# Patient Record
Sex: Female | Born: 1980 | Race: Black or African American | Hispanic: No | Marital: Married | Smoking: Never smoker
Health system: Southern US, Community
[De-identification: ages and names within clinical notes are randomized; demographics above are authoritative.]

## PROBLEM LIST (undated history)

## (undated) DIAGNOSIS — N912 Amenorrhea, unspecified: Secondary | ICD-10-CM

## (undated) DIAGNOSIS — G43909 Migraine, unspecified, not intractable, without status migrainosus: Secondary | ICD-10-CM

## (undated) DIAGNOSIS — L0293 Carbuncle, unspecified: Secondary | ICD-10-CM

## (undated) DIAGNOSIS — E785 Hyperlipidemia, unspecified: Secondary | ICD-10-CM

## (undated) DIAGNOSIS — Z79899 Other long term (current) drug therapy: Secondary | ICD-10-CM

## (undated) DIAGNOSIS — L0292 Furuncle, unspecified: Secondary | ICD-10-CM

## (undated) DIAGNOSIS — I10 Essential (primary) hypertension: Secondary | ICD-10-CM

## (undated) DIAGNOSIS — R030 Elevated blood-pressure reading, without diagnosis of hypertension: Secondary | ICD-10-CM

## (undated) DIAGNOSIS — R51 Headache: Secondary | ICD-10-CM

## (undated) DIAGNOSIS — Z0000 Encounter for general adult medical examination without abnormal findings: Secondary | ICD-10-CM

## (undated) DIAGNOSIS — B20 Human immunodeficiency virus [HIV] disease: Secondary | ICD-10-CM

## (undated) DIAGNOSIS — A539 Syphilis, unspecified: Secondary | ICD-10-CM

## (undated) DIAGNOSIS — Z124 Encounter for screening for malignant neoplasm of cervix: Secondary | ICD-10-CM

## (undated) DIAGNOSIS — R7303 Prediabetes: Secondary | ICD-10-CM

## (undated) HISTORY — DX: Essential (primary) hypertension: I10

## (undated) HISTORY — DX: Encounter for general adult medical examination without abnormal findings: Z00.00

## (undated) HISTORY — DX: Encounter for screening for malignant neoplasm of cervix: Z12.4

## (undated) HISTORY — DX: Amenorrhea, unspecified: N91.2

## (undated) HISTORY — DX: Prediabetes: R73.03

## (undated) HISTORY — DX: Other long term (current) drug therapy: Z79.899

## (undated) HISTORY — DX: Syphilis, unspecified: A53.9

## (undated) HISTORY — DX: Migraine, unspecified, not intractable, without status migrainosus: G43.909

## (undated) HISTORY — DX: Furuncle, unspecified: L02.92

## (undated) HISTORY — DX: Furuncle, unspecified: L02.93

## (undated) HISTORY — PX: NO PAST SURGERIES: SHX2092

## (undated) HISTORY — DX: Hyperlipidemia, unspecified: E78.5

## (undated) HISTORY — DX: Headache: R51

## (undated) HISTORY — DX: Human immunodeficiency virus (HIV) disease: B20

## (undated) HISTORY — DX: Elevated blood-pressure reading, without diagnosis of hypertension: R03.0

---

## 2003-06-11 ENCOUNTER — Emergency Department (HOSPITAL_COMMUNITY): Admission: EM | Admit: 2003-06-11 | Discharge: 2003-06-12 | Payer: Self-pay | Admitting: Emergency Medicine

## 2003-06-14 ENCOUNTER — Emergency Department (HOSPITAL_COMMUNITY): Admission: AD | Admit: 2003-06-14 | Discharge: 2003-06-14 | Payer: Self-pay | Admitting: Family Medicine

## 2004-03-28 ENCOUNTER — Ambulatory Visit: Payer: Self-pay | Admitting: Family Medicine

## 2004-03-29 ENCOUNTER — Ambulatory Visit: Payer: Self-pay | Admitting: Family Medicine

## 2004-04-09 ENCOUNTER — Ambulatory Visit: Payer: Self-pay | Admitting: Internal Medicine

## 2004-04-16 ENCOUNTER — Ambulatory Visit: Payer: Self-pay | Admitting: Internal Medicine

## 2004-06-24 ENCOUNTER — Ambulatory Visit: Payer: Self-pay | Admitting: Family Medicine

## 2004-07-04 ENCOUNTER — Ambulatory Visit: Payer: Self-pay | Admitting: Family Medicine

## 2004-08-08 ENCOUNTER — Ambulatory Visit: Payer: Self-pay | Admitting: *Deleted

## 2004-10-03 ENCOUNTER — Ambulatory Visit: Payer: Self-pay | Admitting: Family Medicine

## 2004-10-28 ENCOUNTER — Ambulatory Visit: Payer: Self-pay | Admitting: Family Medicine

## 2004-11-07 ENCOUNTER — Ambulatory Visit: Payer: Self-pay | Admitting: Family Medicine

## 2005-02-11 IMAGING — CT CT ABDOMEN W/O CM
1 series · 16 of 32 positions shown, 20 images · non-contrast
Comparison: none

CLINICAL DATA: Abdominal and pelvic pain. Flank pain.  Hematuria.  Nausea and vomiting.
 CT ABDOMEN AND PELVIS WITHOUT CONTRAST
TECHNIQUE: Multidetector helical CT scanning obtained from the upper abdomen through the remainder of the abdomen and pelvis.

[Series 2: renal stone · axial · 0.78mm/px · z∈[-380,-30]mm · 16 of 77 slices shown, 20 images]
[im 5/77  soft-tissue]
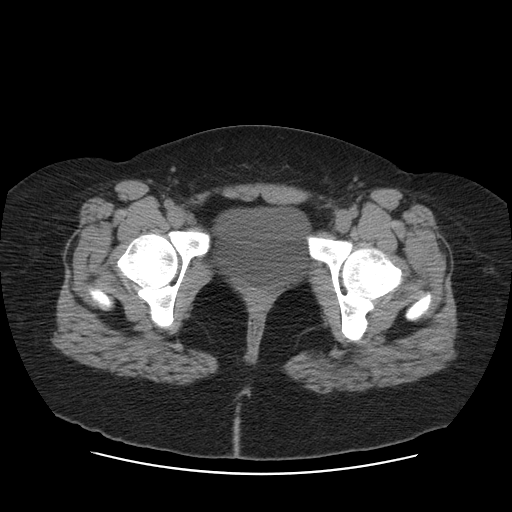
[im 5/77  bone]
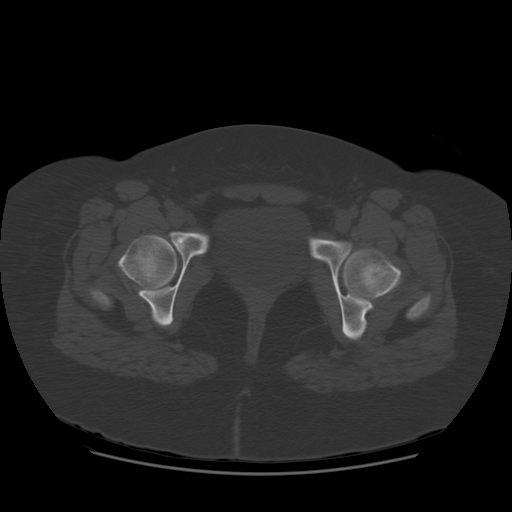
[im 10/77  soft-tissue]
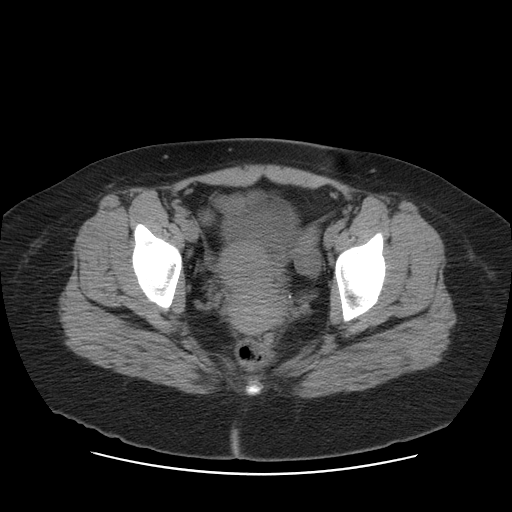
[im 15/77  soft-tissue]
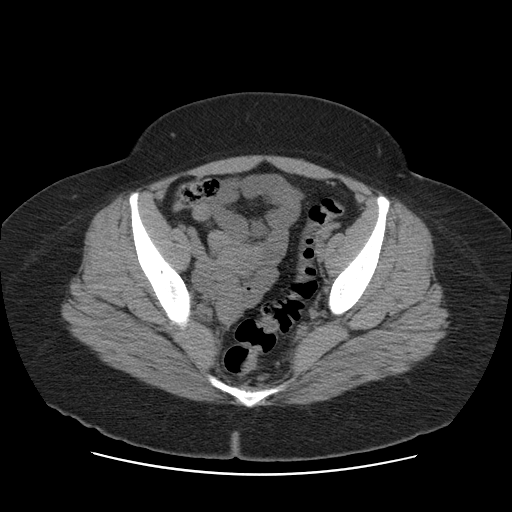
[im 20/77  soft-tissue]
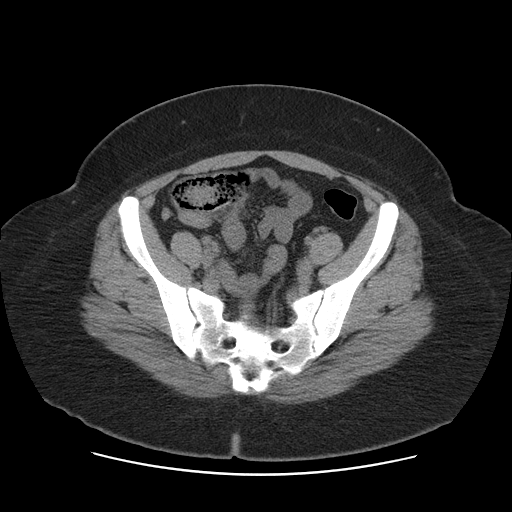
[im 25/77  soft-tissue]
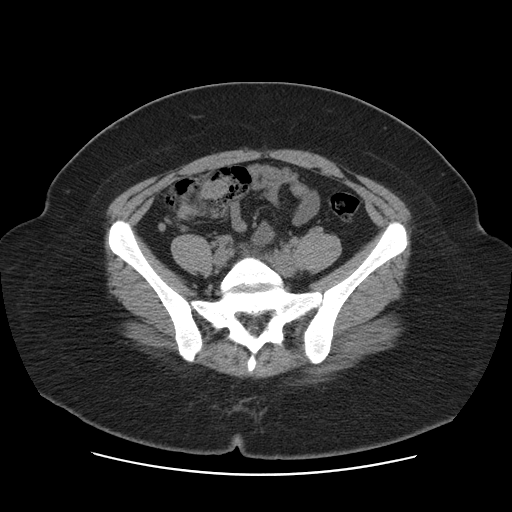
[im 30/77  soft-tissue]
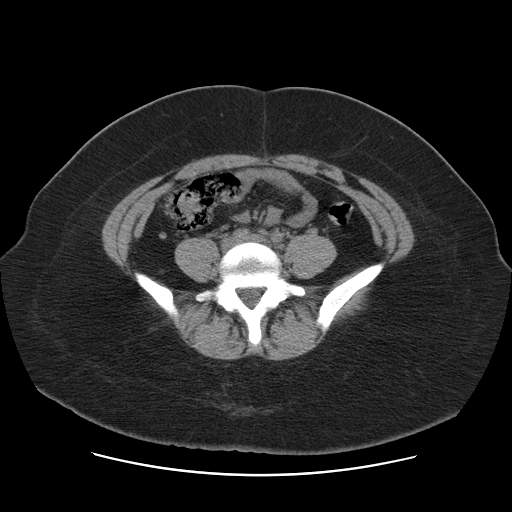
[im 35/77  soft-tissue]
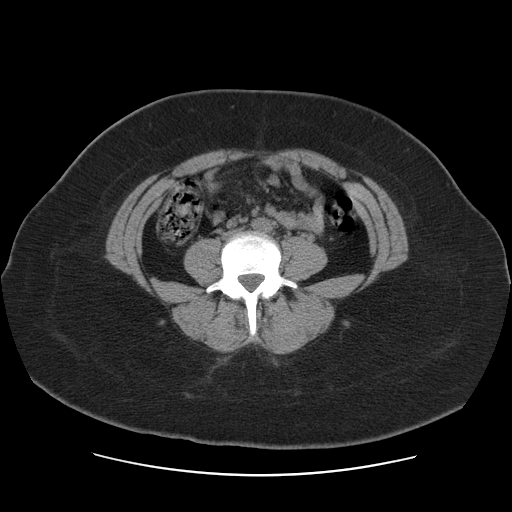
[im 42/77  soft-tissue]
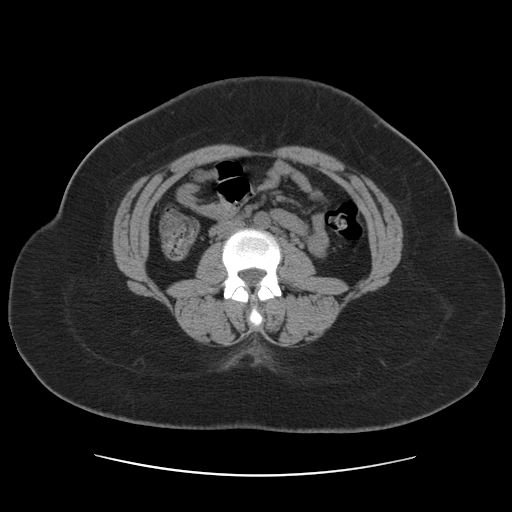
[im 47/77  soft-tissue]
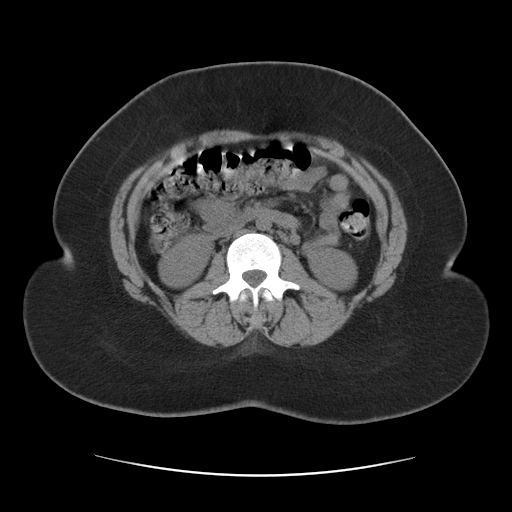
[im 47/77  bone]
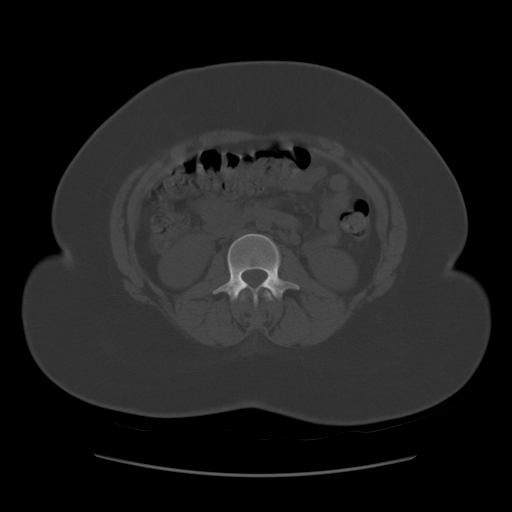
[im 52/77  soft-tissue]
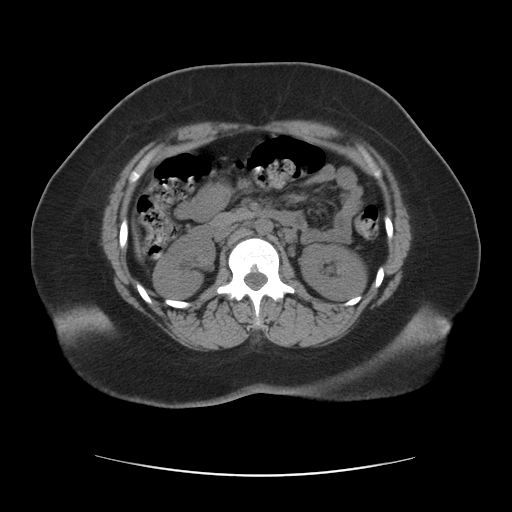
[im 57/77  soft-tissue]
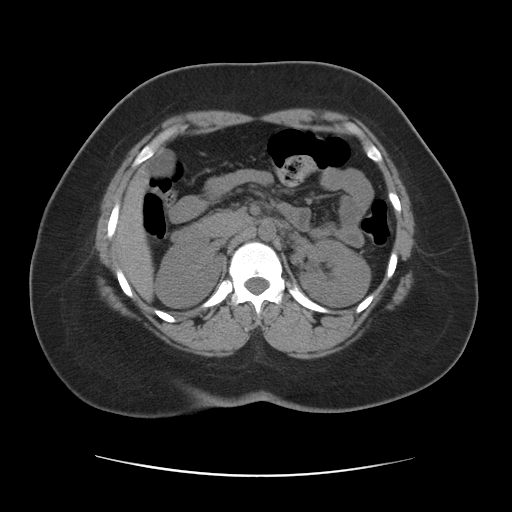
[im 62/77  soft-tissue]
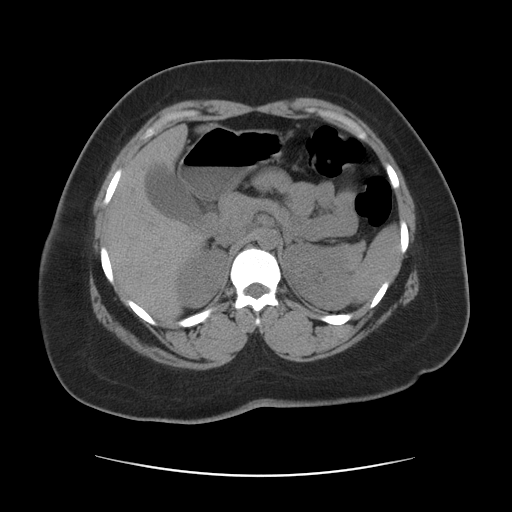
[im 67/77  soft-tissue]
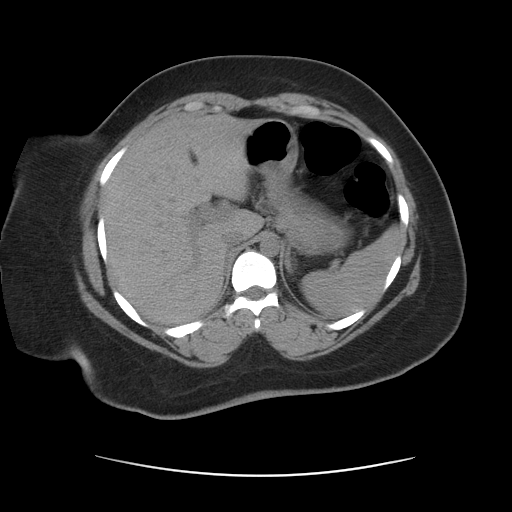
[im 67/77  lung]
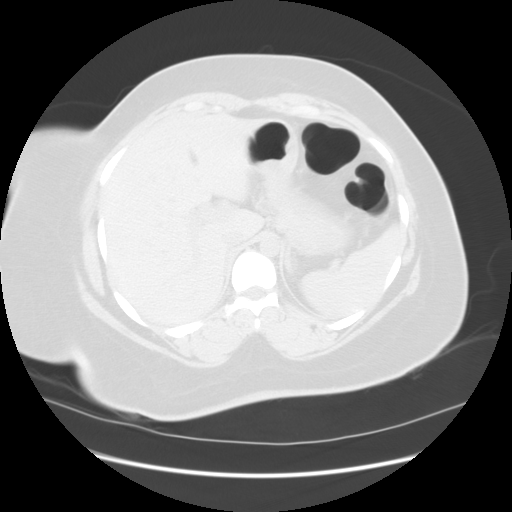
[im 69/77  lung]
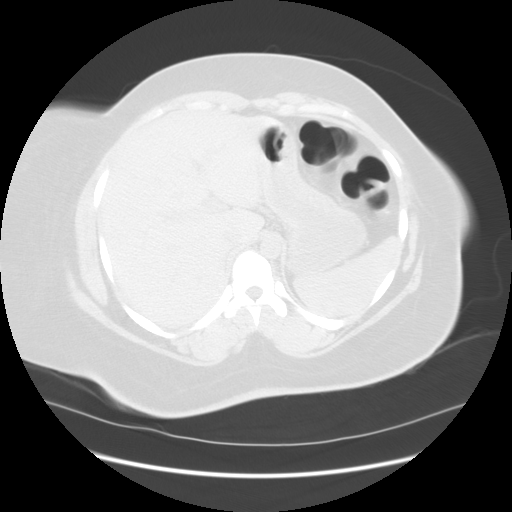
[im 72/77  soft-tissue]
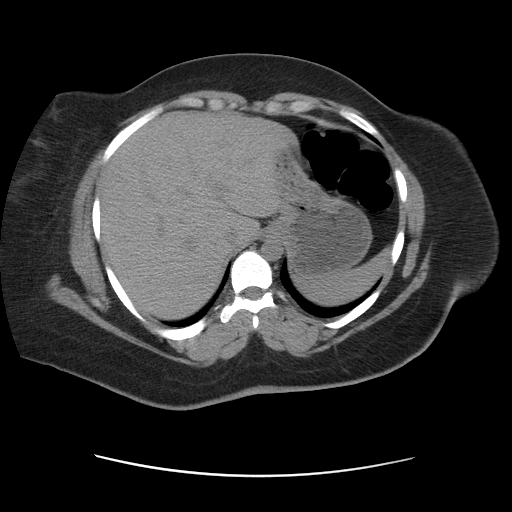
[im 72/77  lung]
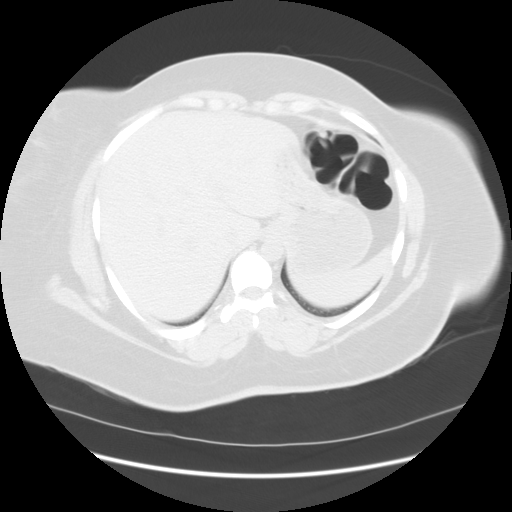
[im 74/77  lung]
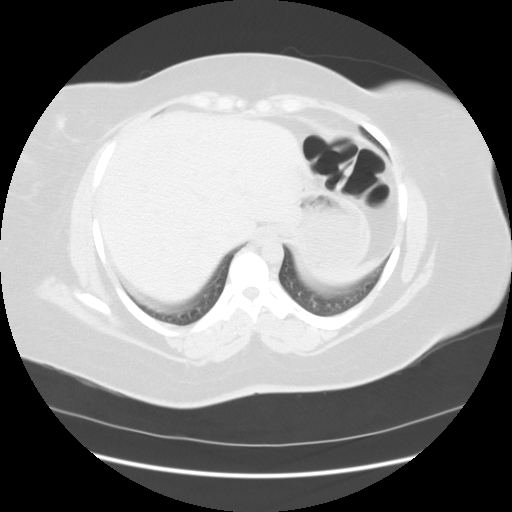

[16 of 32 positions shown; findings below may reference images not displayed]

FINDINGS: CT SCAN OF THE ABDOMEN
 The visualized liver and spleen are unremarkable.  Gallbladder, pancreas, and adrenal glands are unremarkable.  Kidneys are unremarkable.  Please note that parenchymal lesions within the solid viscera may be missed as intravenous contrast was not administered.  There is no evidence of free fluid, enlarged lymph nodes or abdominal aortic aneurysm.  Visualized bowel unremarkable.
 IMPRESSION
 No acute abnormality.
 CT SCAN OF THE PELVIS
 No free fluid, enlarged lymph nodes or abdominal aortic aneurysm.  No definite urinary calculi.
 IMPRESSION
 No acute abnormality.

## 2005-03-20 ENCOUNTER — Ambulatory Visit: Payer: Self-pay | Admitting: Family Medicine

## 2006-01-28 ENCOUNTER — Emergency Department (HOSPITAL_COMMUNITY): Admission: EM | Admit: 2006-01-28 | Discharge: 2006-01-28 | Payer: Self-pay | Admitting: Emergency Medicine

## 2006-02-07 ENCOUNTER — Emergency Department (HOSPITAL_COMMUNITY): Admission: EM | Admit: 2006-02-07 | Discharge: 2006-02-08 | Payer: Self-pay | Admitting: Emergency Medicine

## 2006-02-12 ENCOUNTER — Ambulatory Visit: Payer: Self-pay | Admitting: Family Medicine

## 2006-03-10 ENCOUNTER — Emergency Department (HOSPITAL_COMMUNITY): Admission: EM | Admit: 2006-03-10 | Discharge: 2006-03-11 | Payer: Self-pay | Admitting: Emergency Medicine

## 2006-06-15 ENCOUNTER — Emergency Department (HOSPITAL_COMMUNITY): Admission: EM | Admit: 2006-06-15 | Discharge: 2006-06-15 | Payer: Self-pay | Admitting: Family Medicine

## 2006-07-14 DIAGNOSIS — B20 Human immunodeficiency virus [HIV] disease: Secondary | ICD-10-CM

## 2006-07-14 DIAGNOSIS — Z21 Asymptomatic human immunodeficiency virus [HIV] infection status: Secondary | ICD-10-CM

## 2006-07-14 HISTORY — DX: Human immunodeficiency virus (HIV) disease: B20

## 2006-07-14 HISTORY — DX: Asymptomatic human immunodeficiency virus (hiv) infection status: Z21

## 2006-08-27 ENCOUNTER — Ambulatory Visit: Payer: Self-pay | Admitting: Internal Medicine

## 2006-08-27 ENCOUNTER — Encounter: Admission: RE | Admit: 2006-08-27 | Discharge: 2006-08-27 | Payer: Self-pay | Admitting: Internal Medicine

## 2006-08-27 LAB — CONVERTED CEMR LAB
ALT: 24 units/L (ref 0–35)
AST: 25 units/L (ref 0–37)
Albumin: 4 g/dL (ref 3.5–5.2)
Alkaline Phosphatase: 64 units/L (ref 39–117)
BUN: 12 mg/dL (ref 6–23)
Basophils Absolute: 0 10*3/uL (ref 0.0–0.1)
Basophils Relative: 1 % (ref 0–1)
Bilirubin Urine: NEGATIVE
CO2: 26 meq/L (ref 19–32)
Calcium: 9.3 mg/dL (ref 8.4–10.5)
Chlamydia, Swab/Urine, PCR: NEGATIVE
Chloride: 103 meq/L (ref 96–112)
Creatinine, Ser: 0.69 mg/dL (ref 0.40–1.20)
Eosinophils Absolute: 0 10*3/uL (ref 0.0–0.7)
Eosinophils Relative: 1 % (ref 0–5)
GC Probe Amp, Urine: NEGATIVE
Glucose, Bld: 75 mg/dL (ref 70–99)
HCT: 37.2 % (ref 36.0–46.0)
HCV Ab: NEGATIVE
HIV 1 RNA Quant: 75600 copies/mL — ABNORMAL HIGH (ref <50)
HIV-1 RNA Quant, Log: 4.88 — ABNORMAL HIGH (ref <1.70)
HIV-1 antibody: POSITIVE — AB
HIV-2 Ab: UNDETERMINED — AB
HIV: REACTIVE
Hemoglobin, Urine: NEGATIVE
Hemoglobin: 11.9 g/dL — ABNORMAL LOW (ref 12.0–15.0)
Hep B Core Total Ab: NEGATIVE
Hep B S Ab: NEGATIVE
Hepatitis B Surface Ag: NEGATIVE
Ketones, ur: NEGATIVE mg/dL
Leukocytes, UA: NEGATIVE
Lymphocytes Relative: 54 % — ABNORMAL HIGH (ref 12–46)
Lymphs Abs: 1.5 10*3/uL (ref 0.7–3.3)
MCHC: 32 g/dL (ref 30.0–36.0)
MCV: 89.9 fL (ref 78.0–100.0)
Monocytes Absolute: 0.3 10*3/uL (ref 0.2–0.7)
Monocytes Relative: 10 % (ref 3–11)
Neutro Abs: 0.9 10*3/uL — ABNORMAL LOW (ref 1.7–7.7)
Neutrophils Relative %: 35 % — ABNORMAL LOW (ref 43–77)
Nitrite: NEGATIVE
Platelets: 247 10*3/uL (ref 150–400)
Potassium: 4 meq/L (ref 3.5–5.3)
Protein, ur: 30 mg/dL — AB
RBC: 4.14 M/uL (ref 3.87–5.11)
RDW: 13.4 % (ref 11.5–14.0)
Sodium: 136 meq/L (ref 135–145)
Specific Gravity, Urine: 1.016 (ref 1.005–1.03)
Total Bilirubin: 0.3 mg/dL (ref 0.3–1.2)
Total Protein: 7.8 g/dL (ref 6.0–8.3)
Urine Glucose: NEGATIVE mg/dL
Urobilinogen, UA: 0.2 (ref 0.0–1.0)
WBC: 2.7 10*3/uL — ABNORMAL LOW (ref 4.0–10.5)
pH: 6.5 (ref 5.0–8.0)

## 2006-08-29 ENCOUNTER — Emergency Department (HOSPITAL_COMMUNITY): Admission: EM | Admit: 2006-08-29 | Discharge: 2006-08-29 | Payer: Self-pay | Admitting: Family Medicine

## 2006-09-03 DIAGNOSIS — B20 Human immunodeficiency virus [HIV] disease: Secondary | ICD-10-CM

## 2006-09-11 ENCOUNTER — Telehealth (INDEPENDENT_AMBULATORY_CARE_PROVIDER_SITE_OTHER): Payer: Self-pay | Admitting: *Deleted

## 2006-09-11 ENCOUNTER — Encounter (INDEPENDENT_AMBULATORY_CARE_PROVIDER_SITE_OTHER): Payer: Self-pay | Admitting: *Deleted

## 2006-09-11 ENCOUNTER — Ambulatory Visit: Payer: Self-pay | Admitting: Internal Medicine

## 2006-09-11 DIAGNOSIS — A539 Syphilis, unspecified: Secondary | ICD-10-CM

## 2006-09-11 LAB — CONVERTED CEMR LAB
HCV Ab: NEGATIVE
Hep B S Ab: NEGATIVE
Hepatitis B Surface Ag: NEGATIVE

## 2006-09-15 ENCOUNTER — Telehealth: Payer: Self-pay

## 2006-10-06 ENCOUNTER — Telehealth: Payer: Self-pay | Admitting: Internal Medicine

## 2006-10-12 ENCOUNTER — Encounter: Admission: RE | Admit: 2006-10-12 | Discharge: 2006-10-12 | Payer: Self-pay | Admitting: Internal Medicine

## 2006-10-12 ENCOUNTER — Ambulatory Visit: Payer: Self-pay | Admitting: Internal Medicine

## 2006-11-03 ENCOUNTER — Telehealth: Payer: Self-pay | Admitting: Internal Medicine

## 2006-11-03 ENCOUNTER — Ambulatory Visit: Payer: Self-pay | Admitting: Internal Medicine

## 2006-11-25 ENCOUNTER — Telehealth: Payer: Self-pay | Admitting: Internal Medicine

## 2006-12-23 ENCOUNTER — Ambulatory Visit: Payer: Self-pay | Admitting: Internal Medicine

## 2006-12-23 ENCOUNTER — Encounter: Admission: RE | Admit: 2006-12-23 | Discharge: 2006-12-23 | Payer: Self-pay | Admitting: Internal Medicine

## 2006-12-23 ENCOUNTER — Telehealth: Payer: Self-pay | Admitting: Internal Medicine

## 2006-12-28 ENCOUNTER — Encounter: Payer: Self-pay | Admitting: Internal Medicine

## 2007-01-04 ENCOUNTER — Encounter (INDEPENDENT_AMBULATORY_CARE_PROVIDER_SITE_OTHER): Payer: Self-pay | Admitting: *Deleted

## 2007-01-05 ENCOUNTER — Encounter (INDEPENDENT_AMBULATORY_CARE_PROVIDER_SITE_OTHER): Payer: Self-pay | Admitting: *Deleted

## 2007-01-07 ENCOUNTER — Telehealth: Payer: Self-pay | Admitting: Internal Medicine

## 2007-01-13 ENCOUNTER — Ambulatory Visit: Payer: Self-pay | Admitting: Internal Medicine

## 2007-02-02 ENCOUNTER — Telehealth: Payer: Self-pay | Admitting: Internal Medicine

## 2007-03-03 ENCOUNTER — Telehealth: Payer: Self-pay | Admitting: Internal Medicine

## 2007-04-06 ENCOUNTER — Telehealth: Payer: Self-pay | Admitting: Internal Medicine

## 2007-05-04 ENCOUNTER — Telehealth (INDEPENDENT_AMBULATORY_CARE_PROVIDER_SITE_OTHER): Payer: Self-pay | Admitting: *Deleted

## 2007-05-04 ENCOUNTER — Telehealth: Payer: Self-pay | Admitting: Internal Medicine

## 2007-06-04 ENCOUNTER — Telehealth: Payer: Self-pay | Admitting: Internal Medicine

## 2007-06-16 ENCOUNTER — Encounter: Admission: RE | Admit: 2007-06-16 | Discharge: 2007-06-16 | Payer: Self-pay | Admitting: Internal Medicine

## 2007-06-16 ENCOUNTER — Ambulatory Visit: Payer: Self-pay | Admitting: Internal Medicine

## 2007-06-16 ENCOUNTER — Encounter (INDEPENDENT_AMBULATORY_CARE_PROVIDER_SITE_OTHER): Payer: Self-pay | Admitting: *Deleted

## 2007-06-16 LAB — CONVERTED CEMR LAB
ALT: 28 units/L (ref 0–35)
AST: 25 units/L (ref 0–37)
Albumin: 4.2 g/dL (ref 3.5–5.2)
Alkaline Phosphatase: 127 units/L — ABNORMAL HIGH (ref 39–117)
BUN: 13 mg/dL (ref 6–23)
Basophils Absolute: 0 10*3/uL (ref 0.0–0.1)
Basophils Relative: 1 % (ref 0–1)
CO2: 26 meq/L (ref 19–32)
Calcium: 9.5 mg/dL (ref 8.4–10.5)
Chloride: 103 meq/L (ref 96–112)
Creatinine, Ser: 0.78 mg/dL (ref 0.40–1.20)
Eosinophils Absolute: 0.1 10*3/uL — ABNORMAL LOW (ref 0.2–0.7)
Eosinophils Relative: 3 % (ref 0–5)
Glucose, Bld: 85 mg/dL (ref 70–99)
HCT: 37.1 % (ref 36.0–46.0)
HIV 1 RNA Quant: 97 copies/mL — ABNORMAL HIGH (ref <50)
HIV-1 RNA Quant, Log: 1.99 — ABNORMAL HIGH (ref <1.70)
Hemoglobin: 12.1 g/dL (ref 12.0–15.0)
Lymphocytes Relative: 45 % (ref 12–46)
Lymphs Abs: 2 10*3/uL (ref 0.7–4.0)
MCHC: 32.6 g/dL (ref 30.0–36.0)
MCV: 90.9 fL (ref 78.0–100.0)
Monocytes Absolute: 0.3 10*3/uL (ref 0.1–1.0)
Monocytes Relative: 7 % (ref 3–12)
Neutro Abs: 2 10*3/uL (ref 1.7–7.7)
Neutrophils Relative %: 44 % (ref 43–77)
Platelets: 335 10*3/uL (ref 150–400)
Potassium: 4.1 meq/L (ref 3.5–5.3)
RBC: 4.08 M/uL (ref 3.87–5.11)
RDW: 13.1 % (ref 11.5–15.5)
Sodium: 140 meq/L (ref 135–145)
Total Bilirubin: 0.2 mg/dL — ABNORMAL LOW (ref 0.3–1.2)
Total Protein: 7.8 g/dL (ref 6.0–8.3)
WBC: 4.4 10*3/uL (ref 4.0–10.5)

## 2007-06-21 ENCOUNTER — Telehealth: Payer: Self-pay | Admitting: Internal Medicine

## 2007-07-05 ENCOUNTER — Telehealth: Payer: Self-pay | Admitting: Internal Medicine

## 2007-07-12 ENCOUNTER — Encounter: Payer: Self-pay | Admitting: Internal Medicine

## 2007-07-14 ENCOUNTER — Telehealth: Payer: Self-pay | Admitting: Internal Medicine

## 2007-07-14 ENCOUNTER — Ambulatory Visit: Payer: Self-pay | Admitting: Internal Medicine

## 2007-07-21 ENCOUNTER — Telehealth: Payer: Self-pay | Admitting: Internal Medicine

## 2007-08-12 ENCOUNTER — Telehealth: Payer: Self-pay | Admitting: Internal Medicine

## 2007-08-16 ENCOUNTER — Ambulatory Visit: Payer: Self-pay | Admitting: Internal Medicine

## 2007-08-16 ENCOUNTER — Encounter: Admission: RE | Admit: 2007-08-16 | Discharge: 2007-08-16 | Payer: Self-pay | Admitting: Internal Medicine

## 2007-08-16 LAB — CONVERTED CEMR LAB
ALT: 10 units/L (ref 0–35)
AST: 13 units/L (ref 0–37)
Albumin: 4.2 g/dL (ref 3.5–5.2)
Alkaline Phosphatase: 94 units/L (ref 39–117)
BUN: 11 mg/dL (ref 6–23)
Basophils Absolute: 0 10*3/uL (ref 0.0–0.1)
Basophils Relative: 1 % (ref 0–1)
CO2: 24 meq/L (ref 19–32)
Calcium: 9.6 mg/dL (ref 8.4–10.5)
Chloride: 104 meq/L (ref 96–112)
Creatinine, Ser: 0.82 mg/dL (ref 0.40–1.20)
Eosinophils Absolute: 0.1 10*3/uL (ref 0.0–0.7)
Eosinophils Relative: 1 % (ref 0–5)
Glucose, Bld: 91 mg/dL (ref 70–99)
HCT: 34.7 % — ABNORMAL LOW (ref 36.0–46.0)
HIV 1 RNA Quant: 75 copies/mL — ABNORMAL HIGH (ref <50)
HIV-1 RNA Quant, Log: 1.88 — ABNORMAL HIGH (ref <1.70)
Hemoglobin: 11.5 g/dL — ABNORMAL LOW (ref 12.0–15.0)
Lymphocytes Relative: 38 % (ref 12–46)
Lymphs Abs: 2.2 10*3/uL (ref 0.7–4.0)
MCHC: 33.1 g/dL (ref 30.0–36.0)
MCV: 92.5 fL (ref 78.0–100.0)
Monocytes Absolute: 0.4 10*3/uL (ref 0.1–1.0)
Monocytes Relative: 7 % (ref 3–12)
Neutro Abs: 3.1 10*3/uL (ref 1.7–7.7)
Neutrophils Relative %: 53 % (ref 43–77)
Platelets: 354 10*3/uL (ref 150–400)
Potassium: 3.9 meq/L (ref 3.5–5.3)
RBC: 3.75 M/uL — ABNORMAL LOW (ref 3.87–5.11)
RDW: 14.5 % (ref 11.5–15.5)
Sodium: 139 meq/L (ref 135–145)
Total Bilirubin: 0.5 mg/dL (ref 0.3–1.2)
Total Protein: 7.6 g/dL (ref 6.0–8.3)
WBC: 5.8 10*3/uL (ref 4.0–10.5)

## 2007-08-24 ENCOUNTER — Telehealth: Payer: Self-pay | Admitting: Internal Medicine

## 2007-08-26 ENCOUNTER — Encounter (INDEPENDENT_AMBULATORY_CARE_PROVIDER_SITE_OTHER): Payer: Self-pay | Admitting: *Deleted

## 2007-08-31 ENCOUNTER — Encounter (INDEPENDENT_AMBULATORY_CARE_PROVIDER_SITE_OTHER): Payer: Self-pay | Admitting: *Deleted

## 2007-08-31 ENCOUNTER — Ambulatory Visit: Payer: Self-pay | Admitting: Internal Medicine

## 2007-08-31 DIAGNOSIS — N912 Amenorrhea, unspecified: Secondary | ICD-10-CM

## 2007-08-31 DIAGNOSIS — R51 Headache: Secondary | ICD-10-CM

## 2007-08-31 DIAGNOSIS — R519 Headache, unspecified: Secondary | ICD-10-CM | POA: Insufficient documentation

## 2007-08-31 DIAGNOSIS — L0293 Carbuncle, unspecified: Secondary | ICD-10-CM

## 2007-08-31 DIAGNOSIS — L0292 Furuncle, unspecified: Secondary | ICD-10-CM | POA: Insufficient documentation

## 2007-08-31 DIAGNOSIS — R030 Elevated blood-pressure reading, without diagnosis of hypertension: Secondary | ICD-10-CM

## 2007-09-21 ENCOUNTER — Encounter: Payer: Self-pay | Admitting: Internal Medicine

## 2007-09-21 ENCOUNTER — Encounter (INDEPENDENT_AMBULATORY_CARE_PROVIDER_SITE_OTHER): Payer: Self-pay | Admitting: *Deleted

## 2007-09-21 ENCOUNTER — Ambulatory Visit: Payer: Self-pay | Admitting: Internal Medicine

## 2007-09-21 LAB — CONVERTED CEMR LAB: Pap Smear: NORMAL

## 2007-09-27 ENCOUNTER — Encounter: Payer: Self-pay | Admitting: Internal Medicine

## 2007-10-01 ENCOUNTER — Telehealth: Payer: Self-pay | Admitting: Internal Medicine

## 2007-10-29 ENCOUNTER — Telehealth (INDEPENDENT_AMBULATORY_CARE_PROVIDER_SITE_OTHER): Payer: Self-pay | Admitting: *Deleted

## 2007-11-18 ENCOUNTER — Emergency Department (HOSPITAL_COMMUNITY): Admission: EM | Admit: 2007-11-18 | Discharge: 2007-11-18 | Payer: Self-pay | Admitting: Emergency Medicine

## 2007-11-19 ENCOUNTER — Telehealth: Payer: Self-pay | Admitting: Internal Medicine

## 2007-12-08 ENCOUNTER — Telehealth (INDEPENDENT_AMBULATORY_CARE_PROVIDER_SITE_OTHER): Payer: Self-pay | Admitting: *Deleted

## 2008-01-11 ENCOUNTER — Telehealth (INDEPENDENT_AMBULATORY_CARE_PROVIDER_SITE_OTHER): Payer: Self-pay | Admitting: *Deleted

## 2008-01-20 ENCOUNTER — Encounter: Admission: RE | Admit: 2008-01-20 | Discharge: 2008-01-20 | Payer: Self-pay | Admitting: Internal Medicine

## 2008-01-20 ENCOUNTER — Ambulatory Visit: Payer: Self-pay | Admitting: Internal Medicine

## 2008-01-20 LAB — CONVERTED CEMR LAB
ALT: 10 units/L (ref 0–35)
AST: 13 units/L (ref 0–37)
Albumin: 4.1 g/dL (ref 3.5–5.2)
Alkaline Phosphatase: 86 units/L (ref 39–117)
BUN: 18 mg/dL (ref 6–23)
Basophils Absolute: 0 10*3/uL (ref 0.0–0.1)
Basophils Relative: 0 % (ref 0–1)
CO2: 23 meq/L (ref 19–32)
Calcium: 9.2 mg/dL (ref 8.4–10.5)
Chloride: 102 meq/L (ref 96–112)
Creatinine, Ser: 1.16 mg/dL (ref 0.40–1.20)
Eosinophils Absolute: 0 10*3/uL (ref 0.0–0.7)
Eosinophils Relative: 1 % (ref 0–5)
Glucose, Bld: 86 mg/dL (ref 70–99)
HCT: 36 % (ref 36.0–46.0)
HIV 1 RNA Quant: <50 copies/mL (ref <50)
HIV-1 RNA Quant, Log: <1.7 (ref <1.70)
Hemoglobin: 12.1 g/dL (ref 12.0–15.0)
Lymphocytes Relative: 40 % (ref 12–46)
Lymphs Abs: 2.4 10*3/uL (ref 0.7–4.0)
MCHC: 33.6 g/dL (ref 30.0–36.0)
MCV: 102.3 fL — ABNORMAL HIGH (ref 78.0–100.0)
Monocytes Absolute: 0.4 10*3/uL (ref 0.1–1.0)
Monocytes Relative: 7 % (ref 3–12)
Neutro Abs: 3.1 10*3/uL (ref 1.7–7.7)
Neutrophils Relative %: 52 % (ref 43–77)
Platelets: 318 10*3/uL (ref 150–400)
Potassium: 4.2 meq/L (ref 3.5–5.3)
RBC: 3.52 M/uL — ABNORMAL LOW (ref 3.87–5.11)
RDW: 13.5 % (ref 11.5–15.5)
Sodium: 138 meq/L (ref 135–145)
Total Bilirubin: 0.5 mg/dL (ref 0.3–1.2)
Total Protein: 7.5 g/dL (ref 6.0–8.3)
WBC: 6.1 10*3/uL (ref 4.0–10.5)

## 2008-02-01 ENCOUNTER — Ambulatory Visit: Payer: Self-pay | Admitting: Internal Medicine

## 2008-02-14 ENCOUNTER — Telehealth (INDEPENDENT_AMBULATORY_CARE_PROVIDER_SITE_OTHER): Payer: Self-pay | Admitting: *Deleted

## 2008-02-27 ENCOUNTER — Emergency Department (HOSPITAL_COMMUNITY): Admission: EM | Admit: 2008-02-27 | Discharge: 2008-02-27 | Payer: Self-pay | Admitting: Family Medicine

## 2008-03-22 ENCOUNTER — Ambulatory Visit: Payer: Self-pay | Admitting: Infectious Diseases

## 2008-03-22 ENCOUNTER — Encounter: Payer: Self-pay | Admitting: Internal Medicine

## 2008-03-22 ENCOUNTER — Telehealth: Payer: Self-pay | Admitting: Internal Medicine

## 2008-03-22 LAB — CONVERTED CEMR LAB: Beta hcg, urine, semiquantitative: NEGATIVE

## 2008-03-27 ENCOUNTER — Telehealth (INDEPENDENT_AMBULATORY_CARE_PROVIDER_SITE_OTHER): Payer: Self-pay | Admitting: *Deleted

## 2008-04-25 ENCOUNTER — Ambulatory Visit: Payer: Self-pay | Admitting: Internal Medicine

## 2008-04-25 LAB — CONVERTED CEMR LAB
ALT: 15 units/L (ref 0–35)
AST: 15 units/L (ref 0–37)
Albumin: 4 g/dL (ref 3.5–5.2)
Alkaline Phosphatase: 77 units/L (ref 39–117)
BUN: 14 mg/dL (ref 6–23)
Basophils Absolute: 0 10*3/uL (ref 0.0–0.1)
Basophils Relative: 0 % (ref 0–1)
CO2: 22 meq/L (ref 19–32)
Calcium: 9.3 mg/dL (ref 8.4–10.5)
Chloride: 103 meq/L (ref 96–112)
Creatinine, Ser: 0.81 mg/dL (ref 0.40–1.20)
Eosinophils Absolute: 0.1 10*3/uL (ref 0.0–0.7)
Eosinophils Relative: 1 % (ref 0–5)
Glucose, Bld: 79 mg/dL (ref 70–99)
HCT: 35.7 % — ABNORMAL LOW (ref 36.0–46.0)
HIV 1 RNA Quant: <50 copies/mL (ref <50)
HIV-1 RNA Quant, Log: <1.7 (ref <1.70)
Hemoglobin: 12 g/dL (ref 12.0–15.0)
Lymphocytes Relative: 46 % (ref 12–46)
Lymphs Abs: 2.7 10*3/uL (ref 0.7–4.0)
MCHC: 33.6 g/dL (ref 30.0–36.0)
MCV: 100.3 fL — ABNORMAL HIGH (ref 78.0–100.0)
Monocytes Absolute: 0.4 10*3/uL (ref 0.1–1.0)
Monocytes Relative: 7 % (ref 3–12)
Neutro Abs: 2.7 10*3/uL (ref 1.7–7.7)
Neutrophils Relative %: 46 % (ref 43–77)
Platelets: 381 10*3/uL (ref 150–400)
Potassium: 4 meq/L (ref 3.5–5.3)
RBC: 3.56 M/uL — ABNORMAL LOW (ref 3.87–5.11)
RDW: 12.7 % (ref 11.5–15.5)
Sodium: 138 meq/L (ref 135–145)
Total Bilirubin: 0.4 mg/dL (ref 0.3–1.2)
Total Protein: 7.9 g/dL (ref 6.0–8.3)
WBC: 5.8 10*3/uL (ref 4.0–10.5)

## 2008-05-09 ENCOUNTER — Telehealth: Payer: Self-pay | Admitting: Internal Medicine

## 2008-05-16 ENCOUNTER — Ambulatory Visit: Payer: Self-pay | Admitting: Internal Medicine

## 2008-05-30 ENCOUNTER — Encounter: Payer: Self-pay | Admitting: Internal Medicine

## 2008-06-12 ENCOUNTER — Telehealth: Payer: Self-pay | Admitting: Internal Medicine

## 2008-07-25 ENCOUNTER — Telehealth: Payer: Self-pay | Admitting: Internal Medicine

## 2008-08-24 ENCOUNTER — Ambulatory Visit: Payer: Self-pay | Admitting: Internal Medicine

## 2008-08-24 LAB — CONVERTED CEMR LAB
ALT: 11 units/L (ref 0–35)
AST: 11 units/L (ref 0–37)
Albumin: 4.2 g/dL (ref 3.5–5.2)
Alkaline Phosphatase: 76 units/L (ref 39–117)
BUN: 14 mg/dL (ref 6–23)
Basophils Absolute: 0 10*3/uL (ref 0.0–0.1)
Basophils Relative: 1 % (ref 0–1)
CO2: 23 meq/L (ref 19–32)
Calcium: 9.5 mg/dL (ref 8.4–10.5)
Chloride: 103 meq/L (ref 96–112)
Creatinine, Ser: 0.82 mg/dL (ref 0.40–1.20)
Eosinophils Absolute: 0.1 10*3/uL (ref 0.0–0.7)
Eosinophils Relative: 1 % (ref 0–5)
Glucose, Bld: 78 mg/dL (ref 70–99)
HCT: 36.5 % (ref 36.0–46.0)
HIV 1 RNA Quant: <48 copies/mL (ref <48)
HIV-1 RNA Quant, Log: <1.68 (ref <1.68)
Hemoglobin: 11.7 g/dL — ABNORMAL LOW (ref 12.0–15.0)
Lymphocytes Relative: 52 % — ABNORMAL HIGH (ref 12–46)
Lymphs Abs: 2.7 10*3/uL (ref 0.7–4.0)
MCHC: 32.1 g/dL (ref 30.0–36.0)
MCV: 102.8 fL — ABNORMAL HIGH (ref 78.0–100.0)
Monocytes Absolute: 0.3 10*3/uL (ref 0.1–1.0)
Monocytes Relative: 6 % (ref 3–12)
Neutro Abs: 2.1 10*3/uL (ref 1.7–7.7)
Neutrophils Relative %: 41 % — ABNORMAL LOW (ref 43–77)
Platelets: 343 10*3/uL (ref 150–400)
Potassium: 4 meq/L (ref 3.5–5.3)
RBC: 3.55 M/uL — ABNORMAL LOW (ref 3.87–5.11)
RDW: 13.6 % (ref 11.5–15.5)
Sodium: 138 meq/L (ref 135–145)
Total Bilirubin: 0.5 mg/dL (ref 0.3–1.2)
Total Protein: 7.6 g/dL (ref 6.0–8.3)
WBC: 5.3 10*3/uL (ref 4.0–10.5)

## 2008-09-01 ENCOUNTER — Telehealth (INDEPENDENT_AMBULATORY_CARE_PROVIDER_SITE_OTHER): Payer: Self-pay | Admitting: *Deleted

## 2008-09-08 ENCOUNTER — Ambulatory Visit: Payer: Self-pay | Admitting: Internal Medicine

## 2008-09-08 ENCOUNTER — Telehealth (INDEPENDENT_AMBULATORY_CARE_PROVIDER_SITE_OTHER): Payer: Self-pay | Admitting: *Deleted

## 2008-10-20 ENCOUNTER — Telehealth (INDEPENDENT_AMBULATORY_CARE_PROVIDER_SITE_OTHER): Payer: Self-pay | Admitting: *Deleted

## 2008-11-14 ENCOUNTER — Encounter (INDEPENDENT_AMBULATORY_CARE_PROVIDER_SITE_OTHER): Payer: Self-pay | Admitting: *Deleted

## 2008-11-15 ENCOUNTER — Ambulatory Visit: Payer: Self-pay | Admitting: Internal Medicine

## 2008-12-01 ENCOUNTER — Ambulatory Visit: Payer: Self-pay | Admitting: Internal Medicine

## 2008-12-01 ENCOUNTER — Encounter: Payer: Self-pay | Admitting: Internal Medicine

## 2008-12-04 ENCOUNTER — Telehealth (INDEPENDENT_AMBULATORY_CARE_PROVIDER_SITE_OTHER): Payer: Self-pay | Admitting: *Deleted

## 2008-12-22 ENCOUNTER — Encounter: Payer: Self-pay | Admitting: Internal Medicine

## 2009-03-27 ENCOUNTER — Telehealth (INDEPENDENT_AMBULATORY_CARE_PROVIDER_SITE_OTHER): Payer: Self-pay | Admitting: *Deleted

## 2009-05-09 ENCOUNTER — Telehealth (INDEPENDENT_AMBULATORY_CARE_PROVIDER_SITE_OTHER): Payer: Self-pay | Admitting: *Deleted

## 2009-05-09 ENCOUNTER — Ambulatory Visit: Payer: Self-pay | Admitting: Internal Medicine

## 2009-05-09 LAB — CONVERTED CEMR LAB
ALT: 23 units/L (ref 0–35)
AST: 17 units/L (ref 0–37)
Albumin: 4.1 g/dL (ref 3.5–5.2)
Alkaline Phosphatase: 67 units/L (ref 39–117)
BUN: 12 mg/dL (ref 6–23)
Basophils Absolute: 0 10*3/uL (ref 0.0–0.1)
Basophils Relative: 0 % (ref 0–1)
CO2: 25 meq/L (ref 19–32)
Calcium: 9.1 mg/dL (ref 8.4–10.5)
Chloride: 104 meq/L (ref 96–112)
Creatinine, Ser: 0.81 mg/dL (ref 0.40–1.20)
Eosinophils Absolute: 0 10*3/uL (ref 0.0–0.7)
Eosinophils Relative: 1 % (ref 0–5)
Glucose, Bld: 85 mg/dL (ref 70–99)
HCT: 38.4 % (ref 36.0–46.0)
HIV 1 RNA Quant: <48 copies/mL (ref <48)
HIV-1 RNA Quant, Log: <1.68 (ref <1.68)
Hemoglobin: 12.4 g/dL (ref 12.0–15.0)
Lymphocytes Relative: 24 % (ref 12–46)
Lymphs Abs: 1.2 10*3/uL (ref 0.7–4.0)
MCHC: 32.3 g/dL (ref 30.0–36.0)
MCV: 98.2 fL (ref >=78.0)
Monocytes Absolute: 0.2 10*3/uL (ref 0.1–1.0)
Monocytes Relative: 4 % (ref 3–12)
Neutro Abs: 3.7 10*3/uL (ref 1.7–7.7)
Neutrophils Relative %: 72 % (ref 43–77)
Platelets: 342 10*3/uL (ref 150–400)
Potassium: 3.8 meq/L (ref 3.5–5.3)
RBC: 3.91 M/uL (ref 3.87–5.11)
RDW: 14.9 % (ref 11.5–15.5)
Sodium: 139 meq/L (ref 135–145)
Total Bilirubin: 0.5 mg/dL (ref 0.3–1.2)
Total Protein: 7.5 g/dL (ref 6.0–8.3)
WBC: 5.1 10*3/uL (ref 4.0–10.5)

## 2009-05-23 ENCOUNTER — Ambulatory Visit: Payer: Self-pay | Admitting: Internal Medicine

## 2009-07-05 ENCOUNTER — Telehealth (INDEPENDENT_AMBULATORY_CARE_PROVIDER_SITE_OTHER): Payer: Self-pay | Admitting: *Deleted

## 2009-08-15 ENCOUNTER — Encounter (INDEPENDENT_AMBULATORY_CARE_PROVIDER_SITE_OTHER): Payer: Self-pay | Admitting: *Deleted

## 2009-09-13 ENCOUNTER — Ambulatory Visit: Payer: Self-pay | Admitting: Internal Medicine

## 2009-09-13 ENCOUNTER — Telehealth (INDEPENDENT_AMBULATORY_CARE_PROVIDER_SITE_OTHER): Payer: Self-pay | Admitting: *Deleted

## 2009-09-13 LAB — CONVERTED CEMR LAB
ALT: 20 units/L (ref 0–35)
AST: 21 units/L (ref 0–37)
Albumin: 3.9 g/dL (ref 3.5–5.2)
Alkaline Phosphatase: 74 units/L (ref 39–117)
BUN: 14 mg/dL (ref 6–23)
Basophils Absolute: 0 10*3/uL (ref 0.0–0.1)
Basophils Relative: 1 % (ref 0–1)
CO2: 27 meq/L (ref 19–32)
Calcium: 9.1 mg/dL (ref 8.4–10.5)
Chloride: 101 meq/L (ref 96–112)
Cholesterol: 164 mg/dL (ref 0–200)
Creatinine, Ser: 0.88 mg/dL (ref 0.40–1.20)
Eosinophils Absolute: 0.1 10*3/uL (ref 0.0–0.7)
Eosinophils Relative: 2 % (ref 0–5)
Glucose, Bld: 91 mg/dL (ref 70–99)
HCT: 38.7 % (ref 36.0–46.0)
HDL: 32 mg/dL — ABNORMAL LOW (ref >39)
HIV 1 RNA Quant: 24200 copies/mL — ABNORMAL HIGH (ref <48)
HIV-1 RNA Quant, Log: 4.38 — ABNORMAL HIGH (ref <1.68)
Hemoglobin: 12.7 g/dL (ref 12.0–15.0)
LDL Cholesterol: 100 mg/dL — ABNORMAL HIGH (ref 0–99)
Lymphocytes Relative: 65 % — ABNORMAL HIGH (ref 12–46)
Lymphs Abs: 3 10*3/uL (ref 0.7–4.0)
MCHC: 32.8 g/dL (ref 30.0–36.0)
MCV: 95.1 fL (ref >=78.0)
Monocytes Absolute: 0.1 10*3/uL (ref 0.1–1.0)
Monocytes Relative: 3 % (ref 3–12)
Neutro Abs: 1.3 10*3/uL — ABNORMAL LOW (ref 1.7–7.7)
Neutrophils Relative %: 29 % — ABNORMAL LOW (ref 43–77)
Platelets: 263 10*3/uL (ref 150–400)
Potassium: 4 meq/L (ref 3.5–5.3)
RBC: 4.07 M/uL (ref 3.87–5.11)
RDW: 13.4 % (ref 11.5–15.5)
Sodium: 137 meq/L (ref 135–145)
Total Bilirubin: 0.3 mg/dL (ref 0.3–1.2)
Total CHOL/HDL Ratio: 5.1
Total Protein: 7.3 g/dL (ref 6.0–8.3)
Triglycerides: 160 mg/dL — ABNORMAL HIGH (ref <150)
VLDL: 32 mg/dL (ref 0–40)
WBC: 4.6 10*3/uL (ref 4.0–10.5)

## 2009-09-24 ENCOUNTER — Ambulatory Visit: Payer: Self-pay | Admitting: Internal Medicine

## 2009-10-20 ENCOUNTER — Emergency Department (HOSPITAL_COMMUNITY): Admission: EM | Admit: 2009-10-20 | Discharge: 2009-10-20 | Payer: Self-pay | Admitting: Family Medicine

## 2009-10-24 ENCOUNTER — Telehealth (INDEPENDENT_AMBULATORY_CARE_PROVIDER_SITE_OTHER): Payer: Self-pay | Admitting: *Deleted

## 2009-11-30 ENCOUNTER — Telehealth: Payer: Self-pay | Admitting: Internal Medicine

## 2010-01-15 ENCOUNTER — Ambulatory Visit: Payer: Self-pay | Admitting: Internal Medicine

## 2010-01-15 LAB — CONVERTED CEMR LAB
ALT: 12 units/L (ref 0–35)
AST: 15 units/L (ref 0–37)
Albumin: 4 g/dL (ref 3.5–5.2)
Alkaline Phosphatase: 64 units/L (ref 39–117)
BUN: 11 mg/dL (ref 6–23)
Basophils Absolute: 0 10*3/uL (ref 0.0–0.1)
Basophils Relative: 0 % (ref 0–1)
CO2: 23 meq/L (ref 19–32)
Calcium: 9.8 mg/dL (ref 8.4–10.5)
Chloride: 104 meq/L (ref 96–112)
Creatinine, Ser: 0.78 mg/dL (ref 0.40–1.20)
Eosinophils Absolute: 0.1 10*3/uL (ref 0.0–0.7)
Eosinophils Relative: 1 % (ref 0–5)
Glucose, Bld: 104 mg/dL — ABNORMAL HIGH (ref 70–99)
HCT: 35.5 % — ABNORMAL LOW (ref 36.0–46.0)
HIV 1 RNA Quant: <48 copies/mL (ref <48)
HIV-1 RNA Quant, Log: <1.68 (ref <1.68)
Hemoglobin: 12.1 g/dL (ref 12.0–15.0)
Lymphocytes Relative: 48 % — ABNORMAL HIGH (ref 12–46)
Lymphs Abs: 2.3 10*3/uL (ref 0.7–4.0)
MCHC: 34.1 g/dL (ref 30.0–36.0)
MCV: 101.7 fL — ABNORMAL HIGH (ref 78.0–100.0)
Monocytes Absolute: 0.3 10*3/uL (ref 0.1–1.0)
Monocytes Relative: 6 % (ref 3–12)
Neutro Abs: 2.2 10*3/uL (ref 1.7–7.7)
Neutrophils Relative %: 45 % (ref 43–77)
Platelets: 356 10*3/uL (ref 150–400)
Potassium: 4 meq/L (ref 3.5–5.3)
RBC: 3.49 M/uL — ABNORMAL LOW (ref 3.87–5.11)
RDW: 15.5 % (ref 11.5–15.5)
Sodium: 138 meq/L (ref 135–145)
Total Bilirubin: 0.5 mg/dL (ref 0.3–1.2)
Total Protein: 7.4 g/dL (ref 6.0–8.3)
WBC: 4.8 10*3/uL (ref 4.0–10.5)

## 2010-02-01 ENCOUNTER — Ambulatory Visit: Payer: Self-pay | Admitting: Internal Medicine

## 2010-02-01 LAB — CONVERTED CEMR LAB: Pap Smear: NEGATIVE

## 2010-02-05 ENCOUNTER — Encounter: Payer: Self-pay | Admitting: Internal Medicine

## 2010-08-11 LAB — CONVERTED CEMR LAB
ALT: 22 units/L (ref 0–35)
ALT: 32 units/L (ref 0–35)
AST: 18 units/L (ref 0–37)
AST: 23 units/L (ref 0–37)
Albumin: 3.8 g/dL (ref 3.5–5.2)
Albumin: 4 g/dL (ref 3.5–5.2)
Alkaline Phosphatase: 109 units/L (ref 39–117)
Alkaline Phosphatase: 75 units/L (ref 39–117)
BUN: 14 mg/dL (ref 6–23)
BUN: 9 mg/dL (ref 6–23)
Basophils Absolute: 0 10*3/uL (ref 0.0–0.1)
Basophils Absolute: 0 10*3/uL (ref 0.0–0.1)
Basophils Relative: 1 % (ref 0–1)
Basophils Relative: 1 % (ref 0–1)
CD4 Count: 330 microliters
CD4 Count: 340 microliters
CO2: 26 meq/L (ref 19–32)
CO2: 27 meq/L (ref 19–32)
Calcium: 9 mg/dL (ref 8.4–10.5)
Calcium: 9.2 mg/dL (ref 8.4–10.5)
Chloride: 104 meq/L (ref 96–112)
Chloride: 106 meq/L (ref 96–112)
Creatinine, Ser: 0.7 mg/dL (ref 0.40–1.20)
Creatinine, Ser: 0.79 mg/dL (ref 0.40–1.20)
Eosinophils Absolute: 0.1 10*3/uL (ref 0.0–0.7)
Eosinophils Absolute: 0.1 10*3/uL (ref 0.0–0.7)
Eosinophils Relative: 2 % (ref 0–5)
Eosinophils Relative: 3 % (ref 0–5)
Glucose, Bld: 103 mg/dL — ABNORMAL HIGH (ref 70–99)
Glucose, Bld: 86 mg/dL (ref 70–99)
HCT: 35.4 % — ABNORMAL LOW (ref 36.0–46.0)
HCT: 37 % (ref 36.0–46.0)
HIV 1 RNA Quant: 219 copies/mL — ABNORMAL HIGH (ref <50)
HIV 1 RNA Quant: <50 copies/mL (ref <50)
HIV-1 RNA Quant, Log: 2.34 — ABNORMAL HIGH (ref <1.70)
HIV-1 RNA Quant, Log: <1.7 (ref <1.70)
Hemoglobin: 11.8 g/dL — ABNORMAL LOW (ref 12.0–15.0)
Hemoglobin: 12.2 g/dL (ref 12.0–15.0)
Lymphocytes Relative: 54 % — ABNORMAL HIGH (ref 12–46)
Lymphocytes Relative: 61 % — ABNORMAL HIGH (ref 12–46)
Lymphs Abs: 1.8 10*3/uL (ref 0.7–3.3)
Lymphs Abs: 1.9 10*3/uL (ref 0.7–3.3)
MCHC: 33 g/dL (ref 30.0–36.0)
MCHC: 33.3 g/dL (ref 30.0–36.0)
MCV: 87.8 fL (ref 78.0–100.0)
MCV: 88.9 fL (ref 78.0–100.0)
Monocytes Absolute: 0.2 10*3/uL (ref 0.2–0.7)
Monocytes Absolute: 0.3 10*3/uL (ref 0.2–0.7)
Monocytes Relative: 8 % (ref 3–11)
Monocytes Relative: 8 % (ref 3–11)
Neutro Abs: 0.8 10*3/uL — ABNORMAL LOW (ref 1.7–7.7)
Neutro Abs: 1.2 10*3/uL — ABNORMAL LOW (ref 1.7–7.7)
Neutrophils Relative %: 27 % — ABNORMAL LOW (ref 43–77)
Neutrophils Relative %: 35 % — ABNORMAL LOW (ref 43–77)
Pap Smear: NORMAL
Platelets: 277 10*3/uL (ref 150–400)
Platelets: 329 10*3/uL (ref 150–400)
Potassium: 3.9 meq/L (ref 3.5–5.3)
Potassium: 4.3 meq/L (ref 3.5–5.3)
RBC: 4.03 M/uL (ref 3.87–5.11)
RBC: 4.16 M/uL (ref 3.87–5.11)
RDW: 13.4 % (ref 11.5–14.0)
RDW: 13.6 % (ref 11.5–14.0)
Sodium: 137 meq/L (ref 135–145)
Sodium: 139 meq/L (ref 135–145)
Total Bilirubin: 0.2 mg/dL — ABNORMAL LOW (ref 0.3–1.2)
Total Bilirubin: 0.2 mg/dL — ABNORMAL LOW (ref 0.3–1.2)
Total Protein: 7.4 g/dL (ref 6.0–8.3)
Total Protein: 7.6 g/dL (ref 6.0–8.3)
WBC: 2.9 10*3/uL — ABNORMAL LOW (ref 4.0–10.5)
WBC: 3.5 10*3/uL — ABNORMAL LOW (ref 4.0–10.5)

## 2010-08-13 ENCOUNTER — Ambulatory Visit
Admission: RE | Admit: 2010-08-13 | Discharge: 2010-08-13 | Payer: Self-pay | Source: Home / Self Care | Attending: Internal Medicine | Admitting: Internal Medicine

## 2010-08-13 ENCOUNTER — Encounter: Payer: Self-pay | Admitting: Internal Medicine

## 2010-08-13 LAB — CONVERTED CEMR LAB
ALT: 13 units/L (ref 0–35)
AST: 12 units/L (ref 0–37)
Albumin: 4 g/dL (ref 3.5–5.2)
Alkaline Phosphatase: 68 units/L (ref 39–117)
BUN: 15 mg/dL (ref 6–23)
Basophils Absolute: 0 10*3/uL (ref 0.0–0.1)
Basophils Relative: 0 % (ref 0–1)
CO2: 29 meq/L (ref 19–32)
Calcium: 9.5 mg/dL (ref 8.4–10.5)
Chloride: 103 meq/L (ref 96–112)
Creatinine, Ser: 0.87 mg/dL (ref 0.40–1.20)
Eosinophils Absolute: 0.1 10*3/uL (ref 0.0–0.7)
Eosinophils Relative: 2 % (ref 0–5)
Glucose, Bld: 78 mg/dL (ref 70–99)
HCT: 34 % — ABNORMAL LOW (ref 36.0–46.0)
HIV 1 RNA Quant: <20 copies/mL (ref <20)
HIV-1 RNA Quant, Log: <1.3 (ref <1.30)
Hemoglobin: 11.5 g/dL — ABNORMAL LOW (ref 12.0–15.0)
Lymphocytes Relative: 51 % — ABNORMAL HIGH (ref 12–46)
Lymphs Abs: 2.6 10*3/uL (ref 0.7–4.0)
MCHC: 33.8 g/dL (ref 30.0–36.0)
MCV: 98.3 fL (ref 78.0–100.0)
Monocytes Absolute: 0.4 10*3/uL (ref 0.1–1.0)
Monocytes Relative: 8 % (ref 3–12)
Neutro Abs: 2 10*3/uL (ref 1.7–7.7)
Neutrophils Relative %: 39 % — ABNORMAL LOW (ref 43–77)
Platelets: 347 10*3/uL (ref 150–400)
Potassium: 4.1 meq/L (ref 3.5–5.3)
RBC: 3.46 M/uL — ABNORMAL LOW (ref 3.87–5.11)
RDW: 15.9 % — ABNORMAL HIGH (ref 11.5–15.5)
Sodium: 137 meq/L (ref 135–145)
Total Bilirubin: 0.5 mg/dL (ref 0.3–1.2)
Total Protein: 7.4 g/dL (ref 6.0–8.3)
WBC: 5.2 10*3/uL (ref 4.0–10.5)

## 2010-08-13 NOTE — Progress Notes (Signed)
Summary: Zovirax,Kaletra, & Combivir arrived via Mail order RX 08-24-07/md  Phone Note Refill Request        Prescriptions: COMBIVIR 150-300 MG  TABS (LAMIVUDINE-ZIDOVUDINE) Take 1 tablet by mouth two times a day  #60 x 0   Entered by:   Paulo Fruit   Authorized by:   Yisroel Ramming MD   Signed by:   Paulo Fruit on 08/24/2007   Method used:   Samples Given   RxID:   [id number] KALETRA 200-50 MG  TABS (LOPINAVIR-RITONAVIR) Take 2 tablets by mouth two times a day  #120 x 0   Entered by:   Paulo Fruit   Authorized by:   Yisroel Ramming MD   Signed by:   Paulo Fruit on 08/24/2007   Method used:   Samples Given   RxID:   [id number] ACYCLOVIR 400 MG  TABS (ACYCLOVIR) Take 1 tablet by mouth two times a day  #60 x 0   Entered by:   Paulo Fruit   Authorized by:   Yisroel Ramming MD   Signed by:   Paulo Fruit on 08/24/2007   Method used:   Samples Given   RxID:   [id number]  Patient Assist Medication Verification: Medication name: Acyclovir 400mg  RX # [id number] Tech approval:MLD   Patient Assist Medication Verification: Medication: Combivir Lot# [id number] Exp Date:Sep 2012 Tech approval:MLD                Patient Assist Medication Verification: Medication: Kaletra 200/50mg  [id number] Exp Date:21 Dec 2009 Tech approval:MLD Call placed to patient with message that assistance medications are ready for pick-up. ...................................................................Paulo Fruit  August 24, 2007 7:30 PM

## 2010-08-13 NOTE — Miscellaneous (Signed)
Summary: Needs PAP scheduled @ next visit  Clinical Lists Changes  Observations: Added new observation of HEPBVAX#3: 07/14/2007 (08/31/2007 14:09) Added new observation of FLU VACCINE: 07/14/2007 (08/31/2007 14:09)

## 2010-08-13 NOTE — Letter (Signed)
Summary: Results Follow-up Letter  [facility]  [address]   [place], [place] [address]   Phone: [phone]  Fax: [phone]          February 05, 2010  [address] [place], [place]  [address]  Dear Ms. Lana,   The following are the results of your recent test(s):  Test     Result     Pap Smear    Normal_XXX__  Not Normal_____       Comments:  Everything was normal.  I will see you in one year for your next PAP smear.  Thank you for coming to the Center for your care.  Sincerely,    Jennet Maduro [facility]

## 2010-08-13 NOTE — Miscellaneous (Signed)
Summary: CD4  Clinical Lists Changes  Observations: Added new observation of CD4 COUNT: 330 microliters (10/12/2006 9:22)

## 2010-08-13 NOTE — Miscellaneous (Signed)
Summary: Rw Update  Clinical Lists Changes  Observations: Added new observation of HIV STATUS: CDC-defined AIDS (08/15/2009 16:44)

## 2010-08-13 NOTE — Progress Notes (Signed)
Summary: 1rx of Acyclovir called in 07-14-07/mld  Phone Note Refill Request        Prescriptions: ACYCLOVIR 400 MG  TABS (ACYCLOVIR) Take 1 tablet by mouth two times a day  #60 x 0   Entered by:   Paulo Fruit   Authorized by:   Yisroel Ramming MD   Signed by:   Paulo Fruit on 07/14/2007   Method used:   Electronically sent to ...       [address]       [address]       [place], [place]  [address]       Ph: [phone]       Fax: [phone]   RxID:   [id number]  Patient receives medication via mail order however having an outbreak.  called 1rx of Acyclovir to local pharmacy until mail order arrives. ...................................................................Paulo Fruit  July 14, 2007 9:51 AM

## 2010-08-13 NOTE — Assessment & Plan Note (Signed)
Summary: CHECKUP/SB.   Primary Provider:  Yisroel Ramming MD  CC:  check up.  History of Present Illness: Pt here for f/u. She missed about a week of her meds prior to labs.  She is back on her meds at this time. No new problems.  Preventive Screening-Counseling & Management  Alcohol-Tobacco     Alcohol drinks/day: 0     Smoking Status: never     Passive Smoke Exposure: no  Caffeine-Diet-Exercise     Caffeine use/day: tea     Does Patient Exercise: yes     Type of exercise: walking     Exercise (avg: min/session): >60     Times/week: 5  Safety-Violence-Falls     Seat Belt Use: yes   Updated Prior Medication List: KALETRA 200-50 MG  TABS (LOPINAVIR-RITONAVIR) Take 2 tablets by mouth two times a day COMBIVIR 150-300 MG  TABS (LAMIVUDINE-ZIDOVUDINE) Take 1 tablet by mouth two times a day IMITREX 50 MG TABS (SUMATRIPTAN SUCCINATE) one by mouth at first sign of migraine.  May repeat at 2 hours if headache persists ATENOLOL 25 MG TABS (ATENOLOL) one by mouth once daily for blood pressure  Current Allergies (reviewed today): No known allergies  Past History:  Past Medical History: Last updated: 09/03/2006 HIV disease  Review of Systems  The patient denies anorexia, fever, and weight loss.    Vital Signs:  Patient profile:   30 year old female Menstrual status:  regular Height:      63 inches (160.02 cm) Weight:      226.9 pounds (103.14 kg) BMI:     40.34 Temp:     98.2 degrees F (36.78 degrees C) oral Pulse rate:   76 / minute BP sitting:   117 / 75  (right arm)  Vitals Entered By: Baxter Hire) (September 24, 2009 3:26 PM) CC: check up Pain Assessment Patient in pain? no      Nutritional Status BMI of > 30 = obese Nutritional Status Detail appetite is pretty good per patient  Have you ever been in a relationship where you felt threatened, hurt or afraid?No   Does patient need assistance? Functional Status Self care Ambulation Normal   Physical  Exam  General:  alert, well-developed, well-nourished, and well-hydrated.   Head:  normocephalic and atraumatic.   Lungs:  normal breath sounds.      Impression & Recommendations:  Problem # 1:  HIV DISEASE (ICD-042) VL elevated to 24,200. May be due to the fact that she missed about a week of her meds. I will have her return in 6 weeks for repeat labs. Pt to take her meds every day. Diagnostics Reviewed:  HIV: CDC-defined AIDS (08/15/2009)   HIV-Western blot: POSITIVE (08/27/2006)   CD4: 550 (09/14/2009)   WBC: 4.6 (09/13/2009)   Hgb: 12.7 (09/13/2009)   HCT: 38.7 (09/13/2009)   Platelets: 263 (09/13/2009) HIV-1 RNA: 24200 (09/13/2009)   HBSAg: Negative (09/11/2006)  Problem # 2:  PREVENTIVE HEALTH CARE (ICD-V70.0) Influenza vaccine given.  Other Orders: Est. Patient Level III ([address]) Influenza Vaccine NON MCR ([address]) Future Orders: T-CD4SP ([facility]) (CD4SP) ... 11/05/2009 T-HIV Viral Load [id number]) ... 11/05/2009 T-Comprehensive Metabolic Panel [phone]) ... 11/05/2009 T-CBC w/Diff ([id number]) ... 11/05/2009  Patient Instructions: 1)  Please schedule a follow-up appointment in 8 weeks.  Process Orders Check Orders Results:     [facility]: ABN not required for this insurance Tests Sent for requisitioning (September 24, 2009 4:02 PM):     11/05/2009: [facility] --  T-HIV Viral Load [phone] (signed)     11/05/2009: [facility] -- T-Comprehensive Metabolic Panel [[id number]] (signed)     11/05/2009: [facility] -- T-CBC w/Diff [[id number]] (signed)    Influenza Vaccine    Vaccine Type: Fluvax Non-MCR    Site: right deltoid    Mfr: Novartis    Dose: 0.5 ml    Route: IM    Given by: Kathi Simpers CMA(AAMA)    Exp. Date: 10/12/2009    Lot #: [id number]    VIS given: 02/04/07 version given September 24, 2009.  Flu Vaccine Consent Questions    Do you have a history of severe allergic reactions to this  vaccine? no    Any prior history of allergic reactions to egg and/or gelatin? no    Do you have a sensitivity to the preservative Thimersol? no    Do you have a past history of Guillan-Barre Syndrome? no    Do you currently have an acute febrile illness? no    Have you ever had a severe reaction to latex? no    Vaccine information given and explained to patient? yes    Are you currently pregnant? no

## 2010-08-13 NOTE — Progress Notes (Signed)
Summary: Pt. assist meds arrived via [facility] for Mar  Phone Note Refill Request      Prescriptions: COMBIVIR 150-300 MG  TABS (LAMIVUDINE-ZIDOVUDINE) Take 1 tablet by mouth two times a day  #60 x 0   Entered by:   Paulo Fruit  BS,CPht II,MPH   Authorized by:   Yisroel Ramming MD   Signed by:   Paulo Fruit  BS,CPht II,MPH on 09/13/2009   Method used:   Samples Given   RxID:   [id number] KALETRA 200-50 MG  TABS (LOPINAVIR-RITONAVIR) Take 2 tablets by mouth two times a day  #120 x 0   Entered by:   Paulo Fruit  BS,CPht II,MPH   Authorized by:   Yisroel Ramming MD   Signed by:   Paulo Fruit  BS,CPht II,MPH on 09/13/2009   Method used:   Samples Given   RxID:   [id number]   Patient Assist Medication Verification: Medication:Combivir 150/300mg  QIO#[id number] Exp Date:Sep 2014 Tech approval:MLD                Patient Assist Medication Verification: Medication:Kaletra 200/50mg  UXL#[id number] Exp Date:10 May 2012 Tech approval:MLD Call placed to patient with message that assistance medications are ready for pick-up. Will pick up during lab appt. Paulo Fruit  BS,CPht II,MPH  September 13, 2009 2:51 PM

## 2010-08-13 NOTE — Progress Notes (Signed)
Summary: [facility] visit 11/18/07 for pharyngitis, amoxicillin x 10 days  Phone Note Call from Patient Call back at [facility] Phone [phone]   Caller: Patient Reason for Call: Acute Illness Action Taken: Phone Call Completed Summary of Call: Mesage left.  Pt. seen @ [facility] 11/18/07 for pharyngitis.  Given rx for 10 days of amoxicillin.  Pt. just wanted Dr. Philipp Deputy to know.  No call back nesessary. Initial call taken by: Jennet Maduro RN,  Nov 19, 2007 9:54 AM

## 2010-08-13 NOTE — Progress Notes (Signed)
Summary: Dec. meds arrived via [facility] mail order Rx  Phone Note Refill Request      Prescriptions: COMBIVIR 150-300 MG  TABS (LAMIVUDINE-ZIDOVUDINE) Take 1 tablet by mouth two times a day  #60 x 0   Entered by:   Paulo Fruit  BS,CPht II,MPH   Authorized by:   Yisroel Ramming MD   Signed by:   Paulo Fruit  BS,CPht II,MPH on 07/05/2009   Method used:   Samples Given   RxID:   [id number] KALETRA 200-50 MG  TABS (LOPINAVIR-RITONAVIR) Take 2 tablets by mouth two times a day  #120 x 0   Entered by:   Paulo Fruit  BS,CPht II,MPH   Authorized by:   Yisroel Ramming MD   Signed by:   Paulo Fruit  BS,CPht II,MPH on 07/05/2009   Method used:   Samples Given   RxID:   [id number]   Patient Assist Medication Verification: Medication: Kaletra 200/50mg  QIO#[id number] Exp Date:04 Mar 2012 Tech approval:MLD                Patient Assist Medication Verification: Medication: Combivir 150/300mg  UXL#[id number] Exp Date:Jul 2014 Tech approval:MLD Call placed to patient with message that assistance medications are ready for pick-up. Paulo Fruit  BS,CPht II,MPH  July 05, 2009 3:46 PM

## 2010-08-13 NOTE — Assessment & Plan Note (Signed)
Summary: est-ck/fu/meds/cfb   Primary Provider:  Yisroel Ramming MD  CC:  F/U LABS and ANKLES SWELLING.  History of Present Illness: Pt doing well.  She missed several doses of her meds a couple of months ago but has been taking them regularly lately.   Preventive Screening-Counseling & Management  Alcohol-Tobacco     Alcohol drinks/day: 0     Smoking Status: never     Passive Smoke Exposure: no  Caffeine-Diet-Exercise     Caffeine use/day: occassionally     Does Patient Exercise: yes     Type of exercise: walking     Exercise (avg: min/session): >60     Times/week: 5  Hep-HIV-STD-Contraception     HIV Risk: no  Safety-Violence-Falls     Seat Belt Use: yes      Drug Use:  no.     Updated Prior Medication List: KALETRA 200-50 MG  TABS (LOPINAVIR-RITONAVIR) Take 2 tablets by mouth two times a day COMBIVIR 150-300 MG  TABS (LAMIVUDINE-ZIDOVUDINE) Take 1 tablet by mouth two times a day IMITREX 50 MG TABS (SUMATRIPTAN SUCCINATE) one by mouth at first sign of migraine.  May repeat at 2 hours if headache persists ATENOLOL 25 MG TABS (ATENOLOL) one by mouth once daily for blood pressure  Current Allergies (reviewed today): No known allergies  Past History:  Past Medical History: Last updated: 09/03/2006 HIV disease  Additional History Menstrual Status:  regular  Review of Systems  The patient denies anorexia, fever, and weight loss.    Vital Signs:  Patient profile:   30 year old female Menstrual status:  regular LMP:     05/01/2009 Height:      63 inches (160.02 cm) Weight:      224 pounds (101.82 kg) BMI:     39.82 Temp:     98.1 degrees F (36.72 degrees C) oral Pulse rate:   67 / minute BP sitting:   123 / 80  (left arm)  Vitals Entered By: Starleen Arms CMA (May 23, 2009 3:20 PM) CC: F/U LABS, ANKLES SWELLING Is Patient Diabetic? No Pain Assessment Patient in pain? no      Nutritional Status BMI of > 30 = obese Nutritional Status Detail  NL  Does patient need assistance? Functional Status Self care Ambulation Normal Comments NO MISSED DOSES LMP (date): 05/01/2009 LMP - Character: normal     Menstrual Status regular Enter LMP: 05/01/2009 Last PAP Result NEGATIVE FOR INTRAEPITHELIAL LESIONS OR MALIGNANCY.   Physical Exam  General:  alert, well-developed, well-nourished, and well-hydrated.   Head:  normocephalic and atraumatic.   Mouth:  pharynx pink and moist.   Lungs:  normal breath sounds.      Impression & Recommendations:  Problem # 1:  HIV DISEASE (ICD-042) Pt.s most recent CD4ct was 380 and VL <48 .  Pt instructed to continue the current antiretroviral regimen.  Pt encouraged to take medication regularly and not miss doses.  Pt will f/u in 3 months for repeat blood work and will see me 2 weeks later.  Influenza vaccine given today.  Diagnostics Reviewed:  HIV: HIV positive - not AIDS (09/08/2008)   HIV-Western blot: POSITIVE (08/27/2006)   CD4: 380 (05/10/2009)   WBC: 5.1 (05/09/2009)   Hgb: 12.4 (05/09/2009)   HCT: 38.4 (05/09/2009)   Platelets: 342 (05/09/2009) HIV-1 RNA: <48 copies/mL (05/09/2009)   HBSAg: Negative (09/11/2006)  Problem # 2:  ELEVATED BLOOD PRESSURE (ICD-796.2) controlled Her updated medication list for this problem includes:    Atenolol 25  Mg Tabs (Atenolol) ..... One by mouth once daily for blood pressure  Other Orders: Est. Patient Level III ([address]) Future Orders: T-CD4SP ([facility]) (CD4SP) ... 08/21/2009 T-HIV Viral Load [phone]) ... 08/21/2009 T-Comprehensive Metabolic Panel [phone]) ... 08/21/2009 T-CBC w/Diff ([id number]) ... 08/21/2009 T-Lipid Profile [phone]) ... 08/21/2009  Patient Instructions: 1)  Please schedule a follow-up appointment in 3 months, 2 weeks after labs.  Prescriptions: ATENOLOL 25 MG TABS (ATENOLOL) one by mouth once daily for blood pressure  #30 x 5   Entered and Authorized by:   Yisroel Ramming MD   Signed by:   Yisroel Ramming MD on  05/23/2009   Method used:   Print then Give to Patient   RxID:   [id number]  Process Orders Check Orders Results:     [facility]: ABN not required for this insurance Tests Sent for requisitioning (May 24, 2009 9:46 AM):     08/21/2009: [facility] -- T-HIV Viral Load [phone] (signed)     08/21/2009: [facility] -- T-Comprehensive Metabolic Panel [[id number]] (signed)     08/21/2009: [facility] -- T-CBC w/Diff [[id number]] (signed)     08/21/2009: [facility] -- T-Lipid Profile [phone] (signed)
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
## 2010-08-13 NOTE — Progress Notes (Signed)
Summary: Pt. assist meds arrived via [facility] for [facility]  Phone Note Refill Request      Prescriptions: COMBIVIR 150-300 MG  TABS (LAMIVUDINE-ZIDOVUDINE) Take 1 tablet by mouth two times a day  #60 x 0   Entered by:   Paulo Fruit  BS,CPht II,MPH   Authorized by:   Yisroel Ramming MD   Signed by:   Paulo Fruit  BS,CPht II,MPH on 10/24/2009   Method used:   Samples Given   RxID:   [id number] KALETRA 200-50 MG  TABS (LOPINAVIR-RITONAVIR) Take 2 tablets by mouth two times a day  #120 x 0   Entered by:   Paulo Fruit  BS,CPht II,MPH   Authorized by:   Yisroel Ramming MD   Signed by:   Paulo Fruit  BS,CPht II,MPH on 10/24/2009   Method used:   Samples Given   RxID:   [id number]   Patient Assist Medication Verification: Medication: Kaletra 200/50mg  Lot# [id number] Exp Date:25 Ayg 2013 Tech approval:MLD                Patient Assist Medication Verification: Medication: combivir 150mg /300mg  [id number] Exp Date:Oct 2014 Tech approval:MLD Call placed to patient with message that assistance medications are ready for pick-up. Left message on VM for patient. Paulo Fruit  BS,CPht II,MPH  October 24, 2009 12:41 PM                   Appended Document: Pt. assist meds arrived via [facility] for [facility] Prescription/Samples picked up by: patient Husband picked up

## 2010-08-13 NOTE — Progress Notes (Signed)
Summary: Atripla & Bactrim arrived via [facility] 07-02-07/mld  Phone Note Refill Request        Prescriptions: BACTRIM DS 800-160 MG TABS (SULFAMETHOXAZOLE-TRIMETHOPRIM) Take 1 tablet by mouth once a day  #30 x 0   Entered by:   Paulo Fruit   Authorized by:   Yisroel Ramming MD   Signed by:   Paulo Fruit on 07/05/2007   Method used:   Samples Given   RxID:   [id number] ATRIPLA 600-200-300 MG TABS (EFAVIRENZ-EMTRICITAB-TENOFOVIR) Take 1 tablet by mouth at bedtime  #30 x 0   Entered by:   Paulo Fruit   Authorized by:   Yisroel Ramming MD   Signed by:   Paulo Fruit on 07/05/2007   Method used:   Samples Given   RxID:   [id number]  Patient Assist Medication Verification: Medication name: SMZ-TMP DS 800/160mg  RX # [id number] Tech approval:MLD   Patient Assist Medication Verification: Medication: Atripla Lot# [id number] Exp Date:05 2010 Tech approval:MLD Call placed to patient with message that assistance medications are ready for pick-up. ...................................................................Paulo Fruit  July 05, 2007 8:52 AM

## 2010-08-13 NOTE — Assessment & Plan Note (Signed)
Summary: 2WK FU/VS   Chief Complaint:  ofc visit.  History of Present Illness: Pt here to get lab results.  No new complaints.  She is still having intermittant swelling in her feet.  Current Allergies: No known allergies     Risk Factors: Tobacco use:  never Drug use:  no HIV high-risk behavior:  no Caffeine use:  1 drinks per day Alcohol use:  no Exercise:  yes    Times per week:  3    Type:  fitness club Seatbelt use:  100 %  PAP Smear History:    Date of Last PAP Smear:  09/11/2006   Review of Systems  The patient denies anorexia, fever, and weight loss.     Vital Signs:  Patient Profile:   30 Years Old Female Height:     62 inches (157.48 cm) Weight:      208.7 pounds (94.86 kg) BMI:     38.31 Temp:     98.0 degrees F (36.67 degrees C) oral Pulse rate:   83 / minute BP sitting:   121 / 80  (right arm)  Pt. in pain?   no  Vitals Entered By: Clarise Cruz Duncan Dull) (January 13, 2007 2:22 PM)              Is Patient Diabetic? No Nutritional Status BMI of 19 -24 = normal  Have you ever been in a relationship where you felt threatened, hurt or afraid?No   Does patient need assistance? Functional Status Self care Ambulation Normal Last PAP Date 07/30/2006   Physical Exam  General:     alert, well-developed, well-nourished, and well-hydrated.   Mouth:     pharynx pink and moist.  no thrush  Lungs:     normal breath sounds.      Impression & Recommendations:  Problem # 1:  HIV DISEASE (ICD-042) Last CD4ct was 340 and VL was <50.  Pt will continue current regimen and f/u in 3 months.  She will be due for her third Hep B vaccine next visit. Her updated medication list for this problem includes:    Atripla 600-200-300 Mg Tabs (Efavirenz-emtricitab-tenofovir) .[name]... Take 1 tablet by mouth at bedtime    Bactrim Ds 800-160 Mg Tabs (Sulfamethoxazole-trimethoprim) .[name]... Take 1 tablet by mouth once a day  Orders: Est. Patient Level III  ([address])  Future Orders: T-Comprehensive Metabolic Panel ([id number]) ... 04/14/2007 T-CBC w/Diff ([id number]) ... 04/14/2007 T-CD4 ([id number]) ... 04/14/2007 T-HIV Viral Load [phone]) ... 04/14/2007    Patient Instructions: 1)  Please schedule a follow-up appointment in 3 months, 2 weeks after labs.   ]

## 2010-08-13 NOTE — Progress Notes (Signed)
Summary: Atripla & Bactrim refill 06-21-07/mld  Phone Note Refill Request Message from:  Fax from Pharmacy  Refills Requested: Medication #1:  ATRIPLA 600-200-300 MG TABS Take 1 tablet by mouth at bedtime   Dosage confirmed as above?Dosage Confirmed   Last Refilled: 06/01/2007  Medication #2:  BACTRIM DS 800-160 MG TABS Take 1 tablet by mouth once a day.   Dosage confirmed as above?Dosage Confirmed   Last Refilled: 06/01/2007 Received fax refill request from [facility] mail order pharmacy [facility]) [phone]  Initial call taken by: Paulo Fruit,  June 21, 2007 7:34 PM      Prescriptions: BACTRIM DS 800-160 MG TABS (SULFAMETHOXAZOLE-TRIMETHOPRIM) Take 1 tablet by mouth once a day  #30 x 6   Entered by:   Paulo Fruit   Authorized by:   Yisroel Ramming MD   Signed by:   Paulo Fruit on 06/21/2007   Method used:   Telephoned to ...         RxID:   [id number] ATRIPLA 600-200-300 MG TABS (EFAVIRENZ-EMTRICITAB-TENOFOVIR) Take 1 tablet by mouth at bedtime  #30 x 6   Entered by:   Paulo Fruit   Authorized by:   Yisroel Ramming MD   Signed by:   Paulo Fruit on 06/21/2007   Method used:   Telephoned to ...         RxID:   [id number]  ...................................................................Paulo Fruit  June 21, 2007 7:35 PM

## 2010-08-13 NOTE — Assessment & Plan Note (Signed)
Summary: 0900 PAP appt. prior to Dr. Philipp Deputy /dde   Vital Signs:  Patient profile:   30 year old female Menstrual status:  regular LMP:     01/10/2010  Vitals Entered By: Jennet Maduro RN (February 01, 2010 9:21 AM) CC: PAP smear appt.  Pt. given condoms.  Pt. given educational materials re:  HIV and women, diet, exercise, BSE, self-esteem, Prevention for Positives and herpes. LMP (date): 01/10/2010 LMP - Character: normal     Menstrual Status regular Enter LMP: 01/10/2010 Last PAP Result NEGATIVE FOR INTRAEPITHELIAL LESIONS OR MALIGNANCY.    CC:  PAP smear appt.  Pt. given condoms.  Pt. given educational materials re:  HIV and women, diet, exercise, BSE, self-esteem, and Prevention for Positives and herpes..   Patient Instructions: 1)  Your results will be ready in about a week.  I will send you a letter in the mail after they are reviewed. 2)  Thank you for coming to the Center today. 3)  Please schedule a follow-up appointment in 1 year.   Evaluation and Follow-Up  Prevention For Positives: 02/01/2010   Safe sex practices discussed with patient. Condoms offered. Prior Medications: KALETRA 200-50 MG  TABS (LOPINAVIR-RITONAVIR) Take 2 tablets by mouth two times a day COMBIVIR 150-300 MG  TABS (LAMIVUDINE-ZIDOVUDINE) Take 1 tablet by mouth two times a day IMITREX 50 MG TABS (SUMATRIPTAN SUCCINATE) one by mouth at first sign of migraine.  May repeat at 2 hours if headache persists ATENOLOL 25 MG TABS (ATENOLOL) one by mouth once daily for blood pressure Current Allergies: No known allergies  Orders Added: 1)  Est. Patient Level I [99211] 2)  T-PAP [facility]) [81191]          Medication Adherence: 02/01/2010   Adherence to medications reviewed with patient. Counseling to provide adequate adherence provided    Prevention For Positives: 02/01/2010   Safe sex practices discussed with patient. Condoms offered.

## 2010-08-13 NOTE — Assessment & Plan Note (Signed)
Summary: f/u + PAP smear /dde   Primary Provider:  Yisroel Ramming MD  CC:  follow-up visit and lab results.  History of Present Illness: Pt doing well.  No missed doses of her meds.  Preventive Screening-Counseling & Management  Alcohol-Tobacco     Alcohol drinks/day: 0     Smoking Status: never     Passive Smoke Exposure: no  Caffeine-Diet-Exercise     Caffeine use/day: tea     Does Patient Exercise: yes     Type of exercise: walking     Exercise (avg: min/session): >60     Times/week: 5  Hep-HIV-STD-Contraception     HIV Risk: no  Safety-Violence-Falls     Seat Belt Use: yes      Drug Use:  no.    Comments: pt. given condoms   Updated Prior Medication List: KALETRA 200-50 MG  TABS (LOPINAVIR-RITONAVIR) Take 2 tablets by mouth two times a day COMBIVIR 150-300 MG  TABS (LAMIVUDINE-ZIDOVUDINE) Take 1 tablet by mouth two times a day ATENOLOL 25 MG TABS (ATENOLOL) one by mouth once daily for blood pressure  Current Allergies (reviewed today): No known allergies  Past History:  Past Medical History: Last updated: 09/03/2006 HIV disease  Review of Systems  The patient denies anorexia, fever, and weight loss.    Vital Signs:  Patient profile:   30 year old female Menstrual status:  regular Height:      63 inches (160.02 cm) Weight:      222.8 pounds (101.27 kg) BMI:     39.61 Temp:     98.6 degrees F (37.00 degrees C) oral Pulse rate:   90 / minute BP sitting:   127 / 89  (right arm)  Vitals Entered By: Wendall Mola CMA Duncan Dull) (February 01, 2010 8:59 AM) CC: follow-up visit, lab results Is Patient Diabetic? No Pain Assessment Patient in pain? no      Nutritional Status BMI of > 30 = obese Nutritional Status Detail appetite "up and down"  Have you ever been in a relationship where you felt threatened, hurt or afraid?No   Does patient need assistance? Functional Status Self care Ambulation Normal Comments no missed doses of meds per  patient   Physical Exam  General:  alert, well-developed, well-nourished, and well-hydrated.   Head:  normocephalic and atraumatic.   Lungs:  normal breath sounds.      Impression & Recommendations:  Problem # 1:  HIV DISEASE (ICD-042) Pt.s most recent CD4ct was 490 and VL <48 .  Pt instructed to continue the current antiretroviral regimen.  Pt encouraged to take medication regularly and not miss doses.  Pt will f/u in 3 months for repeat blood work and will see me 2 weeks later.  PAP today.  Diagnostics Reviewed:  HIV: CDC-defined AIDS (08/15/2009)   HIV-Western blot: POSITIVE (08/27/2006)   CD4: 490 (01/16/2010)   WBC: 4.8 (01/15/2010)   Hgb: 12.1 (01/15/2010)   HCT: 35.5 (01/15/2010)   Platelets: 356 (01/15/2010) HIV-1 RNA: <48 copies/mL (01/15/2010)   HBSAg: Negative (09/11/2006)  Other Orders: Est. Patient Level III (16109) Future Orders: T-CD4SP ([facility]) (CD4SP) ... 05/02/2010 T-HIV Viral Load [id number]) ... 05/02/2010 T-Comprehensive Metabolic Panel [phone]) ... 05/02/2010 T-CBC w/Diff ([id number]) ... 05/02/2010  Patient Instructions: 1)  Please schedule a follow-up appointment in 3 months, 2 weeks after labs.

## 2010-08-13 NOTE — Progress Notes (Signed)
Summary: Pt. insurance plan changed mail order pharmacies.  Phone Note Call from Patient   Caller: Patient Details for Reason: medication delivery Summary of Call: Patient called to see if her medication has been delivered.  I returned patient's phone call to let her know that I have not seen anything come in her name as of yet.  I will call to see what is going on and recall the patient. Initial call taken by: Paulo Fruit  BS,CPht II,MPH,  Nov 30, 2009 2:33 PM  Follow-up for Phone Call        contacted [facility] and they have tried to contact patient on May 16 to let her know that her insurance is no longer contracted with their pharmacy.  She should now use [facility] mail order to obtain her medciations. They gave me the number to call: [phone].  Called the patient back to let her know what pharmacy said.  She said she just got her phone turned back on and it had multiple messages that she didn't listen to all of them.  She was not able to write the number down at the time I called her back. She is going to call me back to get the number.  I told her I will call in her prescriptions and she can call to set up with [facility] to have them shipped. Follow-up by: Paulo Fruit  BS,CPht II,MPH,  Nov 30, 2009 2:35 PM    Prescriptions: COMBIVIR 150-300 MG  TABS (LAMIVUDINE-ZIDOVUDINE) Take 1 tablet by mouth two times a day  #60 x 5   Entered by:   Paulo Fruit  BS,CPht II,MPH   Authorized by:   Yisroel Ramming MD   Signed by:   Paulo Fruit  BS,CPht II,MPH on 11/30/2009   Method used:   Electronically to        [facility]* (mail-order)             ,          Ph: [phone]       Fax: [phone]   RxID:   [id number] KALETRA 200-50 MG  TABS (LOPINAVIR-RITONAVIR) Take 2 tablets by mouth two times a day  #120 x 5   Entered by:   Paulo Fruit  BS,CPht II,MPH   Authorized by:   Yisroel Ramming MD   Signed by:   Paulo Fruit  BS,CPht II,MPH on 11/30/2009   Method used:    Electronically to        [facility]* (mail-order)             ,          Ph: [phone]       Fax: [phone]   RxID:   [id number]  Paulo Fruit  BS,CPht II,MPH  Nov 30, 2009 2:49 PM

## 2010-08-13 NOTE — Assessment & Plan Note (Signed)
Summary: ACUTE-POSSIBLE ABCESS/CFB   Primary Provider:  Yisroel Ramming MD  CC:  acute-possible abcess.  History of Present Illness: Pt had recurrent perineal abcesses in the past and 3 days ago felt a bump in her vaginal area and then felt some stinging when she wnt to the bathroom.  She would like to get checked.  No discharge.  Preventive Screening-Counseling & Management     Alcohol drinks/day: 0     Smoking Status: never     Caffeine use/day: occassionally     Does Patient Exercise: yes     Type of exercise: walking     Exercise (avg: min/session): >60     Times/week: 5     Seat Belt Use: yes   Updated Prior Medication List: KALETRA 200-50 MG  TABS (LOPINAVIR-RITONAVIR) Take 2 tablets by mouth two times a day COMBIVIR 150-300 MG  TABS (LAMIVUDINE-ZIDOVUDINE) Take 1 tablet by mouth two times a day IMITREX 50 MG TABS (SUMATRIPTAN SUCCINATE) one by mouth at first sign of migraine.  May repeat at 2 hours if headache persists ATENOLOL 25 MG TABS (ATENOLOL) one by mouth once daily for blood pressure  Current Allergies (reviewed today): No known allergies  Review of Systems  The patient denies anorexia, fever, weight loss, and abdominal pain.    Vital Signs:  Patient profile:   30 year old female Height:      63 inches (160.02 cm) Weight:      226.0 pounds (102.73 kg) BMI:     40.18 Temp:     97.9 degrees F (36.61 degrees C) oral Pulse rate:   77 / minute BP sitting:   129 / 90  (left arm)  Vitals Entered By: Baxter Hire) (Nov 15, 2008 3:31 PM) CC: acute-possible abcess Is Patient Diabetic? No Pain Assessment Patient in pain? no      Nutritional Status BMI of > 30 = obese Nutritional Status Detail slowing down  Have you ever been in a relationship where you felt threatened, hurt or afraid?No   Does patient need assistance? Functional Status Self care Ambulation Normal   Physical Exam  General:  alert, well-developed, well-nourished, and well-hydrated.    Head:  normocephalic and atraumatic.   Mouth:  pharynx pink and moist.   Genitalia:  normal introitus, no external lesions, and no vaginal discharge.     Impression & Recommendations:  Problem # 1:  BOILS, RECURRENT (ICD-680.9) I did not see anything externally - will refer for a PAP and pelvic exam Orders: Est. Patient Level III (45409)  Other Orders: T-Urine Pregnancy (in -house) ([id number])  Patient Instructions: 1)  schedule in PAP clinic  Appended Document: Results of Urine Pregnancy    Lab Visit   Laboratory Results   Urine Tests  Date/Time Received: Nov 15, 2008 4:16 PM   Date/Time Reported: Oren Beckmann  Nov 15, 2008 4:16 PM     Urine HCG: negative Comments: Urine Specific Gravity 1.026 Oren Beckmann  Nov 15, 2008 4:17 PM     Orders Today:

## 2010-08-14 LAB — T-HELPER CELL (CD4) - (RCID CLINIC ONLY)
CD4 % Helper T Cell: 24 % — ABNORMAL LOW (ref 33–55)
CD4 T Cell Abs: 610 uL (ref 400–2700)

## 2010-08-28 ENCOUNTER — Ambulatory Visit: Payer: BC Managed Care – PPO | Admitting: Internal Medicine

## 2010-09-24 ENCOUNTER — Encounter: Payer: Self-pay | Admitting: Infectious Disease

## 2010-09-26 ENCOUNTER — Encounter: Payer: Self-pay | Admitting: *Deleted

## 2010-09-29 LAB — T-HELPER CELL (CD4) - (RCID CLINIC ONLY)
CD4 % Helper T Cell: 23 % — ABNORMAL LOW (ref 33–55)
CD4 T Cell Abs: 490 uL (ref 400–2700)

## 2010-10-01 ENCOUNTER — Ambulatory Visit (INDEPENDENT_AMBULATORY_CARE_PROVIDER_SITE_OTHER): Payer: BC Managed Care – PPO | Admitting: Internal Medicine

## 2010-10-01 ENCOUNTER — Encounter: Payer: Self-pay | Admitting: Internal Medicine

## 2010-10-01 ENCOUNTER — Other Ambulatory Visit: Payer: Self-pay | Admitting: *Deleted

## 2010-10-01 VITALS — BP 117/84 | HR 86 | Temp 98.4°F | Ht 62.0 in | Wt 223.0 lb

## 2010-10-01 DIAGNOSIS — R030 Elevated blood-pressure reading, without diagnosis of hypertension: Secondary | ICD-10-CM

## 2010-10-01 DIAGNOSIS — R51 Headache: Secondary | ICD-10-CM

## 2010-10-01 DIAGNOSIS — B20 Human immunodeficiency virus [HIV] disease: Secondary | ICD-10-CM

## 2010-10-01 DIAGNOSIS — Z23 Encounter for immunization: Secondary | ICD-10-CM

## 2010-10-01 DIAGNOSIS — I10 Essential (primary) hypertension: Secondary | ICD-10-CM

## 2010-10-01 MED ORDER — ATENOLOL 25 MG PO TABS: 25.0000 mg | ORAL_TABLET | Freq: Every day | ORAL | Status: DC

## 2010-10-01 MED ORDER — PNEUMOCOCCAL VAC POLYVALENT 25 MCG/0.5ML IJ INJ
0.5000 mL | INJECTION | Freq: Once | INTRAMUSCULAR | Status: AC
  Administered 2010-10-01: 0.5 mL via INTRAMUSCULAR

## 2010-10-01 MED ORDER — INFLUENZA VAC TYP A&B SURF ANT IM INJ
0.5000 mL | INJECTION | Freq: Once | INTRAMUSCULAR | Status: AC
  Administered 2010-10-01: 0.5 mL via INTRAMUSCULAR

## 2010-10-01 MED ORDER — LAMIVUDINE-ZIDOVUDINE 150-300 MG PO TABS: 1.0000 | ORAL_TABLET | Freq: Two times a day (BID) | ORAL | Status: DC

## 2010-10-01 MED ORDER — SUMATRIPTAN SUCCINATE 50 MG PO TABS: 50.0000 mg | ORAL_TABLET | ORAL | Status: DC | PRN

## 2010-10-01 MED ORDER — LOPINAVIR-RITONAVIR 200-50 MG PO TABS: 2.0000 | ORAL_TABLET | Freq: Two times a day (BID) | ORAL | Status: DC

## 2010-10-01 NOTE — Assessment & Plan Note (Signed)
Her infection is under excellent control. I encouraged her to anticipate changes in her insurance and not miss doses.She should keep visits at no more than 6 month intervals and Ivin Booty should be tested every 6-12 months.

## 2010-10-01 NOTE — Assessment & Plan Note (Signed)
Her BP is well controlled 

## 2010-10-01 NOTE — Patient Instructions (Signed)
Try not to miss a single dose of your medications.

## 2010-10-01 NOTE — Miscellaneous (Signed)
Summary: problem list update  Clinical Lists Changes  Problems: Added new problem of LONG-TERM (CURRENT) USE OF OTHER MEDICATIONS (ICD-V58.69) 

## 2010-10-01 NOTE — Progress Notes (Signed)
  Subjective:    Patient ID: MEDEA DEINES, female    DOB: June 06, 1981, 30 y.o.   MRN: [id number]  HPI Yulia is in with her husband Ivin Booty for her routine visit.She was out of her HIV meds for 2-3 weeks in early January when she changed insurance but has not missed other doses. This is her first visit in one year. She has been stressed with worh as a Designer, jewellery. Her migraines have been more frequent. She has been out of Imitrex. They are sexually active but Ivin Booty always uses condoms. He dose not know his status.   Review of Systems  Neurological: Positive for headaches.       Objective:   Physical Exam  Constitutional: She appears well-developed.  Cardiovascular: Normal rate, regular rhythm and normal heart sounds.   No murmur heard. Pulmonary/Chest: Effort normal and breath sounds normal.  Psychiatric: She has a normal mood and affect.          Assessment & Plan:

## 2010-10-01 NOTE — Assessment & Plan Note (Signed)
I will refill imitrex.

## 2010-10-17 LAB — T-HELPER CELL (CD4) - (RCID CLINIC ONLY)
CD4 % Helper T Cell: 31 % — ABNORMAL LOW (ref 33–55)
CD4 T Cell Abs: 380 uL — ABNORMAL LOW (ref 400–2700)

## 2010-10-29 LAB — T-HELPER CELL (CD4) - (RCID CLINIC ONLY)
CD4 % Helper T Cell: 28 % — ABNORMAL LOW (ref 33–55)
CD4 T Cell Abs: 740 uL (ref 400–2700)

## 2010-11-19 ENCOUNTER — Other Ambulatory Visit: Payer: Self-pay | Admitting: Licensed Clinical Social Worker

## 2010-11-22 ENCOUNTER — Other Ambulatory Visit: Payer: Self-pay | Admitting: *Deleted

## 2010-11-25 ENCOUNTER — Other Ambulatory Visit: Payer: Self-pay | Admitting: *Deleted

## 2010-11-26 ENCOUNTER — Other Ambulatory Visit: Payer: Self-pay | Admitting: *Deleted

## 2011-04-04 LAB — T-HELPER CELL (CD4) - (RCID CLINIC ONLY)
CD4 % Helper T Cell: 25 — ABNORMAL LOW
CD4 T Cell Abs: 490

## 2011-04-21 LAB — T-HELPER CELL (CD4) - (RCID CLINIC ONLY): CD4 % Helper T Cell: 24 — ABNORMAL LOW

## 2011-05-01 LAB — T-HELPER CELL (CD4) - (RCID CLINIC ONLY): CD4 T Cell Abs: 340 — ABNORMAL LOW

## 2011-05-12 ENCOUNTER — Other Ambulatory Visit (INDEPENDENT_AMBULATORY_CARE_PROVIDER_SITE_OTHER): Payer: BC Managed Care – PPO

## 2011-05-12 DIAGNOSIS — Z79899 Other long term (current) drug therapy: Secondary | ICD-10-CM

## 2011-05-12 DIAGNOSIS — B20 Human immunodeficiency virus [HIV] disease: Secondary | ICD-10-CM

## 2011-05-12 DIAGNOSIS — Z113 Encounter for screening for infections with a predominantly sexual mode of transmission: Secondary | ICD-10-CM

## 2011-05-13 LAB — LIPID PANEL
Cholesterol: 213 mg/dL — ABNORMAL HIGH (ref 0–200)
HDL: 38 mg/dL — ABNORMAL LOW (ref >39)
Triglycerides: 245 mg/dL — ABNORMAL HIGH (ref <150)

## 2011-05-13 LAB — RPR

## 2011-05-14 LAB — HIV-1 RNA QUANT-NO REFLEX-BLD

## 2011-05-26 ENCOUNTER — Encounter: Payer: Self-pay | Admitting: Internal Medicine

## 2011-05-26 ENCOUNTER — Ambulatory Visit (INDEPENDENT_AMBULATORY_CARE_PROVIDER_SITE_OTHER): Payer: BC Managed Care – PPO | Admitting: Internal Medicine

## 2011-05-26 VITALS — BP 121/80 | HR 72 | Temp 98.4°F | Ht 62.0 in | Wt 229.5 lb

## 2011-05-26 DIAGNOSIS — B20 Human immunodeficiency virus [HIV] disease: Secondary | ICD-10-CM

## 2011-05-26 DIAGNOSIS — Z23 Encounter for immunization: Secondary | ICD-10-CM

## 2011-05-26 DIAGNOSIS — I10 Essential (primary) hypertension: Secondary | ICD-10-CM

## 2011-05-26 MED ORDER — ATENOLOL 25 MG PO TABS: 25.0000 mg | ORAL_TABLET | Freq: Every day | ORAL | Status: DC

## 2011-05-26 NOTE — Progress Notes (Signed)
  Subjective:    Patient ID: Autumn Curry, female    DOB: 21-Dec-1980, 30 y.o.   MRN: [id number]  HPI Kevonna is in for her routine visit. She's not had any significant problems or concerns since her last visit. Her headaches seem to be under better control. She missed 3 days of her Truvada and Kaletra in the late summer when she forgot to call the pharmacy and time and they were a little late delivering her medications. She does not believe that Ivin Booty has been tested for HIV since her last visit. They do use condoms consistently.    Review of Systems     Objective:   Physical Exam  Constitutional: No distress.  HENT:  Mouth/Throat: Oropharynx is clear and moist. No oropharyngeal exudate.  Cardiovascular: Normal rate, regular rhythm and normal heart sounds.   No murmur heard. Pulmonary/Chest: Effort normal and breath sounds normal. She has no rales.  Psychiatric: She has a normal mood and affect.   HIV 1 RNA Quant (copies/mL)  Date Value  05/12/2011 Comment: HIV 1 RNA not detected.   08/13/2010 <20 copies/mL   01/15/2010 <48 copies/mL      CD4 T Cell Abs (cmm)  Date Value  05/12/2011 680   08/13/2010 610   01/15/2010 490              Assessment & Plan:

## 2011-05-26 NOTE — Assessment & Plan Note (Signed)
Her infection remains under excellent control with a CD4 count of 680 and an undetectable viral load less than 20. I will continue her current regimen. She received her influenza vaccine today and will be scheduled for her annual Pap smear.

## 2011-08-21 ENCOUNTER — Encounter: Payer: Self-pay | Admitting: *Deleted

## 2011-08-21 ENCOUNTER — Other Ambulatory Visit: Payer: Self-pay | Admitting: *Deleted

## 2011-08-21 DIAGNOSIS — B20 Human immunodeficiency virus [HIV] disease: Secondary | ICD-10-CM

## 2011-08-21 MED ORDER — LOPINAVIR-RITONAVIR 200-50 MG PO TABS: 2.0000 | ORAL_TABLET | Freq: Two times a day (BID) | ORAL | Status: DC

## 2011-08-21 MED ORDER — LAMIVUDINE-ZIDOVUDINE 150-300 MG PO TABS: 1.0000 | ORAL_TABLET | Freq: Two times a day (BID) | ORAL | Status: DC

## 2011-09-30 ENCOUNTER — Encounter: Payer: Self-pay | Admitting: Internal Medicine

## 2011-09-30 ENCOUNTER — Ambulatory Visit (INDEPENDENT_AMBULATORY_CARE_PROVIDER_SITE_OTHER): Payer: BC Managed Care – PPO | Admitting: Internal Medicine

## 2011-09-30 ENCOUNTER — Telehealth: Payer: Self-pay | Admitting: Licensed Clinical Social Worker

## 2011-09-30 ENCOUNTER — Other Ambulatory Visit: Payer: Self-pay | Admitting: Licensed Clinical Social Worker

## 2011-09-30 VITALS — BP 140/92 | HR 67 | Temp 98.5°F | Ht 62.0 in | Wt 232.0 lb

## 2011-09-30 DIAGNOSIS — E669 Obesity, unspecified: Secondary | ICD-10-CM

## 2011-09-30 DIAGNOSIS — B20 Human immunodeficiency virus [HIV] disease: Secondary | ICD-10-CM

## 2011-09-30 DIAGNOSIS — I10 Essential (primary) hypertension: Secondary | ICD-10-CM

## 2011-09-30 DIAGNOSIS — R51 Headache: Secondary | ICD-10-CM

## 2011-09-30 LAB — COMPREHENSIVE METABOLIC PANEL
ALT: 17 U/L (ref 0–35)
AST: 17 U/L (ref 0–37)
Chloride: 104 mEq/L (ref 96–112)
Creat: 0.9 mg/dL (ref 0.50–1.10)
Sodium: 139 mEq/L (ref 135–145)
Total Bilirubin: 0.4 mg/dL (ref 0.3–1.2)
Total Protein: 7.2 g/dL (ref 6.0–8.3)

## 2011-09-30 LAB — RPR

## 2011-09-30 LAB — CBC
MCV: 101.4 fL — ABNORMAL HIGH (ref 78.0–100.0)
Platelets: 356 10*3/uL (ref 150–400)
RDW: 14 % (ref 11.5–15.5)
WBC: 5 10*3/uL (ref 4.0–10.5)

## 2011-09-30 LAB — LIPID PANEL
Total CHOL/HDL Ratio: 6 Ratio
VLDL: 56 mg/dL — ABNORMAL HIGH (ref 0–40)

## 2011-09-30 MED ORDER — SUMATRIPTAN SUCCINATE 50 MG PO TABS: 50.0000 mg | ORAL_TABLET | ORAL | Status: DC | PRN

## 2011-09-30 NOTE — Progress Notes (Signed)
Addended by: Jennet Maduro D on: 09/30/2011 12:35 PM   Modules accepted: Orders

## 2011-09-30 NOTE — Telephone Encounter (Signed)
Patient called today requesting an appointment because she has been experiencing swelling knees and and ankles for 4 days. Headaches and dizziness x 2 days. She stated she really felt bad today and wanted to be seen.

## 2011-09-30 NOTE — Progress Notes (Signed)
Patient ID: Autumn Curry, female   DOB: [birth year], 31 y.o.   MRN: [id number]  INFECTIOUS DISEASE PROGRESS NOTE    Subjective: Autumn Curry is seen a work in basis. She has had a little bit of right frontal headache for the past 24 hours and did not go to work yesterday. She also had some swelling of her right foot that was a little more than the normal edema she has at the end of each day. The swelling resolved spontaneously overnight. She has not had any fever, chills, or sweats. She has not believe she's missed any of her HIV medications. She states that she does not follow any particular diet and eats the food that she enjoys most. She does not get any regular exercise.  Objective: Temp: 98.5 F (36.9 C) (03/19 1015) Temp src: Oral (03/19 1015) BP: 140/92 mmHg (03/19 1015) Pulse Rate: 67  (03/19 1015)  General: Her BMI remains over 40 Skin: No rash Lungs: Clear Cor: Regular S1 and S2 no murmurs Abdomen: Soft and nontender, obese She has 1+ pitting edema of both ankles  Lab Results HIV 1 RNA Quant (copies/mL)  Date Value  05/12/2011 Comment: HIV 1 RNA not detected.   08/13/2010 <20 copies/mL   01/15/2010 <48 copies/mL      CD4 T Cell Abs (cmm)  Date Value  05/12/2011 680   08/13/2010 610   01/15/2010 490      Assessment: It appears that her adherence to her antiretroviral regimen remains good and her infection probably is under good control but I will repeat her lab work today.  I talked to her about lifestyle modification to try to help her lose weight. She states that she would prefer to weigh about 165 pounds. I will make a referral for her nutritional counseling.  Plan: 1. Continue current medications 2. Lab work today 3. Lifestyle modification   Cliffton Asters, MD [facility] for Infectious Diseases [facility] [phone] pager   [phone] cell 09/30/2011, 11:40 AM

## 2011-10-01 LAB — T-HELPER CELL (CD4) - (RCID CLINIC ONLY)
CD4 % Helper T Cell: 27 % — ABNORMAL LOW (ref 33–55)
CD4 T Cell Abs: 620 uL (ref 400–2700)

## 2011-10-02 LAB — HIV-1 RNA QUANT-NO REFLEX-BLD: HIV 1 RNA Quant: <20 copies/mL (ref <20)

## 2011-10-15 ENCOUNTER — Telehealth: Payer: Self-pay | Admitting: *Deleted

## 2011-10-15 NOTE — Telephone Encounter (Signed)
RN called pt to remind her of appt for Nutrition/Life Style Counseling @ [facility].  Pt stated that she was glad that RN had called because her work was scheduled for a "field trip" that day and she would need to reschedule the appt.  RN gave pt the phone # to call to reschedule the appt.

## 2011-10-23 ENCOUNTER — Ambulatory Visit: Payer: BC Managed Care – PPO | Admitting: Dietician

## 2011-10-28 ENCOUNTER — Telehealth: Payer: Self-pay | Admitting: *Deleted

## 2011-10-28 NOTE — Telephone Encounter (Signed)
Rescheduled appt to 11/21/11.

## 2011-11-04 ENCOUNTER — Ambulatory Visit: Payer: BC Managed Care – PPO | Admitting: Internal Medicine

## 2011-11-04 ENCOUNTER — Ambulatory Visit (INDEPENDENT_AMBULATORY_CARE_PROVIDER_SITE_OTHER): Payer: BC Managed Care – PPO | Admitting: Internal Medicine

## 2011-11-04 ENCOUNTER — Encounter: Payer: Self-pay | Admitting: Internal Medicine

## 2011-11-04 VITALS — BP 125/86 | HR 88 | Temp 98.5°F | Wt 232.8 lb

## 2011-11-04 DIAGNOSIS — B20 Human immunodeficiency virus [HIV] disease: Secondary | ICD-10-CM

## 2011-11-04 NOTE — Progress Notes (Signed)
Patient ID: Autumn Curry, female   DOB: [birth year], 31 y.o.   MRN: [id number]  INFECTIOUS DISEASE PROGRESS NOTE    Subjective: Autumn Curry is in for her routine visit. She has not missed any doses of her Combivir and Kaletra. He is also taking her atenolol. The swelling in her feet has improved but she still has a little bit of soreness on the top of her right foot. She has been doing 30 minutes of vigorous exercise for the past 2 weeks. She states she is trying to eat healthier meals. She was unable to make her appointment with the nutritionist but hopes to reschedule soon.  Objective: Temp: 98.5 F (36.9 C) (04/23 1422) Temp src: Oral (04/23 1422) BP: 125/86 mmHg (04/23 1422) Pulse Rate: 88  (04/23 1422)  General: Her body mass index remains over 40. She is in good spirits. Skin: No rash Lungs: Clear Cor: reg S1 and S2 without murmurs Abdomen: obese, soft and non-tender Right foot: As a little bit of point tenderness over the right fifth metatarsal base but has no other signs of inflammation.  Lab Results HIV 1 RNA Quant (copies/mL)  Date Value  09/30/2011 <20   05/12/2011 Comment: HIV 1 RNA not detected.   08/13/2010 <20 copies/mL      CD4 T Cell Abs (cmm)  Date Value  09/30/2011 620   05/12/2011 680   08/13/2010 610      Assessment: Her HIV remains under excellent control. I will continue her current regimen of Combivir and Kaletra. I encouraged her to continue working on lifestyle modification to help with her obesity, hypertension and dyslipidemia. I may need to consider altering her Kaletra based regimen if her lipids remain elevated.  Plan: 1. Continue current regimen 2. Lifestyle modification 3. Return to clinic after lab work in 6 months   Cliffton Asters, MD [facility] for Infectious Diseases [facility] [phone] pager   [phone] cell 11/04/2011, 2:48 PM

## 2011-11-07 ENCOUNTER — Ambulatory Visit: Payer: BC Managed Care – PPO

## 2011-11-10 ENCOUNTER — Ambulatory Visit: Payer: BC Managed Care – PPO | Admitting: Dietician

## 2011-11-19 ENCOUNTER — Ambulatory Visit (INDEPENDENT_AMBULATORY_CARE_PROVIDER_SITE_OTHER): Payer: BC Managed Care – PPO | Admitting: Dietician

## 2011-11-19 VITALS — Ht 62.0 in | Wt 232.3 lb

## 2011-11-19 DIAGNOSIS — E669 Obesity, unspecified: Secondary | ICD-10-CM

## 2011-11-19 NOTE — Progress Notes (Signed)
Medical Nutrition Therapy:  Appt start time: 1420 end time:  1512.  Assessment:  Primary concerns today: Weight management.  Usual eating pattern includes 3 meals and 2 snacks per day. Deficit of vegetable intake, Uses meal replacement once daily, eats excessive calories coming form both added sugars and fat. Eats fired foods often and eats out mostly on weekend at fast food. Everyday foods include juice, milk.   Usual physical activity includes takes stairs at work, exercises occassionally at work and used to walk 1 milk a day. 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 She is responsbile for the cooking and shopping in her household and reports that finances impact her food decision. .  Progress Towards Goal(s):  In progress.   Nutritional Diagnosis:   NB-1.1 Food and nutrition-related knowledge deficit As related to lack of previous exposure to nutrition information  As evidenced by her report and questions.    Intervention:   1- Nutrition Education about weight loss. 2- Goal setting- assisted patient in setting appropriate goals. 3- Motivational interviewing used to assist patient with identifying behavior changes.  Monitoring/Evaluation:  Dietary intake, exercise, knowledge retention, and body weight in 3 week(s).

## 2011-11-19 NOTE — Patient Instructions (Signed)
Please keep 3 day food journal of 2 weekday and and weekend day.  Your Goal for the next 3 weeks is try getting unsweet tea when you eat out to try to decrease your calories.   Suggest reading first 5 pages in Small steps big rewards.  Make a follow up in 3 weeks.

## 2011-11-21 ENCOUNTER — Telehealth: Payer: Self-pay | Admitting: *Deleted

## 2011-11-21 ENCOUNTER — Ambulatory Visit: Payer: BC Managed Care – PPO

## 2011-11-21 NOTE — Telephone Encounter (Signed)
Message left requesting pt call [facility] for a new PAP smear appt.

## 2011-12-04 ENCOUNTER — Encounter: Payer: Self-pay | Admitting: *Deleted

## 2011-12-17 ENCOUNTER — Encounter: Payer: BC Managed Care – PPO | Admitting: Dietician

## 2012-02-13 ENCOUNTER — Ambulatory Visit (INDEPENDENT_AMBULATORY_CARE_PROVIDER_SITE_OTHER): Payer: BC Managed Care – PPO | Admitting: *Deleted

## 2012-02-13 ENCOUNTER — Encounter: Payer: Self-pay | Admitting: *Deleted

## 2012-02-13 ENCOUNTER — Other Ambulatory Visit (HOSPITAL_COMMUNITY)
Admission: RE | Admit: 2012-02-13 | Discharge: 2012-02-13 | Disposition: A | Discharge status: Home / Self Care | Payer: BC Managed Care – PPO | Source: Ambulatory Visit | Attending: Infectious Disease | Admitting: Infectious Disease

## 2012-02-13 DIAGNOSIS — Z01419 Encounter for gynecological examination (general) (routine) without abnormal findings: Secondary | ICD-10-CM | POA: Insufficient documentation

## 2012-02-13 DIAGNOSIS — Z124 Encounter for screening for malignant neoplasm of cervix: Secondary | ICD-10-CM

## 2012-02-13 NOTE — Progress Notes (Signed)
Patient ID: Autumn Curry, female   DOB: Oct 18, 1980, 31 y.o.   MRN: [id number] Pt requesting prescription for acyclovir for genital herpes outbreak prior to menses.  Previously treated 2008-2009.  Started having outbreaks prior to menses several months ago.  MD please advise.

## 2012-02-13 NOTE — Patient Instructions (Signed)
Will call patient after response from MD about prescription.  Pt uses [facility] on [address] for local medications.

## 2012-02-13 NOTE — Progress Notes (Signed)
  Subjective:     Autumn Curry is a 31 y.o. woman who comes in today for a  pap smear only. Contraception: condoms.  Pt stated that she is starting to have herpes outbreaks prior to menses.  Has history of gential herpes, 2008-2009.  Requesting refill of medication.  Pt interested in having another child.  RN advised to discuss this with Dr. Orvan Falconer at her next office visit.    Objective:   LMP 02/06/12. Pelvic Exam: Pap smear obtained.   Assessment:    Screening pap smear.  No herpes lesions observed.  Plan:    Follow up in one year, or as indicated by Pap results.  Pt was given educational materials re:  BSE, heart disease and HIV, PAP smears, HIV care, ACA, Protecting your partner and self-esteem.  Pt given condoms.

## 2012-02-13 NOTE — Patient Instructions (Signed)
Your results will be ready in about a week.  I will mail them to you.  Thank you for coming to the Center for your care.  Faryal Marxen,  RN 

## 2012-02-16 ENCOUNTER — Encounter: Payer: Self-pay | Admitting: Internal Medicine

## 2012-02-16 DIAGNOSIS — A6000 Herpesviral infection of urogenital system, unspecified: Secondary | ICD-10-CM | POA: Insufficient documentation

## 2012-02-16 NOTE — Progress Notes (Signed)
Patient ID: Autumn Curry, female   DOB: Mar 29, 1981, 31 y.o.   MRN: [id number]  I will prescribe valacyclovir for as needed use.

## 2012-02-18 ENCOUNTER — Encounter: Payer: Self-pay | Admitting: *Deleted

## 2012-03-08 ENCOUNTER — Other Ambulatory Visit: Payer: Self-pay | Admitting: *Deleted

## 2012-03-08 DIAGNOSIS — B009 Herpesviral infection, unspecified: Secondary | ICD-10-CM

## 2012-03-08 MED ORDER — VALACYCLOVIR HCL 500 MG PO TABS: 500.0000 mg | ORAL_TABLET | Freq: Two times a day (BID) | ORAL | Status: DC

## 2012-04-21 ENCOUNTER — Other Ambulatory Visit: Payer: BC Managed Care – PPO

## 2012-04-21 DIAGNOSIS — B20 Human immunodeficiency virus [HIV] disease: Secondary | ICD-10-CM

## 2012-04-22 LAB — LIPID PANEL
Cholesterol: 217 mg/dL — ABNORMAL HIGH (ref 0–200)
HDL: 39 mg/dL — ABNORMAL LOW
LDL Cholesterol: 133 mg/dL — ABNORMAL HIGH (ref 0–99)
Total CHOL/HDL Ratio: 5.6 ratio
Triglycerides: 225 mg/dL — ABNORMAL HIGH
VLDL: 45 mg/dL — ABNORMAL HIGH (ref 0–40)

## 2012-04-22 LAB — T-HELPER CELL (CD4) - (RCID CLINIC ONLY)
CD4 % Helper T Cell: 28 % — ABNORMAL LOW (ref 33–55)
CD4 T Cell Abs: 700 uL (ref 400–2700)

## 2012-04-22 LAB — HIV-1 RNA QUANT-NO REFLEX-BLD
HIV 1 RNA Quant: <20 {copies}/mL
HIV-1 RNA Quant, Log: <1.3 {Log}

## 2012-05-05 ENCOUNTER — Ambulatory Visit (INDEPENDENT_AMBULATORY_CARE_PROVIDER_SITE_OTHER): Payer: BC Managed Care – PPO | Admitting: Internal Medicine

## 2012-05-05 VITALS — BP 134/83 | HR 74 | Temp 98.4°F | Wt 228.0 lb

## 2012-05-05 DIAGNOSIS — E785 Hyperlipidemia, unspecified: Secondary | ICD-10-CM

## 2012-05-05 DIAGNOSIS — B20 Human immunodeficiency virus [HIV] disease: Secondary | ICD-10-CM

## 2012-05-05 DIAGNOSIS — Z23 Encounter for immunization: Secondary | ICD-10-CM

## 2012-05-05 NOTE — Progress Notes (Signed)
Patient ID: Autumn Curry, female   DOB: Jun 02, 1981, 31 y.o.   MRN: [id number]     [facility] for Infectious Disease  Patient Active Problem List  Diagnosis  . HIV DISEASE  . SYPHILIS NOS  . AMENORRHEA  . BOILS, RECURRENT  . HEADACHE  . Hypertension  . Obesity  . Genital herpes  . Dyslipidemia    Patient's Medications  New Prescriptions   No medications on file  Previous Medications   ATENOLOL (TENORMIN) 25 MG TABLET    Take 1 tablet (25 mg total) by mouth daily. Take 25 mg by mouth daily.   LAMIVUDINE-ZIDOVUDINE (COMBIVIR) 150-300 MG PER TABLET    Take 1 tablet by mouth 2 (two) times daily.   LOPINAVIR-RITONAVIR (KALETRA) 200-50 MG PER TABLET    Take 2 tablets by mouth 2 (two) times daily.   MULTIPLE VITAMINS-MINERALS (ADEKS) CHEWABLE TABLET    Chew 1 tablet by mouth daily.     SUMATRIPTAN (IMITREX) 50 MG TABLET    Take 1 tablet (50 mg total) by mouth every 2 (two) hours as needed for migraine.   VALACYCLOVIR (VALTREX) 500 MG TABLET    Take 1 tablet (500 mg total) by mouth 2 (two) times daily. 1 tablet by mouth twice daily for 5 days as needed  Modified Medications   No medications on file  Discontinued Medications   No medications on file    Subjective: Autumn Curry is in for her routine visit. She denies missing any doses of her medications since her last visit. She is trying to get some regular exercise in hopes to lose weight but has not made any changes to her diet. She does not have a primary care physician.  Objective: Temp: 98.4 F (36.9 C) (10/23 1506) Temp src: Oral (10/23 1506) BP: 134/83 mmHg (10/23 1506) Pulse Rate: 74  (10/23 1506)  General: She is in good spirits. Her weight is down slightly better her body mass index remains over 41 Skin: No rash Lungs: Clear Cor:  Regular S1-S2 no murmurs Abdomen: Obese, soft and nontender   Lab Results HIV 1 RNA Quant (copies/mL)  Date Value  04/21/2012 <20   09/30/2011 <20   05/12/2011 Comment: HIV 1 RNA not  detected.      CD4 T Cell Abs (cmm)  Date Value  04/21/2012 700   09/30/2011 620   05/12/2011 680    Lab Results  Component Value Date   CHOL 217* 04/21/2012   HDL 39* 04/21/2012   LDLCALC 133* 04/21/2012   TRIG 225* 04/21/2012   CHOLHDL 5.6 04/21/2012      Assessment: Her HIV infection remains under excellent control. I will continue her current antiretroviral regimen.  She remains morbidly obese with a blood pressure and dyslipidemia. I talked to her again about lifestyle modification.  Plan: 1. Continue current medications 2. Lifestyle modification 3. Influenza vaccination today 4. Followup after lab work in 6 months   Cliffton Asters, MD [facility] for Infectious Disease [facility] [phone] pager   [phone] cell 05/05/2012, 3:15 PM

## 2012-08-18 ENCOUNTER — Other Ambulatory Visit: Payer: Self-pay | Admitting: *Deleted

## 2012-08-18 DIAGNOSIS — I10 Essential (primary) hypertension: Secondary | ICD-10-CM

## 2012-08-18 DIAGNOSIS — B20 Human immunodeficiency virus [HIV] disease: Secondary | ICD-10-CM

## 2012-08-18 MED ORDER — ATENOLOL 25 MG PO TABS: 25.0000 mg | ORAL_TABLET | Freq: Every day | ORAL | Status: DC

## 2012-08-18 MED ORDER — LOPINAVIR-RITONAVIR 200-50 MG PO TABS: 2.0000 | ORAL_TABLET | Freq: Two times a day (BID) | ORAL | Status: DC

## 2012-08-18 MED ORDER — LAMIVUDINE-ZIDOVUDINE 150-300 MG PO TABS: 1.0000 | ORAL_TABLET | Freq: Two times a day (BID) | ORAL | Status: DC

## 2012-08-30 ENCOUNTER — Telehealth: Payer: Self-pay | Admitting: *Deleted

## 2012-08-30 DIAGNOSIS — B20 Human immunodeficiency virus [HIV] disease: Secondary | ICD-10-CM

## 2012-08-30 MED ORDER — LOPINAVIR-RITONAVIR 200-50 MG PO TABS: 2.0000 | ORAL_TABLET | Freq: Two times a day (BID) | ORAL | Status: DC

## 2012-08-30 MED ORDER — LAMIVUDINE-ZIDOVUDINE 150-300 MG PO TABS: 1.0000 | ORAL_TABLET | Freq: Two times a day (BID) | ORAL | Status: DC

## 2012-08-30 NOTE — Telephone Encounter (Signed)
Needs medications called in to mail order pharmacy - [facility] notified her they were mistakenly sent to them.  Rx's reordered. Andree Coss, RN

## 2012-11-02 ENCOUNTER — Other Ambulatory Visit: Payer: Self-pay | Admitting: Infectious Diseases

## 2012-11-02 ENCOUNTER — Other Ambulatory Visit: Payer: BC Managed Care – PPO

## 2012-11-02 DIAGNOSIS — B20 Human immunodeficiency virus [HIV] disease: Secondary | ICD-10-CM

## 2012-11-02 LAB — CBC
Platelets: 322 10*3/uL (ref 150–400)
RBC: 3.36 MIL/uL — ABNORMAL LOW (ref 3.87–5.11)
RDW: 15.3 % (ref 11.5–15.5)
WBC: 5.7 10*3/uL (ref 4.0–10.5)

## 2012-11-03 LAB — COMPREHENSIVE METABOLIC PANEL
ALT: 18 U/L (ref 0–35)
BUN: 13 mg/dL (ref 6–23)
CO2: 26 mEq/L (ref 19–32)
Calcium: 9.4 mg/dL (ref 8.4–10.5)
Chloride: 104 mEq/L (ref 96–112)
Creat: 0.84 mg/dL (ref 0.50–1.10)

## 2012-11-03 LAB — T-HELPER CELL (CD4) - (RCID CLINIC ONLY): CD4 T Cell Abs: 910 uL (ref 400–2700)

## 2012-11-03 LAB — LIPID PANEL
Cholesterol: 190 mg/dL (ref 0–200)
Total CHOL/HDL Ratio: 4.5 Ratio

## 2012-11-04 LAB — HIV-1 RNA QUANT-NO REFLEX-BLD
HIV 1 RNA Quant: <20 copies/mL (ref <20)
HIV-1 RNA Quant, Log: <1.3 {Log} (ref <1.30)

## 2012-11-16 ENCOUNTER — Ambulatory Visit (INDEPENDENT_AMBULATORY_CARE_PROVIDER_SITE_OTHER): Payer: BC Managed Care – PPO | Admitting: Internal Medicine

## 2012-11-16 VITALS — BP 140/89 | HR 72 | Temp 97.4°F | Ht 62.0 in | Wt 228.2 lb

## 2012-11-16 DIAGNOSIS — B009 Herpesviral infection, unspecified: Secondary | ICD-10-CM

## 2012-11-16 DIAGNOSIS — B20 Human immunodeficiency virus [HIV] disease: Secondary | ICD-10-CM

## 2012-11-16 MED ORDER — EMTRICITABINE-TENOFOVIR DF 200-300 MG PO TABS: 1.0000 | ORAL_TABLET | Freq: Every day | ORAL | Status: DC

## 2012-11-16 MED ORDER — DARUNAVIR ETHANOLATE 800 MG PO TABS: 800.0000 mg | ORAL_TABLET | Freq: Every day | ORAL | Status: DC

## 2012-11-16 MED ORDER — VALACYCLOVIR HCL 500 MG PO TABS: 500.0000 mg | ORAL_TABLET | Freq: Two times a day (BID) | ORAL | Status: DC

## 2012-11-16 MED ORDER — RITONAVIR 100 MG PO TABS: 100.0000 mg | ORAL_TABLET | Freq: Every day | ORAL | Status: DC

## 2012-11-16 NOTE — Progress Notes (Signed)
Patient ID: Autumn Curry, female   DOB: Mar 08, 1981, 32 y.o.   MRN: [id number]          [facility]  Patient Active Problem List   Diagnosis Date Noted  . Dyslipidemia 05/05/2012    Priority: High  . Hypertension 09/30/2011    Priority: High  . HIV DISEASE 09/03/2006    Priority: High  . Obesity 09/30/2011    Priority: Medium  . Genital herpes 02/16/2012  . 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 BOILS, RECURRENT 08/31/2007  . HEADACHE 08/31/2007  . SYPHILIS NOS 09/11/2006    Patient's Medications  New Prescriptions   DARUNAVIR ETHANOLATE (PREZISTA) 800 MG TABLET    Take 1 tablet (800 mg total) by mouth daily with breakfast.   EMTRICITABINE-TENOFOVIR (TRUVADA) 200-300 MG PER TABLET    Take 1 tablet by mouth daily.   RITONAVIR (NORVIR) 100 MG TABS    Take 1 tablet (100 mg total) by mouth daily.  Previous Medications   ATENOLOL (TENORMIN) 25 MG TABLET    Take 1 tablet (25 mg total) by mouth daily. Take 25 mg by mouth daily.   MULTIPLE VITAMINS-MINERALS (ADEKS) CHEWABLE TABLET    Chew 1 tablet by mouth daily.     SUMATRIPTAN (IMITREX) 50 MG TABLET    Take 1 tablet (50 mg total) by mouth every 2 (two) hours as needed for migraine.  Modified Medications   Modified Medication Previous Medication   VALACYCLOVIR (VALTREX) 500 MG TABLET valACYclovir (VALTREX) 500 MG tablet      Take 1 tablet (500 mg total) by mouth 2 (two) times daily. 1 tablet by mouth twice daily for 5 days as needed    Take 1 tablet (500 mg total) by mouth 2 (two) times daily. 1 tablet by mouth twice daily for 5 days as needed  Discontinued Medications   LAMIVUDINE-ZIDOVUDINE (COMBIVIR) 150-300 MG PER TABLET    Take 1 tablet by mouth 2 (two) times daily.   LOPINAVIR-RITONAVIR (KALETRA) 200-50 MG PER TABLET    Take 2 tablets by mouth 2 (two) times daily.    Subjective:  Autumn Curry is in for her routine visit. She denies missing any doses of her medications. She lost 14 pounds earlier this year but  regained most of it. She is not getting any regular exercise. She states that her husband, Autumn Curry, still wants to have children and they're still trying their own home version of in vitro fertilization. He does use condoms whenever they are sexually active. She has been bothered by some swelling around ankles after she has been standing for several hours.  Objective: Temp: 97.4 F (36.3 C) (05/06 0846) Temp src: Oral (05/06 0846) BP: 140/89 mmHg (05/06 0846) Pulse Rate: 72 (05/06 0846)  General: Her body mass index is still over 40 Skin: No rash Oral: Thrush or other lesions Lungs: Clear Cor: Regular S1-S2 no murmurs Abdomen: Obese, soft and nontender Joints and extremities: No edema or other acute abnormalities  Lab Results HIV 1 RNA Quant (copies/mL)  Date Value  11/02/2012 <20   04/21/2012 <20   09/30/2011 <20      CD4 T Cell Abs (cmm)  Date Value  11/02/2012 910   04/21/2012 700   09/30/2011 620    BMET    Component Value Date/Time   NA 137 11/02/2012 1504   K 4.1 11/02/2012 1504   CL 104 11/02/2012 1504   CO2 26 11/02/2012 1504   GLUCOSE 79 11/02/2012 1504   BUN 13 11/02/2012 1504  CREATININE 0.84 11/02/2012 1504   CREATININE 0.87 08/13/2010 2133   CALCIUM 9.4 11/02/2012 1504   Lab Results  Component Value Date   ALT 18 11/02/2012   AST 16 11/02/2012   ALKPHOS 70 11/02/2012   BILITOT 0.5 11/02/2012   Lab Results  Component Value Date   WBC 5.7 11/02/2012   HGB 11.6* 11/02/2012   HCT 33.0* 11/02/2012   MCV 98.2 11/02/2012   PLT 322 11/02/2012   Lab Results  Component Value Date   CHOL 190 11/02/2012   HDL 42 11/02/2012   LDLCALC 104* 11/02/2012   TRIG 221* 11/02/2012   CHOLHDL 4.5 11/02/2012     Assessment: Her HIV infection remains under excellent control. She used to take Atripla but was switched to Combivir and Kaletra in 2008 when she was attempting to get pregnant. I will change her to Truvada, Prezista and Norvir to simplify her regimen and hopefully improve her  lipid profile.  She has borderline hypertension, morbid obesity and dyslipidemia. Talk to her at length about lifestyle modification. She is accepting of a nutrition referral.  Plan: 1. Change antiretroviral regimen to Truvada, Prezista and Norvir 2. Nutrition referral 3. Counseling for lifestyle modification 4. Followup here after blood work in 6 months   Cliffton Asters, MD [facility] for Infectious Disease [facility] [phone] pager   [phone] cell 11/16/2012, 9:13 AM

## 2012-11-16 NOTE — Progress Notes (Signed)
HPI: Autumn Curry is a 32 y.o. female with HIV who is here for her follow up visit.  Allergies: No Known Allergies  Vitals: Temp: 97.4 F (36.3 C) (05/06 0846) Temp src: Oral (05/06 0846) BP: 140/89 mmHg (05/06 0846) Pulse Rate: 72 (05/06 0846)  Past Medical History: Past Medical History  Diagnosis Date  . Screening for malignant neoplasm of the cervix   . Routine general medical examination at a health care facility   . Absence of menstruation   . Carbuncle and furuncle of unspecified site   . Headache   . Blood pressure elevated without history of HTN   . Syphilis, unspecified   . 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 Encounter for long-term (current) use of other medications     Social History: History   Social History  . Marital Status: Married    Spouse Name: N/A    Number of Children: N/A  . Years of Education: N/A   Social History Main Topics  . Smoking status: Never Smoker   . Smokeless tobacco: Never Used  . Alcohol Use: No  . Drug Use: No  . Sexually Active: Yes -- Female partner(s)    Birth Control/ Protection: Condom     Comment: given condoms   Other Topics Concern  . Not on file   Social History Narrative  . No narrative on file    Previous Regimen: Atripla  Current Regimen: Kaletra + Combivir  Labs: HIV 1 RNA Quant (copies/mL)  Date Value  11/02/2012 <20   04/21/2012 <20   09/30/2011 <20      CD4 T Cell Abs (cmm)  Date Value  11/02/2012 910   04/21/2012 700   09/30/2011 620      Hep B S Ab (no units)  Date Value  09/11/2006 Negative      Hepatitis B Surface Ag (no units)  Date Value  09/11/2006 Negative      HCV Ab (no units)  Date Value  09/11/2006 Negative     CrCl: Estimated Creatinine Clearance: 108.5 ml/min (by C-G formula based on Cr of 0.84).  Lipids:    Component Value Date/Time   CHOL 190 11/02/2012 1504   TRIG 221* 11/02/2012 1504   HDL 42 11/02/2012 1504   CHOLHDL 4.5 11/02/2012 1504   VLDL 44* 11/02/2012 1504    LDLCALC 104* 11/02/2012 1504    Assessment: 32 yo with well controlled HIV on her current regimen. He adherence has been good. She was switched to this current regimen a while back when she was trying to have a baby. She also has issues with dyslipidemia which is like caused by [place]. I discussed the options with her about switching her current regimen to boosted darunavir + truvada to make this a once a day regimen. She is not sure about having kids but this regimen would be safe if she does decide to have kids. This would also help with regulating lipids.  Recommendations: Dc kaletra/combivir Start DRV/r + Truvada  Clide Cliff, PharmD Clinical Infectious Disease Pharmacist [facility] for Infectious Disease 11/16/2012, 9:45 AM

## 2012-11-22 ENCOUNTER — Ambulatory Visit: Payer: BC Managed Care – PPO | Admitting: *Deleted

## 2012-11-22 NOTE — Progress Notes (Signed)
HPI: Autumn Curry is a 32 y.o. female with HIV who is here for her follow up visit.   Allergies: No Known Allergies  Vitals:    Past Medical History: Past Medical History  Diagnosis Date  . Screening for malignant neoplasm of the cervix   . Routine general medical examination at a health care facility   . Absence of menstruation   . Carbuncle and furuncle of unspecified site   . Headache   . Elevated blood pressure reading without diagnosis of hypertension   . Syphilis, unspecified   . 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 Encounter for long-term (current) use of other medications     Social History: History   Social History  . Marital Status: Married    Spouse Name: N/A    Number of Children: N/A  . Years of Education: N/A   Social History Main Topics  . Smoking status: Never Smoker   . Smokeless tobacco: Never Used  . Alcohol Use: No  . Drug Use: No  . Sexually Active: Yes -- Female partner(s)    Birth Control/ Protection: Condom     Comment: given condoms   Other Topics Concern  . Not on file   Social History Narrative  . No narrative on file    Current Regimen: Kaletra + combivir  Labs: HIV 1 RNA Quant (copies/mL)  Date Value  11/02/2012 <20   04/21/2012 <20   09/30/2011 <20      CD4 T Cell Abs (cmm)  Date Value  11/02/2012 910   04/21/2012 700   09/30/2011 620      Hep B S Ab (no units)  Date Value  09/11/2006 Negative      Hepatitis B Surface Ag (no units)  Date Value  09/11/2006 Negative      HCV Ab (no units)  Date Value  09/11/2006 Negative     CrCl: Estimated Creatinine Clearance: 108.5 ml/min (by C-G formula based on Cr of 0.84).  Lipids:    Component Value Date/Time   CHOL 190 11/02/2012 1504   TRIG 221* 11/02/2012 1504   HDL 42 11/02/2012 1504   CHOLHDL 4.5 11/02/2012 1504   VLDL 44* 11/02/2012 1504   LDLCALC 104* 11/02/2012 1504    Assessment: 31 with well controlled HIV. Her VL is undetectable and her CD4 is >900. She was  switched over this regimen a while ago with the anticipation that she could get pregnant. She never did have a baby. Previously she was on Atripla and was doing well on that too. Her lipids is not well controlled likely due to [place]. I offered her a simplification regimen and she agreed to change her therapy. Since she may still have a baby, we'll avoid using atripla. Will keep her on a boosted PI but change to a once daily regimen.   Recommendations: Dc kaletra/combivir Start Prezista/r + Truvada  Clide Cliff, PharmD Clinical Infectious Disease Pharmacist [facility] 11/22/2012, 11:48 PM

## 2012-11-22 NOTE — Patient Instructions (Addendum)
Goals:  Limit carbohydrate 2-3 servings (30-45g)/meal and 1 serving (15g)/snack   Choose more whole grains, lean protein, low-fat dairy, and fruits/non-starchy vegetables.   Try Dannon Light & Fit [place] or Yoplait Greek yogurts  Aim for >30 min of physical activity daily as able  Limit sugar-sweetened beverages and concentrated sweets

## 2012-11-22 NOTE — Progress Notes (Signed)
  Medical Nutrition Therapy:  Appt start time: 1500 end time:  1600.  Assessment:  Weight Management.  Autumn Curry comes today for help with wt management.  Food recall reveals highly excessive CHO intake, though good food choices overall. States she has been working on this. 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  Reports she has a net wt loss of 7 lbs since Jan 2014 on her own (1/14: 236 lbs; 2/14: 224 lbs).   MEDICATIONS:   Pt states she will start new Rx (Prezista, Truvada, Norvir) once finished with old medications.    DIETARY INTAKE:  Usual eating pattern includes 3 meals and 2 snacks per day.  24-hr recall:  B ( AM): Atkins shake OR Special K cereal with 5 oz milk and 4 oz juice or fruit   Snk ( AM): SF Jello OR 2-3 oz walnut/raisin mix  L ( PM): 1 cup "pwerhouse chili" (at school) - dark red kidney bean, ground beef, tomatoes w/ 1/2 cup rice and 5 oz pineapple Snk ( PM): 1 pkg "Nut-rition" nuts (Plantars) D ( PM): [place] burger w/ whole wheat bun, 1/2 cup baked beans, 1/2 cup Amish macaroni salad Snk ( PM): NONE Beverages: Atkins shakes, 100% Juicy Juice, sweet tea/soda (when out to eat 1 time/week), water  Usual physical activity:  Tae bo or Rowe Robert exercise tape 3 days/week @ 30-45 min   Estimated energy needs: 1500 calories (for wt loss) 190 g carbohydrates  Progress Towards Goal(s):  In progress.   Nutritional Diagnosis:  [place]-3.3 Overweight/obesity related to excessive CHO intake as evidenced by patient reported 24 hr dietary recall and a BMI of 42.0 kg/m^2.    Intervention:  Nutrition education including carb metabolism and carb counting.   Handouts given during visit include:  Cholesterol & Triglycerides handout  Meal Planning Card  Monitoring/Evaluation:  Dietary intake, exercise, and body weight prn.

## 2013-02-09 ENCOUNTER — Encounter (HOSPITAL_COMMUNITY): Payer: Self-pay | Admitting: *Deleted

## 2013-02-09 DIAGNOSIS — Z21 Asymptomatic human immunodeficiency virus [HIV] infection status: Secondary | ICD-10-CM | POA: Insufficient documentation

## 2013-02-09 DIAGNOSIS — Z872 Personal history of diseases of the skin and subcutaneous tissue: Secondary | ICD-10-CM | POA: Insufficient documentation

## 2013-02-09 DIAGNOSIS — R220 Localized swelling, mass and lump, head: Secondary | ICD-10-CM | POA: Insufficient documentation

## 2013-02-09 DIAGNOSIS — Z79899 Other long term (current) drug therapy: Secondary | ICD-10-CM | POA: Insufficient documentation

## 2013-02-09 DIAGNOSIS — L299 Pruritus, unspecified: Secondary | ICD-10-CM | POA: Insufficient documentation

## 2013-02-09 DIAGNOSIS — Z8679 Personal history of other diseases of the circulatory system: Secondary | ICD-10-CM | POA: Insufficient documentation

## 2013-02-09 DIAGNOSIS — Z8742 Personal history of other diseases of the female genital tract: Secondary | ICD-10-CM | POA: Insufficient documentation

## 2013-02-09 NOTE — ED Notes (Signed)
Since yesterday she has had itching on her head and ears and some sl lip swelling today .  She has taken benadryl. No breathing difficulty.  She thinks it may have been a product used on her hair and she washed that out today

## 2013-02-10 ENCOUNTER — Emergency Department (HOSPITAL_COMMUNITY)
Admission: EM | Admit: 2013-02-10 | Discharge: 2013-02-10 | Disposition: A | Discharge status: Home / Self Care | Payer: BC Managed Care – PPO | Attending: Emergency Medicine | Admitting: Emergency Medicine

## 2013-02-10 DIAGNOSIS — T7840XA Allergy, unspecified, initial encounter: Secondary | ICD-10-CM

## 2013-02-10 MED ORDER — FAMOTIDINE IN NACL 20-0.9 MG/50ML-% IV SOLN
20.0000 mg | Freq: Once | INTRAVENOUS | Status: AC
  Administered 2013-02-10: 20 mg via INTRAVENOUS
  Filled 2013-02-10: qty 50

## 2013-02-10 MED ORDER — HYDROXYZINE HCL 25 MG PO TABS: 25.0000 mg | ORAL_TABLET | Freq: Four times a day (QID) | ORAL | Status: DC | PRN

## 2013-02-10 MED ORDER — DIPHENHYDRAMINE HCL 25 MG PO TABS: 50.0000 mg | ORAL_TABLET | ORAL | Status: DC | PRN

## 2013-02-10 MED ORDER — SODIUM CHLORIDE 0.9 % IV SOLN
Freq: Once | INTRAVENOUS | Status: AC
  Administered 2013-02-10: 01:00:00 via INTRAVENOUS

## 2013-02-10 MED ORDER — DEXAMETHASONE SODIUM PHOSPHATE 10 MG/ML IJ SOLN
10.0000 mg | Freq: Once | INTRAMUSCULAR | Status: AC
  Administered 2013-02-10: 10 mg via INTRAVENOUS
  Filled 2013-02-10: qty 1

## 2013-02-10 MED ORDER — FAMOTIDINE 20 MG PO TABS: 40.0000 mg | ORAL_TABLET | Freq: Every evening | ORAL | Status: DC | PRN

## 2013-02-10 MED ORDER — DIPHENHYDRAMINE HCL 50 MG/ML IJ SOLN
25.0000 mg | Freq: Once | INTRAMUSCULAR | Status: AC
  Administered 2013-02-10: 25 mg via INTRAVENOUS
  Filled 2013-02-10: qty 1

## 2013-02-10 NOTE — ED Provider Notes (Signed)
Medical screening examination/treatment/procedure(s) were performed by non-physician practitioner and as supervising physician I was immediately available for consultation/collaboration.  Lyanne Co, MD 02/10/13 [phone]

## 2013-02-10 NOTE — ED Provider Notes (Signed)
CSN: [id number]     Arrival date & time 02/09/13  2026 History     First MD Initiated Contact with Patient 02/10/13 0012     Chief Complaint  Patient presents with  . Allergic Reaction   (Consider location/radiation/quality/duration/timing/severity/associated sxs/prior Treatment) HPI  Autumn Curry is a 32 y.o. female past medical history significant for HIV (last CD4 count was 700 April of 2014) complaining of pruritic sensation behind his left ear and in her head starting yesterday. Patient also reports upper lip swelling starting today along with hives that are asymptomatic, no pruritus. Patient denies wheezing, tongue swelling, shortness of breath, fever. Patient has history of similar prior episodes, does not know what she is allergic to. She has not started any new medications, shampoos, lotions etc. Patient states that she did have her hair done about a week ago and thinks it might be related. She took Benadryl prior to arrival with relief.   Past Medical History  Diagnosis Date  . Screening for malignant neoplasm of the cervix   . Routine general medical examination at a health care facility   . Absence of menstruation   . Carbuncle and furuncle of unspecified site   . Headache(784.0)   . Elevated blood pressure reading without diagnosis of hypertension   . Syphilis, unspecified   . 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 Encounter for long-term (current) use of other medications    History reviewed. No pertinent past surgical history. Family History  Problem Relation Age of Onset  . Diabetes Mother   . Coronary artery disease Father    History  Substance Use Topics  . Smoking status: Never Smoker   . Smokeless tobacco: Never Used  . Alcohol Use: No   OB History   Grav Para Term Preterm Abortions TAB SAB Ect Mult Living                 Review of Systems 10 systems reviewed and found to be negative, except as noted in the HPI   Allergies  Review of patient's  allergies indicates no known allergies.  Home Medications   Current Outpatient Rx  Name  Route  Sig  Dispense  Refill  . atenolol (TENORMIN) 25 MG tablet   Oral   Take 1 tablet (25 mg total) by mouth daily. Take 25 mg by mouth daily.   90 tablet   4     [facility] on [address], [place], [place]   . Darunavir Ethanolate (PREZISTA) 800 MG tablet   Oral   Take 1 tablet (800 mg total) by mouth daily with breakfast.   30 tablet   11   . diphenhydrAMINE (BENADRYL) 25 MG tablet   Oral   Take 25 mg by mouth once. For lip swelling         . emtricitabine-tenofovir (TRUVADA) 200-300 MG per tablet   Oral   Take 1 tablet by mouth daily.   30 tablet   11   . Multiple Vitamins-Minerals (ALIVE WOMENS ENERGY) TABS   Oral   Take 1 tablet by mouth daily.         . naproxen sodium (ANAPROX) 220 MG tablet   Oral   Take 440 mg by mouth 2 (two) times daily with a meal.         . ritonavir (NORVIR) 100 MG TABS   Oral   Take 1 tablet (100 mg total) by mouth daily.   30 tablet   11   .  EXPIRED: SUMAtriptan (IMITREX) 50 MG tablet   Oral   Take 1 tablet (50 mg total) by mouth every 2 (two) hours as needed for migraine.   10 tablet   2   . valACYclovir (VALTREX) 500 MG tablet   Oral   Take 1 tablet (500 mg total) by mouth 2 (two) times daily. 1 tablet by mouth twice daily for 5 days as needed   10 tablet   6    BP 138/82  Pulse 83  Temp(Src) 98.3 F (36.8 C)  Resp 18  SpO2 98% Physical Exam  Nursing note and vitals reviewed. Constitutional: She is oriented to person, place, and time. She appears well-developed and well-nourished. No distress.  HENT:  Head: Normocephalic.  Mouth/Throat: Oropharynx is clear and moist.  Mild swelling to bilateral lower lip, no tongue swelling, posterior pharynx is non-edematous. Patient is reclining comfortably, speaking in complete sentences.  Eyes: Conjunctivae and EOM are normal. Pupils are equal, round, and reactive to light.  Neck: Normal  range of motion.  Cardiovascular: Normal rate, regular rhythm and intact distal pulses.   Pulmonary/Chest: Effort normal and breath sounds normal. No stridor. No respiratory distress. She has no wheezes. She has no rales. She exhibits no tenderness.  No stridor, no wheezing  Abdominal: Soft. Bowel sounds are normal.  Musculoskeletal: Normal range of motion.  Neurological: She is alert and oriented to person, place, and time.  Skin:  Small, 1-2 cm erythematous patches to bilateral upper extremities  Psychiatric: She has a normal mood and affect.    ED Course   Procedures (including critical care time)  Labs Reviewed - No data to display No results found. 1. Allergic reaction, initial encounter     MDM   Filed Vitals:   02/09/13 2038  BP: 138/82  Pulse: 83  Temp: 98.3 F (36.8 C)  Resp: 18  SpO2: 98%     Autumn Curry is a 32 y.o. female with mild allergic reaction to unknown allergen. Patient does have a trace amount of lip swelling however there is no airway compromise. Patient will be given Decadron, Pepcid and Benadryl via IV.  Patient states improvement after IV medications. Patient states she has EpiPen at home. I'll refer her to an allergist for further testing  Medications  dexamethasone (DECADRON) injection 10 mg (not administered)  famotidine (PEPCID) IVPB 20 mg (not administered)  diphenhydrAMINE (BENADRYL) injection 25 mg (not administered)    Pt is hemodynamically stable, appropriate for, and amenable to discharge at this time. Pt verbalized understanding and agrees with care plan. All questions answered. Outpatient follow-up and specific return precautions discussed.    New Prescriptions   DIPHENHYDRAMINE (BENADRYL) 25 MG TABLET    Take 2 tablets (50 mg total) by mouth every 4 (four) hours as needed for itching.   FAMOTIDINE (PEPCID) 20 MG TABLET    Take 2 tablets (40 mg total) by mouth at bedtime as needed for heartburn.   HYDROXYZINE (ATARAX/VISTARIL)  25 MG TABLET    Take 1 tablet (25 mg total) by mouth every 6 (six) hours as needed for itching.    Note: Portions of this report may have been transcribed using voice recognition software. Every effort was made to ensure accuracy; however, inadvertent computerized transcription errors may be present    Wynetta Emery, PA-C 02/10/13 0421

## 2013-03-10 ENCOUNTER — Telehealth: Payer: Self-pay | Admitting: Licensed Clinical Social Worker

## 2013-03-10 NOTE — Telephone Encounter (Signed)
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 She will need a note from her provider to not ride on the bus. Please advise.

## 2013-03-15 ENCOUNTER — Encounter: Payer: Self-pay | Admitting: Licensed Clinical Social Worker

## 2013-03-15 NOTE — Telephone Encounter (Signed)
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

## 2013-03-15 NOTE — Telephone Encounter (Signed)
Completed and patient is aware she can come to pick it up.

## 2013-05-05 ENCOUNTER — Other Ambulatory Visit: Payer: BC Managed Care – PPO

## 2013-05-05 DIAGNOSIS — B20 Human immunodeficiency virus [HIV] disease: Secondary | ICD-10-CM

## 2013-05-05 LAB — CBC
HCT: 35.4 % — ABNORMAL LOW (ref 36.0–46.0)
MCV: 87.6 fL (ref 78.0–100.0)
RBC: 4.04 MIL/uL (ref 3.87–5.11)
WBC: 5 10*3/uL (ref 4.0–10.5)

## 2013-05-06 LAB — LIPID PANEL
HDL: 37 mg/dL — ABNORMAL LOW (ref >39)
LDL Cholesterol: 115 mg/dL — ABNORMAL HIGH (ref 0–99)
Total CHOL/HDL Ratio: 4.8 Ratio

## 2013-05-06 LAB — T-HELPER CELL (CD4) - (RCID CLINIC ONLY): CD4 T Cell Abs: 680 /uL (ref 400–2700)

## 2013-05-19 ENCOUNTER — Ambulatory Visit (INDEPENDENT_AMBULATORY_CARE_PROVIDER_SITE_OTHER): Payer: BC Managed Care – PPO | Admitting: Internal Medicine

## 2013-05-19 ENCOUNTER — Encounter: Payer: Self-pay | Admitting: Internal Medicine

## 2013-05-19 VITALS — BP 127/85 | HR 80 | Temp 98.3°F | Ht 62.0 in | Wt 232.5 lb

## 2013-05-19 DIAGNOSIS — B20 Human immunodeficiency virus [HIV] disease: Secondary | ICD-10-CM

## 2013-05-19 DIAGNOSIS — Z23 Encounter for immunization: Secondary | ICD-10-CM

## 2013-05-19 NOTE — Progress Notes (Signed)
Patient ID: Autumn Curry, female   DOB: Jul 24, 1980, 32 y.o.   MRN: [id number]          [facility]  Patient Active Problem List   Diagnosis Date Noted  . Dyslipidemia 05/05/2012    Priority: High  . Hypertension 09/30/2011    Priority: High  . HIV DISEASE 09/03/2006    Priority: High  . Obesity 09/30/2011    Priority: Medium  . Genital herpes 02/16/2012  . 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 BOILS, RECURRENT 08/31/2007  . HEADACHE 08/31/2007  . SYPHILIS NOS 09/11/2006    Patient's Medications  New Prescriptions   No medications on file  Previous Medications   ATENOLOL (TENORMIN) 25 MG TABLET    Take 1 tablet (25 mg total) by mouth daily. Take 25 mg by mouth daily.   DARUNAVIR ETHANOLATE (PREZISTA) 800 MG TABLET    Take 1 tablet (800 mg total) by mouth daily with breakfast.   EMTRICITABINE-TENOFOVIR (TRUVADA) 200-300 MG PER TABLET    Take 1 tablet by mouth daily.   MULTIPLE VITAMINS-MINERALS (ALIVE WOMENS ENERGY) TABS    Take 1 tablet by mouth daily.   NAPROXEN SODIUM (ANAPROX) 220 MG TABLET    Take 440 mg by mouth 2 (two) times daily with a meal.   RITONAVIR (NORVIR) 100 MG TABS    Take 1 tablet (100 mg total) by mouth daily.   SUMATRIPTAN (IMITREX) 50 MG TABLET    Take 1 tablet (50 mg total) by mouth every 2 (two) hours as needed for migraine.   VALACYCLOVIR (VALTREX) 500 MG TABLET    Take 1 tablet (500 mg total) by mouth 2 (two) times daily. 1 tablet by mouth twice daily for 5 days as needed  Modified Medications   No medications on file  Discontinued Medications   DIPHENHYDRAMINE (BENADRYL) 25 MG TABLET    Take 25 mg by mouth once. For lip swelling   DIPHENHYDRAMINE (BENADRYL) 25 MG TABLET    Take 2 tablets (50 mg total) by mouth every 4 (four) hours as needed for itching.   FAMOTIDINE (PEPCID) 20 MG TABLET    Take 2 tablets (40 mg total) by mouth at bedtime as needed for heartburn.   HYDROXYZINE (ATARAX/VISTARIL) 25 MG TABLET    Take 1 tablet (25 mg  total) by mouth every 6 (six) hours as needed for itching.    Subjective: Autumn Curry is in for her routine visit. At the time of her last visit I switched her antiretroviral regimen to Truvada, Prezista and Norvir. She states that she and her husband are no longer trying to have another trial with but she prefers her new regimen. It seems to cause less GI side effects. She does not think she is missed any doses. She does not get any regular exercise. She would like to lose weight.  Review of Systems: Constitutional: negative Eyes: negative Ears, nose, mouth, throat, and face: negative Respiratory: negative Cardiovascular: negative Gastrointestinal: negative Genitourinary:negative  Past Medical History  Diagnosis Date  . Screening for malignant neoplasm of the cervix   . Routine general medical examination at a health care facility   . Absence of menstruation   . Carbuncle and furuncle of unspecified site   . Headache(784.0)   . Elevated blood pressure reading without diagnosis of hypertension   . Syphilis, unspecified   . 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 Encounter for long-term (current) use of other medications     History  Substance  Use Topics  . Smoking status: Never Smoker   . Smokeless tobacco: Never Used  . Alcohol Use: No    Family History  Problem Relation Age of Onset  . Hyperlipidemia Mother   . Coronary artery disease Father   . Diabetes Father   . Heart disease Father   . Hyperlipidemia Father     No Known Allergies  Objective: Temp: 98.3 F (36.8 C) (11/06 1442) Temp src: Oral (11/06 1442) BP: 127/85 mmHg (11/06 1442) Pulse Rate: 80 (11/06 1442)  General: She is in good spirits. Her weight is 232.5 pounds her body mass index is 42.5 Oral: No oropharyngeal lesions Skin: No rash Lungs: Clear Cor: Regular S1 and S2 no murmurs Abdomen: Soft nontender Joints and extremities: Normal with no acute abnormalities Neuro: Alert and oriented with normal  speech and conversation Mood and affect: Bright  Lab Results HIV 1 RNA Quant (copies/mL)  Date Value  05/05/2013 <20   11/02/2012 <20   04/21/2012 <20      CD4 T Cell Abs (/uL)  Date Value  05/05/2013 680   11/02/2012 910   04/21/2012 700      Lab Results  Component Value Date   WBC 5.0 05/05/2013   HGB 11.9* 05/05/2013   HCT 35.4* 05/05/2013   MCV 87.6 05/05/2013   PLT 340 05/05/2013   BMET    Component Value Date/Time   NA 137 11/02/2012 1504   K 4.1 11/02/2012 1504   CL 104 11/02/2012 1504   CO2 26 11/02/2012 1504   GLUCOSE 79 11/02/2012 1504   BUN 13 11/02/2012 1504   CREATININE 0.84 11/02/2012 1504   CREATININE 0.87 08/13/2010 2133   CALCIUM 9.4 11/02/2012 1504   Lab Results  Component Value Date   ALT 18 11/02/2012   AST 16 11/02/2012   ALKPHOS 70 11/02/2012   BILITOT 0.5 11/02/2012   Lab Results  Component Value Date   CHOL 177 05/05/2013   HDL 37* 05/05/2013   LDLCALC 115* 05/05/2013   TRIG 123 05/05/2013   CHOLHDL 4.8 05/05/2013   Assessment: Her HIV infection is under very good control and she is tolerating her new regimen well.  Her blood pressure is at goal.  She is still struggling with morbid obesity and dyslipidemia. She does seem motivated to make some lifestyle changes and promises me that she will try to get to more regular exercise going forward.  Plan: 1. Continue current antiretroviral regimen 2. Continue Tenormin for blood pressure and headache control 3. Influenza vaccination today 4. Lifestyle modification pamphlet provided 5. Followup lab work in 6 months   Cliffton Asters, MD [facility] [phone] pager   [phone] cell 05/19/2013, 3:03 PM

## 2013-10-05 ENCOUNTER — Telehealth: Payer: Self-pay | Admitting: *Deleted

## 2013-10-05 NOTE — Telephone Encounter (Signed)
Received phone call from [facility] at [employer] requesting recent CD4 and Viral Load lab results for patient.  RN requested signed ROI.  Received signed ROI, faxed requested labs to [phone].  Dentist is requesting complete medical record.  Will give this request to medical records. Landis Gandy, RN

## 2013-10-12 ENCOUNTER — Encounter: Payer: Self-pay | Admitting: *Deleted

## 2013-11-15 ENCOUNTER — Other Ambulatory Visit: Payer: Self-pay | Admitting: Licensed Clinical Social Worker

## 2013-11-15 DIAGNOSIS — I10 Essential (primary) hypertension: Secondary | ICD-10-CM

## 2013-11-15 MED ORDER — ATENOLOL 25 MG PO TABS: 25.0000 mg | ORAL_TABLET | Freq: Every day | ORAL | Status: DC

## 2013-11-16 ENCOUNTER — Other Ambulatory Visit: Payer: BC Managed Care – PPO

## 2013-11-16 DIAGNOSIS — Z79899 Other long term (current) drug therapy: Secondary | ICD-10-CM

## 2013-11-16 DIAGNOSIS — Z113 Encounter for screening for infections with a predominantly sexual mode of transmission: Secondary | ICD-10-CM

## 2013-11-16 DIAGNOSIS — B20 Human immunodeficiency virus [HIV] disease: Secondary | ICD-10-CM

## 2013-11-16 LAB — CBC
HCT: 33.3 % — ABNORMAL LOW (ref 36.0–46.0)
HEMOGLOBIN: 11.5 g/dL — AB (ref 12.0–15.0)
MCH: 29.4 pg (ref 26.0–34.0)
MCHC: 34.5 g/dL (ref 30.0–36.0)
MCV: 85.2 fL (ref 78.0–100.0)
PLATELETS: 310 10*3/uL (ref 150–400)
RBC: 3.91 MIL/uL (ref 3.87–5.11)
RDW: 13.7 % (ref 11.5–15.5)
WBC: 5.8 10*3/uL (ref 4.0–10.5)

## 2013-11-17 LAB — T-HELPER CELL (CD4) - (RCID CLINIC ONLY)
CD4 T CELL ABS: 870 /uL (ref 400–2700)
CD4 T CELL HELPER: 31 % — AB (ref 33–55)

## 2013-11-17 LAB — LIPID PANEL
CHOL/HDL RATIO: 4.7 ratio
CHOLESTEROL: 180 mg/dL (ref 0–200)
HDL: 38 mg/dL — ABNORMAL LOW (ref >39)
LDL Cholesterol: 111 mg/dL — ABNORMAL HIGH (ref 0–99)
TRIGLYCERIDES: 153 mg/dL — AB (ref <150)
VLDL: 31 mg/dL (ref 0–40)

## 2013-11-17 LAB — COMPREHENSIVE METABOLIC PANEL
ALBUMIN: 3.8 g/dL (ref 3.5–5.2)
ALT: 23 U/L (ref 0–35)
AST: 18 U/L (ref 0–37)
Alkaline Phosphatase: 80 U/L (ref 39–117)
BILIRUBIN TOTAL: 0.3 mg/dL (ref 0.2–1.2)
BUN: 11 mg/dL (ref 6–23)
CO2: 27 meq/L (ref 19–32)
Calcium: 9.1 mg/dL (ref 8.4–10.5)
Chloride: 102 mEq/L (ref 96–112)
Creat: 0.74 mg/dL (ref 0.50–1.10)
GLUCOSE: 90 mg/dL (ref 70–99)
POTASSIUM: 4 meq/L (ref 3.5–5.3)
SODIUM: 136 meq/L (ref 135–145)
TOTAL PROTEIN: 7 g/dL (ref 6.0–8.3)

## 2013-11-17 LAB — RPR

## 2013-11-18 ENCOUNTER — Telehealth: Payer: Self-pay | Admitting: *Deleted

## 2013-11-18 LAB — HIV-1 RNA QUANT-NO REFLEX-BLD

## 2013-11-18 NOTE — Telephone Encounter (Signed)
[facility] called and advised that there was a flood in the lab and it may have affected the patient sample results. The patient Creatine was 1.51 and has now been adjusted to 0.74. They have faxed new results and I will inform Dr Megan Salon as well.

## 2013-11-23 ENCOUNTER — Other Ambulatory Visit: Payer: Self-pay | Admitting: Internal Medicine

## 2013-11-28 ENCOUNTER — Other Ambulatory Visit: Payer: Self-pay | Admitting: Internal Medicine

## 2013-12-01 ENCOUNTER — Ambulatory Visit: Payer: BC Managed Care – PPO | Admitting: Internal Medicine

## 2013-12-08 ENCOUNTER — Encounter: Payer: Self-pay | Admitting: Internal Medicine

## 2013-12-08 ENCOUNTER — Ambulatory Visit (INDEPENDENT_AMBULATORY_CARE_PROVIDER_SITE_OTHER): Payer: BC Managed Care – PPO | Admitting: Internal Medicine

## 2013-12-08 VITALS — BP 142/102 | HR 84 | Temp 98.1°F | Wt 238.8 lb

## 2013-12-08 DIAGNOSIS — B20 Human immunodeficiency virus [HIV] disease: Secondary | ICD-10-CM

## 2013-12-08 MED ORDER — DARUNAVIR-COBICISTAT 800-150 MG PO TABS: 1.0000 | ORAL_TABLET | Freq: Every day | ORAL | Status: DC

## 2013-12-08 NOTE — Progress Notes (Signed)
Patient ID: Autumn Curry, female   DOB: [birth year], 33 y.o.   MRN: [id number]          Patient Active Problem List   Diagnosis Date Noted  . Dyslipidemia 05/05/2012    Priority: High  . Hypertension 09/30/2011    Priority: High  . HIV DISEASE 09/03/2006    Priority: High  . Obesity 09/30/2011    Priority: Medium  . Genital herpes 02/16/2012  . 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 BOILS, RECURRENT 08/31/2007  . HEADACHE 08/31/2007  . SYPHILIS NOS 09/11/2006    Patient's Medications  New Prescriptions   DARUNAVIR-COBICISTAT (PREZCOBIX) 800-150 MG PER TABLET    Take 1 tablet by mouth daily. Swallow whole. Do NOT crush, break or chew tablets. Take with food.  Previous Medications   ATENOLOL (TENORMIN) 25 MG TABLET    Take 1 tablet (25 mg total) by mouth daily. Take 25 mg by mouth daily.   MULTIPLE VITAMINS-MINERALS (ALIVE WOMENS ENERGY) TABS    Take 1 tablet by mouth daily.   NAPROXEN SODIUM (ANAPROX) 220 MG TABLET    Take 440 mg by mouth 2 (two) times daily with a meal.   SUMATRIPTAN (IMITREX) 50 MG TABLET    Take 1 tablet (50 mg total) by mouth every 2 (two) hours as needed for migraine.   TRUVADA 200-300 MG PER TABLET    TAKE 1 TABLET BY MOUTH    DAILY   VALACYCLOVIR (VALTREX) 500 MG TABLET    TAKE ONE TABLET BY MOUTH TWICE DAILY AS NEEDED FOR 5 DAYS  Modified Medications   No medications on file  Discontinued Medications   NORVIR 100 MG TABS TABLET    TAKE 1 TABLET BY MOUTH    DAILY   PREZISTA 800 MG TABLET    TAKE 1 TABLET BY MOUTH    EVERY MORNING TAKE WITH   BREAKFAST    Subjective: Autumn Curry is in for her routine visit. She's been very busy with work and family activities. She is getting regular exercise about 5 days each week. She has had no  Problems obtaining or taking her medications and does not believe she is missed any doses.  Review of Systems: Constitutional: negative Eyes: negative Ears, nose, mouth, throat, and face: negative Respiratory: negative Cardiovascular:  negative Gastrointestinal: negative Genitourinary:negative  Past Medical History  Diagnosis Date  . Screening for malignant neoplasm of the cervix   . Routine general medical examination at a health care facility   . Absence of menstruation   . Carbuncle and furuncle of unspecified site   . Headache(784.0)   . Elevated blood pressure reading without diagnosis of hypertension   . Syphilis, unspecified   . 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 Encounter for long-term (current) use of other medications   . Hyperlipidemia   . Hypertension     History  Substance Use Topics  . Smoking status: Never Smoker   . Smokeless tobacco: Never Used  . Alcohol Use: No    Family History  Problem Relation Age of Onset  . Hyperlipidemia Mother   . Coronary artery disease Father   . Diabetes Father   . Heart disease Father   . Hyperlipidemia Father     No Known Allergies  Objective: Temp: 98.1 F (36.7 C) (05/28 0843) Temp src: Oral (05/28 0843) BP: 142/102 mmHg (05/28 0843) Pulse Rate: 84 (05/28 0843) Body mass index is 43.66 kg/(m^2).  General: Her weight is up 6 pounds to 238 Oral: No  oropharyngeal lesions Skin: No rash Lungs: Clear Cor: Regular S1 and S2 with no murmurs Abdomen: obese, soft and non tender Joints and extremities: normal Neuro: alert with normal speech and conversation Mood and affect: normal  Lab Results Lab Results  Component Value Date   WBC 5.8 11/16/2013   HGB 11.5* 11/16/2013   HCT 33.3* 11/16/2013   MCV 85.2 11/16/2013   PLT 310 11/16/2013    Lab Results  Component Value Date   CREATININE 0.74 11/16/2013   BUN 11 11/16/2013   NA 136 11/16/2013   K 4.0 11/16/2013   CL 102 11/16/2013   CO2 27 11/16/2013    Lab Results  Component Value Date   ALT 23 11/16/2013   AST 18 11/16/2013   ALKPHOS 80 11/16/2013   BILITOT 0.3 11/16/2013    Lab Results  Component Value Date   CHOL 180 11/16/2013   HDL 38* 11/16/2013   LDLCALC 111* 11/16/2013   TRIG 153* 11/16/2013   CHOLHDL 4.7  11/16/2013    Lab Results HIV 1 RNA Quant (copies/mL)  Date Value  11/16/2013 <20   05/05/2013 <20   11/02/2012 <20      CD4 T Cell Abs (/uL)  Date Value  11/16/2013 870   05/05/2013 680   11/02/2012 910      Assessment: For HIV infection remains under excellent control. I will simplify her regimen to Truvada plus Prezcobix.  She continues to struggle with morbid obesity, hypertension and dyslipidemia. I talked her about the importance of long-term, sustainable lifestyle modification. She is also interested in establishing primary care to help her with these issues.  Plan: 1. Continue Truvada with Prezcobix 2. Help her establish primary care 3. Followup here after lab work in [place] months   Michel Bickers, MD [facility] [place] [phone] pager   [phone] cell 12/08/2013, 8:59 AM

## 2013-12-09 ENCOUNTER — Ambulatory Visit (INDEPENDENT_AMBULATORY_CARE_PROVIDER_SITE_OTHER): Payer: BC Managed Care – PPO | Admitting: *Deleted

## 2013-12-09 DIAGNOSIS — Z124 Encounter for screening for malignant neoplasm of cervix: Secondary | ICD-10-CM

## 2013-12-09 DIAGNOSIS — Z113 Encounter for screening for infections with a predominantly sexual mode of transmission: Secondary | ICD-10-CM

## 2013-12-09 NOTE — Progress Notes (Signed)
  Subjective:     Autumn Curry is a 33 y.o. woman who comes in today for a  pap smear only.  Previous abnormal Pap smears: no. Contraception: condoms  Objective:    LMP 11/28/2013 Pelvic Exam:  Pap smear obtained.   Assessment:    Screening pap smear.   Plan:    Follow up in one year due to HIV status, or as indicated by Pap results.  Pt given educational materials re: HIV and women, self-esteem, BSE, nutrition and diet management, PAP smears and partner safety. Pt given condoms.

## 2013-12-09 NOTE — Patient Instructions (Signed)
Your results will be ready in about a week.  I will mail them to you.  Thank you for coming to the Center for your care.  Valeria Boza,  RN 

## 2013-12-13 ENCOUNTER — Encounter: Payer: Self-pay | Admitting: *Deleted

## 2014-01-11 ENCOUNTER — Other Ambulatory Visit: Payer: Self-pay | Admitting: Licensed Clinical Social Worker

## 2014-01-11 DIAGNOSIS — B20 Human immunodeficiency virus [HIV] disease: Secondary | ICD-10-CM

## 2014-01-11 MED ORDER — EMTRICITABINE-TENOFOVIR DF 200-300 MG PO TABS: ORAL_TABLET | ORAL | Status: DC

## 2014-02-15 ENCOUNTER — Ambulatory Visit: Payer: BC Managed Care – PPO | Admitting: Family Medicine

## 2014-02-21 ENCOUNTER — Telehealth: Payer: Self-pay | Admitting: *Deleted

## 2014-02-21 NOTE — Telephone Encounter (Signed)
[name]  Autumn Curry has been approved for $4000.00 grant.  Effective until 02-21-15.

## 2014-04-21 ENCOUNTER — Ambulatory Visit: Payer: BC Managed Care – PPO | Admitting: Family Medicine

## 2014-05-15 ENCOUNTER — Other Ambulatory Visit: Payer: Self-pay | Admitting: Internal Medicine

## 2014-05-15 DIAGNOSIS — B20 Human immunodeficiency virus [HIV] disease: Secondary | ICD-10-CM

## 2014-06-01 ENCOUNTER — Encounter: Payer: Self-pay | Admitting: Family Medicine

## 2014-06-01 ENCOUNTER — Ambulatory Visit (INDEPENDENT_AMBULATORY_CARE_PROVIDER_SITE_OTHER): Payer: BC Managed Care – PPO | Admitting: Family Medicine

## 2014-06-01 VITALS — BP 120/82 | HR 81 | Temp 98.0°F | Ht 62.5 in | Wt 233.4 lb

## 2014-06-01 DIAGNOSIS — R609 Edema, unspecified: Secondary | ICD-10-CM

## 2014-06-01 DIAGNOSIS — Z3009 Encounter for other general counseling and advice on contraception: Secondary | ICD-10-CM

## 2014-06-01 DIAGNOSIS — G43009 Migraine without aura, not intractable, without status migrainosus: Secondary | ICD-10-CM

## 2014-06-01 DIAGNOSIS — I10 Essential (primary) hypertension: Secondary | ICD-10-CM

## 2014-06-01 MED ORDER — HYDROCHLOROTHIAZIDE 25 MG PO TABS: 25.0000 mg | ORAL_TABLET | Freq: Every day | ORAL | Status: DC

## 2014-06-01 MED ORDER — ELETRIPTAN HYDROBROMIDE 20 MG PO TABS: 20.0000 mg | ORAL_TABLET | ORAL | Status: DC | PRN

## 2014-06-01 NOTE — Patient Instructions (Signed)

## 2014-06-01 NOTE — Progress Notes (Signed)
Pre visit review using our clinic review tool, if applicable. No additional management support is needed unless otherwise documented below in the visit note. 

## 2014-06-01 NOTE — Progress Notes (Signed)
Subjective:    Patient ID: Autumn Curry, female    DOB: 04-14-1981, 33 y.o.   MRN: [id number]  HPI Pt here to establish and c/o migraines that are worsening.  She   Past Medical History  Diagnosis Date  . Screening for malignant neoplasm of the cervix   . Routine general medical examination at a health care facility   . Absence of menstruation   . Carbuncle and furuncle of unspecified site   . Headache(784.0)   . Elevated blood pressure reading without diagnosis of hypertension   . Syphilis, unspecified   . 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 Encounter for long-term (current) use of other medications   . Hyperlipidemia   . Hypertension    History   Social History  . Marital Status: Married    Spouse Name: N/A    Number of Children: N/A  . Years of Education: N/A   Occupational History  . Not on file.   Social History Main Topics  . Smoking status: Never Smoker   . Smokeless tobacco: Never Used  . Alcohol Use: No  . Drug Use: No  . Sexual Activity:    Partners: Male    Birth Control/ Protection: Condom     Comment: given condoms   Other Topics Concern  . Not on file   Social History Narrative   Family History  Problem Relation Age of Onset  . Hyperlipidemia Mother   . Coronary artery disease Father   . Diabetes Father   . Heart disease Father   . Hyperlipidemia Father    Current Outpatient Prescriptions  Medication Sig Dispense Refill  . atenolol (TENORMIN) 25 MG tablet Take 1 tablet (25 mg total) by mouth daily. Take 25 mg by mouth daily. 90 tablet 4  . darunavir-cobicistat (PREZCOBIX) 800-150 MG per tablet Take 1 tablet by mouth daily. Swallow whole. Do NOT crush, break or chew tablets. Take with food. 30 tablet 11  . Multiple Vitamins-Minerals (ALIVE WOMENS ENERGY) TABS Take 1 tablet by mouth daily.    . naproxen sodium (ANAPROX) 220 MG tablet Take 440 mg by mouth 2 (two) times daily with a meal.    . SUMAtriptan (IMITREX) 50 MG tablet Take 1 tablet  (50 mg total) by mouth every 2 (two) hours as needed for migraine. 10 tablet 2  . TRUVADA 200-300 MG per tablet TAKE 1 TABLET BY MOUTH    DAILY 30 tablet 5  . valACYclovir (VALTREX) 500 MG tablet TAKE ONE TABLET BY MOUTH TWICE DAILY AS NEEDED FOR 5 DAYS 10 tablet 3  . eletriptan (RELPAX) 20 MG tablet Take 1 tablet (20 mg total) by mouth as needed for migraine or headache. One tablet by mouth at onset of headache. May repeat in 2 hours if headache persists or recurs. 10 tablet 0  . hydrochlorothiazide (HYDRODIURIL) 25 MG tablet Take 1 tablet (25 mg total) by mouth daily. 30 tablet 11   No current facility-administered medications for this visit.   No Known Allergies   Review of Systems As above    Objective:   Physical Exam BP 120/82 mmHg  Pulse 81  Temp(Src) 98 F (36.7 C) (Oral)  Ht 5' 2.5" (1.588 m)  Wt 233 lb 6.4 oz (105.87 kg)  BMI 41.98 kg/m2  SpO2 98%  LMP 05/23/2014 General appearance: alert, cooperative, appears stated age and no distress Ears: normal TM's and external ear canals both ears Nose: Nares normal. Septum midline. Mucosa normal. No drainage  or sinus tenderness. Throat: lips, mucosa, and tongue normal; teeth and gums normal Neck: no adenopathy, no carotid bruit, no JVD, supple, symmetrical, trachea midline and thyroid not enlarged, symmetric, no tenderness/mass/nodules Lungs: clear to auscultation bilaterally Heart: S1, S2 normal Extremities: extremities normal, atraumatic, no cyanosis or edema        Assessment & Plan:  1. Migraine without aura and without status migrainosus, not intractable   - eletriptan (RELPAX) 20 MG tablet; Take 1 tablet (20 mg total) by mouth as needed for migraine or headache. One tablet by mouth at onset of headache. May repeat in 2 hours if headache persists or recurs.  Dispense: 10 tablet; Refill: 0  2. Edema   - hydrochlorothiazide (HYDRODIURIL) 25 MG tablet; Take 1 tablet (25 mg total) by mouth daily.  Dispense: 30 tablet;  Refill: 11  3. Essential hypertension con't meds stable  4. Family planning Refer to gyn - Ambulatory referral to Obstetrics / Gynecology

## 2014-07-28 ENCOUNTER — Encounter (HOSPITAL_COMMUNITY): Payer: Self-pay

## 2014-08-01 ENCOUNTER — Ambulatory Visit (HOSPITAL_COMMUNITY)
Admission: RE | Admit: 2014-08-01 | Discharge: 2014-08-01 | Disposition: A | Discharge status: Home / Self Care | Payer: BC Managed Care – PPO | Source: Ambulatory Visit | Attending: Obstetrics and Gynecology | Admitting: Obstetrics and Gynecology

## 2014-08-01 DIAGNOSIS — Z79899 Other long term (current) drug therapy: Secondary | ICD-10-CM | POA: Insufficient documentation

## 2014-08-01 DIAGNOSIS — I10 Essential (primary) hypertension: Secondary | ICD-10-CM | POA: Insufficient documentation

## 2014-08-01 DIAGNOSIS — Z3169 Encounter for other general counseling and advice on procreation: Secondary | ICD-10-CM | POA: Diagnosis not present

## 2014-08-01 DIAGNOSIS — Z21 Asymptomatic human immunodeficiency virus [HIV] infection status: Secondary | ICD-10-CM | POA: Diagnosis not present

## 2014-08-01 NOTE — ED Notes (Signed)
Preconception consult with Dr. Lisbeth Renshaw.  Anovulation with +HIV, the use of clomid with HIV meds BP 110/77 Pulse 71 235lbs

## 2014-08-01 NOTE — Consult Note (Signed)
Maternal Fetal Medicine Consultation  Requesting Provider(s): Autumn Posey, MD  Reason for consultation: HIV positive, preconception counseling  HPI: Autumn Curry is a 34 yo G1P0101 seen for preconception counseling due to HIV positive status.  She is currently followed by Dr. Megan Curry (Infectious Disease) and her HIV has been well-controlled for a number of years.  Her viral loads have been undetectable for several years now.  Autumn Curry in interested in having another child but has been anovulatory, and her primary OB/GYN plans to begin Clomid.    Her past OB history is also remarkable for Chronic Hypertension and HSV.  She is otherwise in good health and without complaints today.  OB History: G1- 36 week SVD - induced due to suspected IUGR- 4#11oz infant   PMH:  Past Medical History  Diagnosis Date  . Screening for malignant neoplasm of the cervix   . Routine general medical examination at a health care facility   . Absence of menstruation   . Carbuncle and furuncle of unspecified site   . Headache(784.0)   . Elevated blood pressure reading without diagnosis of hypertension   . Syphilis, unspecified   . 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 Encounter for long-term (current) use of other medications   . Hyperlipidemia   . Hypertension     PSH: No past surgical history on file.   Meds:  Current Outpatient Prescriptions on File Prior to Encounter  Medication Sig Dispense Refill  . atenolol (TENORMIN) 25 MG tablet Take 1 tablet (25 mg total) by mouth daily. Take 25 mg by mouth daily. 90 tablet 4  . darunavir-cobicistat (PREZCOBIX) 800-150 MG per tablet Take 1 tablet by mouth daily. Swallow whole. Do NOT crush, break or chew tablets. Take with food. 30 tablet 11  . eletriptan (RELPAX) 20 MG tablet Take 1 tablet (20 mg total) by mouth as needed for migraine or headache. One tablet by mouth at onset of headache. May repeat in 2 hours if headache persists or recurs. 10 tablet 0  .  hydrochlorothiazide (HYDRODIURIL) 25 MG tablet Take 1 tablet (25 mg total) by mouth daily. 30 tablet 11  . Multiple Vitamins-Minerals (ALIVE WOMENS ENERGY) TABS Take 1 tablet by mouth daily.    . naproxen sodium (ANAPROX) 220 MG tablet Take 440 mg by mouth 2 (two) times daily with a meal.    . SUMAtriptan (IMITREX) 50 MG tablet Take 1 tablet (50 mg total) by mouth every 2 (two) hours as needed for migraine. 10 tablet 2  . TRUVADA 200-300 MG per tablet TAKE 1 TABLET BY MOUTH    DAILY 30 tablet 5  . valACYclovir (VALTREX) 500 MG tablet TAKE ONE TABLET BY MOUTH TWICE DAILY AS NEEDED FOR 5 DAYS 10 tablet 3   No current facility-administered medications on file prior to encounter.   Allergies: No Known Allergies   FH:  Family History  Problem Relation Age of Onset  . Hyperlipidemia Mother   . Coronary artery disease Father   . Diabetes Father   . Heart disease Father   . Hyperlipidemia Father    Soc:  History   Social History  . Marital Status: Married    Spouse Name: N/A    Number of Children: N/A  . Years of Education: N/A   Occupational History  . Not on file.   Social History Main Topics  . Smoking status: Never Smoker   . Smokeless tobacco: Never Used  . Alcohol Use: No  .  Drug Use: No  . Sexual Activity:    Partners: Male    Birth Control/ Protection: Condom     Comment: given condoms   Other Topics Concern  . Not on file   Social History Narrative    Labs:   11/16/2013 - HIV1 RNA <20 11/16/2013 - CD4 T-cell 870  A/P: 1) HIV positive status - the patient's HIV status is currently well controlled on ART with an undetectable viral load.  With ART and an undetectable viral load, the risk of HIV transmission to the newborn is < 1%.  The patient is aware of this risk - and feel that it would be reasonable to begin Clomid therapy.  She will need close follow up with Infectious Disease during her pregnancy.  If not already taking prenatal vitamins, would recommend this for  folic acid supplementation and prevention of ONTD.        2) Chronic Hypertension - would recommend stopping HCTZ prior to conception.  Atenolol has been associated with fetal growth issues.  Labetalol would be a more reasonable choice - would recommend stopping Atenolol and switching to Labetalol 100 mg BID- would titrate upwards as appropriate.  Would also recommend a baseline 24-hr urine protein and preeclampsia labs either now or at the time of the patient's first OB visit.         3) Hx of fetal growth restriction with previous pregnancy - would recommend serial growth scans in the 3rd trimester given this history.  She will also need antenatal testing beginning at 32 weeks due to chronic hypertension.   Thank you for the opportunity to be a part of the care of Autumn Curry. Please contact our office if we can be of further assistance.   I spent approximately 30 minutes with this patient with over 50% of time spent in face-to-face counseling.  Autumn Lobe, MD Maternal Fetal Medicine

## 2014-08-02 ENCOUNTER — Other Ambulatory Visit (HOSPITAL_COMMUNITY): Payer: Self-pay

## 2014-11-09 ENCOUNTER — Other Ambulatory Visit: Payer: Self-pay | Admitting: Internal Medicine

## 2014-11-29 ENCOUNTER — Telehealth: Payer: Self-pay | Admitting: *Deleted

## 2014-11-29 DIAGNOSIS — B20 Human immunodeficiency virus [HIV] disease: Secondary | ICD-10-CM

## 2014-11-29 MED ORDER — EMTRICITABINE-TENOFOVIR DF 200-300 MG PO TABS: ORAL_TABLET | ORAL | Status: DC

## 2014-11-29 NOTE — Telephone Encounter (Signed)
Needing appts and Truvada refill.  Made lab and MD appts.

## 2014-11-30 ENCOUNTER — Other Ambulatory Visit: Payer: BLUE CROSS/BLUE SHIELD

## 2014-11-30 DIAGNOSIS — Z79899 Other long term (current) drug therapy: Secondary | ICD-10-CM

## 2014-11-30 DIAGNOSIS — B20 Human immunodeficiency virus [HIV] disease: Secondary | ICD-10-CM

## 2014-11-30 DIAGNOSIS — Z113 Encounter for screening for infections with a predominantly sexual mode of transmission: Secondary | ICD-10-CM

## 2014-12-01 LAB — RPR

## 2014-12-01 LAB — LIPID PANEL
CHOL/HDL RATIO: 4.4 ratio
Cholesterol: 171 mg/dL (ref 0–200)
HDL: 39 mg/dL — AB (ref >=46)
LDL CALC: 103 mg/dL — AB (ref 0–99)
TRIGLYCERIDES: 143 mg/dL (ref <150)
VLDL: 29 mg/dL (ref 0–40)

## 2014-12-01 LAB — COMPREHENSIVE METABOLIC PANEL
ALT: 17 U/L (ref 0–35)
AST: 17 U/L (ref 0–37)
Albumin: 3.6 g/dL (ref 3.5–5.2)
Alkaline Phosphatase: 55 U/L (ref 39–117)
BILIRUBIN TOTAL: 0.2 mg/dL (ref 0.2–1.2)
BUN: 18 mg/dL (ref 6–23)
CALCIUM: 9.3 mg/dL (ref 8.4–10.5)
CHLORIDE: 101 meq/L (ref 96–112)
CO2: 30 meq/L (ref 19–32)
CREATININE: 0.95 mg/dL (ref 0.50–1.10)
GLUCOSE: 91 mg/dL (ref 70–99)
Potassium: 3.7 mEq/L (ref 3.5–5.3)
Sodium: 138 mEq/L (ref 135–145)
Total Protein: 7 g/dL (ref 6.0–8.3)

## 2014-12-01 LAB — CBC WITH DIFFERENTIAL/PLATELET
BASOS PCT: 0 % (ref 0–1)
Basophils Absolute: 0 10*3/uL (ref 0.0–0.1)
EOS ABS: 0.1 10*3/uL (ref 0.0–0.7)
EOS PCT: 1 % (ref 0–5)
HEMATOCRIT: 35.2 % — AB (ref 36.0–46.0)
HEMOGLOBIN: 12.4 g/dL (ref 12.0–15.0)
LYMPHS ABS: 3.3 10*3/uL (ref 0.7–4.0)
Lymphocytes Relative: 56 % — ABNORMAL HIGH (ref 12–46)
MCH: 31.2 pg (ref 26.0–34.0)
MCHC: 35.2 g/dL (ref 30.0–36.0)
MCV: 88.7 fL (ref 78.0–100.0)
MPV: 9.5 fL (ref 8.6–12.4)
Monocytes Absolute: 0.3 10*3/uL (ref 0.1–1.0)
Monocytes Relative: 5 % (ref 3–12)
Neutro Abs: 2.2 10*3/uL (ref 1.7–7.7)
Neutrophils Relative %: 38 % — ABNORMAL LOW (ref 43–77)
PLATELETS: 303 10*3/uL (ref 150–400)
RBC: 3.97 MIL/uL (ref 3.87–5.11)
RDW: 14 % (ref 11.5–15.5)
WBC: 5.9 10*3/uL (ref 4.0–10.5)

## 2014-12-01 LAB — T-HELPER CELL (CD4) - (RCID CLINIC ONLY)
CD4 T CELL HELPER: 31 % — AB (ref 33–55)
CD4 T Cell Abs: 1030 /uL (ref 400–2700)

## 2014-12-01 NOTE — Addendum Note (Signed)
Addended by: Dolan Amen D on: 12/01/2014 11:23 AM   Modules accepted: Orders

## 2014-12-03 LAB — HIV-1 RNA QUANT-NO REFLEX-BLD: HIV-1 RNA Quant, Log: <1.3 {Log} (ref <1.30)

## 2014-12-19 ENCOUNTER — Ambulatory Visit (INDEPENDENT_AMBULATORY_CARE_PROVIDER_SITE_OTHER): Payer: BLUE CROSS/BLUE SHIELD | Admitting: Internal Medicine

## 2014-12-19 ENCOUNTER — Encounter: Payer: Self-pay | Admitting: Internal Medicine

## 2014-12-19 VITALS — BP 130/85 | HR 84 | Temp 98.1°F | Wt 235.2 lb

## 2014-12-19 DIAGNOSIS — B20 Human immunodeficiency virus [HIV] disease: Secondary | ICD-10-CM

## 2014-12-19 MED ORDER — CLOMIPHENE CITRATE 50 MG PO TABS: 50.0000 mg | ORAL_TABLET | Freq: Every day | ORAL | Status: DC

## 2014-12-19 NOTE — Progress Notes (Signed)
Subjective:    Patient ID: Autumn Curry, female    DOB: Dec 14, 1980, 34 y.o.   MRN: [id number]  HPI  Mrs. Cousar is a 34 y/o female who presents to clinic for routine HIV follow up. Her most recent CD4 count was 1030 and undetectable VL. She continues to take her medications every day without side effects. She states that approximately five days ago she developed a productive cough with green sputum and wheezing. She believes she had a fever on Saturday. However, her cough has gradually improved and her sputum yesterday was clear. She also states that she and her HIV negative partner are trying to conceive. She saw OB four months ago and was put on Clomid to promote ovulation. She and her partner are having unprotected sex for reproduction. Her LMP was 11/23/14.    Outpatient Encounter Prescriptions as of 12/19/2014  Medication Sig  . atenolol (TENORMIN) 25 MG tablet Take 1 tablet (25 mg total) by mouth daily. Take 25 mg by mouth daily.  . clomiPHENE (CLOMID) 50 MG tablet Take 1 tablet (50 mg total) by mouth daily.  . darunavir-cobicistat (PREZCOBIX) 800-150 MG per tablet Take 1 tablet by mouth daily. Swallow whole. Do NOT crush, break or chew tablets. Take with food.  . eletriptan (RELPAX) 20 MG tablet Take 1 tablet (20 mg total) by mouth as needed for migraine or headache. One tablet by mouth at onset of headache. May repeat in 2 hours if headache persists or recurs.  [name] emtricitabine-tenofovir (TRUVADA) 200-300 MG per tablet TAKE 1 TABLET BY MOUTH    DAILY  . hydrochlorothiazide (HYDRODIURIL) 25 MG tablet Take 1 tablet (25 mg total) by mouth daily.  . Multiple Vitamins-Minerals (ALIVE WOMENS ENERGY) TABS Take 1 tablet by mouth daily.  . naproxen sodium (ANAPROX) 220 MG tablet Take 440 mg by mouth 2 (two) times daily with a meal.  . SUMAtriptan (IMITREX) 50 MG tablet Take 1 tablet (50 mg total) by mouth every 2 (two) hours as needed for migraine.  . valACYclovir (VALTREX) 500 MG tablet TAKE ONE  TABLET BY MOUTH TWICE DAILY AS NEEDED FOR 5 DAYS   No facility-administered encounter medications on file as of 12/19/2014.     Review of Systems  Constitutional: Negative for fever, chills, diaphoresis, fatigue and unexpected weight change.  HENT: Negative for congestion, dental problem, ear pain, mouth sores, rhinorrhea, sinus pressure, sore throat and trouble swallowing.   Eyes: Negative for visual disturbance.  Respiratory: Positive for cough. Negative for shortness of breath and wheezing.        Productive cough with clear sputum  Cardiovascular: Negative for chest pain.  Gastrointestinal: Negative for nausea, vomiting, abdominal pain, diarrhea, constipation and abdominal distention.  Genitourinary: Negative for difficulty urinating.       LMP 11/23/14. PAP scheduled by Langley Gauss.  Musculoskeletal: Negative for myalgias and arthralgias.  Skin: Negative for rash.  Neurological: Negative for dizziness, weakness, light-headedness and headaches.  Hematological: Negative for adenopathy.  Psychiatric/Behavioral: Negative for behavioral problems.       Objective:   Physical Exam  Constitutional: She is oriented to person, place, and time. She appears well-developed and well-nourished.  HENT:  Head: Normocephalic and atraumatic.  Mouth/Throat: Oropharynx is clear and moist.  Eyes: Conjunctivae are normal. Pupils are equal, round, and reactive to light.  Neck: Normal range of motion. Neck supple.  Cardiovascular: Normal rate and regular rhythm.   Pulmonary/Chest: Effort normal and breath sounds normal. She has no wheezes.  Abdominal: Soft. Bowel  sounds are normal.  Musculoskeletal: Normal range of motion.  Lymphadenopathy:    She has no cervical adenopathy.  Neurological: She is alert and oriented to person, place, and time.  Skin: Skin is warm and dry.  Psychiatric: She has a normal mood and affect. Her behavior is normal. Judgment and thought content normal.     Lab Results    Component Value Date   HIV1RNAQUANT <20 11/30/2014    Lab Results  Component Value Date   CD4TABS 1030 12/01/2014   CD4TABS 870 11/16/2013   CD4TABS 680 05/05/2013      Assessment & Plan:  Assessment:  Mrs. Dockter's HIV is well controlled on her current regimen. She and her husband are trying to conceive and are being followed by OB. She is taking Clomid to promote ovulation, per pharmacy there is no concern for drug-drug interactions. Discussed small risk of HIV transmission to HIV negative husband and the option of PrEP therapy. Her husband does not have insurance so we will look into getting him assistance if he chooses PrEP.  Her symptoms regarding her URI have been resolving and she states she feels almost back to normal.  Plan: - Continue current regimen as this is generally safe in the perinatal period  - Checked with pharmacist Mihn and Clomid is safe to take with Prezcobix - Call our office if her husband is interested in PrEP, will need to see Maudie Mercury for patient assistance - Follow up in 3 months and call if she becomes pregnant prior to her next appointment - Scheduled for PAP with Langley Gauss

## 2014-12-21 ENCOUNTER — Other Ambulatory Visit: Payer: Self-pay | Admitting: *Deleted

## 2014-12-21 DIAGNOSIS — B20 Human immunodeficiency virus [HIV] disease: Secondary | ICD-10-CM

## 2014-12-21 MED ORDER — DARUNAVIR-COBICISTAT 800-150 MG PO TABS: 1.0000 | ORAL_TABLET | Freq: Every day | ORAL | Status: DC

## 2015-01-05 ENCOUNTER — Ambulatory Visit (INDEPENDENT_AMBULATORY_CARE_PROVIDER_SITE_OTHER): Payer: BLUE CROSS/BLUE SHIELD | Admitting: *Deleted

## 2015-01-05 DIAGNOSIS — Z124 Encounter for screening for malignant neoplasm of cervix: Secondary | ICD-10-CM

## 2015-01-05 DIAGNOSIS — Z113 Encounter for screening for infections with a predominantly sexual mode of transmission: Secondary | ICD-10-CM

## 2015-01-05 NOTE — Patient Instructions (Signed)
Your results will be ready in about a week.  I will mail them to you or send them to you via MyChart.  Thank you for coming to the Center for your care.  Langley Gauss, RN

## 2015-01-05 NOTE — Progress Notes (Addendum)
  Subjective:     Autumn Curry is a 34 y.o. woman who comes in today for a  pap smear only.  Previous abnormal Pap smears: no. Contraception: Plannning on becoming pregnant, husband to start Truvada.  Husband accompanied wife to visit.   Objective:    LMP December 25, 2014 Pelvic Exam: Pap smear obtained.   Assessment:    Screening pap smear.   Plan:    Follow up in one year, or as indicated by Pap results.  Plannning on becoming pregnant, husband to start Truvada.  Husband accompanied pt to the visit.  Pt given educational materials re: HIV and women, self-esteem, BSE, nutrition and diet management, PAP smears and partner safety. Pt given condoms.

## 2015-01-08 LAB — CYTOLOGY - PAP

## 2015-01-12 ENCOUNTER — Encounter: Payer: Self-pay | Admitting: *Deleted

## 2015-01-12 NOTE — Telephone Encounter (Signed)
ERROR

## 2015-02-10 ENCOUNTER — Other Ambulatory Visit: Payer: Self-pay | Admitting: Internal Medicine

## 2015-03-13 ENCOUNTER — Other Ambulatory Visit: Payer: BLUE CROSS/BLUE SHIELD

## 2015-03-13 DIAGNOSIS — B20 Human immunodeficiency virus [HIV] disease: Secondary | ICD-10-CM

## 2015-03-15 LAB — T-HELPER CELL (CD4) - (RCID CLINIC ONLY)
CD4 % Helper T Cell: 34 % (ref 33–55)
CD4 T Cell Abs: 940 /uL (ref 400–2700)

## 2015-03-15 LAB — HIV-1 RNA QUANT-NO REFLEX-BLD
HIV 1 RNA Quant: <20 copies/mL (ref <20)
HIV-1 RNA Quant, Log: <1.3 {Log} (ref <1.30)

## 2015-03-29 ENCOUNTER — Ambulatory Visit (INDEPENDENT_AMBULATORY_CARE_PROVIDER_SITE_OTHER): Payer: BLUE CROSS/BLUE SHIELD | Admitting: Internal Medicine

## 2015-03-29 ENCOUNTER — Encounter: Payer: Self-pay | Admitting: Internal Medicine

## 2015-03-29 DIAGNOSIS — B20 Human immunodeficiency virus [HIV] disease: Secondary | ICD-10-CM

## 2015-03-29 NOTE — Progress Notes (Signed)
Patient ID: NGAN QUALLS, female   DOB: May 03, 1981, 34 y.o.   MRN: [id number]          Patient Active Problem List   Diagnosis Date Noted  . Dyslipidemia 05/05/2012    Priority: High  . Hypertension 09/30/2011    Priority: High  . Human immunodeficiency virus (HIV) disease 09/03/2006    Priority: High  . Obesity 09/30/2011    Priority: Medium  . Genital herpes 02/16/2012  . 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 BOILS, RECURRENT 08/31/2007  . HEADACHE 08/31/2007  . SYPHILIS NOS 09/11/2006    Patient's Medications  New Prescriptions   No medications on file  Previous Medications   ATENOLOL (TENORMIN) 25 MG TABLET    TAKE ONE TABLET BY MOUTH ONCE DAILY   CLOMIPHENE (CLOMID) 50 MG TABLET    Take 1 tablet (50 mg total) by mouth daily.   DARUNAVIR-COBICISTAT (PREZCOBIX) 800-150 MG PER TABLET    Take 1 tablet by mouth daily. Swallow whole. Do NOT crush, break or chew tablets. Take with food.   ELETRIPTAN (RELPAX) 20 MG TABLET    Take 1 tablet (20 mg total) by mouth as needed for migraine or headache. One tablet by mouth at onset of headache. May repeat in 2 hours if headache persists or recurs.   EMTRICITABINE-TENOFOVIR (TRUVADA) 200-300 MG PER TABLET    TAKE 1 TABLET BY MOUTH    DAILY   HYDROCHLOROTHIAZIDE (HYDRODIURIL) 25 MG TABLET    Take 1 tablet (25 mg total) by mouth daily.   MULTIPLE VITAMINS-MINERALS (ALIVE WOMENS ENERGY) TABS    Take 1 tablet by mouth daily.   NAPROXEN SODIUM (ANAPROX) 220 MG TABLET    Take 440 mg by mouth 2 (two) times daily with a meal.   SUMATRIPTAN (IMITREX) 50 MG TABLET    Take 1 tablet (50 mg total) by mouth every 2 (two) hours as needed for migraine.   VALACYCLOVIR (VALTREX) 500 MG TABLET    TAKE ONE TABLET BY MOUTH TWICE DAILY AS NEEDED FOR 5 DAYS  Modified Medications   No medications on file  Discontinued Medications   No medications on file    Subjective: Autumn Curry is in for her routine HIV follow-up visit. She is still trying to become pregnant but so  far has been unsuccessful. She continues to take her Clomid. She has not missed any doses of her Truvada or Prezcobix. She has no significant concerns about her health other than wanting to lose weight. She has trouble being consistent with her exercise.   Review of Systems: Pertinent items are noted in HPI.  Past Medical History  Diagnosis Date  . Screening for malignant neoplasm of the cervix   . Routine general medical examination at a health care facility   . Absence of menstruation   . Carbuncle and furuncle of unspecified site   . Headache(784.0)   . Elevated blood pressure reading without diagnosis of hypertension   . Syphilis, unspecified   . 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 Encounter for long-term (current) use of other medications   . Hyperlipidemia   . Hypertension     Social History  Substance Use Topics  . Smoking status: Never Smoker   . Smokeless tobacco: Never Used  . Alcohol Use: No    Family History  Problem Relation Age of Onset  . Hyperlipidemia Mother   . Coronary artery disease Father   . Diabetes Father   . Heart disease Father   . Hyperlipidemia  Father     No Known Allergies  Objective:  Filed Vitals:   03/29/15 0845  BP: 125/87  Pulse: 85  Temp: 98.8 F (37.1 C)  TempSrc: Oral  Resp: 14  Height: 5\' 2"  (1.575 m)  Weight: 240 lb (108.863 kg)   Body mass index is 43.89 kg/(m^2).  General: She is in good spirits Oral: No oropharyngeal lesions Skin: No rash Lungs: Clear Cor: Regular S1 and S2 with no murmurs  Lab Results Lab Results  Component Value Date   WBC 5.9 11/30/2014   HGB 12.4 11/30/2014   HCT 35.2* 11/30/2014   MCV 88.7 11/30/2014   PLT 303 11/30/2014    Lab Results  Component Value Date   CREATININE 0.95 11/30/2014   BUN 18 11/30/2014   NA 138 11/30/2014   K 3.7 11/30/2014   CL 101 11/30/2014   CO2 30 11/30/2014    Lab Results  Component Value Date   ALT 17 11/30/2014   AST 17 11/30/2014   ALKPHOS 55  11/30/2014   BILITOT 0.2 11/30/2014    Lab Results  Component Value Date   CHOL 171 11/30/2014   HDL 39* 11/30/2014   LDLCALC 103* 11/30/2014   TRIG 143 11/30/2014   CHOLHDL 4.4 11/30/2014    Lab Results HIV 1 RNA QUANT (copies/mL)  Date Value  03/13/2015 <20  11/30/2014 <20  11/16/2013 <20   CD4 T CELL ABS (/uL)  Date Value  03/13/2015 940  12/01/2014 1030  11/16/2013 870     Problem List Items Addressed This Visit      High   Human immunodeficiency virus (HIV) disease    Her HIV infection remains under excellent control. I will continue her current anti-retroviral regimen. We are still trying to see if her husband is eligible for Truvada preexposure prophylaxis. She will return after lab work in 3 months.      Relevant Orders   T-helper cell (CD4)- ([facility] clinic only)   HIV 1 RNA quant-no reflex-bld        Autumn Bickers, MD [facility] for [place] [phone] pager   [phone] cell 03/29/2015, 9:00 AM

## 2015-03-29 NOTE — Assessment & Plan Note (Signed)
Her HIV infection remains under excellent control. I will continue her current anti-retroviral regimen. We are still trying to see if her husband is eligible for Truvada preexposure prophylaxis. She will return after lab work in 3 months.

## 2015-04-11 ENCOUNTER — Other Ambulatory Visit: Payer: Self-pay | Admitting: Internal Medicine

## 2015-06-14 ENCOUNTER — Other Ambulatory Visit: Payer: Self-pay | Admitting: Family Medicine

## 2015-06-14 ENCOUNTER — Other Ambulatory Visit: Payer: BLUE CROSS/BLUE SHIELD

## 2015-06-14 DIAGNOSIS — B20 Human immunodeficiency virus [HIV] disease: Secondary | ICD-10-CM

## 2015-06-15 LAB — T-HELPER CELL (CD4) - (RCID CLINIC ONLY)
CD4 T CELL HELPER: 33 % (ref 33–55)
CD4 T Cell Abs: 1110 /uL (ref 400–2700)

## 2015-06-18 LAB — HIV-1 RNA QUANT-NO REFLEX-BLD

## 2015-06-28 ENCOUNTER — Ambulatory Visit: Payer: BLUE CROSS/BLUE SHIELD | Admitting: Internal Medicine

## 2015-08-07 ENCOUNTER — Telehealth: Payer: Self-pay | Admitting: Family Medicine

## 2015-08-15 NOTE — Telephone Encounter (Signed)
.  mychart

## 2015-08-21 ENCOUNTER — Ambulatory Visit: Payer: BLUE CROSS/BLUE SHIELD | Admitting: Internal Medicine

## 2015-08-28 ENCOUNTER — Encounter: Payer: Self-pay | Admitting: Internal Medicine

## 2015-08-28 ENCOUNTER — Ambulatory Visit (INDEPENDENT_AMBULATORY_CARE_PROVIDER_SITE_OTHER): Payer: BLUE CROSS/BLUE SHIELD | Admitting: Internal Medicine

## 2015-08-28 DIAGNOSIS — B20 Human immunodeficiency virus [HIV] disease: Secondary | ICD-10-CM

## 2015-08-28 DIAGNOSIS — Z23 Encounter for immunization: Secondary | ICD-10-CM

## 2015-08-28 MED ORDER — EMTRICITABINE-TENOFOVIR AF 200-25 MG PO TABS: 1.0000 | ORAL_TABLET | Freq: Every day | ORAL | Status: DC

## 2015-08-28 NOTE — Assessment & Plan Note (Signed)
Her HIV infection remains under excellent control. I will change Truvada to the newer safer version called Descovy and continue it along with Prezcobix. She will follow-up after lab work in 6 months. She will be given co-pay cards for her medications.

## 2015-08-28 NOTE — Progress Notes (Signed)
Patient Active Problem List   Diagnosis Date Noted  . Dyslipidemia 05/05/2012    Priority: High  . Hypertension 09/30/2011    Priority: High  . Human immunodeficiency virus (HIV) disease ([place]) 09/03/2006    Priority: High  . Obesity 09/30/2011    Priority: Medium  . Genital herpes 02/16/2012  . 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 BOILS, RECURRENT 08/31/2007  . HEADACHE 08/31/2007  . SYPHILIS NOS 09/11/2006    Patient's Medications  New Prescriptions   EMTRICITABINE-TENOFOVIR AF (DESCOVY) 200-25 MG TABLET    Take 1 tablet by mouth daily.  Previous Medications   ATENOLOL (TENORMIN) 25 MG TABLET    TAKE ONE TABLET BY MOUTH ONCE DAILY   DARUNAVIR-COBICISTAT (PREZCOBIX) 800-150 MG PER TABLET    Take 1 tablet by mouth daily. Swallow whole. Do NOT crush, break or chew tablets. Take with food.   ELETRIPTAN (RELPAX) 20 MG TABLET    Take 1 tablet (20 mg total) by mouth as needed for migraine or headache. One tablet by mouth at onset of headache. May repeat in 2 hours if headache persists or recurs.   HYDROCHLOROTHIAZIDE (HYDRODIURIL) 25 MG TABLET    TAKE ONE TABLET BY MOUTH ONCE DAILY   MULTIPLE VITAMINS-MINERALS (ALIVE WOMENS ENERGY) TABS    Take 1 tablet by mouth daily.   NAPROXEN SODIUM (ANAPROX) 220 MG TABLET    Take 440 mg by mouth 2 (two) times daily with a meal.   SUMATRIPTAN (IMITREX) 50 MG TABLET    Take 1 tablet (50 mg total) by mouth every 2 (two) hours as needed for migraine.   VALACYCLOVIR (VALTREX) 500 MG TABLET    TAKE ONE TABLET BY MOUTH TWICE DAILY AS NEEDED FOR 5 DAYS  Modified Medications   No medications on file  Discontinued Medications   CLOMIPHENE (CLOMID) 50 MG TABLET    Take 1 tablet (50 mg total) by mouth daily.   OVIDREL 250 MCG/0.5ML INJECTION    Reported on 08/28/2015   TRUVADA 200-300 MG TABLET    TAKE 1 TABLET BY MOUTH ONCE DAILY    Subjective: Autumn Curry is in for her routine HIV follow-up visit. She had some difficulty getting her HIV medications last  fall and was out of them for one week. 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 She is now back at work and has [employer]. Her co-pay is about $130 each month but she can't afford that. She has stopped taking her fertility drugs after not being able to get pregnant last year. She is still trying to exercise more consistently.   Review of Systems: Review of Systems  Constitutional: Negative for fever, chills, weight loss, malaise/fatigue and diaphoresis.  HENT: Negative for sore throat.   Respiratory: Negative for cough, sputum production and shortness of breath.   Cardiovascular: Negative for chest pain.  Gastrointestinal: Negative for nausea, vomiting and diarrhea.  Musculoskeletal: Negative for joint pain.  Skin: Negative for rash.  Psychiatric/Behavioral: Negative for depression.    Past Medical History  Diagnosis Date  . Screening for malignant neoplasm of the cervix   . Routine general medical examination at a health care facility   . Absence of menstruation   . Carbuncle and furuncle of unspecified site   . Headache(784.0)   . Elevated blood pressure reading without diagnosis of hypertension   . Syphilis, unspecified   . HIV infection ([place]) 2008    dx AIDS  .  Encounter for long-term (current) use of other medications   . Hyperlipidemia   . Hypertension     Social History  Substance Use Topics  . Smoking status: Never Smoker   . Smokeless tobacco: Never Used  . Alcohol Use: No    Family History  Problem Relation Age of Onset  . Hyperlipidemia Mother   . Coronary artery disease Father   . Diabetes Father   . Heart disease Father   . Hyperlipidemia Father     No Known Allergies  Objective:  Filed Vitals:   08/28/15 0853  BP: 132/87  Pulse: 82  Temp: 98.4 F (36.9 C)  TempSrc: Oral  Weight: 243 lb (110.224 kg)   Body mass index is 44.43 kg/(m^2).  Physical Exam  Constitutional: She is oriented to person,  place, and time. No distress.  HENT:  Mouth/Throat: No oropharyngeal exudate.  Eyes: Conjunctivae are normal.  Cardiovascular: Normal rate and regular rhythm.   No murmur heard. Pulmonary/Chest: Breath sounds normal.  Abdominal: Soft. There is no tenderness.  Musculoskeletal: Normal range of motion.  Neurological: She is alert and oriented to person, place, and time.  Skin: No rash noted.  Psychiatric: Mood and affect normal.    Lab Results Lab Results  Component Value Date   WBC 5.9 11/30/2014   HGB 12.4 11/30/2014   HCT 35.2* 11/30/2014   MCV 88.7 11/30/2014   PLT 303 11/30/2014    Lab Results  Component Value Date   CREATININE 0.95 11/30/2014   BUN 18 11/30/2014   NA 138 11/30/2014   K 3.7 11/30/2014   CL 101 11/30/2014   CO2 30 11/30/2014    Lab Results  Component Value Date   ALT 17 11/30/2014   AST 17 11/30/2014   ALKPHOS 55 11/30/2014   BILITOT 0.2 11/30/2014    Lab Results  Component Value Date   CHOL 171 11/30/2014   HDL 39* 11/30/2014   LDLCALC 103* 11/30/2014   TRIG 143 11/30/2014   CHOLHDL 4.4 11/30/2014    Lab Results HIV 1 RNA QUANT (copies/mL)  Date Value  06/14/2015 <20  03/13/2015 <20  11/30/2014 <20   CD4 T CELL ABS (/uL)  Date Value  06/14/2015 1110  03/13/2015 940  12/01/2014 1030      Problem List Items Addressed This Visit      High   Human immunodeficiency virus (HIV) disease ([place])    Her HIV infection remains under excellent control. I will change Truvada to the newer safer version called Descovy and continue it along with Prezcobix. She will follow-up after lab work in 6 months. She will be given co-pay cards for her medications.      Relevant Medications   emtricitabine-tenofovir AF (DESCOVY) 200-25 MG tablet   Other Relevant Orders   T-helper cell (CD4)- ([facility] clinic only)   HIV 1 RNA quant-no reflex-bld        Autumn Bickers, MD [facility] for [facility] [phone] pager    [phone] cell 08/28/2015, 9:19 AM

## 2015-09-21 ENCOUNTER — Other Ambulatory Visit: Payer: Self-pay | Admitting: Internal Medicine

## 2015-10-10 ENCOUNTER — Other Ambulatory Visit: Payer: Self-pay | Admitting: *Deleted

## 2015-10-10 DIAGNOSIS — B20 Human immunodeficiency virus [HIV] disease: Secondary | ICD-10-CM

## 2015-10-10 MED ORDER — DARUNAVIR-COBICISTAT 800-150 MG PO TABS: 1.0000 | ORAL_TABLET | Freq: Every day | ORAL | Status: DC

## 2015-10-10 MED ORDER — EMTRICITABINE-TENOFOVIR AF 200-25 MG PO TABS: 1.0000 | ORAL_TABLET | Freq: Every day | ORAL | Status: DC

## 2015-10-10 NOTE — Telephone Encounter (Signed)
Pt no longer on Truvada, now on Descovy, refilled.  Sent to correct mail order pharmacy.

## 2015-11-10 ENCOUNTER — Other Ambulatory Visit: Payer: Self-pay | Admitting: Internal Medicine

## 2016-06-04 ENCOUNTER — Other Ambulatory Visit: Payer: Self-pay | Admitting: Internal Medicine

## 2016-06-04 ENCOUNTER — Telehealth: Payer: Self-pay | Admitting: *Deleted

## 2016-06-04 MED ORDER — VALACYCLOVIR HCL 500 MG PO TABS: 500.0000 mg | ORAL_TABLET | Freq: Two times a day (BID) | ORAL | 5 refills | Status: DC

## 2016-06-04 NOTE — Telephone Encounter (Signed)
Patient called stating she is currently having a vaginal outbreak of herpes and needs an Rx for Valtrex. She has not had an outbreak since 2015. I noticed she did not have a f/u appt and she made one for labs and MD. She also needed a pap appt and I scheduled that. Please advise on Rx for Valtrex. She uses [facility] on [address]. Autumn Curry

## 2016-06-04 NOTE — Telephone Encounter (Signed)
Patient notified and appointments scheduled. Autumn Curry

## 2016-06-04 NOTE — Telephone Encounter (Signed)
I sent a refill request to her pharmacy. Please remind her to schedule a follow-up visit.

## 2016-06-12 ENCOUNTER — Other Ambulatory Visit: Payer: BLUE CROSS/BLUE SHIELD

## 2016-06-12 ENCOUNTER — Other Ambulatory Visit: Payer: Self-pay | Admitting: *Deleted

## 2016-06-12 DIAGNOSIS — B20 Human immunodeficiency virus [HIV] disease: Secondary | ICD-10-CM

## 2016-06-12 LAB — CBC WITH DIFFERENTIAL/PLATELET
Basophils Absolute: 58 cells/uL (ref 0–200)
Basophils Relative: 1 %
EOS PCT: 2 %
Eosinophils Absolute: 116 cells/uL (ref 15–500)
HCT: 35.8 % (ref 35.0–45.0)
Hemoglobin: 11.7 g/dL (ref 11.7–15.5)
LYMPHS ABS: 2784 {cells}/uL (ref 850–3900)
LYMPHS PCT: 48 %
MCH: 29.3 pg (ref 27.0–33.0)
MCHC: 32.7 g/dL (ref 32.0–36.0)
MCV: 89.5 fL (ref 80.0–100.0)
MPV: 9.5 fL (ref 7.5–12.5)
Monocytes Absolute: 348 cells/uL (ref 200–950)
Monocytes Relative: 6 %
NEUTROS PCT: 43 %
Neutro Abs: 2494 cells/uL (ref 1500–7800)
Platelets: 351 10*3/uL (ref 140–400)
RBC: 4 MIL/uL (ref 3.80–5.10)
RDW: 14.1 % (ref 11.0–15.0)
WBC: 5.8 10*3/uL (ref 3.8–10.8)

## 2016-06-12 LAB — COMPLETE METABOLIC PANEL WITH GFR
ALT: 17 U/L (ref 6–29)
AST: 17 U/L (ref 10–30)
Albumin: 3.7 g/dL (ref 3.6–5.1)
Alkaline Phosphatase: 66 U/L (ref 33–115)
BUN: 12 mg/dL (ref 7–25)
CHLORIDE: 102 mmol/L (ref 98–110)
CO2: 28 mmol/L (ref 20–31)
Calcium: 9.3 mg/dL (ref 8.6–10.2)
Creat: 1 mg/dL (ref 0.50–1.10)
GFR, Est African American: 84 mL/min (ref >=60)
GFR, Est Non African American: 73 mL/min (ref >=60)
GLUCOSE: 95 mg/dL (ref 65–99)
POTASSIUM: 4.1 mmol/L (ref 3.5–5.3)
SODIUM: 137 mmol/L (ref 135–146)
Total Bilirubin: 0.3 mg/dL (ref 0.2–1.2)
Total Protein: 7 g/dL (ref 6.1–8.1)

## 2016-06-13 LAB — T-HELPER CELL (CD4) - (RCID CLINIC ONLY)
CD4 % Helper T Cell: 34 % (ref 33–55)
CD4 T Cell Abs: 960 /uL (ref 400–2700)

## 2016-06-13 LAB — URINE CYTOLOGY ANCILLARY ONLY
Chlamydia: NEGATIVE
NEISSERIA GONORRHEA: NEGATIVE

## 2016-06-13 LAB — RPR

## 2016-06-16 LAB — HIV-1 RNA QUANT-NO REFLEX-BLD: HIV 1 RNA Quant: <20 copies/mL (ref <20)

## 2016-06-25 ENCOUNTER — Encounter: Payer: Self-pay | Admitting: Internal Medicine

## 2016-06-25 ENCOUNTER — Ambulatory Visit (INDEPENDENT_AMBULATORY_CARE_PROVIDER_SITE_OTHER): Payer: BLUE CROSS/BLUE SHIELD | Admitting: Internal Medicine

## 2016-06-25 DIAGNOSIS — Z23 Encounter for immunization: Secondary | ICD-10-CM

## 2016-06-25 DIAGNOSIS — IMO0001 Reserved for inherently not codable concepts without codable children: Secondary | ICD-10-CM

## 2016-06-25 DIAGNOSIS — E6609 Other obesity due to excess calories: Secondary | ICD-10-CM

## 2016-06-25 DIAGNOSIS — I10 Essential (primary) hypertension: Secondary | ICD-10-CM | POA: Diagnosis not present

## 2016-06-25 DIAGNOSIS — Z6841 Body Mass Index (BMI) 40.0 and over, adult: Secondary | ICD-10-CM

## 2016-06-25 DIAGNOSIS — B20 Human immunodeficiency virus [HIV] disease: Secondary | ICD-10-CM

## 2016-06-25 NOTE — Assessment & Plan Note (Signed)
Her blood pressure is above goal. She has not seen her primary care physician in over a year. I encouraged her to set a goal of getting her blood pressure under control here just like she has done with her HIV viral load.

## 2016-06-25 NOTE — Assessment & Plan Note (Signed)
She has been gaining about 10 pounds year for the past several years. She admits that that is not a good trajectory to be on. I encouraged her to continue to pursue lifestyle modifications that will help her maintain her current weight then slowly began to lose.

## 2016-06-25 NOTE — Assessment & Plan Note (Signed)
Her infection is under excellent, long-term control. She will continue Descovy and Prezcobix in follow-up in one year.

## 2016-06-25 NOTE — Progress Notes (Signed)
Patient Active Problem List   Diagnosis Date Noted  . Dyslipidemia 05/05/2012    Priority: High  . Hypertension 09/30/2011    Priority: High  . Human immunodeficiency virus (HIV) disease ([place]) 09/03/2006    Priority: High  . Obesity 09/30/2011    Priority: Medium  . Genital herpes 02/16/2012  . 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 BOILS, RECURRENT 08/31/2007  . HEADACHE 08/31/2007  . SYPHILIS NOS 09/11/2006    Patient's Medications  New Prescriptions   No medications on file  Previous Medications   ATENOLOL (TENORMIN) 25 MG TABLET    TAKE ONE TABLET BY MOUTH ONCE DAILY   DARUNAVIR-COBICISTAT (PREZCOBIX) 800-150 MG TABLET    Take 1 tablet by mouth daily. Swallow whole. Do NOT crush, break or chew tablets. Take with food.   ELETRIPTAN (RELPAX) 20 MG TABLET    Take 1 tablet (20 mg total) by mouth as needed for migraine or headache. One tablet by mouth at onset of headache. May repeat in 2 hours if headache persists or recurs.   EMTRICITABINE-TENOFOVIR AF (DESCOVY) 200-25 MG TABLET    Take 1 tablet by mouth daily.   HYDROCHLOROTHIAZIDE (HYDRODIURIL) 25 MG TABLET    TAKE ONE TABLET BY MOUTH ONCE DAILY   MULTIPLE VITAMINS-MINERALS (ALIVE WOMENS ENERGY) TABS    Take 1 tablet by mouth daily.   NAPROXEN SODIUM (ANAPROX) 220 MG TABLET    Take 440 mg by mouth 2 (two) times daily with a meal.   SUMATRIPTAN (IMITREX) 50 MG TABLET    Take 1 tablet (50 mg total) by mouth every 2 (two) hours as needed for migraine.   VALACYCLOVIR (VALTREX) 500 MG TABLET    Take 1 tablet (500 mg total) by mouth 2 (two) times daily.  Modified Medications   No medications on file  Discontinued Medications   No medications on file    Subjective: Autumn Curry is in for her routine HIV follow-up visit. She's had no problems obtaining, taking or tolerating her Descovy and Prezcobix. His usual she never misses doses. She is feeling well and has no current concerns. She states that she did lose a little bit of weight over  the summer but then stopped exercising and eating well and has been gaining weight since that time.   Review of Systems: Review of Systems  Constitutional: Negative for chills, diaphoresis, fever, malaise/fatigue and weight loss.  HENT: Negative for sore throat.   Respiratory: Negative for cough, sputum production and shortness of breath.   Cardiovascular: Negative for chest pain.  Gastrointestinal: Negative for abdominal pain, diarrhea, heartburn, nausea and vomiting.  Genitourinary: Negative for dysuria and frequency.  Musculoskeletal: Negative for joint pain and myalgias.  Skin: Negative for rash.  Neurological: Negative for dizziness and headaches.  Psychiatric/Behavioral: Negative for depression and substance abuse. The patient is not nervous/anxious.     Past Medical History:  Diagnosis Date  . Absence of menstruation   . Carbuncle and furuncle of unspecified site   . Elevated blood pressure reading without diagnosis of hypertension   . Encounter for long-term (current) use of other medications   . Headache(784.0)   . HIV infection ([place]) 2008   dx AIDS  . Hyperlipidemia   . Hypertension   . Routine general medical examination at a health care facility   . Screening for malignant neoplasm of the cervix   . Syphilis, unspecified     Social History  Substance Use Topics  . Smoking status: Never  Smoker  . Smokeless tobacco: Never Used  . Alcohol use No    Family History  Problem Relation Age of Onset  . Hyperlipidemia Mother   . Coronary artery disease Father   . Diabetes Father   . Heart disease Father   . Hyperlipidemia Father     No Known Allergies  Objective:  Vitals:   06/25/16 1544 06/25/16 1554  BP: (!) 155/100 (!) 155/99  Pulse: 84 80  Temp: 98.5 F (36.9 C)   TempSrc: Oral   Weight: 253 lb (114.8 kg)   Height: 5\' 2"  (1.575 m)    Body mass index is 46.27 kg/m.  Physical Exam  Constitutional: She is oriented to person, place, and time.  She  is in good spirits. Her weight is up another 10 pounds since her last visit.  HENT:  Mouth/Throat: No oropharyngeal exudate.  Eyes: Conjunctivae are normal.  Cardiovascular: Normal rate and regular rhythm.   No murmur heard. Pulmonary/Chest: Breath sounds normal.  Abdominal: Soft. She exhibits no mass. There is no tenderness.  Musculoskeletal: Normal range of motion.  Neurological: She is alert and oriented to person, place, and time.  Skin: No rash noted.  Psychiatric: Mood and affect normal.    Lab Results Lab Results  Component Value Date   WBC 5.8 06/12/2016   HGB 11.7 06/12/2016   HCT 35.8 06/12/2016   MCV 89.5 06/12/2016   PLT 351 06/12/2016    Lab Results  Component Value Date   CREATININE 1.00 06/12/2016   BUN 12 06/12/2016   NA 137 06/12/2016   K 4.1 06/12/2016   CL 102 06/12/2016   CO2 28 06/12/2016    Lab Results  Component Value Date   ALT 17 06/12/2016   AST 17 06/12/2016   ALKPHOS 66 06/12/2016   BILITOT 0.3 06/12/2016    Lab Results  Component Value Date   CHOL 171 11/30/2014   HDL 39 (L) 11/30/2014   LDLCALC 103 (H) 11/30/2014   TRIG 143 11/30/2014   CHOLHDL 4.4 11/30/2014   HIV 1 RNA Quant (copies/mL)  Date Value  06/12/2016 <20  06/14/2015 <20  03/13/2015 <20   CD4 T Cell Abs (/uL)  Date Value  06/12/2016 960  06/14/2015 1,110  03/13/2015 940     Problem List Items Addressed This Visit      High   Human immunodeficiency virus (HIV) disease ([place])    Her infection is under excellent, long-term control. She will continue Descovy and Prezcobix in follow-up in one year.      Relevant Orders   CBC   T-helper cell (CD4)- ([facility] clinic only)   Comprehensive metabolic panel   Lipid panel   RPR   HIV 1 RNA quant-no reflex-bld   Hypertension    Her blood pressure is above goal. She has not seen her primary care physician in over a year. I encouraged her to set a goal of getting her blood pressure under control here just like she has  done with her HIV viral load.        Medium   Obesity    She has been gaining about 10 pounds year for the past several years. She admits that that is not a good trajectory to be on. I encouraged her to continue to pursue lifestyle modifications that will help her maintain her current weight then slowly began to lose.       Other Visit Diagnoses    Encounter for immunization  Relevant Orders   Flu Vaccine QUAD 36+ mos IM (Completed)        Michel Bickers, MD [facility] [place] [facility] [phone] pager   [phone] cell 06/25/2016, 4:20 PM

## 2016-07-11 ENCOUNTER — Ambulatory Visit (INDEPENDENT_AMBULATORY_CARE_PROVIDER_SITE_OTHER): Payer: BLUE CROSS/BLUE SHIELD | Admitting: *Deleted

## 2016-07-11 ENCOUNTER — Other Ambulatory Visit (HOSPITAL_COMMUNITY)
Admission: RE | Admit: 2016-07-11 | Discharge: 2016-07-11 | Disposition: A | Discharge status: Home / Self Care | Payer: BLUE CROSS/BLUE SHIELD | Source: Ambulatory Visit | Attending: Internal Medicine | Admitting: Internal Medicine

## 2016-07-11 DIAGNOSIS — Z124 Encounter for screening for malignant neoplasm of cervix: Secondary | ICD-10-CM | POA: Diagnosis not present

## 2016-07-11 DIAGNOSIS — Z01419 Encounter for gynecological examination (general) (routine) without abnormal findings: Secondary | ICD-10-CM | POA: Insufficient documentation

## 2016-07-11 DIAGNOSIS — Z23 Encounter for immunization: Secondary | ICD-10-CM

## 2016-07-11 NOTE — Progress Notes (Signed)
Subjective:     Autumn Curry is a 35 y.o. woman who comes in today for a  pap smear only. Previous abnormal Pap smears: no. Contraception: condoms.   Objective:    There were no vitals taken for this visit. Pelvic Exam: . Pap smear obtained.   Assessment:    Screening pap smear.   Plan:    Follow up in one year, or as indicated by Pap results.  Pt given educational materials re: HIV and women, self-esteem, BSE, nutrition and diet management, PAP smears and partner safety. Patient given condoms.

## 2016-07-11 NOTE — Patient Instructions (Signed)
  Your results will be ready in about a week.  You will be able to look them up on MyChart.  Thank you for being my patient.  Langley Gauss,  RN

## 2016-07-15 LAB — CYTOLOGY - PAP: DIAGNOSIS: NEGATIVE

## 2016-08-12 ENCOUNTER — Other Ambulatory Visit: Payer: Self-pay | Admitting: Internal Medicine

## 2016-08-18 ENCOUNTER — Ambulatory Visit (INDEPENDENT_AMBULATORY_CARE_PROVIDER_SITE_OTHER): Payer: BLUE CROSS/BLUE SHIELD | Admitting: Family Medicine

## 2016-08-18 ENCOUNTER — Encounter: Payer: Self-pay | Admitting: Family Medicine

## 2016-08-18 VITALS — BP 133/87 | HR 74 | Temp 97.7°F | Ht 62.0 in | Wt 249.4 lb

## 2016-08-18 DIAGNOSIS — G44009 Cluster headache syndrome, unspecified, not intractable: Secondary | ICD-10-CM | POA: Diagnosis not present

## 2016-08-18 DIAGNOSIS — I10 Essential (primary) hypertension: Secondary | ICD-10-CM

## 2016-08-18 DIAGNOSIS — Z0000 Encounter for general adult medical examination without abnormal findings: Secondary | ICD-10-CM

## 2016-08-18 MED ORDER — HYDROCHLOROTHIAZIDE 25 MG PO TABS: 25.0000 mg | ORAL_TABLET | Freq: Every day | ORAL | 5 refills | Status: DC

## 2016-08-18 MED ORDER — SUMATRIPTAN SUCCINATE 50 MG PO TABS: 50.0000 mg | ORAL_TABLET | ORAL | 2 refills | Status: DC | PRN

## 2016-08-18 NOTE — Progress Notes (Signed)
Pre visit review using our clinic review tool, if applicable. No additional management support is needed unless otherwise documented below in the visit note. 

## 2016-08-18 NOTE — Progress Notes (Signed)
Patient ID: Autumn Curry, female   DOB: 06-Jun-1981, 36 y.o.   MRN: [id number]   Subjective:    Patient ID: Autumn Curry, female    DOB: 10/18/1980, 36 y.o.   MRN: [id number]  Chief Complaint  Patient presents with  . Annual Exam  . Hypertension    Hypertension  This is a chronic problem. The current episode started more than 1 month ago. The problem has been waxing and waning since onset. The problem is controlled. Pertinent negatives include no chest pain, headaches, malaise/fatigue, palpitations or shortness of breath. Risk factors for coronary artery disease include obesity and dyslipidemia. The current treatment provides mild improvement.    Patient is in today for annual examination. Patient is also following up on hypertension. No additional acute concerns noted.  Past Medical History:  Diagnosis Date  . Absence of menstruation   . Carbuncle and furuncle of unspecified site   . Elevated blood pressure reading without diagnosis of hypertension   . Encounter for long-term (current) use of other medications   . Headache(784.0)   . 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 Hyperlipidemia   . Hypertension   . Routine general medical examination at a health care facility   . Screening for malignant neoplasm of the cervix   . Syphilis, unspecified     No past surgical history on file.  Family History  Problem Relation Age of Onset  . Hyperlipidemia Mother   . Coronary artery disease Father   . Diabetes Father   . Heart disease Father   . Hyperlipidemia Father     Social History   Social History  . Marital status: Married    Spouse name: N/A  . Number of children: 1  . Years of education: N/A   Occupational History  .      head start    Social History Main Topics  . Smoking status: Never Smoker  . Smokeless tobacco: Never Used  . Alcohol use No  . Drug use: No  . Sexual activity: Yes    Partners: Male    Birth control/ protection: Condom     Comment:  given condoms   Other Topics Concern  . Not on file   Social History Narrative  . No narrative on file    Outpatient Medications Prior to Visit  Medication Sig Dispense Refill  . atenolol (TENORMIN) 25 MG tablet TAKE ONE TABLET BY MOUTH ONCE DAILY 90 tablet 2  . darunavir-cobicistat (PREZCOBIX) 800-150 MG tablet Take 1 tablet by mouth daily. Swallow whole. Do NOT crush, break or chew tablets. Take with food. 30 tablet 11  . eletriptan (RELPAX) 20 MG tablet Take 1 tablet (20 mg total) by mouth as needed for migraine or headache. One tablet by mouth at onset of headache. May repeat in 2 hours if headache persists or recurs. 10 tablet 0  . emtricitabine-tenofovir AF (DESCOVY) 200-25 MG tablet Take 1 tablet by mouth daily. 30 tablet 11  . Multiple Vitamins-Minerals (ALIVE WOMENS ENERGY) TABS Take 1 tablet by mouth daily.    . valACYclovir (VALTREX) 500 MG tablet Take 1 tablet (500 mg total) by mouth 2 (two) times daily. 10 tablet 5  . hydrochlorothiazide (HYDRODIURIL) 25 MG tablet TAKE ONE TABLET BY MOUTH ONCE DAILY 30 tablet 0  . naproxen sodium (ANAPROX) 220 MG tablet Take 440 mg by mouth 2 (two) times daily with a meal.    . SUMAtriptan (IMITREX) 50 MG tablet Take 1 tablet (50  mg total) by mouth every 2 (two) hours as needed for migraine. 10 tablet 2   No facility-administered medications prior to visit.     No Known Allergies  Review of Systems  Constitutional: Negative for chills, fever and malaise/fatigue.  HENT: Negative for congestion and hearing loss.   Eyes: Negative for discharge.  Respiratory: Negative for cough, sputum production and shortness of breath.   Cardiovascular: Negative for chest pain, palpitations and leg swelling.  Gastrointestinal: Negative for abdominal pain, blood in stool, constipation, diarrhea, heartburn, nausea and vomiting.  Genitourinary: Negative for dysuria, frequency, hematuria and urgency.  Musculoskeletal: Negative for back pain, falls and  myalgias.  Skin: Negative for rash.  Neurological: Negative for dizziness, sensory change, loss of consciousness, weakness and headaches.  Endo/Heme/Allergies: Negative for environmental allergies. Does not bruise/bleed easily.  Psychiatric/Behavioral: Negative for depression and suicidal ideas. The patient is not nervous/anxious and does not have insomnia.        Objective:    Physical Exam  Constitutional: She is oriented to person, place, and time. She appears well-developed and well-nourished. No distress.  HENT:  Head: Normocephalic and atraumatic.  Right Ear: External ear normal.  Left Ear: External ear normal.  Nose: Nose normal.  Mouth/Throat: Oropharynx is clear and moist.  Eyes: Conjunctivae and EOM are normal. Pupils are equal, round, and reactive to light. Right eye exhibits no discharge. Left eye exhibits no discharge.  Neck: Normal range of motion. Neck supple. No JVD present. No thyromegaly present.  Cardiovascular: Normal rate, regular rhythm, normal heart sounds and intact distal pulses.   No murmur heard. Pulmonary/Chest: Effort normal and breath sounds normal. No respiratory distress. She has no wheezes. She has no rales. She exhibits no tenderness.  Abdominal: Soft. Bowel sounds are normal. She exhibits no distension and no mass. There is no tenderness. There is no rebound and no guarding.  Genitourinary: Vagina normal and uterus normal.  Musculoskeletal: Normal range of motion. She exhibits no edema or tenderness.  Lymphadenopathy:    She has no cervical adenopathy.  Neurological: She is alert and oriented to person, place, and time. She has normal reflexes. No cranial nerve deficit.  Skin: Skin is warm and dry. No rash noted. She is not diaphoretic. No erythema.  Psychiatric: She has a normal mood and affect. Her behavior is normal. Judgment and thought content normal.  Nursing note and vitals reviewed.   BP 133/87 (BP Location: Left Arm, Patient Position:  Sitting, Cuff Size: Large)   Pulse 74   Temp 97.7 F (36.5 C) (Oral)   Ht 5\' 2"  (1.575 m)   Wt 249 lb 6.4 oz (113.1 kg)   LMP 08/07/2016 (Approximate)   SpO2 99% Comment: RA  BMI 45.62 kg/m  Wt Readings from Last 3 Encounters:  08/18/16 249 lb 6.4 oz (113.1 kg)  06/25/16 253 lb (114.8 kg)  08/28/15 243 lb (110.2 kg)     Lab Results  Component Value Date   WBC 5.8 06/12/2016   HGB 11.7 06/12/2016   HCT 35.8 06/12/2016   PLT 351 06/12/2016   GLUCOSE 95 06/12/2016   CHOL 171 11/30/2014   TRIG 143 11/30/2014   HDL 39 (L) 11/30/2014   LDLCALC 103 (H) 11/30/2014   ALT 17 06/12/2016   AST 17 06/12/2016   NA 137 06/12/2016   K 4.1 06/12/2016   CL 102 06/12/2016   CREATININE 1.00 06/12/2016   BUN 12 06/12/2016   CO2 28 06/12/2016    No results found for:  TSH Lab Results  Component Value Date   WBC 5.8 06/12/2016   HGB 11.7 06/12/2016   HCT 35.8 06/12/2016   MCV 89.5 06/12/2016   PLT 351 06/12/2016   Lab Results  Component Value Date   NA 137 06/12/2016   K 4.1 06/12/2016   CO2 28 06/12/2016   GLUCOSE 95 06/12/2016   BUN 12 06/12/2016   CREATININE 1.00 06/12/2016   BILITOT 0.3 06/12/2016   ALKPHOS 66 06/12/2016   AST 17 06/12/2016   ALT 17 06/12/2016   PROT 7.0 06/12/2016   ALBUMIN 3.7 06/12/2016   CALCIUM 9.3 06/12/2016   Lab Results  Component Value Date   CHOL 171 11/30/2014   Lab Results  Component Value Date   HDL 39 (L) 11/30/2014   Lab Results  Component Value Date   LDLCALC 103 (H) 11/30/2014   Lab Results  Component Value Date   TRIG 143 11/30/2014   Lab Results  Component Value Date   CHOLHDL 4.4 11/30/2014   No results found for: HGBA1C     Assessment & Plan:   Problem List Items Addressed This Visit      Unprioritized   Hypertension    Pt has been off the hctz for a while-- it ran out con't atenolol Re check 2-3 weeks       Relevant Medications   hydrochlorothiazide (HYDRODIURIL) 25 MG tablet   Other Relevant Orders     Lipid panel   Comprehensive metabolic panel   CBC   TSH   POCT Urinalysis Dipstick (Automated)   Preventative health care - Primary    ghm utd Check labs See AVS      Relevant Orders   Lipid panel   Comprehensive metabolic panel   CBC   TSH   POCT Urinalysis Dipstick (Automated)    Other Visit Diagnoses    Cluster headache, not intractable, unspecified chronicity pattern       Relevant Medications   SUMAtriptan (IMITREX) 50 MG tablet      I have discontinued Ms. Lesnick's naproxen sodium. I am also having her maintain her ALIVE WOMENS ENERGY, eletriptan, emtricitabine-tenofovir AF, darunavir-cobicistat, valACYclovir, atenolol, hydrochlorothiazide, and SUMAtriptan.  Meds ordered this encounter  Medications  . DISCONTD: hydrochlorothiazide (HYDRODIURIL) 25 MG tablet    Sig: Take 1 tablet (25 mg total) by mouth daily.    Dispense:  30 tablet    Refill:  5    Appointment needed for additional refills @ DR.Lowne  . DISCONTD: SUMAtriptan (IMITREX) 50 MG tablet    Sig: Take 1 tablet (50 mg total) by mouth every 2 (two) hours as needed for migraine.    Dispense:  10 tablet    Refill:  2  . hydrochlorothiazide (HYDRODIURIL) 25 MG tablet    Sig: Take 1 tablet (25 mg total) by mouth daily.    Dispense:  30 tablet    Refill:  5    Appointment needed for additional refills @ DR.Lowne  . SUMAtriptan (IMITREX) 50 MG tablet    Sig: Take 1 tablet (50 mg total) by mouth every 2 (two) hours as needed for migraine.    Dispense:  10 tablet    Refill:  2    CMA served as scribe during this visit. History, Physical and Plan performed by medical provider. Documentation and orders reviewed and attested to.  Ann Held, DO

## 2016-08-18 NOTE — Patient Instructions (Signed)
Preventive Care 18-39 Years, Female Preventive care refers to lifestyle choices and visits with your health care provider that can promote health and wellness. What does preventive care include?  A yearly physical exam. This is also called an annual well check.  Dental exams once or twice a year.  Routine eye exams. Ask your health care provider how often you should have your eyes checked.  Personal lifestyle choices, including:  Daily care of your teeth and gums.  Regular physical activity.  Eating a healthy diet.  Avoiding tobacco and drug use.  Limiting alcohol use.  Practicing safe sex.  Taking vitamin and mineral supplements as recommended by your health care provider. What happens during an annual well check? The services and screenings done by your health care provider during your annual well check will depend on your age, overall health, lifestyle risk factors, and family history of disease. Counseling  Your health care provider may ask you questions about your:  Alcohol use.  Tobacco use.  Drug use.  Emotional well-being.  Home and relationship well-being.  Sexual activity.  Eating habits.  Work and work environment.  Method of birth control.  Menstrual cycle.  Pregnancy history. Screening  You may have the following tests or measurements:  Height, weight, and BMI.  Diabetes screening. This is done by checking your blood sugar (glucose) after you have not eaten for a while (fasting).  Blood pressure.  Lipid and cholesterol levels. These may be checked every 5 years starting at age 20.  Skin check.  Hepatitis C blood test.  Hepatitis B blood test.  Sexually transmitted disease (STD) testing.  BRCA-related cancer screening. This may be done if you have a family history of breast, ovarian, tubal, or peritoneal cancers.  Pelvic exam and Pap test. This may be done every 3 years starting at age 21. Starting at age 30, this may be done every 5  years if you have a Pap test in combination with an HPV test. Discuss your test results, treatment options, and if necessary, the need for more tests with your health care provider. Vaccines  Your health care provider may recommend certain vaccines, such as:  Influenza vaccine. This is recommended every year.  Tetanus, diphtheria, and acellular pertussis (Tdap, Td) vaccine. You may need a Td booster every 10 years.  Varicella vaccine. You may need this if you have not been vaccinated.  HPV vaccine. If you are 26 or younger, you may need three doses over 6 months.  Measles, mumps, and rubella (MMR) vaccine. You may need at least one dose of MMR. You may also need a second dose.  Pneumococcal 13-valent conjugate (PCV13) vaccine. You may need this if you have certain conditions and were not previously vaccinated.  Pneumococcal polysaccharide (PPSV23) vaccine. You may need one or two doses if you smoke cigarettes or if you have certain conditions.  Meningococcal vaccine. One dose is recommended if you are age 19-21 years and a first-year college student living in a residence hall, or if you have one of several medical conditions. You may also need additional booster doses.  Hepatitis A vaccine. You may need this if you have certain conditions or if you travel or work in places where you may be exposed to hepatitis A.  Hepatitis B vaccine. You may need this if you have certain conditions or if you travel or work in places where you may be exposed to hepatitis B.  Haemophilus influenzae type b (Hib) vaccine. You may need this   if you have certain risk factors. Talk to your health care provider about which screenings and vaccines you need and how often you need them. This information is not intended to replace advice given to you by your health care provider. Make sure you discuss any questions you have with your health care provider. Document Released: 08/26/2001 Document Revised: 03/19/2016  Document Reviewed: 05/01/2015 Elsevier Interactive Patient Education  2017 [employer].

## 2016-08-18 NOTE — Assessment & Plan Note (Signed)
Pt has been off the hctz for a while-- it ran out con't atenolol Re check 2-3 weeks

## 2016-08-18 NOTE — Assessment & Plan Note (Signed)
ghm utd Check labs See AVS 

## 2016-08-19 LAB — CBC
HCT: 37 % (ref 36.0–46.0)
Hemoglobin: 12.2 g/dL (ref 12.0–15.0)
MCHC: 32.9 g/dL (ref 30.0–36.0)
MCV: 91 fl (ref 78.0–100.0)
PLATELETS: 323 10*3/uL (ref 150.0–400.0)
RBC: 4.07 Mil/uL (ref 3.87–5.11)
RDW: 13.5 % (ref 11.5–15.5)
WBC: 6.6 10*3/uL (ref 4.0–10.5)

## 2016-08-19 LAB — COMPREHENSIVE METABOLIC PANEL
ALBUMIN: 3.9 g/dL (ref 3.5–5.2)
ALK PHOS: 72 U/L (ref 39–117)
ALT: 18 U/L (ref 0–35)
AST: 19 U/L (ref 0–37)
BILIRUBIN TOTAL: 0.3 mg/dL (ref 0.2–1.2)
BUN: 11 mg/dL (ref 6–23)
CO2: 30 mEq/L (ref 19–32)
Calcium: 9.3 mg/dL (ref 8.4–10.5)
Chloride: 102 mEq/L (ref 96–112)
Creatinine, Ser: 0.77 mg/dL (ref 0.40–1.20)
GFR: 108.84 mL/min (ref >60.00)
GLUCOSE: 82 mg/dL (ref 70–99)
Potassium: 3.9 mEq/L (ref 3.5–5.1)
Sodium: 136 mEq/L (ref 135–145)
TOTAL PROTEIN: 7.7 g/dL (ref 6.0–8.3)

## 2016-08-19 LAB — LIPID PANEL
CHOLESTEROL: 196 mg/dL (ref 0–200)
HDL: 40.9 mg/dL (ref >39.00)
LDL Cholesterol: 134 mg/dL — ABNORMAL HIGH (ref 0–99)
NONHDL: 155.1
TRIGLYCERIDES: 106 mg/dL (ref 0.0–149.0)
Total CHOL/HDL Ratio: 5
VLDL: 21.2 mg/dL (ref 0.0–40.0)

## 2016-08-20 LAB — TSH: TSH: 1.96 u[IU]/mL (ref 0.35–4.50)

## 2016-09-02 ENCOUNTER — Ambulatory Visit (INDEPENDENT_AMBULATORY_CARE_PROVIDER_SITE_OTHER): Payer: BLUE CROSS/BLUE SHIELD | Admitting: Family Medicine

## 2016-09-02 ENCOUNTER — Encounter: Payer: Self-pay | Admitting: Family Medicine

## 2016-09-02 VITALS — BP 126/88 | HR 75 | Temp 98.3°F | Resp 16 | Ht 62.0 in | Wt 252.4 lb

## 2016-09-02 DIAGNOSIS — I10 Essential (primary) hypertension: Secondary | ICD-10-CM

## 2016-09-02 NOTE — Patient Instructions (Signed)
Hypertension Hypertension, commonly called high blood pressure, is when the force of blood pumping through your arteries is too strong. Your arteries are the blood vessels that carry blood from your heart throughout your body. A blood pressure reading consists of a higher number over a lower number, such as 110/72. The higher number (systolic) is the pressure inside your arteries when your heart pumps. The lower number (diastolic) is the pressure inside your arteries when your heart relaxes. Ideally you want your blood pressure below 120/80. Hypertension forces your heart to work harder to pump blood. Your arteries may become narrow or stiff. Having untreated or uncontrolled hypertension can cause heart attack, stroke, kidney disease, and other problems. What increases the risk? Some risk factors for high blood pressure are controllable. Others are not. Risk factors you cannot control include:  Race. You may be at higher risk if you are African American.  Age. Risk increases with age.  Gender. Men are at higher risk than women before age 45 years. After age 65, women are at higher risk than men. Risk factors you can control include:  Not getting enough exercise or physical activity.  Being overweight.  Getting too much fat, sugar, calories, or salt in your diet.  Drinking too much alcohol. What are the signs or symptoms? Hypertension does not usually cause signs or symptoms. Extremely high blood pressure (hypertensive crisis) may cause headache, anxiety, shortness of breath, and nosebleed. How is this diagnosed? To check if you have hypertension, your health care provider will measure your blood pressure while you are seated, with your arm held at the level of your heart. It should be measured at least twice using the same arm. Certain conditions can cause a difference in blood pressure between your right and left arms. A blood pressure reading that is higher than normal on one occasion does  not mean that you need treatment. If it is not clear whether you have high blood pressure, you may be asked to return on a different day to have your blood pressure checked again. Or, you may be asked to monitor your blood pressure at home for 1 or more weeks. How is this treated? Treating high blood pressure includes making lifestyle changes and possibly taking medicine. Living a healthy lifestyle can help lower high blood pressure. You may need to change some of your habits. Lifestyle changes may include:  Following the DASH diet. This diet is high in fruits, vegetables, and whole grains. It is low in salt, red meat, and added sugars.  Keep your sodium intake below 2,300 mg per day.  Getting at least 30-45 minutes of aerobic exercise at least 4 times per week.  Losing weight if necessary.  Not smoking.  Limiting alcoholic beverages.  Learning ways to reduce stress. Your health care provider may prescribe medicine if lifestyle changes are not enough to get your blood pressure under control, and if one of the following is true:  You are 18-59 years of age and your systolic blood pressure is above 140.  You are 60 years of age or older, and your systolic blood pressure is above 150.  Your diastolic blood pressure is above 90.  You have diabetes, and your systolic blood pressure is over 140 or your diastolic blood pressure is over 90.  You have kidney disease and your blood pressure is above 140/90.  You have heart disease and your blood pressure is above 140/90. Your personal target blood pressure may vary depending on your medical   conditions, your age, and other factors. Follow these instructions at home:  Have your blood pressure rechecked as directed by your health care provider.  Take medicines only as directed by your health care provider. Follow the directions carefully. Blood pressure medicines must be taken as prescribed. The medicine does not work as well when you skip  doses. Skipping doses also puts you at risk for problems.  Do not smoke.  Monitor your blood pressure at home as directed by your health care provider. Contact a health care provider if:  You think you are having a reaction to medicines taken.  You have recurrent headaches or feel dizzy.  You have swelling in your ankles.  You have trouble with your vision. Get help right away if:  You develop a severe headache or confusion.  You have unusual weakness, numbness, or feel faint.  You have severe chest or abdominal pain.  You vomit repeatedly.  You have trouble breathing. This information is not intended to replace advice given to you by your health care provider. Make sure you discuss any questions you have with your health care provider. Document Released: 06/30/2005 Document Revised: 12/06/2015 Document Reviewed: 04/22/2013 Elsevier Interactive Patient Education  2017 Elsevier Inc.  

## 2016-09-02 NOTE — Progress Notes (Signed)
Pre visit review using our clinic review tool, if applicable. No additional management support is needed unless otherwise documented below in the visit note. 

## 2016-09-02 NOTE — Progress Notes (Signed)
Patient ID: Autumn Curry, female   DOB: [birth year], 36 y.o.   MRN: [id number]  I acted as a Education administrator for Dr. Carollee Herter.  Guerry Bruin, CMA     Subjective:    Patient ID: Autumn Curry, female    DOB: 03-23-1981, 36 y.o.   MRN: [id number]  Chief Complaint  Patient presents with  . Hypertension    Hypertension  This is a chronic problem. The current episode started more than 1 year ago. Pertinent negatives include no chest pain, headaches, malaise/fatigue, palpitations or shortness of breath. Past treatments include beta blockers and diuretics. There are no compliance problems.     Patient is in today for follow up blood pressure  Past Medical History:  Diagnosis Date  . Absence of menstruation   . Carbuncle and furuncle of unspecified site   . Elevated blood pressure reading without diagnosis of hypertension   . Encounter for long-term (current) use of other medications   . Headache(784.0)   . HIV infection ([place]) 2008   dx AIDS  . Hyperlipidemia   . Hypertension   . Routine general medical examination at a health care facility   . Screening for malignant neoplasm of the cervix   . Syphilis, unspecified     No past surgical history on file.  Family History  Problem Relation Age of Onset  . Hyperlipidemia Mother   . Coronary artery disease Father   . Diabetes Father   . Heart disease Father   . Hyperlipidemia Father     Social History   Social History  . Marital status: Married    Spouse name: N/A  . Number of children: 1  . Years of education: N/A   Occupational History  .      head start    Social History Main Topics  . Smoking status: Never Smoker  . Smokeless tobacco: Never Used  . Alcohol use No  . Drug use: No  . Sexual activity: Yes    Partners: Male    Birth control/ protection: Condom     Comment: given condoms   Other Topics Concern  . Not on file   Social History Narrative  . No narrative on file    Outpatient Medications Prior to  Visit  Medication Sig Dispense Refill  . darunavir-cobicistat (PREZCOBIX) 800-150 MG tablet Take 1 tablet by mouth daily. Swallow whole. Do NOT crush, break or chew tablets. Take with food. 30 tablet 11  . eletriptan (RELPAX) 20 MG tablet Take 1 tablet (20 mg total) by mouth as needed for migraine or headache. One tablet by mouth at onset of headache. May repeat in 2 hours if headache persists or recurs. 10 tablet 0  . emtricitabine-tenofovir AF (DESCOVY) 200-25 MG tablet Take 1 tablet by mouth daily. 30 tablet 11  . Multiple Vitamins-Minerals (ALIVE WOMENS ENERGY) TABS Take 1 tablet by mouth daily.    . SUMAtriptan (IMITREX) 50 MG tablet Take 1 tablet (50 mg total) by mouth every 2 (two) hours as needed for migraine. 10 tablet 2  . valACYclovir (VALTREX) 500 MG tablet Take 1 tablet (500 mg total) by mouth 2 (two) times daily. 10 tablet 5  . atenolol (TENORMIN) 25 MG tablet TAKE ONE TABLET BY MOUTH ONCE DAILY 90 tablet 2  . hydrochlorothiazide (HYDRODIURIL) 25 MG tablet Take 1 tablet (25 mg total) by mouth daily. 30 tablet 5   No facility-administered medications prior to visit.     No Known Allergies  Review of Systems  Constitutional:  Negative for chills, fever and malaise/fatigue.  HENT: Negative for congestion and hearing loss.   Eyes: Negative for discharge.  Respiratory: Negative for cough, sputum production and shortness of breath.   Cardiovascular: Negative for chest pain, palpitations and leg swelling.  Gastrointestinal: Negative for abdominal pain, blood in stool, constipation, diarrhea, heartburn, nausea and vomiting.  Genitourinary: Negative for dysuria, frequency, hematuria and urgency.  Musculoskeletal: Negative for back pain, falls and myalgias.  Skin: Negative for rash.  Neurological: Negative for dizziness, sensory change, loss of consciousness, weakness and headaches.  Endo/Heme/Allergies: Negative for environmental allergies. Does not bruise/bleed easily.    Psychiatric/Behavioral: Negative for depression and suicidal ideas. The patient is not nervous/anxious and does not have insomnia.        Objective:    Physical Exam  Constitutional: She is oriented to person, place, and time. She appears well-developed and well-nourished.  HENT:  Head: Normocephalic and atraumatic.  Eyes: Conjunctivae and EOM are normal.  Neck: Normal range of motion. Neck supple. No JVD present. Carotid bruit is not present. No thyromegaly present.  Cardiovascular: Normal rate, regular rhythm and normal heart sounds.   No murmur heard. Pulmonary/Chest: Effort normal and breath sounds normal. No respiratory distress. She has no wheezes. She has no rales. She exhibits no tenderness.  Musculoskeletal: She exhibits no edema.  Neurological: She is alert and oriented to person, place, and time.  Psychiatric: She has a normal mood and affect.    BP 126/88 (BP Location: Left Arm, Cuff Size: Large)   Pulse 75   Temp 98.3 F (36.8 C) (Oral)   Resp 16   Ht 5\' 2"  (1.575 m)   Wt 252 lb 6.4 oz (114.5 kg)   LMP 08/01/2016   SpO2 98%   BMI 46.16 kg/m  Wt Readings from Last 3 Encounters:  09/02/16 252 lb 6.4 oz (114.5 kg)  08/18/16 249 lb 6.4 oz (113.1 kg)  06/25/16 253 lb (114.8 kg)     Lab Results  Component Value Date   WBC 6.6 08/18/2016   HGB 12.2 08/18/2016   HCT 37.0 08/18/2016   PLT 323.0 08/18/2016   GLUCOSE 79 09/02/2016   CHOL 196 08/18/2016   TRIG 106.0 08/18/2016   HDL 40.90 08/18/2016   LDLCALC 134 (H) 08/18/2016   ALT 18 08/18/2016   AST 19 08/18/2016   NA 135 09/02/2016   K 3.5 09/02/2016   CL 100 09/02/2016   CREATININE 0.92 09/02/2016   BUN 16 09/02/2016   CO2 28 09/02/2016   TSH 1.96 08/18/2016    Lab Results  Component Value Date   TSH 1.96 08/18/2016   Lab Results  Component Value Date   WBC 6.6 08/18/2016   HGB 12.2 08/18/2016   HCT 37.0 08/18/2016   MCV 91.0 08/18/2016   PLT 323.0 08/18/2016   Lab Results  Component  Value Date   NA 135 09/02/2016   K 3.5 09/02/2016   CO2 28 09/02/2016   GLUCOSE 79 09/02/2016   BUN 16 09/02/2016   CREATININE 0.92 09/02/2016   BILITOT 0.3 08/18/2016   ALKPHOS 72 08/18/2016   AST 19 08/18/2016   ALT 18 08/18/2016   PROT 7.7 08/18/2016   ALBUMIN 3.9 08/18/2016   CALCIUM 9.5 09/02/2016   GFR 88.61 09/02/2016   Lab Results  Component Value Date   CHOL 196 08/18/2016   Lab Results  Component Value Date   HDL 40.90 08/18/2016   Lab Results  Component Value Date   LDLCALC 134 (H)  08/18/2016   Lab Results  Component Value Date   TRIG 106.0 08/18/2016   Lab Results  Component Value Date   CHOLHDL 5 08/18/2016   No results found for: HGBA1C     Assessment & Plan:   Problem List Items Addressed This Visit      Unprioritized   Hypertension - Primary   Relevant Medications   atenolol (TENORMIN) 25 MG tablet   hydrochlorothiazide (HYDRODIURIL) 25 MG tablet   Other Relevant Orders   Basic Metabolic Panel (BMET) (Completed)      I have changed Ms. Manger's atenolol. I am also having her maintain her ALIVE WOMENS ENERGY, eletriptan, emtricitabine-tenofovir AF, darunavir-cobicistat, valACYclovir, SUMAtriptan, and hydrochlorothiazide.  Meds ordered this encounter  Medications  . atenolol (TENORMIN) 25 MG tablet    Sig: Take 1 tablet (25 mg total) by mouth daily.    Dispense:  90 tablet    Refill:  2  . hydrochlorothiazide (HYDRODIURIL) 25 MG tablet    Sig: Take 1 tablet (25 mg total) by mouth daily.    Dispense:  30 tablet    Refill:  5    Appointment needed for additional refills @ DR.Lowne    CMA served as Education administrator during this visit. History, Physical and Plan performed by medical provider. Documentation and orders reviewed and attested to.  Ann Held, DO

## 2016-09-03 LAB — BASIC METABOLIC PANEL
BUN: 16 mg/dL (ref 6–23)
CHLORIDE: 100 meq/L (ref 96–112)
CO2: 28 meq/L (ref 19–32)
Calcium: 9.5 mg/dL (ref 8.4–10.5)
Creatinine, Ser: 0.92 mg/dL (ref 0.40–1.20)
GFR: 88.61 mL/min (ref >60.00)
Glucose, Bld: 79 mg/dL (ref 70–99)
Potassium: 3.5 mEq/L (ref 3.5–5.1)
SODIUM: 135 meq/L (ref 135–145)

## 2016-09-03 MED ORDER — ATENOLOL 25 MG PO TABS: 25.0000 mg | ORAL_TABLET | Freq: Every day | ORAL | 2 refills | Status: DC

## 2016-09-03 MED ORDER — HYDROCHLOROTHIAZIDE 25 MG PO TABS: 25.0000 mg | ORAL_TABLET | Freq: Every day | ORAL | 5 refills | Status: DC

## 2016-09-08 ENCOUNTER — Other Ambulatory Visit: Payer: Self-pay | Admitting: Internal Medicine

## 2016-09-08 DIAGNOSIS — B20 Human immunodeficiency virus [HIV] disease: Secondary | ICD-10-CM

## 2016-09-29 ENCOUNTER — Other Ambulatory Visit: Payer: Self-pay | Admitting: Internal Medicine

## 2016-09-29 DIAGNOSIS — B20 Human immunodeficiency virus [HIV] disease: Secondary | ICD-10-CM

## 2016-09-29 MED ORDER — DARUNAVIR-COBICISTAT 800-150 MG PO TABS: 1.0000 | ORAL_TABLET | Freq: Every day | ORAL | 5 refills | Status: DC

## 2016-10-21 ENCOUNTER — Other Ambulatory Visit: Payer: Self-pay | Admitting: Internal Medicine

## 2016-10-21 DIAGNOSIS — B20 Human immunodeficiency virus [HIV] disease: Secondary | ICD-10-CM

## 2016-12-29 ENCOUNTER — Ambulatory Visit (INDEPENDENT_AMBULATORY_CARE_PROVIDER_SITE_OTHER): Payer: BLUE CROSS/BLUE SHIELD | Admitting: Family Medicine

## 2016-12-29 ENCOUNTER — Encounter: Payer: Self-pay | Admitting: Family Medicine

## 2016-12-29 VITALS — BP 116/80 | HR 85 | Temp 98.1°F | Resp 16 | Ht 62.0 in | Wt 249.6 lb

## 2016-12-29 DIAGNOSIS — R05 Cough: Secondary | ICD-10-CM

## 2016-12-29 DIAGNOSIS — N926 Irregular menstruation, unspecified: Secondary | ICD-10-CM

## 2016-12-29 DIAGNOSIS — J324 Chronic pansinusitis: Secondary | ICD-10-CM

## 2016-12-29 DIAGNOSIS — J029 Acute pharyngitis, unspecified: Secondary | ICD-10-CM | POA: Diagnosis not present

## 2016-12-29 DIAGNOSIS — R059 Cough, unspecified: Secondary | ICD-10-CM

## 2016-12-29 LAB — POCT RAPID STREP A (OFFICE): Rapid Strep A Screen: NEGATIVE

## 2016-12-29 MED ORDER — AMOXICILLIN-POT CLAVULANATE 875-125 MG PO TABS: 1.0000 | ORAL_TABLET | Freq: Two times a day (BID) | ORAL | 0 refills | Status: DC

## 2016-12-29 MED ORDER — LEVOCETIRIZINE DIHYDROCHLORIDE 5 MG PO TABS: 5.0000 mg | ORAL_TABLET | Freq: Every evening | ORAL | 5 refills | Status: DC

## 2016-12-29 MED ORDER — PROMETHAZINE-DM 6.25-15 MG/5ML PO SYRP: 5.0000 mL | ORAL_SOLUTION | Freq: Four times a day (QID) | ORAL | 0 refills | Status: DC | PRN

## 2016-12-29 NOTE — Progress Notes (Signed)
Patient ID: Autumn Curry, female   DOB: 03-01-1981, 36 y.o.   MRN: [id number]     Subjective:  I acted as a Education administrator for Dr. Carollee Herter.  Autumn Curry, [place]   Patient ID: Autumn Curry, female    DOB: [birth year], 36 y.o.   MRN: [id number]  Chief Complaint  Patient presents with  . Otalgia  . chest congestion    Otalgia   There is pain in the right ear. This is a new problem. Episode onset: friday. Associated symptoms include coughing and a sore throat. Pertinent negatives include no ear discharge, headaches, hearing loss, rash, rhinorrhea or vomiting. Associated symptoms comments: Chest congestion, dry cough, green sputum . Treatments tried: throat spray, dayquil. The treatment provided mild relief.    Patient is in today for ear pain, chest congestion, and green mucus.   Patient Care Team: Carollee Herter, Alferd Apa, DO as PCP - General (Family Medicine) Michel Bickers, MD as PCP - Infectious Diseases (Infectious Diseases) Molli Posey, MD as Consulting Physician (Obstetrics and Gynecology)   Past Medical History:  Diagnosis Date  . Absence of menstruation   . Carbuncle and furuncle of unspecified site   . Elevated blood pressure reading without diagnosis of hypertension   . Encounter for long-term (current) use of other medications   . Headache(784.0)   . HIV infection ([place]) 2008   dx AIDS  . Hyperlipidemia   . Hypertension   . Routine general medical examination at a health care facility   . Screening for malignant neoplasm of the cervix   . Syphilis, unspecified     No past surgical history on file.  Family History  Problem Relation Age of Onset  . Hyperlipidemia Mother   . Coronary artery disease Father   . Diabetes Father   . Heart disease Father   . Hyperlipidemia Father     Social History   Social History  . Marital status: Married    Spouse name: N/A  . Number of children: 1  . Years of education: N/A   Occupational History  .      head start     Social History Main Topics  . Smoking status: Never Smoker  . Smokeless tobacco: Never Used  . Alcohol use No  . Drug use: No  . Sexual activity: Yes    Partners: Male    Birth control/ protection: Condom     Comment: given condoms   Other Topics Concern  . Not on file   Social History Narrative  . No narrative on file    Outpatient Medications Prior to Visit  Medication Sig Dispense Refill  . atenolol (TENORMIN) 25 MG tablet Take 1 tablet (25 mg total) by mouth daily. 90 tablet 2  . darunavir-cobicistat (PREZCOBIX) 800-150 MG tablet Take 1 tablet by mouth daily. Swallow whole. Do NOT crush, break or chew tablets. Take with food. 30 tablet 5  . DESCOVY 200-25 MG tablet TAKE 1 TABLET BY MOUTH DAILY 30 tablet 6  . eletriptan (RELPAX) 20 MG tablet Take 1 tablet (20 mg total) by mouth as needed for migraine or headache. One tablet by mouth at onset of headache. May repeat in 2 hours if headache persists or recurs. 10 tablet 0  . hydrochlorothiazide (HYDRODIURIL) 25 MG tablet Take 1 tablet (25 mg total) by mouth daily. 30 tablet 5  . Multiple Vitamins-Minerals (ALIVE WOMENS ENERGY) TABS Take 1 tablet by mouth daily.    [name] PREZCOBIX 800-150 MG tablet TAKE 1 TABLET BY  MOUTH DAILY WITH FOOD 30 tablet 5  . SUMAtriptan (IMITREX) 50 MG tablet Take 1 tablet (50 mg total) by mouth every 2 (two) hours as needed for migraine. 10 tablet 2  . valACYclovir (VALTREX) 500 MG tablet Take 1 tablet (500 mg total) by mouth 2 (two) times daily. 10 tablet 5  . DESCOVY 200-25 MG tablet TAKE 1 TABLET BY MOUTH DAILY 30 tablet 5   No facility-administered medications prior to visit.     No Known Allergies  Review of Systems  Constitutional: Negative for fever and malaise/fatigue.  HENT: Positive for ear pain and sore throat. Negative for congestion, ear discharge, hearing loss and rhinorrhea.   Eyes: Negative for blurred vision.  Respiratory: Positive for cough. Negative for shortness of breath.    Cardiovascular: Negative for chest pain, palpitations and leg swelling.  Gastrointestinal: Negative for vomiting.  Musculoskeletal: Negative for back pain.  Skin: Negative for rash.  Neurological: Negative for loss of consciousness and headaches.       Objective:    Physical Exam  Constitutional: She is oriented to person, place, and time. She appears well-developed and well-nourished.  HENT:  Right Ear: External ear normal.  Left Ear: External ear normal.  + PND + errythema  Eyes: Conjunctivae are normal. Right eye exhibits no discharge. Left eye exhibits no discharge.  Cardiovascular: Normal rate, regular rhythm and normal heart sounds.   No murmur heard. Pulmonary/Chest: Effort normal. No respiratory distress. She has decreased breath sounds in the left lower field. She has no wheezes. She has rhonchi. She has rales. She exhibits no tenderness.  Musculoskeletal: She exhibits no edema.  Lymphadenopathy:    She has cervical adenopathy.  Neurological: She is alert and oriented to person, place, and time.  Nursing note and vitals reviewed.   BP 116/80 (BP Location: Left Arm, Cuff Size: Large)   Pulse 85   Temp 98.1 F (36.7 C) (Oral)   Resp 16   Ht 5\' 2"  (1.575 m)   Wt 249 lb 9.6 oz (113.2 kg)   LMP 11/23/2016   SpO2 98%   BMI 45.65 kg/m  Wt Readings from Last 3 Encounters:  12/29/16 249 lb 9.6 oz (113.2 kg)  09/02/16 252 lb 6.4 oz (114.5 kg)  08/18/16 249 lb 6.4 oz (113.1 kg)   BP Readings from Last 3 Encounters:  12/29/16 116/80  09/02/16 126/88  08/18/16 133/87     Immunization History  Administered Date(s) Administered  . Hepatitis B 09/11/2006, 12/23/2006, 07/14/2007  . Influenza Split 10/01/2010, 05/26/2011, 05/05/2012  . Influenza Whole 07/14/2007, 04/25/2008, 09/24/2009  . Influenza,inj,Quad PF,36+ Mos 05/19/2013, 08/28/2015, 06/25/2016  . Pneumococcal Polysaccharide-23 07/04/2004, 10/01/2010  . Tdap 07/11/2016    Health Maintenance  Topic Date  Due  . INFLUENZA VACCINE  02/11/2017  . PAP SMEAR  07/11/2017  . TETANUS/TDAP  07/11/2026  . HIV Screening  Completed    Lab Results  Component Value Date   WBC 6.6 08/18/2016   HGB 12.2 08/18/2016   HCT 37.0 08/18/2016   PLT 323.0 08/18/2016   GLUCOSE 79 09/02/2016   CHOL 196 08/18/2016   TRIG 106.0 08/18/2016   HDL 40.90 08/18/2016   LDLCALC 134 (H) 08/18/2016   ALT 18 08/18/2016   AST 19 08/18/2016   NA 135 09/02/2016   K 3.5 09/02/2016   CL 100 09/02/2016   CREATININE 0.92 09/02/2016   BUN 16 09/02/2016   CO2 28 09/02/2016   TSH 1.96 08/18/2016    Lab Results  Component  Value Date   TSH 1.96 08/18/2016   Lab Results  Component Value Date   WBC 6.6 08/18/2016   HGB 12.2 08/18/2016   HCT 37.0 08/18/2016   MCV 91.0 08/18/2016   PLT 323.0 08/18/2016   Lab Results  Component Value Date   NA 135 09/02/2016   K 3.5 09/02/2016   CO2 28 09/02/2016   GLUCOSE 79 09/02/2016   BUN 16 09/02/2016   CREATININE 0.92 09/02/2016   BILITOT 0.3 08/18/2016   ALKPHOS 72 08/18/2016   AST 19 08/18/2016   ALT 18 08/18/2016   PROT 7.7 08/18/2016   ALBUMIN 3.9 08/18/2016   CALCIUM 9.5 09/02/2016   GFR 88.61 09/02/2016   Lab Results  Component Value Date   CHOL 196 08/18/2016   Lab Results  Component Value Date   HDL 40.90 08/18/2016   Lab Results  Component Value Date   LDLCALC 134 (H) 08/18/2016   Lab Results  Component Value Date   TRIG 106.0 08/18/2016   Lab Results  Component Value Date   CHOLHDL 5 08/18/2016   No results found for: HGBA1C       Assessment & Plan:   Problem List Items Addressed This Visit    None    Visit Diagnoses    Sore throat    -  Primary   Relevant Medications   amoxicillin-clavulanate (AUGMENTIN) 875-125 MG tablet   Other Relevant Orders   POCT rapid strep A (Completed)   Culture, Group A Strep   Cough       Relevant Medications   promethazine-dextromethorphan (PROMETHAZINE-DM) 6.25-15 MG/5ML syrup    amoxicillin-clavulanate (AUGMENTIN) 875-125 MG tablet   Chronic pansinusitis       Relevant Medications   promethazine-dextromethorphan (PROMETHAZINE-DM) 6.25-15 MG/5ML syrup   amoxicillin-clavulanate (AUGMENTIN) 875-125 MG tablet   levocetirizine (XYZAL) 5 MG tablet   Irregular periods       Relevant Orders   Ambulatory referral to Obstetrics / Gynecology   hCG, serum, qualitative      I am having Ms. Connors start on promethazine-dextromethorphan, amoxicillin-clavulanate, and levocetirizine. I am also having her maintain her ALIVE WOMENS ENERGY, eletriptan, valACYclovir, SUMAtriptan, atenolol, hydrochlorothiazide, DESCOVY, darunavir-cobicistat, and PREZCOBIX.  Meds ordered this encounter  Medications  . promethazine-dextromethorphan (PROMETHAZINE-DM) 6.25-15 MG/5ML syrup    Sig: Take 5 mLs by mouth 4 (four) times daily as needed.    Dispense:  118 mL    Refill:  0  . amoxicillin-clavulanate (AUGMENTIN) 875-125 MG tablet    Sig: Take 1 tablet by mouth 2 (two) times daily.    Dispense:  20 tablet    Refill:  0  . levocetirizine (XYZAL) 5 MG tablet    Sig: Take 1 tablet (5 mg total) by mouth every evening.    Dispense:  30 tablet    Refill:  5    CMA served as scribe during this visit. History, Physical and Plan performed by medical provider. Documentation and orders reviewed and attested to.  Ann Held, DO

## 2016-12-29 NOTE — Patient Instructions (Signed)

## 2016-12-30 LAB — HCG, SERUM, QUALITATIVE: PREG SERUM: NEGATIVE

## 2016-12-31 LAB — CULTURE, GROUP A STREP

## 2017-02-18 ENCOUNTER — Encounter: Payer: BLUE CROSS/BLUE SHIELD | Admitting: Obstetrics & Gynecology

## 2017-04-10 ENCOUNTER — Other Ambulatory Visit: Payer: Self-pay | Admitting: Infectious Diseases

## 2017-04-10 DIAGNOSIS — B20 Human immunodeficiency virus [HIV] disease: Secondary | ICD-10-CM

## 2017-04-20 ENCOUNTER — Telehealth: Payer: Self-pay | Admitting: *Deleted

## 2017-04-20 DIAGNOSIS — B20 Human immunodeficiency virus [HIV] disease: Secondary | ICD-10-CM

## 2017-04-20 MED ORDER — EMTRICITABINE-TENOFOVIR AF 200-25 MG PO TABS: ORAL_TABLET | ORAL | 5 refills | Status: DC

## 2017-04-20 MED ORDER — DARUNAVIR-COBICISTAT 800-150 MG PO TABS: 1.0000 | ORAL_TABLET | Freq: Every day | ORAL | 5 refills | Status: DC

## 2017-04-20 NOTE — Telephone Encounter (Signed)
Requesting refill.  Patient needs lab and MD appointments.

## 2017-05-25 ENCOUNTER — Ambulatory Visit (INDEPENDENT_AMBULATORY_CARE_PROVIDER_SITE_OTHER): Payer: BLUE CROSS/BLUE SHIELD | Admitting: Obstetrics & Gynecology

## 2017-05-25 ENCOUNTER — Encounter: Payer: Self-pay | Admitting: Obstetrics & Gynecology

## 2017-05-25 ENCOUNTER — Ambulatory Visit (HOSPITAL_BASED_OUTPATIENT_CLINIC_OR_DEPARTMENT_OTHER)
Admission: RE | Admit: 2017-05-25 | Discharge: 2017-05-25 | Disposition: A | Discharge status: Home / Self Care | Payer: BLUE CROSS/BLUE SHIELD | Source: Ambulatory Visit | Attending: Obstetrics & Gynecology | Admitting: Obstetrics & Gynecology

## 2017-05-25 ENCOUNTER — Ambulatory Visit (HOSPITAL_BASED_OUTPATIENT_CLINIC_OR_DEPARTMENT_OTHER): Admission: RE | Admit: 2017-05-25 | Payer: BLUE CROSS/BLUE SHIELD | Source: Ambulatory Visit

## 2017-05-25 VITALS — BP 124/80 | HR 71 | Ht 66.0 in | Wt 256.0 lb

## 2017-05-25 DIAGNOSIS — N898 Other specified noninflammatory disorders of vagina: Secondary | ICD-10-CM

## 2017-05-25 DIAGNOSIS — D252 Subserosal leiomyoma of uterus: Secondary | ICD-10-CM | POA: Diagnosis not present

## 2017-05-25 DIAGNOSIS — N939 Abnormal uterine and vaginal bleeding, unspecified: Secondary | ICD-10-CM | POA: Insufficient documentation

## 2017-05-25 DIAGNOSIS — N970 Female infertility associated with anovulation: Secondary | ICD-10-CM

## 2017-05-25 NOTE — Patient Instructions (Signed)
Polycystic Ovarian Syndrome °Polycystic ovarian syndrome (PCOS) is a common hormonal disorder among women of reproductive age. In most women with PCOS, many small fluid-filled sacs (cysts) grow on the ovaries, and the cysts are not part of a normal menstrual cycle. PCOS can cause problems with your menstrual periods and make it difficult to get pregnant. It can also cause an increased risk of miscarriage with pregnancy. If it is not treated, PCOS can lead to serious health problems, such as diabetes and heart disease. °What are the causes? °The cause of PCOS is not known, but it may be the result of a combination of certain factors, such as: °· Irregular menstrual cycle. °· High levels of certain hormones (androgens). °· Problems with the hormone that helps to control blood sugar (insulin resistance). °· Certain genes. ° °What increases the risk? °This condition is more likely to develop in women who have a family history of PCOS. °What are the signs or symptoms? °Symptoms of PCOS may include: °· Multiple ovarian cysts. °· Infrequent periods or no periods. °· Periods that are too frequent or too heavy. °· Unpredictable periods. °· Inability to get pregnant (infertility) because of not ovulating. °· Increased growth of hair on the face, chest, stomach, back, thumbs, thighs, or toes. °· Acne or oily skin. Acne may develop during adulthood, and it may not respond to treatment. °· Pelvic pain. °· Weight gain or obesity. °· Patches of thickened and dark brown or black skin on the neck, arms, breasts, or thighs (acanthosis nigricans). °· Excess hair growth on the face, chest, abdomen, or upper thighs (hirsutism). ° °How is this diagnosed? °This condition is diagnosed based on: °· Your medical history. °· A physical exam, including a pelvic exam. Your health care provider may look for areas of increased hair growth on your skin. °· Tests, such as: °? Ultrasound. This may be used to examine the ovaries and the lining of the  uterus (endometrium) for cysts. °? Blood tests. These may be used to check levels of sugar (glucose), female hormone (testosterone), and female hormones (estrogen and progesterone) in your blood. ° °How is this treated? °There is no cure for PCOS, but treatment can help to manage symptoms and prevent more health problems from developing. Treatment varies depending on: °· Your symptoms. °· Whether you want to have a baby or whether you need birth control (contraception). ° °Treatment may include nutrition and lifestyle changes along with: °· Progesterone hormone to start a menstrual period. °· Birth control pills to help you have regular menstrual periods. °· Medicines to make you ovulate, if you want to get pregnant. °· Medicine to reduce excessive hair growth. °· Surgery, in severe cases. This may involve making small holes in one or both of your ovaries. This decreases the amount of testosterone that your body produces. ° °Follow these instructions at home: °· Take over-the-counter and prescription medicines only as told by your health care provider. °· Follow a healthy meal plan. This can help you reduce the effects of PCOS. °? Eat a healthy diet that includes lean proteins, complex carbohydrates, fresh fruits and vegetables, low-fat dairy products, and healthy fats. Make sure to eat enough fiber. °· If you are overweight, lose weight as told by your health care provider. °? Losing 10% of your body weight may improve symptoms. °? Your health care provider can determine how much weight loss is best for you and can help you lose weight safely. °· Keep all follow-up visits as told by   your health care provider. This is important. °Contact a health care provider if: °· Your symptoms do not get better with medicine. °· You develop new symptoms. °This information is not intended to replace advice given to you by your health care provider. Make sure you discuss any questions you have with your health care  provider. °Document Released: 10/24/2004 Document Revised: 02/26/2016 Document Reviewed: 12/16/2015 °Elsevier Interactive Patient Education © 2018 Elsevier Inc. ° °

## 2017-05-25 NOTE — Progress Notes (Signed)
Subjective:     Autumn Curry is a 36 y.o. female here for a routine exam.  Current complaints: Pt c/o irreg cycles. Cycles are 30-45 days. They have always been 21 days.  The cycles changed in Mar 2018.  Pt is HIV + x 8 years.  Pt is sexually active. Married. Husband HIV neg.  Pt uses condoms for contraception. Pt has been trying to conceive was on Clomid in 2016. She was told that she was not ovulating. Pt has one child with a different partner. He has no children.     Cycles variable 2-3 day usually heavy. Otherwise light. Cycle length 7 days. Sometimes clotting. Pt reports pain with cycles mainly on the right side but, this has occurred since menarche at age 68 years.   Pt reports that in the years she joined a gym and lost 10-15 pounds. She gained 20+ pounds.    Gynecologic History Patient's last menstrual period was 05/17/2017. Contraception: none Last Pap: 06/2016. Results were: normal Last mammogram: n/a   Obstetric History OB History  Gravida Para Term Preterm AB Living  1 1       1   SAB TAB Ectopic Multiple Live Births               # Outcome Date GA Lbr Len/2nd Weight Sex Delivery Anes PTL Lv  1 Para              The following portions of the patient's history were reviewed and updated as appropriate: allergies, current medications, past family history, past medical history, past social history, past surgical history and problem list.  Review of Systems Pertinent items are noted in HPI.    Objective:  BP 124/80   Pulse 71   Ht 5\' 6"  (1.676 m)   Wt 256 lb (116.1 kg)   LMP 05/17/2017   BMI 41.32 kg/m    CONSTITUTIONAL: Well-developed, well-nourished female in no acute distress.  HENT:  Normocephalic, atraumatic EYES: Conjunctivae and EOM are normal. No scleral icterus.  NECK: Normal range of motion; black risge on skin of neck consistent with acanthosis nigricans SKIN: Skin is warm and dry. No rash noted. Not diaphoretic.No pallor. [place]: Alert and oriented  to person, place, and time. Normal coordination.  GU: EGBUS: no lesions Vagina: no blood in vault Cervix: no lesion; no mucopurulent d/c Uterus: small, mobile Adnexa: no masses; non tender    Assessment:   AUB- suspect due to anovulatory cycles.  I suspect this is due to PCOS. I reviewed PCOS and the comorbidities assoc with it with the pt.    Pt desires conception- spouse has not done a semen analysis     Plan:  Need records from prior GYN ofc Labs: TSH, HgbA1c, Testosterone, FSH, LH, DHEAS Pelvic US F/u in 3-4 weeks Semen analysis of spouse.    Total face-to-face time with patient was 30 min.  Greater than 50% was spent in counseling and coordination of care with the patient.    Brande Uncapher L. Harraway-Smith, M.D., [place] June

## 2017-05-25 NOTE — Progress Notes (Signed)
Pt states that her periods are 45 days apart and this has been going on for a few months. When she does have her period it lasts for 7 days and isn't real heavy.

## 2017-05-26 ENCOUNTER — Encounter: Payer: Self-pay | Admitting: Obstetrics & Gynecology

## 2017-05-26 LAB — CERVICOVAGINAL ANCILLARY ONLY
Bacterial vaginitis: NEGATIVE
Candida vaginitis: POSITIVE — AB

## 2017-05-29 LAB — DHEA-SULFATE: DHEA-SO4: 85.4 ug/dL (ref 57.3–279.2)

## 2017-05-29 LAB — LUTEINIZING HORMONE: LH: 1.5 m[IU]/mL

## 2017-05-29 LAB — FOLLICLE STIMULATING HORMONE: FSH: 4.1 m[IU]/mL

## 2017-05-29 LAB — TESTT+TESTF+SHBG
SEX HORMONE BINDING: 35 nmol/L (ref 24.6–122.0)
Testosterone, Free: 1.2 pg/mL (ref 0.0–4.2)
Testosterone, total: 10 ng/dL (ref 10.0–55.0)

## 2017-05-29 LAB — HEMOGLOBIN A1C
ESTIMATED AVERAGE GLUCOSE: 114 mg/dL
Hgb A1c MFr Bld: 5.6 % (ref 4.8–5.6)

## 2017-05-29 LAB — ESTRADIOL: ESTRADIOL: 7.6 pg/mL

## 2017-05-29 LAB — TSH: TSH: 1.42 u[IU]/mL (ref 0.450–4.500)

## 2017-06-03 ENCOUNTER — Telehealth: Payer: Self-pay

## 2017-06-03 NOTE — Telephone Encounter (Signed)
-----   Message from Lavonia Drafts, MD sent at 05/26/2017  4:06 PM EST ----- Would you please call this pt and give her info on getting a semen analysis at Dr. Waldon Reining ofc.  Thx, clh-S

## 2017-06-03 NOTE — Telephone Encounter (Signed)
Faxed referral order to [facility] And called patient and made her aware to come pick up instructions and supplies needed in our office. They will call her with appointment date Kathrene Alu RNBSN

## 2017-06-17 ENCOUNTER — Ambulatory Visit: Payer: BLUE CROSS/BLUE SHIELD | Admitting: Obstetrics & Gynecology

## 2017-06-24 ENCOUNTER — Ambulatory Visit (INDEPENDENT_AMBULATORY_CARE_PROVIDER_SITE_OTHER): Payer: BLUE CROSS/BLUE SHIELD | Admitting: Obstetrics & Gynecology

## 2017-06-24 ENCOUNTER — Encounter: Payer: Self-pay | Admitting: Obstetrics & Gynecology

## 2017-06-24 VITALS — BP 128/86 | HR 90 | Wt 256.0 lb

## 2017-06-24 DIAGNOSIS — N939 Abnormal uterine and vaginal bleeding, unspecified: Secondary | ICD-10-CM

## 2017-06-24 DIAGNOSIS — D219 Benign neoplasm of connective and other soft tissue, unspecified: Secondary | ICD-10-CM

## 2017-06-24 NOTE — Progress Notes (Signed)
History:  36 y.o. G1P1 here today for f/u of labs and [place] report. Pt with desired fertility. Her spouse has not completed the semen analysis to date. She is still interested in conceiving. She denies abd pain.  Pt reports that she drinks lots of sweet iced tea. Mainly on the weekends.  She exercises irreg. Sometimes 2-3 x/weeks but, has not exercised in the past few weeks.   The following portions of the patient's history were reviewed and updated as appropriate: allergies, current medications, past family history, past medical history, past social history, past surgical history and problem list.  Review of Systems:  Pertinent items are noted in HPI.   Objective:  Physical Exam Blood pressure 128/86, pulse 90, weight 256 lb (116.1 kg), last menstrual period 05/18/2017. CONSTITUTIONAL: Well-developed, well-nourished female in no acute distress.  HENT:  Normocephalic, atraumatic EYES: Conjunctivae and EOM are normal. No scleral icterus.  NECK: Normal range of motion SKIN: Skin is warm and dry. No rash noted. Not diaphoretic.No pallor. [place]: Alert and oriented to person, place, and time. Normal coordination.   Labs and Imaging US Transvaginal Non-ob  Result Date: 05/26/2017 CLINICAL DATA:  Irregular cycles EXAM: TRANSABDOMINAL AND TRANSVAGINAL ULTRASOUND OF PELVIS TECHNIQUE: Both transabdominal and transvaginal ultrasound examinations of the pelvis were performed. Transabdominal technique was performed for global imaging of the pelvis including uterus, ovaries, adnexal regions, and pelvic cul-de-sac. It was necessary to proceed with endovaginal exam following the transabdominal exam to visualize the uterus, endometrium, ovaries and adnexa . COMPARISON:  CT 04/03/2003 FINDINGS: Uterus Measurements: 6.1 x 2.4 x 4.4 cm. Large posterior subserosal/pedunculated fundal fibroid measures 9.5 x 8.3 x 8.0 cm Endometrium Thickness: 3 mm in thickness.  No focal abnormality. Right ovary Measurements: Not  well visualized due to the large right pelvic fibroid. Left ovary Measurements: 2.1 x 1.2 x 1.5 cm. Normal appearance/no adnexal mass. Other findings No abnormal free fluid. IMPRESSION: Large posterior fundal subserosal/ pedunculated fibroid measuring up to 9.5 cm. Electronically Signed   By: Rolm Baptise M.D.   On: 05/26/2017 08:05   US Pelvis Complete  Result Date: 05/26/2017 CLINICAL DATA:  Irregular cycles EXAM: TRANSABDOMINAL AND TRANSVAGINAL ULTRASOUND OF PELVIS TECHNIQUE: Both transabdominal and transvaginal ultrasound examinations of the pelvis were performed. Transabdominal technique was performed for global imaging of the pelvis including uterus, ovaries, adnexal regions, and pelvic cul-de-sac. It was necessary to proceed with endovaginal exam following the transabdominal exam to visualize the uterus, endometrium, ovaries and adnexa . COMPARISON:  CT 04/03/2003 FINDINGS: Uterus Measurements: 6.1 x 2.4 x 4.4 cm. Large posterior subserosal/pedunculated fundal fibroid measures 9.5 x 8.3 x 8.0 cm Endometrium Thickness: 3 mm in thickness.  No focal abnormality. Right ovary Measurements: Not well visualized due to the large right pelvic fibroid. Left ovary Measurements: 2.1 x 1.2 x 1.5 cm. Normal appearance/no adnexal mass. Other findings No abnormal free fluid. IMPRESSION: Large posterior fundal subserosal/ pedunculated fibroid measuring up to 9.5 cm. Electronically Signed   By: Rolm Baptise M.D.   On: 05/26/2017 08:05    Assessment & Plan:  Uterine fibroid-  The large fibroids appears to be pedunculated and is not symptomatic at present. I have explained to her that eventually it will need to be removed surgically but, it is unlikely to be interfering with her pregnancy. Rec no action at present.   Irreg menses and infertility.  Rec referral to Dr. Kerin Perna.    REviewed that prt is very close to becoming pre- diabetic.   Reviewed diaet and  exercise.   Recommended making a few changes.     Exercise 6/week   Do not drink calories   Limit ice cream (one scoop per night as she likes to eat it daily- change to slow   [place]) F/u in 3 months or sooner prn  Total face-to-face time with patient was 25 min.  Greater than 50% was spent in counseling and coordination of care with the patient.  Tobias Alexander Harraway-Smith, M.D., [place]. Ihor Dow, M.D., [place] .

## 2017-09-11 ENCOUNTER — Other Ambulatory Visit: Payer: Self-pay | Admitting: Internal Medicine

## 2017-09-11 DIAGNOSIS — B20 Human immunodeficiency virus [HIV] disease: Secondary | ICD-10-CM

## 2017-10-13 ENCOUNTER — Other Ambulatory Visit: Payer: Self-pay | Admitting: Family Medicine

## 2017-10-13 DIAGNOSIS — I10 Essential (primary) hypertension: Secondary | ICD-10-CM

## 2017-10-14 ENCOUNTER — Ambulatory Visit: Payer: BLUE CROSS/BLUE SHIELD | Admitting: Obstetrics & Gynecology

## 2017-10-21 ENCOUNTER — Encounter: Payer: Self-pay | Admitting: Obstetrics & Gynecology

## 2017-10-21 ENCOUNTER — Ambulatory Visit (INDEPENDENT_AMBULATORY_CARE_PROVIDER_SITE_OTHER): Payer: Self-pay | Admitting: Obstetrics & Gynecology

## 2017-10-21 VITALS — BP 137/83 | HR 72 | Ht 62.0 in | Wt 257.0 lb

## 2017-10-21 DIAGNOSIS — N939 Abnormal uterine and vaginal bleeding, unspecified: Secondary | ICD-10-CM

## 2017-10-21 NOTE — Progress Notes (Signed)
History:  37 y.o. G1P1 here today for follow up. Pts partner has an appt for semen analysis. Pts cycle length was 50 days.   Pt has not being exercising. She has not yet made an appt withi Dr. Feliberto Harts. She reports that she felt eager to exercise and eati right after her visit but, fell off after a few weeks.  She feel like she needs support with weight management. She watches 'My 600 pound life' regularly and feel like she reeh llel     The following portions of the patient's history were reviewed and updated as appropriate: allergies, current medications, past family history, past medical history, past social history, past surgical history and problem list.  Review of Systems:  Pt and family just reutrned from funeral of husbands grandfather.     Objective:  Physical Exam Blood pressure 137/83, pulse 72, height 5\' 2"  (1.575 m), weight 257 lb (116.6 kg), last menstrual period 10/13/2017. Gen: NAD Abd: Soft, nontender and nondistended Pelvic: Normal appearing external genitalia; normal appearing vaginal mucosa and cervix.  Normal discharge.  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 Feliberto Harts due to her age >35 and the time factor  Note sent to Dr. Etter Sjogren to see if pt is a candidate for the Weight management program at [facility]  Pt will get son to walk with her 4-5x/ week  F/u in 3 months    Total face-to-face time with patient was 15 min.  Greater than 50% was spent in counseling and coordination of care with the patient.   Saida Lonon L. Harraway-Smith, M.D., [place]

## 2017-10-21 NOTE — Progress Notes (Signed)
PT states periods are not heavy but they are irregular

## 2017-10-23 ENCOUNTER — Telehealth: Payer: Self-pay | Admitting: *Deleted

## 2017-10-23 NOTE — Telephone Encounter (Signed)
Info given for healthy weight and wellness

## 2017-10-23 NOTE — Telephone Encounter (Signed)
-----   Message from Ann Held, DO sent at 10/22/2017 11:31 AM EDT -----  Call pt and let her know Dr Maylene Roes Tamala Julian would like her to see healthy weight and wellness ---- please give her the info ----- Message ----- From: Lavonia Drafts, MD Sent: 10/21/2017   3:24 PM To: Ann Held, DO  Would you consider referring this pt to the weight management specialist. She has PCOS and while we're working on infertility it would help if she had help with weight management.   clh-S

## 2017-11-05 ENCOUNTER — Telehealth: Payer: Self-pay | Admitting: Obstetrics & Gynecology

## 2017-11-05 NOTE — Telephone Encounter (Signed)
TC to pt to give the results of the semen analysis. She was driving so I will send the vitamins recommended via MyChart.   All questions answered  Quamaine Webb L. Ihor Dow, M.D., [place] .

## 2017-11-20 ENCOUNTER — Encounter: Payer: Self-pay | Admitting: Family Medicine

## 2017-11-20 ENCOUNTER — Ambulatory Visit: Payer: Managed Care, Other (non HMO) | Admitting: Family Medicine

## 2017-11-20 VITALS — BP 130/86 | HR 72 | Temp 98.4°F | Resp 16 | Ht 62.0 in | Wt 260.2 lb

## 2017-11-20 DIAGNOSIS — I10 Essential (primary) hypertension: Secondary | ICD-10-CM | POA: Diagnosis not present

## 2017-11-20 LAB — COMPREHENSIVE METABOLIC PANEL WITH GFR
AG Ratio: 1.1 (calc) (ref 1.0–2.5)
ALT: 14 U/L (ref 6–29)
AST: 15 U/L (ref 10–30)
Albumin: 3.7 g/dL (ref 3.6–5.1)
Alkaline phosphatase (APISO): 74 U/L (ref 33–115)
BUN: 9 mg/dL (ref 7–25)
CO2: 31 mmol/L (ref 20–32)
Calcium: 9.3 mg/dL (ref 8.6–10.2)
Chloride: 97 mmol/L — ABNORMAL LOW (ref 98–110)
Creat: 0.78 mg/dL (ref 0.50–1.10)
Globulin: 3.3 g/dL (ref 1.9–3.7)
Glucose, Bld: 81 mg/dL (ref 65–99)
Potassium: 3.8 mmol/L (ref 3.5–5.3)
Sodium: 135 mmol/L (ref 135–146)
Total Bilirubin: 0.3 mg/dL (ref 0.2–1.2)
Total Protein: 7 g/dL (ref 6.1–8.1)

## 2017-11-20 LAB — LIPID PANEL
Cholesterol: 214 mg/dL — ABNORMAL HIGH
HDL: 34 mg/dL — ABNORMAL LOW
LDL Cholesterol (Calc): 152 mg/dL — ABNORMAL HIGH
Non-HDL Cholesterol (Calc): 180 mg/dL — ABNORMAL HIGH
Total CHOL/HDL Ratio: 6.3 (calc) — ABNORMAL HIGH
Triglycerides: 150 mg/dL — ABNORMAL HIGH

## 2017-11-20 MED ORDER — ATENOLOL 25 MG PO TABS: 25.0000 mg | ORAL_TABLET | Freq: Every day | ORAL | 1 refills | Status: DC

## 2017-11-20 MED ORDER — HYDROCHLOROTHIAZIDE 25 MG PO TABS: ORAL_TABLET | ORAL | 1 refills | Status: DC

## 2017-11-20 NOTE — Patient Instructions (Signed)
DASH Eating Plan DASH stands for "Dietary Approaches to Stop Hypertension." The DASH eating plan is a healthy eating plan that has been shown to reduce high blood pressure (hypertension). It may also reduce your risk for type 2 diabetes, heart disease, and stroke. The DASH eating plan may also help with weight loss. What are tips for following this plan? General guidelines  Avoid eating more than 2,300 mg (milligrams) of salt (sodium) a day. If you have hypertension, you may need to reduce your sodium intake to 1,500 mg a day.  Limit alcohol intake to no more than 1 drink a day for nonpregnant women and 2 drinks a day for men. One drink equals 12 oz of beer, 5 oz of wine, or 1 oz of hard liquor.  Work with your health care provider to maintain a healthy body weight or to lose weight. Ask what an ideal weight is for you.  Get at least 30 minutes of exercise that causes your heart to beat faster (aerobic exercise) most days of the week. Activities may include walking, swimming, or biking.  Work with your health care provider or diet and nutrition specialist (dietitian) to adjust your eating plan to your individual calorie needs. Reading food labels  Check food labels for the amount of sodium per serving. Choose foods with less than 5 percent of the Daily Value of sodium. Generally, foods with less than 300 mg of sodium per serving fit into this eating plan.  To find whole grains, look for the word "whole" as the first word in the ingredient list. Shopping  Buy products labeled as "low-sodium" or "no salt added."  Buy fresh foods. Avoid canned foods and premade or frozen meals. Cooking  Avoid adding salt when cooking. Use salt-free seasonings or herbs instead of table salt or sea salt. Check with your health care provider or pharmacist before using salt substitutes.  Do not fry foods. Cook foods using healthy methods such as baking, boiling, grilling, and broiling instead.  Cook with  heart-healthy oils, such as olive, canola, soybean, or sunflower oil. Meal planning   Eat a balanced diet that includes: ? 5 or more servings of fruits and vegetables each day. At each meal, try to fill half of your plate with fruits and vegetables. ? Up to 6-8 servings of whole grains each day. ? Less than 6 oz of lean meat, poultry, or fish each day. A 3-oz serving of meat is about the same size as a deck of cards. One egg equals 1 oz. ? 2 servings of low-fat dairy each day. ? A serving of nuts, seeds, or beans 5 times each week. ? Heart-healthy fats. Healthy fats called Omega-3 fatty acids are found in foods such as flaxseeds and coldwater fish, like sardines, salmon, and mackerel.  Limit how much you eat of the following: ? Canned or prepackaged foods. ? Food that is high in trans fat, such as fried foods. ? Food that is high in saturated fat, such as fatty meat. ? Sweets, desserts, sugary drinks, and other foods with added sugar. ? Full-fat dairy products.  Do not salt foods before eating.  Try to eat at least 2 vegetarian meals each week.  Eat more home-cooked food and less restaurant, buffet, and fast food.  When eating at a restaurant, ask that your food be prepared with less salt or no salt, if possible. What foods are recommended? The items listed may not be a complete list. Talk with your dietitian about what   dietary choices are best for you. Grains Whole-grain or whole-wheat bread. Whole-grain or whole-wheat pasta. Brown rice. Oatmeal. Quinoa. Bulgur. Whole-grain and low-sodium cereals. Pita bread. Low-fat, low-sodium crackers. Whole-wheat flour tortillas. Vegetables Fresh or frozen vegetables (raw, steamed, roasted, or grilled). Low-sodium or reduced-sodium tomato and vegetable juice. Low-sodium or reduced-sodium tomato sauce and tomato paste. Low-sodium or reduced-sodium canned vegetables. Fruits All fresh, dried, or frozen fruit. Canned fruit in natural juice (without  added sugar). Meat and other protein foods Skinless chicken or turkey. Ground chicken or turkey. Pork with fat trimmed off. Fish and seafood. Egg whites. Dried beans, peas, or lentils. Unsalted nuts, nut butters, and seeds. Unsalted canned beans. Lean cuts of beef with fat trimmed off. Low-sodium, lean deli meat. Dairy Low-fat (1%) or fat-free (skim) milk. Fat-free, low-fat, or reduced-fat cheeses. Nonfat, low-sodium ricotta or cottage cheese. Low-fat or nonfat yogurt. Low-fat, low-sodium cheese. Fats and oils Soft margarine without trans fats. Vegetable oil. Low-fat, reduced-fat, or light mayonnaise and salad dressings (reduced-sodium). Canola, safflower, olive, soybean, and sunflower oils. Avocado. Seasoning and other foods Herbs. Spices. Seasoning mixes without salt. Unsalted popcorn and pretzels. Fat-free sweets. What foods are not recommended? The items listed may not be a complete list. Talk with your dietitian about what dietary choices are best for you. Grains Baked goods made with fat, such as croissants, muffins, or some breads. Dry pasta or rice meal packs. Vegetables Creamed or fried vegetables. Vegetables in a cheese sauce. Regular canned vegetables (not low-sodium or reduced-sodium). Regular canned tomato sauce and paste (not low-sodium or reduced-sodium). Regular tomato and vegetable juice (not low-sodium or reduced-sodium). Pickles. Olives. Fruits Canned fruit in a light or heavy syrup. Fried fruit. Fruit in cream or butter sauce. Meat and other protein foods Fatty cuts of meat. Ribs. Fried meat. Bacon. Sausage. Bologna and other processed lunch meats. Salami. Fatback. Hotdogs. Bratwurst. Salted nuts and seeds. Canned beans with added salt. Canned or smoked fish. Whole eggs or egg yolks. Chicken or turkey with skin. Dairy Whole or 2% milk, cream, and half-and-half. Whole or full-fat cream cheese. Whole-fat or sweetened yogurt. Full-fat cheese. Nondairy creamers. Whipped toppings.  Processed cheese and cheese spreads. Fats and oils Butter. Stick margarine. Lard. Shortening. Ghee. Bacon fat. Tropical oils, such as coconut, palm kernel, or palm oil. Seasoning and other foods Salted popcorn and pretzels. Onion salt, garlic salt, seasoned salt, table salt, and sea salt. Worcestershire sauce. Tartar sauce. Barbecue sauce. Teriyaki sauce. Soy sauce, including reduced-sodium. Steak sauce. Canned and packaged gravies. Fish sauce. Oyster sauce. Cocktail sauce. Horseradish that you find on the shelf. Ketchup. Mustard. Meat flavorings and tenderizers. Bouillon cubes. Hot sauce and Tabasco sauce. Premade or packaged marinades. Premade or packaged taco seasonings. Relishes. Regular salad dressings. Where to find more information:  National Heart, Lung, and Blood Institute: www.nhlbi.nih.gov  American Heart Association: www.heart.org Summary  The DASH eating plan is a healthy eating plan that has been shown to reduce high blood pressure (hypertension). It may also reduce your risk for type 2 diabetes, heart disease, and stroke.  With the DASH eating plan, you should limit salt (sodium) intake to 2,300 mg a day. If you have hypertension, you may need to reduce your sodium intake to 1,500 mg a day.  When on the DASH eating plan, aim to eat more fresh fruits and vegetables, whole grains, lean proteins, low-fat dairy, and heart-healthy fats.  Work with your health care provider or diet and nutrition specialist (dietitian) to adjust your eating plan to your individual   calorie needs. This information is not intended to replace advice given to you by your health care provider. Make sure you discuss any questions you have with your health care provider. Document Released: 06/19/2011 Document Revised: 06/23/2016 Document Reviewed: 06/23/2016 Elsevier Interactive Patient Education  2018 Elsevier Inc.  

## 2017-11-20 NOTE — Assessment & Plan Note (Signed)
-   Refer to healthy weight and wellness 

## 2017-11-20 NOTE — Progress Notes (Signed)
Patient ID: Autumn Curry, female   DOB: 1981/07/05, 37 y.o.   MRN: [id number]    Subjective:  I acted as a Education administrator for Dr. Carollee Herter.  Guerry Bruin, [place]   Patient ID: Autumn Curry, female    DOB: [birth year], 37 y.o.   MRN: [id number]  Chief Complaint  Patient presents with  . Hypertension    HPI  Patient is in today for follow up blood pressure. Pt has not been here since last summer and she ran out of her bp meds a few days ago.  No complaints except she is struggling to lose weight.    Patient Care Team: Carollee Herter, Alferd Apa, DO as PCP - General (Family Medicine) Michel Bickers, MD as PCP - Infectious Diseases (Infectious Diseases) Molli Posey, MD as Consulting Physician (Obstetrics and Gynecology)   Past Medical History:  Diagnosis Date  . Absence of menstruation   . Carbuncle and furuncle of unspecified site   . Elevated blood pressure reading without diagnosis of hypertension   . Encounter for long-term (current) use of other medications   . Headache(784.0)   . HIV infection ([place]) 2008   dx AIDS  . Hyperlipidemia   . Hypertension   . Routine general medical examination at a health care facility   . Screening for malignant neoplasm of the cervix   . Syphilis, unspecified     No past surgical history on file.  Family History  Problem Relation Age of Onset  . Hyperlipidemia Mother   . Coronary artery disease Father   . Diabetes Father   . Heart disease Father   . Hyperlipidemia Father     Social History   Socioeconomic History  . Marital status: Married    Spouse name: Not on file  . Number of children: 1  . Years of education: Not on file  . Highest education level: Not on file  Occupational History    Comment: head start   Social Needs  . Financial resource strain: Not on file  . Food insecurity:    Worry: Not on file    Inability: Not on file  . Transportation needs:    Medical: Not on file    Non-medical: Not on file  Tobacco Use  .  Smoking status: Never Smoker  . Smokeless tobacco: Never Used  Substance and Sexual Activity  . Alcohol use: No    Alcohol/week: 0.0 oz  . Drug use: No  . Sexual activity: Yes    Partners: Male    Birth control/protection: Condom    Comment: given condoms  Lifestyle  . Physical activity:    Days per week: Not on file    Minutes per session: Not on file  . Stress: Not on file  Relationships  . Social connections:    Talks on phone: Not on file    Gets together: Not on file    Attends religious service: Not on file    Active member of club or organization: Not on file    Attends meetings of clubs or organizations: Not on file    Relationship status: Not on file  . Intimate partner violence:    Fear of current or ex partner: Not on file    Emotionally abused: Not on file    Physically abused: Not on file    Forced sexual activity: Not on file  Other Topics Concern  . Not on file  Social History Narrative  . Not on file    Outpatient Medications  Prior to Visit  Medication Sig Dispense Refill  . DESCOVY 200-25 MG tablet TAKE ONE TABLET BY MOUTH ONCE DAILY WITH OR WITHOUT FOOD. 30 tablet 4  . eletriptan (RELPAX) 20 MG tablet Take 1 tablet (20 mg total) by mouth as needed for migraine or headache. One tablet by mouth at onset of headache. May repeat in 2 hours if headache persists or recurs. 10 tablet 0  . Multiple Vitamins-Minerals (ALIVE WOMENS ENERGY) TABS Take 1 tablet by mouth daily.    [name] PREZCOBIX 800-150 MG tablet TAKE ONE TABLET BY MOUTH ONCE DAILY WITH FOOD. STORE AT ROOM TEMPERATURE. 30 tablet 4  . valACYclovir (VALTREX) 500 MG tablet Take 1 tablet (500 mg total) by mouth 2 (two) times daily. 10 tablet 5  . atenolol (TENORMIN) 25 MG tablet Take 1 tablet (25 mg total) by mouth daily. 90 tablet 2  . hydrochlorothiazide (HYDRODIURIL) 25 MG tablet TAKE 1 TABLET BY MOUTH ONCE DAILY. MUST HAVE OFFICE VISIT BEFORE FURTHER REFILLS. 30 tablet 5  . amoxicillin-clavulanate  (AUGMENTIN) 875-125 MG tablet Take 1 tablet by mouth 2 (two) times daily. (Patient not taking: Reported on 05/25/2017) 20 tablet 0  . levocetirizine (XYZAL) 5 MG tablet Take 1 tablet (5 mg total) by mouth every evening. (Patient not taking: Reported on 10/21/2017) 30 tablet 5  . promethazine-dextromethorphan (PROMETHAZINE-DM) 6.25-15 MG/5ML syrup Take 5 mLs by mouth 4 (four) times daily as needed. (Patient not taking: Reported on 05/25/2017) 118 mL 0  . SUMAtriptan (IMITREX) 50 MG tablet Take 1 tablet (50 mg total) by mouth every 2 (two) hours as needed for migraine. (Patient not taking: Reported on 05/25/2017) 10 tablet 2   No facility-administered medications prior to visit.     No Known Allergies  Review of Systems  Constitutional: Negative for fever and malaise/fatigue.  HENT: Negative for congestion.   Eyes: Negative for blurred vision.  Respiratory: Negative for cough and shortness of breath.   Cardiovascular: Positive for leg swelling. Negative for chest pain and palpitations.  Gastrointestinal: Negative for vomiting.  Musculoskeletal: Negative for back pain.  Skin: Negative for rash.  Neurological: Negative for loss of consciousness and headaches.       Objective:    Physical Exam  Constitutional: She is oriented to person, place, and time. She appears well-developed and well-nourished.  HENT:  Head: Normocephalic and atraumatic.  Eyes: Conjunctivae and EOM are normal.  Neck: Normal range of motion. Neck supple. No JVD present. Carotid bruit is not present. No thyromegaly present.  Cardiovascular: Normal rate, regular rhythm and normal heart sounds.  No murmur heard. Pulmonary/Chest: Effort normal and breath sounds normal. No respiratory distress. She has no wheezes. She has no rales. She exhibits no tenderness.  Musculoskeletal: She exhibits edema.  Neurological: She is alert and oriented to person, place, and time.  Psychiatric: She has a normal mood and affect.  Nursing  note and vitals reviewed.   BP 130/86 (BP Location: Right Arm, Cuff Size: Large)   Pulse 72   Temp 98.4 F (36.9 C) (Oral)   Resp 16   Ht 5\' 2"  (1.575 m)   Wt 260 lb 3.2 oz (118 kg)   SpO2 98%   BMI 47.59 kg/m  Wt Readings from Last 3 Encounters:  11/20/17 260 lb 3.2 oz (118 kg)  10/21/17 257 lb (116.6 kg)  06/24/17 256 lb (116.1 kg)   BP Readings from Last 3 Encounters:  11/20/17 130/86  10/21/17 137/83  06/24/17 128/86     Immunization History  Administered Date(s) Administered  . Hepatitis B 09/11/2006, 12/23/2006, 07/14/2007  . Influenza Split 10/01/2010, 05/26/2011, 05/05/2012  . Influenza Whole 07/14/2007, 04/25/2008, 09/24/2009  . Influenza,inj,Quad PF,6+ Mos 05/19/2013, 08/28/2015, 06/25/2016  . Pneumococcal Polysaccharide-23 07/04/2004, 10/01/2010  . Tdap 07/11/2016    Health Maintenance  Topic Date Due  . PAP SMEAR  07/11/2017  . INFLUENZA VACCINE  02/11/2018  . TETANUS/TDAP  07/11/2026  . HIV Screening  Completed    Lab Results  Component Value Date   WBC 6.6 08/18/2016   HGB 12.2 08/18/2016   HCT 37.0 08/18/2016   PLT 323.0 08/18/2016   GLUCOSE 79 09/02/2016   CHOL 196 08/18/2016   TRIG 106.0 08/18/2016   HDL 40.90 08/18/2016   LDLCALC 134 (H) 08/18/2016   ALT 18 08/18/2016   AST 19 08/18/2016   NA 135 09/02/2016   K 3.5 09/02/2016   CL 100 09/02/2016   CREATININE 0.92 09/02/2016   BUN 16 09/02/2016   CO2 28 09/02/2016   TSH 1.420 05/25/2017   HGBA1C 5.6 05/25/2017    Lab Results  Component Value Date   TSH 1.420 05/25/2017   Lab Results  Component Value Date   WBC 6.6 08/18/2016   HGB 12.2 08/18/2016   HCT 37.0 08/18/2016   MCV 91.0 08/18/2016   PLT 323.0 08/18/2016   Lab Results  Component Value Date   NA 135 09/02/2016   K 3.5 09/02/2016   CO2 28 09/02/2016   GLUCOSE 79 09/02/2016   BUN 16 09/02/2016   CREATININE 0.92 09/02/2016   BILITOT 0.3 08/18/2016   ALKPHOS 72 08/18/2016   AST 19 08/18/2016   ALT 18 08/18/2016    PROT 7.7 08/18/2016   ALBUMIN 3.9 08/18/2016   CALCIUM 9.5 09/02/2016   GFR 88.61 09/02/2016   Lab Results  Component Value Date   CHOL 196 08/18/2016   Lab Results  Component Value Date   HDL 40.90 08/18/2016   Lab Results  Component Value Date   LDLCALC 134 (H) 08/18/2016   Lab Results  Component Value Date   TRIG 106.0 08/18/2016   Lab Results  Component Value Date   CHOLHDL 5 08/18/2016   Lab Results  Component Value Date   HGBA1C 5.6 05/25/2017         Assessment & Plan:   Problem List Items Addressed This Visit      Unprioritized   Hypertension - Primary    Poorly controlled will alter medications, encouraged DASH diet, minimize caffeine and obtain adequate sleep. Report concerning symptoms and follow up as directed and as needed----- restart bp meds      Relevant Medications   atenolol (TENORMIN) 25 MG tablet   hydrochlorothiazide (HYDRODIURIL) 25 MG tablet   Other Relevant Orders   Amb Ref to Medical Weight Management   Lipid panel   Comprehensive metabolic panel   Morbid obesity ([place])    Refer to healthy weight and wellness      Relevant Orders   Amb Ref to Medical Weight Management      I have discontinued Ryver Zadrozny. Harriss's SUMAtriptan, promethazine-dextromethorphan, amoxicillin-clavulanate, and levocetirizine. I am also having her maintain her ALIVE WOMENS ENERGY, eletriptan, valACYclovir, PREZCOBIX, DESCOVY, atenolol, and hydrochlorothiazide.  Meds ordered this encounter  Medications  . atenolol (TENORMIN) 25 MG tablet    Sig: Take 1 tablet (25 mg total) by mouth daily.    Dispense:  90 tablet    Refill:  1  . hydrochlorothiazide (HYDRODIURIL) 25 MG tablet  Sig: TAKE 1 TABLET BY MOUTH ONCE DAILY. MUST HAVE OFFICE VISIT BEFORE FURTHER REFILLS.    Dispense:  90 tablet    Refill:  1    Please consider 90 day supplies to promote better adherence    CMA served as scribe during this visit. History, Physical and Plan performed by  medical provider. Documentation and orders reviewed and attested to.  Ann Held, DO

## 2017-11-20 NOTE — Assessment & Plan Note (Signed)
Poorly controlled will alter medications, encouraged DASH diet, minimize caffeine and obtain adequate sleep. Report concerning symptoms and follow up as directed and as needed----- restart bp meds

## 2017-12-10 ENCOUNTER — Encounter (INDEPENDENT_AMBULATORY_CARE_PROVIDER_SITE_OTHER): Payer: Self-pay

## 2017-12-14 ENCOUNTER — Encounter (INDEPENDENT_AMBULATORY_CARE_PROVIDER_SITE_OTHER): Payer: Self-pay | Admitting: Family Medicine

## 2017-12-14 ENCOUNTER — Encounter (INDEPENDENT_AMBULATORY_CARE_PROVIDER_SITE_OTHER): Payer: Self-pay

## 2017-12-14 ENCOUNTER — Ambulatory Visit (INDEPENDENT_AMBULATORY_CARE_PROVIDER_SITE_OTHER): Payer: Managed Care, Other (non HMO) | Admitting: Family Medicine

## 2017-12-14 VITALS — BP 122/87 | HR 70 | Temp 97.9°F | Ht 62.0 in | Wt 253.0 lb

## 2017-12-14 DIAGNOSIS — R0602 Shortness of breath: Secondary | ICD-10-CM | POA: Diagnosis not present

## 2017-12-14 DIAGNOSIS — I10 Essential (primary) hypertension: Secondary | ICD-10-CM

## 2017-12-14 DIAGNOSIS — Z1331 Encounter for screening for depression: Secondary | ICD-10-CM | POA: Diagnosis not present

## 2017-12-14 DIAGNOSIS — Z6841 Body Mass Index (BMI) 40.0 and over, adult: Secondary | ICD-10-CM | POA: Diagnosis not present

## 2017-12-14 DIAGNOSIS — Z0289 Encounter for other administrative examinations: Secondary | ICD-10-CM

## 2017-12-14 DIAGNOSIS — R5383 Other fatigue: Secondary | ICD-10-CM | POA: Diagnosis not present

## 2017-12-14 DIAGNOSIS — Z9189 Other specified personal risk factors, not elsewhere classified: Secondary | ICD-10-CM

## 2017-12-14 NOTE — Progress Notes (Signed)
Office: [phone]  /  Fax: [phone]   Dear Dr. Carollee Herter,   Thank you for referring Autumn Curry to our clinic. The following note includes my evaluation and treatment recommendations.  HPI:   Chief Complaint: OBESITY    Autumn Curry has been referred by Ann Held, DO for consultation regarding her obesity and obesity related comorbidities.    Autumn Curry (MR# [id number]) is a 37 y.o. female who presents on 12/14/2017 for obesity evaluation and treatment. Current BMI is Body mass index is 46.27 kg/m.Autumn Curry [place] has been struggling with her weight for many years and has been unsuccessful in either losing weight, maintaining weight loss, or reaching her healthy weight goal.     Tinaya attended our information session and states she is currently in the action stage of change and ready to dedicate time achieving and maintaining a healthier weight. Dynisha is interested in becoming our patient and working on intensive lifestyle modifications including (but not limited to) diet, exercise and weight loss.    Janiya states her family eats meals together she thinks her family will eat healthier with  her she struggles with family and or coworkers weight loss sabotage her desired weight loss is 93 lbs she has been heavy most of  her life she started gaining weight about 12 years ago her heaviest weight ever was 257 lbs she has significant food cravings issues  she snacks frequently in the evenings she is frequently drinking liquids with calories she frequently makes poor food choices she has problems with excessive hunger  she frequently eats larger portions than normal  she struggles with emotional eating    Fatigue Autumn Curry feels her energy is lower than it should be. This has worsened with weight gain and has not worsened recently. Autumn Curry admits to daytime somnolence and  denies waking up still tired. Patient is at risk for obstructive sleep apnea. Patent  has a history of symptoms of daytime fatigue and morning headache. Patient generally gets 7 hours of sleep per night, and states they generally have generally restful sleep. Snoring is present. Apneic episodes are not present. Epworth Sleepiness Score is 6.  Dyspnea on exertion Autumn Curry notes increasing shortness of breath with exercising and seems to be worsening over time with weight gain. She notes getting out of breath sooner with activity than she used to. This has not gotten worse recently. EKG-within normal limits, normal sinus rhythm. Amoria denies orthopnea.  Hypertension LASHAE WOLLENBERG is a 37 y.o. female with hypertension. Autumn Curry's blood pressure is controlled today. She denies chest pain, chest pressure, or headache. She is working weight loss to help control her blood pressure with the goal of decreasing her risk of heart attack and stroke.   At risk for cardiovascular disease Autumn Curry is at a higher than average risk for cardiovascular disease due to obesity and hypertension. She currently denies any chest pain.  Depression Screen Autumn Curry's Food and Mood (modified PHQ-9) score was  Depression screen PHQ 2/9 12/14/2017  Decreased Interest 3  Down, Depressed, Hopeless 2  PHQ - 2 Score 5  Altered sleeping 0  Tired, decreased energy 1  Change in appetite 1  Feeling bad or failure about yourself  3  Trouble concentrating 0  Moving slowly or fidgety/restless 1  Suicidal thoughts 0  PHQ-9 Score 11  Difficult doing work/chores Somewhat difficult    ALLERGIES: No Known Allergies  MEDICATIONS: Current Outpatient Medications on File Prior to Visit  Medication Sig Dispense Refill  .  aspirin-acetaminophen-caffeine (EXCEDRIN MIGRAINE) 250-250-65 MG tablet Take by mouth every 6 (six) hours as needed for headache.    Autumn Curry atenolol (TENORMIN) 25 MG tablet Take 1 tablet (25 mg total) by mouth daily. 90 tablet 1  . DESCOVY 200-25 MG tablet TAKE ONE TABLET BY MOUTH ONCE DAILY WITH OR  WITHOUT FOOD. 30 tablet 4  . hydrochlorothiazide (HYDRODIURIL) 25 MG tablet TAKE 1 TABLET BY MOUTH ONCE DAILY. MUST HAVE OFFICE VISIT BEFORE FURTHER REFILLS. 90 tablet 1  . Ibuprofen 200 MG CAPS Take by mouth.    . loratadine (CLARITIN) 10 MG tablet Take 10 mg by mouth daily.    . Multiple Vitamins-Minerals (ALIVE WOMENS ENERGY) TABS Take 1 tablet by mouth daily.    . Omega-3-6-9 CAPS Take by mouth daily.    Autumn Curry PREZCOBIX 800-150 MG tablet TAKE ONE TABLET BY MOUTH ONCE DAILY WITH FOOD. STORE AT ROOM TEMPERATURE. 30 tablet 4  . eletriptan (RELPAX) 20 MG tablet Take 1 tablet (20 mg total) by mouth as needed for migraine or headache. One tablet by mouth at onset of headache. May repeat in 2 hours if headache persists or recurs. 10 tablet 0  . valACYclovir (VALTREX) 500 MG tablet Take 1 tablet (500 mg total) by mouth 2 (two) times daily. 10 tablet 5   No current facility-administered medications on file prior to visit.     PAST MEDICAL HISTORY: Past Medical History:  Diagnosis Date  . Absence of menstruation   . Carbuncle and furuncle of unspecified site   . Elevated blood pressure reading without diagnosis of hypertension   . Encounter for long-term (current) use of other medications   . Headache(784.0)   . HIV infection ([place]) 2008   dx AIDS  . Hyperlipidemia   . Hypertension   . Routine general medical examination at a health care facility   . Screening for malignant neoplasm of the cervix   . Syphilis, unspecified     PAST SURGICAL HISTORY: History reviewed. No pertinent surgical history.  SOCIAL HISTORY: Social History   Tobacco Use  . Smoking status: Never Smoker  . Smokeless tobacco: Never Used  Substance Use Topics  . Alcohol use: No    Alcohol/week: 0.0 oz  . Drug use: No    FAMILY HISTORY: Family History  Problem Relation Age of Onset  . Hyperlipidemia Mother   . Hypertension Mother   . Depression Mother   . Coronary artery disease Father   . Diabetes Father     . Heart disease Father   . Hyperlipidemia Father   . Stroke Father   . Kidney disease Father     ROS: Review of Systems  Constitutional: Positive for malaise/fatigue. Negative for weight loss.  Respiratory: Positive for shortness of breath (with exertion).   Cardiovascular: Negative for chest pain.       Negative chest pressure + Calf/leg pain with walking (ankles)  Neurological: Negative for headaches.    PHYSICAL EXAM: Blood pressure 122/87, pulse 70, temperature 97.9 F (36.6 C), temperature source Oral, height 5\' 2"  (1.575 m), weight 253 lb (114.8 kg), last menstrual period 11/27/2017, SpO2 98 %. Body mass index is 46.27 kg/m. Physical Exam  Constitutional: She is oriented to person, place, and time. She appears well-developed and well-nourished.  HENT:  Head: Normocephalic and atraumatic.  Nose: Nose normal.  Eyes: EOM are normal. No scleral icterus.  Neck: Normal range of motion. Neck supple. No thyromegaly present.  Cardiovascular: Normal rate and regular rhythm.  Pulmonary/Chest: Effort normal. No  respiratory distress.  Abdominal: Soft. There is no tenderness.  + Obesity  Musculoskeletal:  Range of Motion normal in all 4 extremities Trace edema noted in bilateral lower extremities  Neurological: She is alert and oriented to person, place, and time. Coordination normal.  Skin: Skin is warm and dry.  Psychiatric: She has a normal mood and affect. Her behavior is normal.  Vitals reviewed.   RECENT LABS AND TESTS: BMET    Component Value Date/Time   NA 135 11/20/2017 1647   K 3.8 11/20/2017 1647   CL 97 (L) 11/20/2017 1647   CO2 31 11/20/2017 1647   GLUCOSE 81 11/20/2017 1647   BUN 9 11/20/2017 1647   CREATININE 0.78 11/20/2017 1647   CALCIUM 9.3 11/20/2017 1647   GFRNONAA 73 06/12/2016 1545   GFRAA 84 06/12/2016 1545   Lab Results  Component Value Date   HGBA1C 5.6 05/25/2017   No results found for: INSULIN CBC    Component Value Date/Time   WBC  6.6 08/18/2016 1624   RBC 4.07 08/18/2016 1624   HGB 12.2 08/18/2016 1624   HCT 37.0 08/18/2016 1624   PLT 323.0 08/18/2016 1624   MCV 91.0 08/18/2016 1624   MCH 29.3 06/12/2016 1545   MCHC 32.9 08/18/2016 1624   RDW 13.5 08/18/2016 1624   LYMPHSABS 2,784 06/12/2016 1545   MONOABS 348 06/12/2016 1545   EOSABS 116 06/12/2016 1545   BASOSABS 58 06/12/2016 1545   Iron/TIBC/Ferritin/ %Sat No results found for: IRON, TIBC, FERRITIN, IRONPCTSAT Lipid Panel     Component Value Date/Time   CHOL 214 (H) 11/20/2017 1647   TRIG 150 (H) 11/20/2017 1647   HDL 34 (L) 11/20/2017 1647   CHOLHDL 6.3 (H) 11/20/2017 1647   VLDL 21.2 08/18/2016 1624   LDLCALC 152 (H) 11/20/2017 1647   Hepatic Function Panel     Component Value Date/Time   PROT 7.0 11/20/2017 1647   ALBUMIN 3.9 08/18/2016 1624   AST 15 11/20/2017 1647   ALT 14 11/20/2017 1647   ALKPHOS 72 08/18/2016 1624   BILITOT 0.3 11/20/2017 1647      Component Value Date/Time   TSH 1.420 05/25/2017 0921   TSH 1.96 08/18/2016 1624    ECG  shows NSR with a rate of 67 BPM INDIRECT CALORIMETER done today shows a VO2 of 178 and a REE of 1241.  Her calculated basal metabolic rate is 1062 thus her basal metabolic rate is worse than expected.    ASSESSMENT AND PLAN: Other fatigue - Plan: EKG 12-Lead, CBC With Differential, Hemoglobin A1c, Insulin, random, VITAMIN D 25 Hydroxy (Vit-D Deficiency, Fractures), Vitamin B12, Folate, T3, T4, free, TSH  Shortness of breath on exertion - Plan: CBC With Differential  Essential hypertension  Depression screening  At risk for heart disease  Class 3 severe obesity with serious comorbidity and body mass index (BMI) of 45.0 to 49.9 in adult, unspecified obesity type ([place])  PLAN:  Fatigue Margaretann was informed that her fatigue may be related to obesity, depression or many other causes. Labs will be ordered, and in the meanwhile Jasmaine has agreed to work on diet, exercise and weight loss to  help with fatigue. Proper sleep hygiene was discussed including the need for 7-8 hours of quality sleep each night. A sleep study was not ordered based on symptoms and Epworth score.  Dyspnea on exertion Camay's shortness of breath appears to be obesity related and exercise induced. She has agreed to work on weight loss and gradually increase exercise  to treat her exercise induced shortness of breath. If Christl follows our instructions and loses weight without improvement of her shortness of breath, we will plan to refer to pulmonology. We will monitor this condition regularly. Ardie agrees to this plan.  Hypertension We discussed sodium restriction, working on healthy weight loss, and a regular exercise program as the means to achieve improved blood pressure control. Sharena agreed with this plan and agreed to follow up as directed. We will continue to monitor her blood pressure as well as her progress with the above lifestyle modifications. She will continue her medications as prescribed and will watch for signs of hypotension as she continues her lifestyle modifications. Recent CMP within normal limits and Melvin agrees to follow up with our clinic in 2 weeks.  Cardiovascular risk counselling Loralye was given extended (15 minutes) coronary artery disease prevention counseling today. She is 37 y.o. female and has risk factors for heart disease including obesity and hypertension. We discussed intensive lifestyle modifications today with an emphasis on specific weight loss instructions and strategies. Pt was also informed of the importance of increasing exercise and decreasing saturated fats to help prevent heart disease.  Depression Screen Tearia had a moderately positive depression screening. Depression is commonly associated with obesity and often results in emotional eating behaviors. We will monitor this closely and work on CBT to help improve the non-hunger eating patterns. Referral to  Psychology may be required if no improvement is seen as she continues in our clinic.  Obesity Latronda is currently in the action stage of change and her goal is to continue with weight loss efforts. I recommend Adrie begin the structured treatment plan as follows:  She has agreed to follow the Category 2 plan Lauris has been instructed to eventually work up to a goal of 150 minutes of combined cardio and strengthening exercise per week for weight loss and overall health benefits. We discussed the following Behavioral Modification Strategies today: increasing lean protein intake, increasing vegetables, work on meal planning and easy cooking plans, better snacking choices, and planning for success   She was informed of the importance of frequent follow up visits to maximize her success with intensive lifestyle modifications for her multiple health conditions. She was informed we would discuss her lab results at her next visit unless there is a critical issue that needs to be addressed sooner. Christianne agreed to keep her next visit at the agreed upon time to discuss these results.    OBESITY BEHAVIORAL INTERVENTION VISIT  Today's visit was # 1 out of 22.  Starting weight: 253 lbs Starting date: 12/14/17 Today's weight : 253 lbs  Today's date: 12/14/2017 Total lbs lost to date: 0 (Patients must lose 7 lbs in the first 6 months to continue with counseling)   ASK: We discussed the diagnosis of obesity with Geryl Councilman today and Rosamary agreed to give [place] permission to discuss obesity behavioral modification therapy today.  ASSESS: Armella has the diagnosis of obesity and her BMI today is 46.26 Twilla is in the action stage of change   ADVISE: Donnah was educated on the multiple health risks of obesity as well as the benefit of weight loss to improve her health. She was advised of the need for long term treatment and the importance of lifestyle modifications.  AGREE: Multiple  dietary modification options and treatment options were discussed and  Shawny agreed to the above obesity treatment plan.   Wilhemena Durie, am acting as Location manager for [place]  Adair Patter, MD  I have reviewed the above documentation for accuracy and completeness, and I agree with the above. - Ilene Qua, MD

## 2017-12-15 LAB — CBC WITH DIFFERENTIAL
BASOS: 0 %
Basophils Absolute: 0 10*3/uL (ref 0.0–0.2)
EOS (ABSOLUTE): 0.2 10*3/uL (ref 0.0–0.4)
EOS: 2 %
HEMATOCRIT: 39.3 % (ref 34.0–46.6)
HEMOGLOBIN: 12.5 g/dL (ref 11.1–15.9)
IMMATURE GRANS (ABS): 0 10*3/uL (ref 0.0–0.1)
Immature Granulocytes: 0 %
LYMPHS: 51 %
Lymphocytes Absolute: 3.6 10*3/uL — ABNORMAL HIGH (ref 0.7–3.1)
MCH: 29.2 pg (ref 26.6–33.0)
MCHC: 31.8 g/dL (ref 31.5–35.7)
MCV: 92 fL (ref 79–97)
Monocytes Absolute: 0.4 10*3/uL (ref 0.1–0.9)
Monocytes: 5 %
NEUTROS PCT: 42 %
Neutrophils Absolute: 3 10*3/uL (ref 1.4–7.0)
RBC: 4.28 x10E6/uL (ref 3.77–5.28)
RDW: 14 % (ref 12.3–15.4)
WBC: 7.2 10*3/uL (ref 3.4–10.8)

## 2017-12-15 LAB — T3: T3, Total: 111 ng/dL (ref 71–180)

## 2017-12-15 LAB — HEMOGLOBIN A1C
Est. average glucose Bld gHb Est-mCnc: 117 mg/dL
Hgb A1c MFr Bld: 5.7 % — ABNORMAL HIGH (ref 4.8–5.6)

## 2017-12-15 LAB — INSULIN, RANDOM: INSULIN: 31.4 u[IU]/mL — AB (ref 2.6–24.9)

## 2017-12-15 LAB — TSH: TSH: 1.71 u[IU]/mL (ref 0.450–4.500)

## 2017-12-15 LAB — VITAMIN D 25 HYDROXY (VIT D DEFICIENCY, FRACTURES): VIT D 25 HYDROXY: 24.7 ng/mL — AB (ref 30.0–100.0)

## 2017-12-15 LAB — T4, FREE: Free T4: 1.17 ng/dL (ref 0.82–1.77)

## 2017-12-15 LAB — VITAMIN B12: VITAMIN B 12: 756 pg/mL (ref 232–1245)

## 2017-12-15 LAB — FOLATE

## 2017-12-22 ENCOUNTER — Encounter (INDEPENDENT_AMBULATORY_CARE_PROVIDER_SITE_OTHER): Payer: Self-pay | Admitting: Family Medicine

## 2017-12-23 ENCOUNTER — Encounter (INDEPENDENT_AMBULATORY_CARE_PROVIDER_SITE_OTHER): Payer: Self-pay | Admitting: Family Medicine

## 2017-12-28 ENCOUNTER — Ambulatory Visit (INDEPENDENT_AMBULATORY_CARE_PROVIDER_SITE_OTHER): Payer: Managed Care, Other (non HMO) | Admitting: Family Medicine

## 2017-12-28 VITALS — BP 111/78 | HR 66 | Temp 97.9°F | Ht 62.0 in | Wt 248.0 lb

## 2017-12-28 DIAGNOSIS — E559 Vitamin D deficiency, unspecified: Secondary | ICD-10-CM

## 2017-12-28 DIAGNOSIS — Z9189 Other specified personal risk factors, not elsewhere classified: Secondary | ICD-10-CM | POA: Diagnosis not present

## 2017-12-28 DIAGNOSIS — R7303 Prediabetes: Secondary | ICD-10-CM | POA: Diagnosis not present

## 2017-12-28 DIAGNOSIS — Z6841 Body Mass Index (BMI) 40.0 and over, adult: Secondary | ICD-10-CM | POA: Diagnosis not present

## 2017-12-28 MED ORDER — VITAMIN D (ERGOCALCIFEROL) 1.25 MG (50000 UNIT) PO CAPS: 50000.0000 [IU] | ORAL_CAPSULE | ORAL | 0 refills | Status: DC

## 2017-12-29 NOTE — Progress Notes (Signed)
Office: [phone]  /  Fax: [phone]   HPI:   Chief Complaint: OBESITY Autumn Curry is here to discuss her progress with her obesity treatment plan. She is on the Category 2 plan and is following her eating plan approximately 80 % of the time. She states she is doing cardio for 60 minutes 1 time per week. [name] admits cravings for potatoes and fried chicken. Lunch is difficult because her job wanted to supply food. Her weight is 248 lb (112.5 kg) today and has had a weight loss of 5 pounds over a period of 2 weeks since her last visit. She has lost 5 lbs since starting treatment with [place].  Vitamin D deficiency Autumn Curry has a diagnosis of vitamin D deficiency. She is  On OTC multi vitamin. Autumn Curry admits fatigue and denies nausea, vomiting or muscle weakness.  Pre-Diabetes (new) Autumn Curry has a new diagnosis of prediabetes based on her elevated Hgb A1c and was informed this puts her at greater risk of developing diabetes. She is not taking metformin currently and continues to work on diet and exercise to decrease risk of diabetes. She denies nausea or hypoglycemia.  At risk for diabetes Autumn Curry is at higher than average risk for developing diabetes due to her obesity and pre-diabetes. She currently denies polyuria or polydipsia.  ALLERGIES: No Known Allergies  MEDICATIONS: Current Outpatient Medications on File Prior to Visit  Medication Sig Dispense Refill  . aspirin-acetaminophen-caffeine (EXCEDRIN MIGRAINE) 250-250-65 MG tablet Take by mouth every 6 (six) hours as needed for headache.    [name] atenolol (TENORMIN) 25 MG tablet Take 1 tablet (25 mg total) by mouth daily. 90 tablet 1  . DESCOVY 200-25 MG tablet TAKE ONE TABLET BY MOUTH ONCE DAILY WITH OR WITHOUT FOOD. 30 tablet 4  . eletriptan (RELPAX) 20 MG tablet Take 1 tablet (20 mg total) by mouth as needed for migraine or headache. One tablet by mouth at onset of headache. May repeat in 2 hours if headache persists or recurs. 10 tablet  0  . hydrochlorothiazide (HYDRODIURIL) 25 MG tablet TAKE 1 TABLET BY MOUTH ONCE DAILY. MUST HAVE OFFICE VISIT BEFORE FURTHER REFILLS. 90 tablet 1  . Ibuprofen 200 MG CAPS Take by mouth.    . loratadine (CLARITIN) 10 MG tablet Take 10 mg by mouth daily.    . Multiple Vitamins-Minerals (ALIVE WOMENS ENERGY) TABS Take 1 tablet by mouth daily.    . Omega-3-6-9 CAPS Take by mouth daily.    [name] PREZCOBIX 800-150 MG tablet TAKE ONE TABLET BY MOUTH ONCE DAILY WITH FOOD. STORE AT ROOM TEMPERATURE. 30 tablet 4  . valACYclovir (VALTREX) 500 MG tablet Take 1 tablet (500 mg total) by mouth 2 (two) times daily. 10 tablet 5   No current facility-administered medications on file prior to visit.     PAST MEDICAL HISTORY: Past Medical History:  Diagnosis Date  . Absence of menstruation   . Carbuncle and furuncle of unspecified site   . Elevated blood pressure reading without diagnosis of hypertension   . Encounter for long-term (current) use of other medications   . Headache(784.0)   . HIV infection ([place]) 2008   dx AIDS  . Hyperlipidemia   . Hypertension   . Routine general medical examination at a health care facility   . Screening for malignant neoplasm of the cervix   . Syphilis, unspecified     PAST SURGICAL HISTORY: No past surgical history on file.  SOCIAL HISTORY: Social History   Tobacco Use  . Smoking  status: Never Smoker  . Smokeless tobacco: Never Used  Substance Use Topics  . Alcohol use: No    Alcohol/week: 0.0 oz  . Drug use: No    FAMILY HISTORY: Family History  Problem Relation Age of Onset  . Hyperlipidemia Mother   . Hypertension Mother   . Depression Mother   . Coronary artery disease Father   . Diabetes Father   . Heart disease Father   . Hyperlipidemia Father   . Stroke Father   . Kidney disease Father     ROS: Review of Systems  Constitutional: Positive for malaise/fatigue and weight loss.  Gastrointestinal: Negative for nausea and vomiting.    Genitourinary: Negative for frequency.  Musculoskeletal:       Negative for muscle weakness  Endo/Heme/Allergies: Negative for polydipsia.       Negative for hypoglycemia    PHYSICAL EXAM: Blood pressure 111/78, pulse 66, temperature 97.9 F (36.6 C), temperature source Oral, height 5\' 2"  (1.575 m), weight 248 lb (112.5 kg), SpO2 99 %. Body mass index is 45.36 kg/m. Physical Exam  Constitutional: She is oriented to person, place, and time. She appears well-developed and well-nourished.  Cardiovascular: Normal rate.  Pulmonary/Chest: Effort normal.  Musculoskeletal: Normal range of motion.  Neurological: She is oriented to person, place, and time.  Skin: Skin is warm and dry.  Psychiatric: She has a normal mood and affect. Her behavior is normal.  Vitals reviewed.   RECENT LABS AND TESTS: BMET    Component Value Date/Time   NA 135 11/20/2017 1647   K 3.8 11/20/2017 1647   CL 97 (L) 11/20/2017 1647   CO2 31 11/20/2017 1647   GLUCOSE 81 11/20/2017 1647   BUN 9 11/20/2017 1647   CREATININE 0.78 11/20/2017 1647   CALCIUM 9.3 11/20/2017 1647   GFRNONAA 73 06/12/2016 1545   GFRAA 84 06/12/2016 1545   Lab Results  Component Value Date   HGBA1C 5.7 (H) 12/14/2017   HGBA1C 5.6 05/25/2017   Lab Results  Component Value Date   INSULIN 31.4 (H) 12/14/2017   CBC    Component Value Date/Time   WBC 7.2 12/14/2017 1131   WBC 6.6 08/18/2016 1624   RBC 4.28 12/14/2017 1131   RBC 4.07 08/18/2016 1624   HGB 12.5 12/14/2017 1131   HCT 39.3 12/14/2017 1131   PLT 323.0 08/18/2016 1624   MCV 92 12/14/2017 1131   MCH 29.2 12/14/2017 1131   MCH 29.3 06/12/2016 1545   MCHC 31.8 12/14/2017 1131   MCHC 32.9 08/18/2016 1624   RDW 14.0 12/14/2017 1131   LYMPHSABS 3.6 (H) 12/14/2017 1131   MONOABS 348 06/12/2016 1545   EOSABS 0.2 12/14/2017 1131   BASOSABS 0.0 12/14/2017 1131   Iron/TIBC/Ferritin/ %Sat No results found for: IRON, TIBC, FERRITIN, IRONPCTSAT Lipid Panel      Component Value Date/Time   CHOL 214 (H) 11/20/2017 1647   TRIG 150 (H) 11/20/2017 1647   HDL 34 (L) 11/20/2017 1647   CHOLHDL 6.3 (H) 11/20/2017 1647   VLDL 21.2 08/18/2016 1624   LDLCALC 152 (H) 11/20/2017 1647   Hepatic Function Panel     Component Value Date/Time   PROT 7.0 11/20/2017 1647   ALBUMIN 3.9 08/18/2016 1624   AST 15 11/20/2017 1647   ALT 14 11/20/2017 1647   ALKPHOS 72 08/18/2016 1624   BILITOT 0.3 11/20/2017 1647      Component Value Date/Time   TSH 1.710 12/14/2017 1131   TSH 1.420 05/25/2017 0921   TSH  1.96 08/18/2016 1624   Results for JAKYLAH, BASSINGER (MRN [id number]) as of 12/29/2017 10:05  Ref. Range 12/14/2017 11:31  Vitamin D, 25-Hydroxy Latest Ref Range: 30.0 - 100.0 ng/mL 24.7 (L)   ASSESSMENT AND PLAN: Vitamin D deficiency - Plan: Vitamin D, Ergocalciferol, (DRISDOL) 50000 units CAPS capsule  Prediabetes  At risk for diabetes mellitus  Class 3 severe obesity with serious comorbidity and body mass index (BMI) of 45.0 to 49.9 in adult, unspecified obesity type ([place])  PLAN:  Vitamin D Deficiency Paulett was informed that low vitamin D levels contributes to fatigue and are associated with obesity, breast, and colon cancer. She agrees to start prescription Vit D @50 ,000 IU every week #4 with no refills. We will repeat labs in 3 months and she will follow up for routine testing of vitamin D, at least 2-3 times per year. She was informed of the risk of over-replacement of vitamin D and agrees to not increase her dose unless she discusses this with [place] first.  Pre-Diabetes (new) Rhylei will continue to work on weight loss, exercise, and decreasing simple carbohydrates in her diet to help decrease the risk of diabetes. We dicussed metformin including benefits and risks. She was informed that eating too many simple carbohydrates or too many calories at one sitting increases the likelihood of GI side effects. Aliyyah will continue with the category 2 plan.  We will repeat Hgb A1c and insulin in 3 months and she agreed to follow up with [place] as directed to monitor her progress.  Diabetes risk counseling Edythe was given extended (30 minutes) diabetes prevention counseling today. She is 37 y.o. female and has risk factors for diabetes including obesity and pre-diabetes. We discussed intensive lifestyle modifications today with an emphasis on weight loss as well as increasing exercise and decreasing simple carbohydrates in her diet.  Obesity Lake is currently in the action stage of change. As such, her goal is to continue with weight loss efforts She has agreed to follow the Category 2 plan Siani has been instructed to work up to a goal of 150 minutes of combined cardio and strengthening exercise per week for weight loss and overall health benefits. We discussed the following Behavioral Modification Strategies today: planning for success, increasing lean protein intake, increasing vegetables, work on meal planning and easy cooking plans and ways to avoid night time snacking  Tonita has agreed to follow up with our clinic in 2 weeks. She was informed of the importance of frequent follow up visits to maximize her success with intensive lifestyle modifications for her multiple health conditions.   OBESITY BEHAVIORAL INTERVENTION VISIT  Today's visit was # 2 out of 22.  Starting weight: 253 lbs Starting date: 12/14/17 Today's weight : 248 lbs Today's date: 12/28/2017 Total lbs lost to date: 5 (Patients must lose 7 lbs in the first 6 months to continue with counseling)   ASK: We discussed the diagnosis of obesity with Geryl Councilman today and Mckinleigh agreed to give [place] permission to discuss obesity behavioral modification therapy today.  ASSESS: Raivyn has the diagnosis of obesity and her BMI today is 45.35 Imanie is in the action stage of change   ADVISE: Mirabella was educated on the multiple health risks of obesity as well as the  benefit of weight loss to improve her health. She was advised of the need for long term treatment and the importance of lifestyle modifications.  AGREE: Multiple dietary modification options and treatment options were discussed and  Cally  agreed to the above obesity treatment plan.  I, Doreene Nest, am acting as transcriptionist for Eber Jones, MD  I have reviewed the above documentation for accuracy and completeness, and I agree with the above. - Ilene Qua, MD

## 2018-01-11 ENCOUNTER — Ambulatory Visit (INDEPENDENT_AMBULATORY_CARE_PROVIDER_SITE_OTHER): Payer: Managed Care, Other (non HMO) | Admitting: Family Medicine

## 2018-01-11 VITALS — BP 122/72 | HR 81 | Temp 98.5°F | Ht 62.0 in | Wt 245.0 lb

## 2018-01-11 DIAGNOSIS — R7303 Prediabetes: Secondary | ICD-10-CM

## 2018-01-11 DIAGNOSIS — E559 Vitamin D deficiency, unspecified: Secondary | ICD-10-CM | POA: Diagnosis not present

## 2018-01-11 DIAGNOSIS — Z6841 Body Mass Index (BMI) 40.0 and over, adult: Secondary | ICD-10-CM | POA: Diagnosis not present

## 2018-01-11 DIAGNOSIS — Z9189 Other specified personal risk factors, not elsewhere classified: Secondary | ICD-10-CM

## 2018-01-11 MED ORDER — VITAMIN D (ERGOCALCIFEROL) 1.25 MG (50000 UNIT) PO CAPS: 50000.0000 [IU] | ORAL_CAPSULE | ORAL | 0 refills | Status: DC

## 2018-01-12 NOTE — Progress Notes (Signed)
Office: [phone]  /  Fax: [phone]   HPI:   Chief Complaint: OBESITY Roan is here to discuss her progress with her obesity treatment plan. She is on the Category 2 plan and is following her eating plan approximately 80 % of the time. She states she is exercising 0 minutes 0 times per week. Lurlie's 1st week after last appointment had Bible camp and was eating on then run and 2nd week got back on track. No hunger.  Her weight is 245 lb (111.1 kg) today and has had a weight loss of 3 pounds over a period of 2 weeks since her last visit. She has lost 8 lbs since starting treatment with [place].  Vitamin D Deficiency Chrislyn has a diagnosis of vitamin D deficiency. She is currently taking prescription Vit D. She notes fatigue and denies nausea, vomiting or muscle weakness.  At risk for osteopenia and osteoporosis Karstyn is at higher risk of osteopenia and osteoporosis due to vitamin D deficiency.   Pre-Diabetes Citlally has a diagnosis of pre-diabetes based on her elevated Hgb A1c and was informed this puts her at greater risk of developing diabetes. She is not taking metformin currently and continues to work on diet and exercise to decrease risk of diabetes. She denies carbohydrate cravings or hypoglycemia.  ALLERGIES: No Known Allergies  MEDICATIONS: Current Outpatient Medications on File Prior to Visit  Medication Sig Dispense Refill  . aspirin-acetaminophen-caffeine (EXCEDRIN MIGRAINE) 250-250-65 MG tablet Take by mouth every 6 (six) hours as needed for headache.    [name] atenolol (TENORMIN) 25 MG tablet Take 1 tablet (25 mg total) by mouth daily. 90 tablet 1  . DESCOVY 200-25 MG tablet TAKE ONE TABLET BY MOUTH ONCE DAILY WITH OR WITHOUT FOOD. 30 tablet 4  . eletriptan (RELPAX) 20 MG tablet Take 1 tablet (20 mg total) by mouth as needed for migraine or headache. One tablet by mouth at onset of headache. May repeat in 2 hours if headache persists or recurs. 10 tablet 0  .  hydrochlorothiazide (HYDRODIURIL) 25 MG tablet TAKE 1 TABLET BY MOUTH ONCE DAILY. MUST HAVE OFFICE VISIT BEFORE FURTHER REFILLS. 90 tablet 1  . Ibuprofen 200 MG CAPS Take by mouth.    . loratadine (CLARITIN) 10 MG tablet Take 10 mg by mouth daily.    . Multiple Vitamins-Minerals (ALIVE WOMENS ENERGY) TABS Take 1 tablet by mouth daily.    . Omega-3-6-9 CAPS Take by mouth daily.    [name] PREZCOBIX 800-150 MG tablet TAKE ONE TABLET BY MOUTH ONCE DAILY WITH FOOD. STORE AT ROOM TEMPERATURE. 30 tablet 4  . valACYclovir (VALTREX) 500 MG tablet Take 1 tablet (500 mg total) by mouth 2 (two) times daily. 10 tablet 5   No current facility-administered medications on file prior to visit.     PAST MEDICAL HISTORY: Past Medical History:  Diagnosis Date  . Absence of menstruation   . Carbuncle and furuncle of unspecified site   . Elevated blood pressure reading without diagnosis of hypertension   . Encounter for long-term (current) use of other medications   . Headache(784.0)   . HIV infection ([place]) 2008   dx AIDS  . Hyperlipidemia   . Hypertension   . Routine general medical examination at a health care facility   . Screening for malignant neoplasm of the cervix   . Syphilis, unspecified     PAST SURGICAL HISTORY: No past surgical history on file.  SOCIAL HISTORY: Social History   Tobacco Use  . Smoking status:  Never Smoker  . Smokeless tobacco: Never Used  Substance Use Topics  . Alcohol use: No    Alcohol/week: 0.0 oz  . Drug use: No    FAMILY HISTORY: Family History  Problem Relation Age of Onset  . Hyperlipidemia Mother   . Hypertension Mother   . Depression Mother   . Coronary artery disease Father   . Diabetes Father   . Heart disease Father   . Hyperlipidemia Father   . Stroke Father   . Kidney disease Father     ROS: Review of Systems  Constitutional: Positive for malaise/fatigue and weight loss.  Gastrointestinal: Negative for nausea and vomiting.  Musculoskeletal:        Negative muscle weakness  Endo/Heme/Allergies:       Negative hypoglycemia    PHYSICAL EXAM: Blood pressure 122/72, pulse 81, temperature 98.5 F (36.9 C), temperature source Oral, height 5\' 2"  (1.575 m), weight 245 lb (111.1 kg), SpO2 98 %. Body mass index is 44.81 kg/m. Physical Exam  Constitutional: She is oriented to person, place, and time. She appears well-developed and well-nourished.  Cardiovascular: Normal rate.  Pulmonary/Chest: Effort normal.  Musculoskeletal: Normal range of motion.  Neurological: She is oriented to person, place, and time.  Skin: Skin is warm and dry.  Psychiatric: She has a normal mood and affect. Her behavior is normal.  Vitals reviewed.   RECENT LABS AND TESTS: BMET    Component Value Date/Time   NA 135 11/20/2017 1647   K 3.8 11/20/2017 1647   CL 97 (L) 11/20/2017 1647   CO2 31 11/20/2017 1647   GLUCOSE 81 11/20/2017 1647   BUN 9 11/20/2017 1647   CREATININE 0.78 11/20/2017 1647   CALCIUM 9.3 11/20/2017 1647   GFRNONAA 73 06/12/2016 1545   GFRAA 84 06/12/2016 1545   Lab Results  Component Value Date   HGBA1C 5.7 (H) 12/14/2017   HGBA1C 5.6 05/25/2017   Lab Results  Component Value Date   INSULIN 31.4 (H) 12/14/2017   CBC    Component Value Date/Time   WBC 7.2 12/14/2017 1131   WBC 6.6 08/18/2016 1624   RBC 4.28 12/14/2017 1131   RBC 4.07 08/18/2016 1624   HGB 12.5 12/14/2017 1131   HCT 39.3 12/14/2017 1131   PLT 323.0 08/18/2016 1624   MCV 92 12/14/2017 1131   MCH 29.2 12/14/2017 1131   MCH 29.3 06/12/2016 1545   MCHC 31.8 12/14/2017 1131   MCHC 32.9 08/18/2016 1624   RDW 14.0 12/14/2017 1131   LYMPHSABS 3.6 (H) 12/14/2017 1131   MONOABS 348 06/12/2016 1545   EOSABS 0.2 12/14/2017 1131   BASOSABS 0.0 12/14/2017 1131   Iron/TIBC/Ferritin/ %Sat No results found for: IRON, TIBC, FERRITIN, IRONPCTSAT Lipid Panel     Component Value Date/Time   CHOL 214 (H) 11/20/2017 1647   TRIG 150 (H) 11/20/2017 1647   HDL  34 (L) 11/20/2017 1647   CHOLHDL 6.3 (H) 11/20/2017 1647   VLDL 21.2 08/18/2016 1624   LDLCALC 152 (H) 11/20/2017 1647   Hepatic Function Panel     Component Value Date/Time   PROT 7.0 11/20/2017 1647   ALBUMIN 3.9 08/18/2016 1624   AST 15 11/20/2017 1647   ALT 14 11/20/2017 1647   ALKPHOS 72 08/18/2016 1624   BILITOT 0.3 11/20/2017 1647      Component Value Date/Time   TSH 1.710 12/14/2017 1131   TSH 1.420 05/25/2017 0921   TSH 1.96 08/18/2016 1624  Results for NORMAGENE, HARVIE (MRN [id number]) as  of 01/12/2018 10:56  Ref. Range 12/14/2017 11:31  Vitamin D, 25-Hydroxy Latest Ref Range: 30.0 - 100.0 ng/mL 24.7 (L)    ASSESSMENT AND PLAN: Vitamin D deficiency - Plan: Vitamin D, Ergocalciferol, (DRISDOL) 50000 units CAPS capsule  Prediabetes  At risk for osteoporosis  Class 3 severe obesity with serious comorbidity and body mass index (BMI) of 40.0 to 44.9 in adult, unspecified obesity type ([place])  PLAN:  Vitamin D Deficiency Ralyn was informed that low vitamin D levels contributes to fatigue and are associated with obesity, breast, and colon cancer. Corlette agrees to continue taking prescription Vit D @50 ,000 IU every week #4 and we will refill for 1 month. She will follow up for routine testing of vitamin D, at least 2-3 times per year. She was informed of the risk of over-replacement of vitamin D and agrees to not increase her dose unless she discusses this with [place] first. Aolanis agrees to follow up with our clinic in 2 weeks.  At risk for osteopenia and osteoporosis Kamya is at risk for osteopenia and osteoporsis due to her vitamin D deficiency. She was encouraged to take her vitamin D and follow her higher calcium diet and increase strengthening exercise to help strengthen her bones and decrease her risk of osteopenia and osteoporosis.  Pre-Diabetes Niasha will continue Category 2 plan and will continue to work on weight loss, exercise, and decreasing simple  carbohydrates in her diet to help decrease the risk of diabetes. We dicussed metformin including benefits and risks. She was informed that eating too many simple carbohydrates or too many calories at one sitting increases the likelihood of GI side effects. Fifi declined metformin for now and a prescription was not written today. Shaneika agrees to follow up with our clinic in 2 weeks as directed to monitor her progress.  Obesity Lindsea is currently in the action stage of change. As such, her goal is to continue with weight loss efforts She has agreed to follow the Category 2 plan Lekha has been instructed to work up to a goal of 150 minutes of combined cardio and strengthening exercise per week for weight loss and overall health benefits. We discussed the following Behavioral Modification Strategies today: increasing lean protein intake, increasing vegetables, work on meal planning and easy cooking plans, better snacking choices, and planning for success   Astoria has agreed to follow up with our clinic in 2 weeks. She was informed of the importance of frequent follow up visits to maximize her success with intensive lifestyle modifications for her multiple health conditions.   OBESITY BEHAVIORAL INTERVENTION VISIT  Today's visit was # 3 out of 22.  Starting weight: 253 lbs Starting date: 12/14/17 Today's weight : 245 lbs  Today's date: 01/11/2018 Total lbs lost to date: 8 (Patients must lose 7 lbs in the first 6 months to continue with counseling)   ASK: We discussed the diagnosis of obesity with Geryl Councilman today and Danee agreed to give [place] permission to discuss obesity behavioral modification therapy today.  ASSESS: Gracelynne has the diagnosis of obesity and her BMI today is 42.8 Analyah is in the action stage of change   ADVISE: Dayannara was educated on the multiple health risks of obesity as well as the benefit of weight loss to improve her health. She was advised of the  need for long term treatment and the importance of lifestyle modifications.  AGREE: Multiple dietary modification options and treatment options were discussed and  Brianna agreed to the above obesity  treatment plan.  I, Trixie Dredge, am acting as transcriptionist for Ilene Qua, MD  I have reviewed the above documentation for accuracy and completeness, and I agree with the above. - Ilene Qua, MD

## 2018-01-22 ENCOUNTER — Telehealth: Payer: Self-pay

## 2018-01-22 ENCOUNTER — Encounter: Payer: Self-pay | Admitting: Family Medicine

## 2018-01-22 ENCOUNTER — Encounter (INDEPENDENT_AMBULATORY_CARE_PROVIDER_SITE_OTHER): Payer: Self-pay | Admitting: Family Medicine

## 2018-01-22 NOTE — Telephone Encounter (Signed)
Pt requesting "water pill' refill. Author stated that pt. Needs OV before further refills can be given for the HCTZ per Dr. Etter Sjogren. Pt agreeable; appointment made for 7/16 at [facility].

## 2018-01-26 ENCOUNTER — Ambulatory Visit: Payer: Managed Care, Other (non HMO) | Admitting: Family Medicine

## 2018-01-26 ENCOUNTER — Encounter: Payer: Self-pay | Admitting: Family Medicine

## 2018-01-26 VITALS — BP 128/79 | HR 65 | Temp 98.2°F | Resp 16 | Ht 62.0 in | Wt 251.0 lb

## 2018-01-26 DIAGNOSIS — I10 Essential (primary) hypertension: Secondary | ICD-10-CM

## 2018-01-26 DIAGNOSIS — E785 Hyperlipidemia, unspecified: Secondary | ICD-10-CM | POA: Diagnosis not present

## 2018-01-26 MED ORDER — HYDROCHLOROTHIAZIDE 25 MG PO TABS: ORAL_TABLET | ORAL | 1 refills | Status: DC

## 2018-01-26 NOTE — Patient Instructions (Signed)

## 2018-01-26 NOTE — Assessment & Plan Note (Signed)
con't with healthy weight and wellness

## 2018-01-26 NOTE — Assessment & Plan Note (Signed)
Encouraged heart healthy diet, increase exercise, avoid trans fats, consider a krill oil cap daily 

## 2018-01-26 NOTE — Assessment & Plan Note (Signed)
Well controlled, no changes to meds. Encouraged heart healthy diet such as the DASH diet and exercise as tolerated.  °

## 2018-01-26 NOTE — Progress Notes (Signed)
Patient ID: Autumn Curry, female    DOB: Dec 12, 1980  Age: 37 y.o. MRN: [id number]    Subjective:  Subjective  HPI [name] presents for f/u bp and labs.  No complaints  Review of Systems  Constitutional: Negative for appetite change, diaphoresis, fatigue and unexpected weight change.  Eyes: Negative for pain, redness and visual disturbance.  Respiratory: Negative for cough, chest tightness, shortness of breath and wheezing.   Cardiovascular: Negative for chest pain, palpitations and leg swelling.  Endocrine: Negative for cold intolerance, heat intolerance, polydipsia, polyphagia and polyuria.  Genitourinary: Negative for difficulty urinating, dysuria and frequency.  Neurological: Negative for dizziness, light-headedness, numbness and headaches.    History Past Medical History:  Diagnosis Date  . Absence of menstruation   . Carbuncle and furuncle of unspecified site   . Elevated blood pressure reading without diagnosis of hypertension   . Encounter for long-term (current) use of other medications   . Headache(784.0)   . HIV infection ([place]) 2008   dx AIDS  . Hyperlipidemia   . Hypertension   . Routine general medical examination at a health care facility   . Screening for malignant neoplasm of the cervix   . Syphilis, unspecified     She has no past surgical history on file.   Her family history includes Coronary artery disease in her father; Depression in her mother; Diabetes in her father; Heart disease in her father; Hyperlipidemia in her father and mother; Hypertension in her mother; Kidney disease in her father; Stroke in her father.She reports that she has never smoked. She has never used smokeless tobacco. She reports that she does not drink alcohol or use drugs.  Current Outpatient Medications on File Prior to Visit  Medication Sig Dispense Refill  . aspirin-acetaminophen-caffeine (EXCEDRIN MIGRAINE) 250-250-65 MG tablet Take by mouth every 6 (six) hours as  needed for headache.    [name] atenolol (TENORMIN) 25 MG tablet Take 1 tablet (25 mg total) by mouth daily. 90 tablet 1  . DESCOVY 200-25 MG tablet TAKE ONE TABLET BY MOUTH ONCE DAILY WITH OR WITHOUT FOOD. 30 tablet 4  . eletriptan (RELPAX) 20 MG tablet Take 1 tablet (20 mg total) by mouth as needed for migraine or headache. One tablet by mouth at onset of headache. May repeat in 2 hours if headache persists or recurs. 10 tablet 0  . Ibuprofen 200 MG CAPS Take by mouth.    . loratadine (CLARITIN) 10 MG tablet Take 10 mg by mouth daily.    . Multiple Vitamins-Minerals (ALIVE WOMENS ENERGY) TABS Take 1 tablet by mouth daily.    . Omega-3-6-9 CAPS Take by mouth daily.    [name] PREZCOBIX 800-150 MG tablet TAKE ONE TABLET BY MOUTH ONCE DAILY WITH FOOD. STORE AT ROOM TEMPERATURE. 30 tablet 4  . valACYclovir (VALTREX) 500 MG tablet Take 1 tablet (500 mg total) by mouth 2 (two) times daily. 10 tablet 5  . Vitamin D, Ergocalciferol, (DRISDOL) 50000 units CAPS capsule Take 1 capsule (50,000 Units total) by mouth every 7 (seven) days. 4 capsule 0   No current facility-administered medications on file prior to visit.      Objective:  Objective  Physical Exam  Constitutional: She is oriented to person, place, and time. She appears well-developed and well-nourished.  HENT:  Head: Normocephalic and atraumatic.  Eyes: Conjunctivae and EOM are normal.  Neck: Normal range of motion. Neck supple. No JVD present. Carotid bruit is not present. No thyromegaly present.  Cardiovascular: Normal  rate, regular rhythm and normal heart sounds.  No murmur heard. Pulmonary/Chest: Effort normal and breath sounds normal. No respiratory distress. She has no wheezes. She has no rales. She exhibits no tenderness.  Musculoskeletal: She exhibits no edema.  Neurological: She is alert and oriented to person, place, and time.  Psychiatric: She has a normal mood and affect.  Nursing note and vitals reviewed.  BP 128/79 (BP Location:  Left Arm, Cuff Size: Large)   Pulse 65   Temp 98.2 F (36.8 C) (Oral)   Resp 16   Ht 5\' 2"  (1.575 m)   Wt 251 lb (113.9 kg)   LMP 01/12/2018   SpO2 99%   BMI 45.91 kg/m  Wt Readings from Last 3 Encounters:  01/26/18 251 lb (113.9 kg)  01/11/18 245 lb (111.1 kg)  12/28/17 248 lb (112.5 kg)     Lab Results  Component Value Date   WBC 7.2 12/14/2017   HGB 12.5 12/14/2017   HCT 39.3 12/14/2017   PLT 323.0 08/18/2016   GLUCOSE 81 11/20/2017   CHOL 214 (H) 11/20/2017   TRIG 150 (H) 11/20/2017   HDL 34 (L) 11/20/2017   LDLCALC 152 (H) 11/20/2017   ALT 14 11/20/2017   AST 15 11/20/2017   NA 135 11/20/2017   K 3.8 11/20/2017   CL 97 (L) 11/20/2017   CREATININE 0.78 11/20/2017   BUN 9 11/20/2017   CO2 31 11/20/2017   TSH 1.710 12/14/2017   HGBA1C 5.7 (H) 12/14/2017    US Transvaginal Non-ob  Result Date: 05/26/2017 CLINICAL DATA:  Irregular cycles EXAM: TRANSABDOMINAL AND TRANSVAGINAL ULTRASOUND OF PELVIS TECHNIQUE: Both transabdominal and transvaginal ultrasound examinations of the pelvis were performed. Transabdominal technique was performed for global imaging of the pelvis including uterus, ovaries, adnexal regions, and pelvic cul-de-sac. It was necessary to proceed with endovaginal exam following the transabdominal exam to visualize the uterus, endometrium, ovaries and adnexa . COMPARISON:  CT 04/03/2003 FINDINGS: Uterus Measurements: 6.1 x 2.4 x 4.4 cm. Large posterior subserosal/pedunculated fundal fibroid measures 9.5 x 8.3 x 8.0 cm Endometrium Thickness: 3 mm in thickness.  No focal abnormality. Right ovary Measurements: Not well visualized due to the large right pelvic fibroid. Left ovary Measurements: 2.1 x 1.2 x 1.5 cm. Normal appearance/no adnexal mass. Other findings No abnormal free fluid. IMPRESSION: Large posterior fundal subserosal/ pedunculated fibroid measuring up to 9.5 cm. Electronically Signed   By: Rolm Baptise M.D.   On: 05/26/2017 08:05   US Pelvis  Complete  Result Date: 05/26/2017 CLINICAL DATA:  Irregular cycles EXAM: TRANSABDOMINAL AND TRANSVAGINAL ULTRASOUND OF PELVIS TECHNIQUE: Both transabdominal and transvaginal ultrasound examinations of the pelvis were performed. Transabdominal technique was performed for global imaging of the pelvis including uterus, ovaries, adnexal regions, and pelvic cul-de-sac. It was necessary to proceed with endovaginal exam following the transabdominal exam to visualize the uterus, endometrium, ovaries and adnexa . COMPARISON:  CT 04/03/2003 FINDINGS: Uterus Measurements: 6.1 x 2.4 x 4.4 cm. Large posterior subserosal/pedunculated fundal fibroid measures 9.5 x 8.3 x 8.0 cm Endometrium Thickness: 3 mm in thickness.  No focal abnormality. Right ovary Measurements: Not well visualized due to the large right pelvic fibroid. Left ovary Measurements: 2.1 x 1.2 x 1.5 cm. Normal appearance/no adnexal mass. Other findings No abnormal free fluid. IMPRESSION: Large posterior fundal subserosal/ pedunculated fibroid measuring up to 9.5 cm. Electronically Signed   By: Rolm Baptise M.D.   On: 05/26/2017 08:05     Assessment & Plan:  Plan  I have  changed Sallee Lange. Teaney's hydrochlorothiazide. I am also having her maintain her ALIVE WOMENS ENERGY, eletriptan, valACYclovir, PREZCOBIX, DESCOVY, atenolol, loratadine, Omega-3-6-9, aspirin-acetaminophen-caffeine, Ibuprofen, and Vitamin D (Ergocalciferol).  Meds ordered this encounter  Medications  . hydrochlorothiazide (HYDRODIURIL) 25 MG tablet    Sig: TAKE 1 TABLET BY MOUTH ONCE DAILY    Dispense:  90 tablet    Refill:  1    Please consider 90 day supplies to promote better adherence    Problem List Items Addressed This Visit      Unprioritized   Hyperlipidemia - Primary    Encouraged heart healthy diet, increase exercise, avoid trans fats, consider a krill oil cap daily      Relevant Medications   hydrochlorothiazide (HYDRODIURIL) 25 MG tablet   Other Relevant  Orders   Lipid panel   Comprehensive metabolic panel   Hypertension    Well controlled, no changes to meds. Encouraged heart healthy diet such as the DASH diet and exercise as tolerated.       Relevant Medications   hydrochlorothiazide (HYDRODIURIL) 25 MG tablet   Other Relevant Orders   Lipid panel   Comprehensive metabolic panel      Follow-up: No follow-ups on file.  Ann Held, DO

## 2018-01-27 LAB — LIPID PANEL
CHOL/HDL RATIO: 6
Cholesterol: 208 mg/dL — ABNORMAL HIGH (ref 0–200)
HDL: 35 mg/dL — AB (ref >39.00)
LDL CALC: 148 mg/dL — AB (ref 0–99)
NONHDL: 173.47
TRIGLYCERIDES: 128 mg/dL (ref 0.0–149.0)
VLDL: 25.6 mg/dL (ref 0.0–40.0)

## 2018-01-27 LAB — COMPREHENSIVE METABOLIC PANEL
ALT: 16 U/L (ref 0–35)
AST: 14 U/L (ref 0–37)
Albumin: 3.9 g/dL (ref 3.5–5.2)
Alkaline Phosphatase: 60 U/L (ref 39–117)
BUN: 18 mg/dL (ref 6–23)
CALCIUM: 9.2 mg/dL (ref 8.4–10.5)
CHLORIDE: 104 meq/L (ref 96–112)
CO2: 28 meq/L (ref 19–32)
Creatinine, Ser: 1.03 mg/dL (ref 0.40–1.20)
GFR: 77.19 mL/min (ref >60.00)
GLUCOSE: 84 mg/dL (ref 70–99)
POTASSIUM: 3.7 meq/L (ref 3.5–5.1)
Sodium: 139 mEq/L (ref 135–145)
Total Bilirubin: 0.3 mg/dL (ref 0.2–1.2)
Total Protein: 7.6 g/dL (ref 6.0–8.3)

## 2018-02-01 MED ORDER — ATORVASTATIN CALCIUM 10 MG PO TABS: 10.0000 mg | ORAL_TABLET | Freq: Every day | ORAL | 2 refills | Status: DC

## 2018-02-01 NOTE — Addendum Note (Signed)
Addended byDamita Dunnings D on: 02/01/2018 11:56 AM   Modules accepted: Orders

## 2018-02-03 ENCOUNTER — Ambulatory Visit (INDEPENDENT_AMBULATORY_CARE_PROVIDER_SITE_OTHER): Payer: Managed Care, Other (non HMO) | Admitting: Family Medicine

## 2018-02-03 VITALS — BP 117/84 | HR 64 | Temp 98.0°F | Ht 62.0 in | Wt 244.0 lb

## 2018-02-03 DIAGNOSIS — E559 Vitamin D deficiency, unspecified: Secondary | ICD-10-CM

## 2018-02-03 DIAGNOSIS — Z6841 Body Mass Index (BMI) 40.0 and over, adult: Secondary | ICD-10-CM

## 2018-02-03 DIAGNOSIS — I10 Essential (primary) hypertension: Secondary | ICD-10-CM | POA: Diagnosis not present

## 2018-02-03 NOTE — Progress Notes (Signed)
Office: [phone]  /  Fax: [phone]   HPI:   Chief Complaint: OBESITY Autumn Curry is here to discuss her progress with her obesity treatment plan. She is on the Category 2 plan and is following her eating plan approximately 80 % of the time. She states she is exercising 0 minutes 0 times per week. [name] found it somewhat difficult following plan while in [place], tried to make good choices. She denies cravings and still liking the plan.  Her weight is 244 lb (110.7 kg) today and has had a weight loss of 1 pound over a period of 3 weeks since her last visit. She has lost 9 lbs since starting treatment with [place].  Hypertension Autumn Curry is a 37 y.o. female with hypertension. Autumn Curry's blood pressure is controlled today. She denies dizziness or lightheadedness. She is working weight loss to help control her blood pressure with the goal of decreasing her risk of heart attack and stroke.   Vitamin D Deficiency Autumn Curry has a diagnosis of vitamin D deficiency. She is currently taking prescription Vit D. She notes fatigue and denies nausea, vomiting or muscle weakness.  ALLERGIES: No Known Allergies  MEDICATIONS: Current Outpatient Medications on File Prior to Visit  Medication Sig Dispense Refill  . aspirin-acetaminophen-caffeine (EXCEDRIN MIGRAINE) 250-250-65 MG tablet Take by mouth every 6 (six) hours as needed for headache.    [name] atenolol (TENORMIN) 25 MG tablet Take 1 tablet (25 mg total) by mouth daily. 90 tablet 1  . atorvastatin (LIPITOR) 10 MG tablet Take 1 tablet (10 mg total) by mouth at bedtime. 30 tablet 2  . DESCOVY 200-25 MG tablet TAKE ONE TABLET BY MOUTH ONCE DAILY WITH OR WITHOUT FOOD. 30 tablet 4  . eletriptan (RELPAX) 20 MG tablet Take 1 tablet (20 mg total) by mouth as needed for migraine or headache. One tablet by mouth at onset of headache. May repeat in 2 hours if headache persists or recurs. 10 tablet 0  . hydrochlorothiazide (HYDRODIURIL) 25 MG tablet TAKE 1  TABLET BY MOUTH ONCE DAILY 90 tablet 1  . Ibuprofen 200 MG CAPS Take by mouth.    . loratadine (CLARITIN) 10 MG tablet Take 10 mg by mouth daily.    . Multiple Vitamins-Minerals (ALIVE WOMENS ENERGY) TABS Take 1 tablet by mouth daily.    . Omega-3-6-9 CAPS Take by mouth daily.    [name] PREZCOBIX 800-150 MG tablet TAKE ONE TABLET BY MOUTH ONCE DAILY WITH FOOD. STORE AT ROOM TEMPERATURE. 30 tablet 4  . valACYclovir (VALTREX) 500 MG tablet Take 1 tablet (500 mg total) by mouth 2 (two) times daily. 10 tablet 5  . Vitamin D, Ergocalciferol, (DRISDOL) 50000 units CAPS capsule Take 1 capsule (50,000 Units total) by mouth every 7 (seven) days. 4 capsule 0   No current facility-administered medications on file prior to visit.     PAST MEDICAL HISTORY: Past Medical History:  Diagnosis Date  . Absence of menstruation   . Carbuncle and furuncle of unspecified site   . Elevated blood pressure reading without diagnosis of hypertension   . Encounter for long-term (current) use of other medications   . Headache(784.0)   . HIV infection ([place]) 2008   dx AIDS  . Hyperlipidemia   . Hypertension   . Routine general medical examination at a health care facility   . Screening for malignant neoplasm of the cervix   . Syphilis, unspecified     PAST SURGICAL HISTORY: No past surgical history on file.  SOCIAL  HISTORY: Social History   Tobacco Use  . Smoking status: Never Smoker  . Smokeless tobacco: Never Used  Substance Use Topics  . Alcohol use: No    Alcohol/week: 0.0 oz  . Drug use: No    FAMILY HISTORY: Family History  Problem Relation Age of Onset  . Hyperlipidemia Mother   . Hypertension Mother   . Depression Mother   . Coronary artery disease Father   . Diabetes Father   . Heart disease Father   . Hyperlipidemia Father   . Stroke Father   . Kidney disease Father     ROS: Review of Systems  Constitutional: Positive for malaise/fatigue and weight loss.  Gastrointestinal: Negative  for nausea and vomiting.  Musculoskeletal:       Negative muscle weakness  Neurological: Negative for dizziness.       Negative lightheadedness    PHYSICAL EXAM: Blood pressure 117/84, pulse 64, temperature 98 F (36.7 C), temperature source Oral, height 5\' 2"  (1.575 m), weight 244 lb (110.7 kg), last menstrual period 01/12/2018, SpO2 100 %. Body mass index is 44.63 kg/m. Physical Exam  Constitutional: She is oriented to person, place, and time. She appears well-developed and well-nourished.  Cardiovascular: Normal rate.  Pulmonary/Chest: Effort normal.  Musculoskeletal: Normal range of motion.  Neurological: She is oriented to person, place, and time.  Skin: Skin is warm and dry.  Psychiatric: She has a normal mood and affect. Her behavior is normal.  Vitals reviewed.   RECENT LABS AND TESTS: BMET    Component Value Date/Time   NA 139 01/26/2018 1814   K 3.7 01/26/2018 1814   CL 104 01/26/2018 1814   CO2 28 01/26/2018 1814   GLUCOSE 84 01/26/2018 1814   BUN 18 01/26/2018 1814   CREATININE 1.03 01/26/2018 1814   CREATININE 0.78 11/20/2017 1647   CALCIUM 9.2 01/26/2018 1814   GFRNONAA 73 06/12/2016 1545   GFRAA 84 06/12/2016 1545   Lab Results  Component Value Date   HGBA1C 5.7 (H) 12/14/2017   HGBA1C 5.6 05/25/2017   Lab Results  Component Value Date   INSULIN 31.4 (H) 12/14/2017   CBC    Component Value Date/Time   WBC 7.2 12/14/2017 1131   WBC 6.6 08/18/2016 1624   RBC 4.28 12/14/2017 1131   RBC 4.07 08/18/2016 1624   HGB 12.5 12/14/2017 1131   HCT 39.3 12/14/2017 1131   PLT 323.0 08/18/2016 1624   MCV 92 12/14/2017 1131   MCH 29.2 12/14/2017 1131   MCH 29.3 06/12/2016 1545   MCHC 31.8 12/14/2017 1131   MCHC 32.9 08/18/2016 1624   RDW 14.0 12/14/2017 1131   LYMPHSABS 3.6 (H) 12/14/2017 1131   MONOABS 348 06/12/2016 1545   EOSABS 0.2 12/14/2017 1131   BASOSABS 0.0 12/14/2017 1131   Iron/TIBC/Ferritin/ %Sat No results found for: IRON, TIBC,  FERRITIN, IRONPCTSAT Lipid Panel     Component Value Date/Time   CHOL 208 (H) 01/26/2018 1814   TRIG 128.0 01/26/2018 1814   HDL 35.00 (L) 01/26/2018 1814   CHOLHDL 6 01/26/2018 1814   VLDL 25.6 01/26/2018 1814   LDLCALC 148 (H) 01/26/2018 1814   LDLCALC 152 (H) 11/20/2017 1647   Hepatic Function Panel     Component Value Date/Time   PROT 7.6 01/26/2018 1814   ALBUMIN 3.9 01/26/2018 1814   AST 14 01/26/2018 1814   ALT 16 01/26/2018 1814   ALKPHOS 60 01/26/2018 1814   BILITOT 0.3 01/26/2018 1814      Component Value  Date/Time   TSH 1.710 12/14/2017 1131   TSH 1.420 05/25/2017 0921   TSH 1.96 08/18/2016 1624  Results for CORRA, KAINE (MRN [id number]) as of 02/03/2018 09:51  Ref. Range 12/14/2017 11:31  Vitamin D, 25-Hydroxy Latest Ref Range: 30.0 - 100.0 ng/mL 24.7 (L)    ASSESSMENT AND PLAN: Essential hypertension  Vitamin D deficiency  Class 3 severe obesity with serious comorbidity and body mass index (BMI) of 40.0 to 44.9 in adult, unspecified obesity type ([place])  PLAN:  Hypertension We discussed sodium restriction, working on healthy weight loss, and a regular exercise program as the means to achieve improved blood pressure control. Autumn Curry agreed with this plan and agreed to follow up as directed. We will continue to monitor her blood pressure as well as her progress with the above lifestyle modifications. We will follow up on blood pressure at next appointment. She will continue her medications and will watch for signs of hypotension as she continues her lifestyle modifications. Autumn Curry agrees to follow up with our clinic in 2 weeks.  Vitamin D Deficiency Autumn Curry was informed that low vitamin D levels contributes to fatigue and are associated with obesity, breast, and colon cancer. Autumn Curry agrees to continue taking prescription Vit D @50 ,000 IU every week and will follow up for routine testing of vitamin D, at least 2-3 times per year. She was informed of the risk of  over-replacement of vitamin D and agrees to not increase her dose unless she discusses this with [place] first. Autumn Curry agrees to follow up with our clinic in 2 weeks.  We spent > than 50% of the 15 minute visit on the counseling as documented in the note.  Obesity Autumn Curry is currently in the action stage of change. As such, her goal is to continue with weight loss efforts She has agreed to follow the Category 2 plan Autumn Curry has been instructed to work up to a goal of 150 minutes of combined cardio and strengthening exercise per week or start zumba or cardio dance 2 time per week for weight loss and overall health benefits. We discussed the following Behavioral Modification Strategies today: increasing lean protein intake, increasing vegetables and work on meal planning and easy cooking plans, better snacking choices, and planning for success   Autumn Curry has agreed to follow up with our clinic in 2 weeks. She was informed of the importance of frequent follow up visits to maximize her success with intensive lifestyle modifications for her multiple health conditions.   OBESITY BEHAVIORAL INTERVENTION VISIT  Today's visit was # 4 out of 22.  Starting weight: 253 lbs Starting date: 12/14/17 Today's weight : 244 lbs  Today's date: 02/03/2018 Total lbs lost to date: 9    ASK: We discussed the diagnosis of obesity with Autumn Curry today and Autumn Curry agreed to give [place] permission to discuss obesity behavioral modification therapy today.  ASSESS: Autumn Curry has the diagnosis of obesity and her BMI today is 44.62 Autumn Curry is in the action stage of change   ADVISE: Autumn Curry was educated on the multiple health risks of obesity as well as the benefit of weight loss to improve her health. She was advised of the need for long term treatment and the importance of lifestyle modifications.  AGREE: Multiple dietary modification options and treatment options were discussed and  Autumn Curry agreed to the above  obesity treatment plan.  I, Trixie Dredge, am acting as transcriptionist for Ilene Qua, MD  I have reviewed the above documentation for accuracy and completeness,  and I agree with the above. - Ilene Qua, MD

## 2018-02-18 ENCOUNTER — Ambulatory Visit (INDEPENDENT_AMBULATORY_CARE_PROVIDER_SITE_OTHER): Payer: Managed Care, Other (non HMO) | Admitting: Family Medicine

## 2018-02-18 VITALS — BP 115/78 | HR 68 | Temp 98.1°F | Ht 62.0 in | Wt 248.0 lb

## 2018-02-18 DIAGNOSIS — E559 Vitamin D deficiency, unspecified: Secondary | ICD-10-CM | POA: Diagnosis not present

## 2018-02-18 DIAGNOSIS — I10 Essential (primary) hypertension: Secondary | ICD-10-CM

## 2018-02-18 DIAGNOSIS — Z9189 Other specified personal risk factors, not elsewhere classified: Secondary | ICD-10-CM | POA: Diagnosis not present

## 2018-02-18 DIAGNOSIS — Z6841 Body Mass Index (BMI) 40.0 and over, adult: Secondary | ICD-10-CM

## 2018-02-18 MED ORDER — VITAMIN D (ERGOCALCIFEROL) 1.25 MG (50000 UNIT) PO CAPS: 50000.0000 [IU] | ORAL_CAPSULE | ORAL | 0 refills | Status: DC

## 2018-02-23 NOTE — Progress Notes (Signed)
Office: [phone]  /  Fax: [phone]   HPI:   Chief Complaint: OBESITY Autumn Curry is here to discuss her progress with her obesity treatment plan. She is on the Category 2 plan and is following her eating plan approximately 85 % of the time. She states she is doing water aerobics and Zumba 30 minutes 3 times per week. Autumn Curry worked hard to follow the plan. She is currently on her menstrual cycle. She is going back to work August 19th. Autumn Curry reports occasional hunger approximately 2 1/2 to 3 hours after lunch. Her weight is 248 lb (112.5 kg) today and has had a weight gain of 4 pounds over a period of 2 weeks since her last visit. She has lost 5 lbs since starting treatment with [place].  Vitamin D deficiency Autumn Curry has a diagnosis of vitamin D deficiency. She is currently taking vit D and admits fatigue. [name] denies nausea, vomiting or muscle weakness.  At risk for osteopenia and osteoporosis Autumn Curry is at higher risk of osteopenia and osteoporosis due to vitamin D deficiency.   Hypertension Autumn Curry is a 37 y.o. female with hypertension  Autumn Curry denies dizziness or lightheadedness. She is working weight loss to help control her blood pressure with the goal of decreasing her risk of heart attack and stroke. [name] blood pressure is well controlled.  ALLERGIES: No Known Allergies  MEDICATIONS: Current Outpatient Medications on File Prior to Visit  Medication Sig Dispense Refill  . aspirin-acetaminophen-caffeine (EXCEDRIN MIGRAINE) 250-250-65 MG tablet Take by mouth every 6 (six) hours as needed for headache.    Autumn Curry atenolol (TENORMIN) 25 MG tablet Take 1 tablet (25 mg total) by mouth daily. 90 tablet 1  . atorvastatin (LIPITOR) 10 MG tablet Take 1 tablet (10 mg total) by mouth at bedtime. 30 tablet 2  . DESCOVY 200-25 MG tablet TAKE ONE TABLET BY MOUTH ONCE DAILY WITH OR WITHOUT FOOD. 30 tablet 4  . eletriptan (RELPAX) 20 MG tablet Take 1 tablet (20 mg total) by  mouth as needed for migraine or headache. One tablet by mouth at onset of headache. May repeat in 2 hours if headache persists or recurs. 10 tablet 0  . hydrochlorothiazide (HYDRODIURIL) 25 MG tablet TAKE 1 TABLET BY MOUTH ONCE DAILY 90 tablet 1  . Ibuprofen 200 MG CAPS Take by mouth.    . loratadine (CLARITIN) 10 MG tablet Take 10 mg by mouth daily.    . Multiple Vitamins-Minerals (ALIVE WOMENS ENERGY) TABS Take 1 tablet by mouth daily.    . Omega-3-6-9 CAPS Take by mouth daily.    Autumn Curry PREZCOBIX 800-150 MG tablet TAKE ONE TABLET BY MOUTH ONCE DAILY WITH FOOD. STORE AT ROOM TEMPERATURE. 30 tablet 4  . valACYclovir (VALTREX) 500 MG tablet Take 1 tablet (500 mg total) by mouth 2 (two) times daily. 10 tablet 5   No current facility-administered medications on file prior to visit.     PAST MEDICAL HISTORY: Past Medical History:  Diagnosis Date  . Absence of menstruation   . Carbuncle and furuncle of unspecified site   . Elevated blood pressure reading without diagnosis of hypertension   . Encounter for long-term (current) use of other medications   . Headache(784.0)   . HIV infection ([place]) 2008   dx AIDS  . Hyperlipidemia   . Hypertension   . Routine general medical examination at a health care facility   . Screening for malignant neoplasm of the cervix   . Syphilis, unspecified  PAST SURGICAL HISTORY: No past surgical history on file.  SOCIAL HISTORY: Social History   Tobacco Use  . Smoking status: Never Smoker  . Smokeless tobacco: Never Used  Substance Use Topics  . Alcohol use: No    Alcohol/week: 0.0 standard drinks  . Drug use: No    FAMILY HISTORY: Family History  Problem Relation Age of Onset  . Hyperlipidemia Mother   . Hypertension Mother   . Depression Mother   . Coronary artery disease Father   . Diabetes Father   . Heart disease Father   . Hyperlipidemia Father   . Stroke Father   . Kidney disease Father     ROS: Review of Systems  Constitutional:  Positive for malaise/fatigue. Negative for weight loss.  Gastrointestinal: Negative for nausea and vomiting.  Musculoskeletal:       Negative for muscle weakness  Neurological: Negative for dizziness.       Negative for lightheadedness    PHYSICAL EXAM: Blood pressure 115/78, pulse 68, temperature 98.1 F (36.7 C), temperature source Oral, height 5\' 2"  (1.575 m), weight 248 lb (112.5 kg), SpO2 98 %. Body mass index is 45.36 kg/m. Physical Exam  Constitutional: She is oriented to person, place, and time. She appears well-developed and well-nourished.  Cardiovascular: Normal rate.  Pulmonary/Chest: Effort normal.  Musculoskeletal: Normal range of motion.  Neurological: She is oriented to person, place, and time.  Skin: Skin is warm and dry.  Psychiatric: She has a normal mood and affect. Her behavior is normal.  Vitals reviewed.   RECENT LABS AND TESTS: BMET    Component Value Date/Time   NA 139 01/26/2018 1814   K 3.7 01/26/2018 1814   CL 104 01/26/2018 1814   CO2 28 01/26/2018 1814   GLUCOSE 84 01/26/2018 1814   BUN 18 01/26/2018 1814   CREATININE 1.03 01/26/2018 1814   CREATININE 0.78 11/20/2017 1647   CALCIUM 9.2 01/26/2018 1814   GFRNONAA 73 06/12/2016 1545   GFRAA 84 06/12/2016 1545   Lab Results  Component Value Date   HGBA1C 5.7 (H) 12/14/2017   HGBA1C 5.6 05/25/2017   Lab Results  Component Value Date   INSULIN 31.4 (H) 12/14/2017   CBC    Component Value Date/Time   WBC 7.2 12/14/2017 1131   WBC 6.6 08/18/2016 1624   RBC 4.28 12/14/2017 1131   RBC 4.07 08/18/2016 1624   HGB 12.5 12/14/2017 1131   HCT 39.3 12/14/2017 1131   PLT 323.0 08/18/2016 1624   MCV 92 12/14/2017 1131   MCH 29.2 12/14/2017 1131   MCH 29.3 06/12/2016 1545   MCHC 31.8 12/14/2017 1131   MCHC 32.9 08/18/2016 1624   RDW 14.0 12/14/2017 1131   LYMPHSABS 3.6 (H) 12/14/2017 1131   MONOABS 348 06/12/2016 1545   EOSABS 0.2 12/14/2017 1131   BASOSABS 0.0 12/14/2017 1131    Iron/TIBC/Ferritin/ %Sat No results found for: IRON, TIBC, FERRITIN, IRONPCTSAT Lipid Panel     Component Value Date/Time   CHOL 208 (H) 01/26/2018 1814   TRIG 128.0 01/26/2018 1814   HDL 35.00 (L) 01/26/2018 1814   CHOLHDL 6 01/26/2018 1814   VLDL 25.6 01/26/2018 1814   LDLCALC 148 (H) 01/26/2018 1814   LDLCALC 152 (H) 11/20/2017 1647   Hepatic Function Panel     Component Value Date/Time   PROT 7.6 01/26/2018 1814   ALBUMIN 3.9 01/26/2018 1814   AST 14 01/26/2018 1814   ALT 16 01/26/2018 1814   ALKPHOS 60 01/26/2018 1814  BILITOT 0.3 01/26/2018 1814      Component Value Date/Time   TSH 1.710 12/14/2017 1131   TSH 1.420 05/25/2017 0921   TSH 1.96 08/18/2016 1624   Results for ISIDORA, LAHAM (MRN [id number]) as of 02/23/2018 07:47  Ref. Range 12/14/2017 11:31  Vitamin D, 25-Hydroxy Latest Ref Range: 30.0 - 100.0 ng/mL 24.7 (L)   ASSESSMENT AND PLAN: Vitamin D deficiency - Plan: Vitamin D, Ergocalciferol, (DRISDOL) 50000 units CAPS capsule  Essential hypertension  At risk for osteoporosis  Class 3 severe obesity with serious comorbidity and body mass index (BMI) of 45.0 to 49.9 in adult, unspecified obesity type ([place])  PLAN:  Vitamin D Deficiency Kelie was informed that low vitamin D levels contributes to fatigue and are associated with obesity, breast, and colon cancer. She agrees to continue to take prescription Vit D @50 ,000 IU every week #4 with no refills and will follow up for routine testing of vitamin D, at least 2-3 times per year. She was informed of the risk of over-replacement of vitamin D and agrees to not increase her dose unless she discusses this with [place] first. Eurika agrees to follow up as directed.  At risk for osteopenia and osteoporosis Lauramae is at risk for osteopenia and osteoporosis due to her vitamin D deficiency. She was encouraged to take her vitamin D and follow her higher calcium diet and increase strengthening exercise to help  strengthen her bones and decrease her risk of osteopenia and osteoporosis.  Hypertension We discussed sodium restriction, working on healthy weight loss, and a regular exercise program as the means to achieve improved blood pressure control. Annslee agreed with this plan and agreed to follow up as directed. We will continue to monitor her blood pressure as well as her progress with the above lifestyle modifications. She will continue Atenolol as prescribed and will watch for signs of hypotension as she continues her lifestyle modifications.  Obesity Carrol is currently in the action stage of change. As such, her goal is to continue with weight loss efforts She has agreed to follow the Category 2 plan Rebel has been instructed to work up to a goal of 150 minutes of combined cardio and strengthening exercise per week for weight loss and overall health benefits. We discussed the following Behavioral Modification Strategies today: planning for success, increasing lean protein intake, increasing vegetables and work on meal planning and easy cooking plans  Vineta has agreed to follow up with our clinic in 2 weeks. She was informed of the importance of frequent follow up visits to maximize her success with intensive lifestyle modifications for her multiple health conditions.   OBESITY BEHAVIORAL INTERVENTION VISIT  Today's visit was # 5 out of 22.  Starting weight: 253 lbs Starting date: 12/14/17 Today's weight : 248 lbs  Today's date: 02/18/2018 Total lbs lost to date: 5    ASK: We discussed the diagnosis of obesity with Geryl Councilman today and Sarya agreed to give [place] permission to discuss obesity behavioral modification therapy today.  ASSESS: Payslie has the diagnosis of obesity and her BMI today is 45.35 Lavonne is in the action stage of change   ADVISE: Keishana was educated on the multiple health risks of obesity as well as the benefit of weight loss to improve her health. She  was advised of the need for long term treatment and the importance of lifestyle modifications.  AGREE: Multiple dietary modification options and treatment options were discussed and  Merian agreed to the above obesity  treatment plan.  I, Doreene Nest, am acting as transcriptionist for Eber Jones, MD  I have reviewed the above documentation for accuracy and completeness, and I agree with the above. - Ilene Qua, MD

## 2018-03-08 ENCOUNTER — Other Ambulatory Visit: Payer: Self-pay | Admitting: Internal Medicine

## 2018-03-08 DIAGNOSIS — B20 Human immunodeficiency virus [HIV] disease: Secondary | ICD-10-CM

## 2018-03-10 ENCOUNTER — Ambulatory Visit (INDEPENDENT_AMBULATORY_CARE_PROVIDER_SITE_OTHER): Payer: Managed Care, Other (non HMO) | Admitting: Bariatrics

## 2018-03-10 VITALS — BP 104/71 | HR 66 | Temp 98.1°F | Ht 62.0 in | Wt 244.0 lb

## 2018-03-10 DIAGNOSIS — Z6841 Body Mass Index (BMI) 40.0 and over, adult: Secondary | ICD-10-CM

## 2018-03-10 DIAGNOSIS — I10 Essential (primary) hypertension: Secondary | ICD-10-CM | POA: Diagnosis not present

## 2018-03-10 DIAGNOSIS — Z9189 Other specified personal risk factors, not elsewhere classified: Secondary | ICD-10-CM | POA: Diagnosis not present

## 2018-03-10 DIAGNOSIS — E559 Vitamin D deficiency, unspecified: Secondary | ICD-10-CM | POA: Diagnosis not present

## 2018-03-10 MED ORDER — VITAMIN D (ERGOCALCIFEROL) 1.25 MG (50000 UNIT) PO CAPS: 50000.0000 [IU] | ORAL_CAPSULE | ORAL | 0 refills | Status: DC

## 2018-03-11 NOTE — Progress Notes (Signed)
Office: [phone]  /  Fax: [phone]   HPI:   Chief Complaint: OBESITY Autumn Curry is here to discuss her progress with her obesity treatment plan. She is on the Category 2 plan and is following her eating plan approximately 80-85 % of the time. She states she is exercising 0 minutes 0 times per week. Autumn Curry has a total weight loss of 9 lbs. She is doing well on Category 2, occasionally struggles with breakfast, but doing well with protein. She denies hunger or significant cravings. She is considering going back to the gym 2 days a week.  Her weight is 244 lb (110.7 kg) today and has had a weight loss of 4 pounds over a period of 3 weeks since her last visit. She has lost 9 lbs since starting treatment with [place].  Vitamin D Deficiency Autumn Curry has a diagnosis of vitamin D deficiency. She is currently taking prescription Vit D and denies nausea, vomiting, muscle weakness, or muscle aches.  Hypertension Autumn Curry is a 37 y.o. female with hypertension. Autumn Curry denies hypotension, dizziness, or lightheadedness. She is taking atenolol and hydrochlorothiazide and denies side effects. She is working weight loss to help control her blood pressure with the goal of decreasing her risk of heart attack and stroke. Autumn Curry's blood pressure is currently controlled.  At risk for cardiovascular disease Autumn Curry is at a higher than average risk for cardiovascular disease due to obesity and hypertension. She currently denies any chest pain.  ALLERGIES: No Known Allergies  MEDICATIONS: Current Outpatient Medications on File Prior to Visit  Medication Sig Dispense Refill  . aspirin-acetaminophen-caffeine (EXCEDRIN MIGRAINE) 250-250-65 MG tablet Take by mouth every 6 (six) hours as needed for headache.    [name] atenolol (TENORMIN) 25 MG tablet Take 1 tablet (25 mg total) by mouth daily. 90 tablet 1  . atorvastatin (LIPITOR) 10 MG tablet Take 1 tablet (10 mg total) by mouth at bedtime. 30 tablet 2  .  DESCOVY 200-25 MG tablet TAKE ONE TABLET BY MOUTH ONCE DAILY WITH OR WITHOUT FOOD. 30 tablet 4  . eletriptan (RELPAX) 20 MG tablet Take 1 tablet (20 mg total) by mouth as needed for migraine or headache. One tablet by mouth at onset of headache. May repeat in 2 hours if headache persists or recurs. 10 tablet 0  . hydrochlorothiazide (HYDRODIURIL) 25 MG tablet TAKE 1 TABLET BY MOUTH ONCE DAILY 90 tablet 1  . Ibuprofen 200 MG CAPS Take by mouth.    . loratadine (CLARITIN) 10 MG tablet Take 10 mg by mouth daily.    . Multiple Vitamins-Minerals (ALIVE WOMENS ENERGY) TABS Take 1 tablet by mouth daily.    . Omega-3-6-9 CAPS Take by mouth daily.    [name] PREZCOBIX 800-150 MG tablet TAKE ONE TABLET BY MOUTH ONCE DAILY WITH FOOD. STORE AT ROOM TEMPERATURE. 30 tablet 4  . valACYclovir (VALTREX) 500 MG tablet Take 1 tablet (500 mg total) by mouth 2 (two) times daily. 10 tablet 5   No current facility-administered medications on file prior to visit.     PAST MEDICAL HISTORY: Past Medical History:  Diagnosis Date  . Absence of menstruation   . Carbuncle and furuncle of unspecified site   . Elevated blood pressure reading without diagnosis of hypertension   . Encounter for long-term (current) use of other medications   . Headache(784.0)   . HIV infection ([place]) 2008   dx AIDS  . Hyperlipidemia   . Hypertension   . Routine general medical examination at a health  care facility   . Screening for malignant neoplasm of the cervix   . Syphilis, unspecified     PAST SURGICAL HISTORY: No past surgical history on file.  SOCIAL HISTORY: Social History   Tobacco Use  . Smoking status: Never Smoker  . Smokeless tobacco: Never Used  Substance Use Topics  . Alcohol use: No    Alcohol/week: 0.0 standard drinks  . Drug use: No    FAMILY HISTORY: Family History  Problem Relation Age of Onset  . Hyperlipidemia Mother   . Hypertension Mother   . Depression Mother   . Coronary artery disease Father   .  Diabetes Father   . Heart disease Father   . Hyperlipidemia Father   . Stroke Father   . Kidney disease Father     ROS: Review of Systems  Constitutional: Positive for weight loss.  Cardiovascular: Negative for chest pain.  Gastrointestinal: Negative for nausea and vomiting.  Musculoskeletal:       Negative muscle weakness Negative muscle aches  Neurological: Negative for dizziness.       Negative lightheadedness    PHYSICAL EXAM: Blood pressure 104/71, pulse 66, temperature 98.1 F (36.7 C), height 5\' 2"  (1.575 m), weight 244 lb (110.7 kg), SpO2 98 %. Body mass index is 44.63 kg/m. Physical Exam  Constitutional: She is oriented to person, place, and time. She appears well-developed and well-nourished.  Cardiovascular: Normal rate.  Pulmonary/Chest: Effort normal.  Musculoskeletal: Normal range of motion.  Neurological: She is oriented to person, place, and time.  Skin: Skin is warm and dry.  Psychiatric: She has a normal mood and affect. Her behavior is normal.  Vitals reviewed.   RECENT LABS AND TESTS: BMET    Component Value Date/Time   NA 139 01/26/2018 1814   K 3.7 01/26/2018 1814   CL 104 01/26/2018 1814   CO2 28 01/26/2018 1814   GLUCOSE 84 01/26/2018 1814   BUN 18 01/26/2018 1814   CREATININE 1.03 01/26/2018 1814   CREATININE 0.78 11/20/2017 1647   CALCIUM 9.2 01/26/2018 1814   GFRNONAA 73 06/12/2016 1545   GFRAA 84 06/12/2016 1545   Lab Results  Component Value Date   HGBA1C 5.7 (H) 12/14/2017   HGBA1C 5.6 05/25/2017   Lab Results  Component Value Date   INSULIN 31.4 (H) 12/14/2017   CBC    Component Value Date/Time   WBC 7.2 12/14/2017 1131   WBC 6.6 08/18/2016 1624   RBC 4.28 12/14/2017 1131   RBC 4.07 08/18/2016 1624   HGB 12.5 12/14/2017 1131   HCT 39.3 12/14/2017 1131   PLT 323.0 08/18/2016 1624   MCV 92 12/14/2017 1131   MCH 29.2 12/14/2017 1131   MCH 29.3 06/12/2016 1545   MCHC 31.8 12/14/2017 1131   MCHC 32.9 08/18/2016 1624     RDW 14.0 12/14/2017 1131   LYMPHSABS 3.6 (H) 12/14/2017 1131   MONOABS 348 06/12/2016 1545   EOSABS 0.2 12/14/2017 1131   BASOSABS 0.0 12/14/2017 1131   Iron/TIBC/Ferritin/ %Sat No results found for: IRON, TIBC, FERRITIN, IRONPCTSAT Lipid Panel     Component Value Date/Time   CHOL 208 (H) 01/26/2018 1814   TRIG 128.0 01/26/2018 1814   HDL 35.00 (L) 01/26/2018 1814   CHOLHDL 6 01/26/2018 1814   VLDL 25.6 01/26/2018 1814   LDLCALC 148 (H) 01/26/2018 1814   LDLCALC 152 (H) 11/20/2017 1647   Hepatic Function Panel     Component Value Date/Time   PROT 7.6 01/26/2018 1814   ALBUMIN  3.9 01/26/2018 1814   AST 14 01/26/2018 1814   ALT 16 01/26/2018 1814   ALKPHOS 60 01/26/2018 1814   BILITOT 0.3 01/26/2018 1814      Component Value Date/Time   TSH 1.710 12/14/2017 1131   TSH 1.420 05/25/2017 0921   TSH 1.96 08/18/2016 1624  Results for GIAVANA, ROOKE (MRN [id number]) as of 03/11/2018 13:08  Ref. Range 12/14/2017 11:31  Vitamin D, 25-Hydroxy Latest Ref Range: 30.0 - 100.0 ng/mL 24.7 (L)    ASSESSMENT AND PLAN: Vitamin D deficiency - Plan: Vitamin D, Ergocalciferol, (DRISDOL) 50000 units CAPS capsule  Essential hypertension  At risk for heart disease  Class 3 severe obesity with serious comorbidity and body mass index (BMI) of 40.0 to 44.9 in adult, unspecified obesity type ([place])  PLAN:  Vitamin D Deficiency Autumn Curry was informed that low vitamin D levels contributes to fatigue and are associated with obesity, breast, and colon cancer. Autumn Curry agrees to continue taking prescription Vit D @50 ,000 IU every week #4 and we will refill for 1 month. She will follow up for routine testing of vitamin D, at least 2-3 times per year. She was informed of the risk of over-replacement of vitamin D and agrees to not increase her dose unless she discusses this with [place] first. Autumn Curry agrees to follow up with our clinic in 2 weeks with Dr. Adair Patter.  Hypertension We discussed sodium  restriction, working on healthy weight loss, and a regular exercise program as the means to achieve improved blood pressure control. Autumn Curry agreed with this plan and agreed to follow up as directed. We will continue to monitor her blood pressure as well as her progress with the above lifestyle modifications. Autumn Curry will continue her medications for blood pressure control and will watch for signs of hypotension as she continues her lifestyle modifications. Autumn Curry agrees to follow up with our clinic in 2 weeks with Dr. Adair Patter.  Cardiovascular risk counselling Autumn Curry was given extended (15 minutes) coronary artery disease prevention counseling today. She is 37 y.o. female and has risk factors for heart disease including obesity and hypertension. We discussed intensive lifestyle modifications today with an emphasis on specific weight loss instructions and strategies. Pt was also informed of the importance of increasing exercise and decreasing saturated fats to help prevent heart disease.  Obesity Autumn Curry is currently in the action stage of change. As such, her goal is to continue with weight loss efforts She has agreed to follow the Category 2 plan Autumn Curry has been instructed to work up to a goal of 150 minutes of combined cardio and strengthening exercise per week or she has a Building services engineer for weight loss and overall health benefits. We discussed the following Behavioral Modification Strategies today: increasing lean protein intake, decreasing simple carbohydrates, increase H20 intake, no skipping meals, and keeping healthy foods in the home  Autumn Curry will increase water intake and continue to pack her lunch.  Autumn Curry has agreed to follow up with our clinic in 2 weeks with Dr. Adair Patter. She was informed of the importance of frequent follow up visits to maximize her success with intensive lifestyle modifications for her multiple health conditions.   OBESITY BEHAVIORAL INTERVENTION VISIT  Today's  visit was # 6   Starting weight: 253 lbs Starting date: 12/14/17 Today's weight : 244 lbs  Today's date: 03/10/2018 Total lbs lost to date: 9 We discussing the following behavioral interventions during this visit.   ASK: We discussed the diagnosis of obesity with Autumn Curry today and  Autumn Curry agreed to give [place] permission to discuss obesity behavioral modification therapy today.  ASSESS: Autumn Curry has the diagnosis of obesity and her BMI today is 44.62 Autumn Curry is in the action stage of change   ADVISE: Autumn Curry was educated on the multiple health risks of obesity as well as the benefit of weight loss to improve her health. She was advised of the need for long term treatment and the importance of lifestyle modifications to improve her current health and to decrease her risk of future health problems.  AGREE: Multiple dietary modification options and treatment options were discussed and  Autumn Curry agreed to follow the recommendations documented in the above note.  ARRANGE: Alorah was educated on the importance of frequent visits to treat obesity as outlined per CMS and USPSTF guidelines and agreed to schedule her next follow up appointment today.  Wilhemena Durie, am acting as transcriptionist for [employer], DO  I have reviewed the above documentation for accuracy and completeness, and I agree with the above. -Jearld Lesch, DO

## 2018-03-24 ENCOUNTER — Other Ambulatory Visit: Payer: Self-pay | Admitting: Internal Medicine

## 2018-03-24 DIAGNOSIS — B20 Human immunodeficiency virus [HIV] disease: Secondary | ICD-10-CM

## 2018-03-25 ENCOUNTER — Ambulatory Visit (INDEPENDENT_AMBULATORY_CARE_PROVIDER_SITE_OTHER): Payer: Self-pay | Admitting: Family Medicine

## 2018-03-29 ENCOUNTER — Ambulatory Visit (INDEPENDENT_AMBULATORY_CARE_PROVIDER_SITE_OTHER): Payer: Managed Care, Other (non HMO) | Admitting: Family Medicine

## 2018-03-29 VITALS — BP 108/68 | HR 75 | Temp 98.1°F | Ht 62.0 in | Wt 246.0 lb

## 2018-03-29 DIAGNOSIS — Z9189 Other specified personal risk factors, not elsewhere classified: Secondary | ICD-10-CM

## 2018-03-29 DIAGNOSIS — Z6841 Body Mass Index (BMI) 40.0 and over, adult: Secondary | ICD-10-CM

## 2018-03-29 DIAGNOSIS — R7303 Prediabetes: Secondary | ICD-10-CM

## 2018-03-29 DIAGNOSIS — E559 Vitamin D deficiency, unspecified: Secondary | ICD-10-CM

## 2018-03-29 MED ORDER — VITAMIN D (ERGOCALCIFEROL) 1.25 MG (50000 UNIT) PO CAPS: 50000.0000 [IU] | ORAL_CAPSULE | ORAL | 0 refills | Status: DC

## 2018-03-31 NOTE — Progress Notes (Signed)
Office: [phone]  /  Fax: [phone]   HPI:   Chief Complaint: OBESITY Autumn Curry is here to discuss her progress with her obesity treatment plan. She is on the Category 2 plan and is following her eating plan approximately 95 % of the time. She states she is exercising 0 minutes 0 times per week. Autumn Curry is on her menstrual cycle and she has been indulging in sweets and rice. She had her birthday weekend, this past weekend; otherwise she has been sticking to the meal plan for the most part. Jaspreet denies any hunger. Her weight is 246 lb (111.6 kg) today and has not lost weight since her last visit. She has lost 7 lbs since starting treatment with [place].  Vitamin D deficiency Autumn Curry has a diagnosis of vitamin D deficiency. Autumn Curry is currently taking vit D and she admits to fatigue, but denies nausea, vomiting or muscle weakness.  At risk for osteopenia and osteoporosis Autumn Curry is at higher risk of osteopenia and osteoporosis due to vitamin D deficiency.   Pre-Diabetes Autumn Curry has a diagnosis of prediabetes based on her elevated Hgb A1c and was informed this puts her at greater risk of developing diabetes. She is not taking metformin currently and continues to work on diet and exercise to decrease risk of diabetes. She denies nausea or hypoglycemia.  ALLERGIES: No Known Allergies  MEDICATIONS: Current Outpatient Medications on File Prior to Visit  Medication Sig Dispense Refill  . aspirin-acetaminophen-caffeine (EXCEDRIN MIGRAINE) 250-250-65 MG tablet Take by mouth every 6 (six) hours as needed for headache.    [name] atenolol (TENORMIN) 25 MG tablet Take 1 tablet (25 mg total) by mouth daily. 90 tablet 1  . atorvastatin (LIPITOR) 10 MG tablet Take 1 tablet (10 mg total) by mouth at bedtime. 30 tablet 2  . DESCOVY 200-25 MG tablet TAKE ONE TABLET BY MOUTH ONCE DAILY WITH OR WITHOUT FOOD. 30 tablet 4  . eletriptan (RELPAX) 20 MG tablet Take 1 tablet (20 mg total) by mouth as needed for  migraine or headache. One tablet by mouth at onset of headache. May repeat in 2 hours if headache persists or recurs. 10 tablet 0  . hydrochlorothiazide (HYDRODIURIL) 25 MG tablet TAKE 1 TABLET BY MOUTH ONCE DAILY 90 tablet 1  . Ibuprofen 200 MG CAPS Take by mouth.    . loratadine (CLARITIN) 10 MG tablet Take 10 mg by mouth daily.    . Multiple Vitamins-Minerals (ALIVE WOMENS ENERGY) TABS Take 1 tablet by mouth daily.    . Omega-3-6-9 CAPS Take by mouth daily.    [name] PREZCOBIX 800-150 MG tablet TAKE ONE TABLET BY MOUTH ONCE DAILY WITH FOOD. STORE AT ROOM TEMPERATURE. 30 tablet 4  . valACYclovir (VALTREX) 500 MG tablet Take 1 tablet (500 mg total) by mouth 2 (two) times daily. 10 tablet 5   No current facility-administered medications on file prior to visit.     PAST MEDICAL HISTORY: Past Medical History:  Diagnosis Date  . Absence of menstruation   . Carbuncle and furuncle of unspecified site   . Elevated blood pressure reading without diagnosis of hypertension   . Encounter for long-term (current) use of other medications   . Headache(784.0)   . HIV infection ([place]) 2008   dx AIDS  . Hyperlipidemia   . Hypertension   . Routine general medical examination at a health care facility   . Screening for malignant neoplasm of the cervix   . Syphilis, unspecified     PAST SURGICAL HISTORY: No  past surgical history on file.  SOCIAL HISTORY: Social History   Tobacco Use  . Smoking status: Never Smoker  . Smokeless tobacco: Never Used  Substance Use Topics  . Alcohol use: No    Alcohol/week: 0.0 standard drinks  . Drug use: No    FAMILY HISTORY: Family History  Problem Relation Age of Onset  . Hyperlipidemia Mother   . Hypertension Mother   . Depression Mother   . Coronary artery disease Father   . Diabetes Father   . Heart disease Father   . Hyperlipidemia Father   . Stroke Father   . Kidney disease Father     ROS: Review of Systems  Constitutional: Positive for  malaise/fatigue. Negative for weight loss.  Gastrointestinal: Negative for nausea and vomiting.  Musculoskeletal:       Negative for muscle weakness  Endo/Heme/Allergies:       Negative for hypoglycemia    PHYSICAL EXAM: Blood pressure 108/68, pulse 75, temperature 98.1 F (36.7 C), temperature source Oral, height 5\' 2"  (1.575 m), weight 246 lb (111.6 kg), last menstrual period 03/24/2018, SpO2 97 %. Body mass index is 44.99 kg/m. Physical Exam  Constitutional: She is oriented to person, place, and time. She appears well-developed and well-nourished.  Cardiovascular: Normal rate.  Pulmonary/Chest: Effort normal.  Musculoskeletal: Normal range of motion.  Neurological: She is oriented to person, place, and time.  Skin: Skin is warm and dry.  Psychiatric: She has a normal mood and affect. Her behavior is normal.  Vitals reviewed.   RECENT LABS AND TESTS: BMET    Component Value Date/Time   NA 139 01/26/2018 1814   K 3.7 01/26/2018 1814   CL 104 01/26/2018 1814   CO2 28 01/26/2018 1814   GLUCOSE 84 01/26/2018 1814   BUN 18 01/26/2018 1814   CREATININE 1.03 01/26/2018 1814   CREATININE 0.78 11/20/2017 1647   CALCIUM 9.2 01/26/2018 1814   GFRNONAA 73 06/12/2016 1545   GFRAA 84 06/12/2016 1545   Lab Results  Component Value Date   HGBA1C 5.7 (H) 12/14/2017   HGBA1C 5.6 05/25/2017   Lab Results  Component Value Date   INSULIN 31.4 (H) 12/14/2017   CBC    Component Value Date/Time   WBC 7.2 12/14/2017 1131   WBC 6.6 08/18/2016 1624   RBC 4.28 12/14/2017 1131   RBC 4.07 08/18/2016 1624   HGB 12.5 12/14/2017 1131   HCT 39.3 12/14/2017 1131   PLT 323.0 08/18/2016 1624   MCV 92 12/14/2017 1131   MCH 29.2 12/14/2017 1131   MCH 29.3 06/12/2016 1545   MCHC 31.8 12/14/2017 1131   MCHC 32.9 08/18/2016 1624   RDW 14.0 12/14/2017 1131   LYMPHSABS 3.6 (H) 12/14/2017 1131   MONOABS 348 06/12/2016 1545   EOSABS 0.2 12/14/2017 1131   BASOSABS 0.0 12/14/2017 1131    Iron/TIBC/Ferritin/ %Sat No results found for: IRON, TIBC, FERRITIN, IRONPCTSAT Lipid Panel     Component Value Date/Time   CHOL 208 (H) 01/26/2018 1814   TRIG 128.0 01/26/2018 1814   HDL 35.00 (L) 01/26/2018 1814   CHOLHDL 6 01/26/2018 1814   VLDL 25.6 01/26/2018 1814   LDLCALC 148 (H) 01/26/2018 1814   LDLCALC 152 (H) 11/20/2017 1647   Hepatic Function Panel     Component Value Date/Time   PROT 7.6 01/26/2018 1814   ALBUMIN 3.9 01/26/2018 1814   AST 14 01/26/2018 1814   ALT 16 01/26/2018 1814   ALKPHOS 60 01/26/2018 1814   BILITOT 0.3 01/26/2018  1814      Component Value Date/Time   TSH 1.710 12/14/2017 1131   TSH 1.420 05/25/2017 0921   TSH 1.96 08/18/2016 1624   Results for IRLENE, CRUDUP (MRN [id number]) as of 03/31/2018 09:14  Ref. Range 12/14/2017 11:31  Vitamin D, 25-Hydroxy Latest Ref Range: 30.0 - 100.0 ng/mL 24.7 (L)   ASSESSMENT AND PLAN: Vitamin D deficiency - Plan: Vitamin D, Ergocalciferol, (DRISDOL) 50000 units CAPS capsule  Prediabetes  At risk for osteoporosis  Class 3 severe obesity with serious comorbidity and body mass index (BMI) of 45.0 to 49.9 in adult, unspecified obesity type ([place])  PLAN:  Vitamin D Deficiency Autumn Curry was informed that low vitamin D levels contributes to fatigue and are associated with obesity, breast, and colon cancer. She agrees to continue to take prescription Vit D @50 ,000 IU every week #4 with no refills and will follow up for routine testing of vitamin D, at least 2-3 times per year. She was informed of the risk of over-replacement of vitamin D and agrees to not increase her dose unless she discusses this with [place] first. Autumn Curry agrees to follow up as directed.  At risk for osteopenia and osteoporosis Autumn Curry was given extended  (15 minutes) osteoporosis prevention counseling today. Autumn Curry is at risk for osteopenia and osteoporosis due to her vitamin D deficiency. She was encouraged to take her vitamin D and follow  her higher calcium diet and increase strengthening exercise to help strengthen her bones and decrease her risk of osteopenia and osteoporosis.  Pre-Diabetes Autumn Curry will continue to work on weight loss, exercise, and decreasing simple carbohydrates in her diet to help decrease the risk of diabetes. She was informed that eating too many simple carbohydrates or too many calories at one sitting increases the likelihood of GI side effects. We will repeat Hgb A1c and insulin at the next appointment and Autumn Curry agreed to follow up with [place] as directed to monitor her progress.  Obesity Autumn Curry is currently in the action stage of change. As such, her goal is to continue with weight loss efforts She has agreed to follow the Category 2 plan Autumn Curry has been instructed to work up to a goal of 150 minutes of combined cardio and strengthening exercise per week for weight loss and overall health benefits. We discussed the following Behavioral Modification Strategies today: planning for success, increasing lean protein intake, increasing vegetables and work on meal planning and easy cooking plans  Autumn Curry has agreed to follow up with our clinic in 2 weeks. She was informed of the importance of frequent follow up visits to maximize her success with intensive lifestyle modifications for her multiple health conditions.   OBESITY BEHAVIORAL INTERVENTION VISIT  Today's visit was # 7   Starting weight: 253 lbs Starting date: 12/14/17 Today's weight : 246 lbs Today's date: 03/29/2018 Total lbs lost to date: 7   ASK: We discussed the diagnosis of obesity with Autumn Curry today and Autumn Curry agreed to give [place] permission to discuss obesity behavioral modification therapy today.  ASSESS: Autumn Curry has the diagnosis of obesity and her BMI today is 44.98 Autumn Curry is in the action stage of change   ADVISE: Autumn Curry was educated on the multiple health risks of obesity as well as the benefit of weight loss to improve  her health. She was advised of the need for long term treatment and the importance of lifestyle modifications to improve her current health and to decrease her risk of future health problems.  AGREE: Multiple  dietary modification options and treatment options were discussed and  Autumn Curry agreed to follow the recommendations documented in the above note.  ARRANGE: Autumn Curry was educated on the importance of frequent visits to treat obesity as outlined per CMS and USPSTF guidelines and agreed to schedule her next follow up appointment today.  I, Doreene Nest, am acting as transcriptionist for Eber Jones, MD  I have reviewed the above documentation for accuracy and completeness, and I agree with the above. - Ilene Qua, MD

## 2018-04-06 ENCOUNTER — Other Ambulatory Visit: Payer: Self-pay | Admitting: Behavioral Health

## 2018-04-06 DIAGNOSIS — B20 Human immunodeficiency virus [HIV] disease: Secondary | ICD-10-CM

## 2018-04-06 MED ORDER — DARUNAVIR-COBICISTAT 800-150 MG PO TABS: ORAL_TABLET | ORAL | 0 refills | Status: DC

## 2018-04-06 MED ORDER — EMTRICITABINE-TENOFOVIR AF 200-25 MG PO TABS: ORAL_TABLET | ORAL | 0 refills | Status: DC

## 2018-04-07 ENCOUNTER — Other Ambulatory Visit: Payer: Self-pay | Admitting: *Deleted

## 2018-04-07 ENCOUNTER — Other Ambulatory Visit (HOSPITAL_COMMUNITY)
Admission: RE | Admit: 2018-04-07 | Discharge: 2018-04-07 | Disposition: A | Discharge status: Home / Self Care | Payer: Managed Care, Other (non HMO) | Source: Ambulatory Visit | Attending: Internal Medicine | Admitting: Internal Medicine

## 2018-04-07 ENCOUNTER — Other Ambulatory Visit: Payer: Managed Care, Other (non HMO)

## 2018-04-07 DIAGNOSIS — B20 Human immunodeficiency virus [HIV] disease: Secondary | ICD-10-CM | POA: Insufficient documentation

## 2018-04-09 LAB — CBC WITH DIFFERENTIAL/PLATELET
Basophils Absolute: 50 cells/uL (ref 0–200)
Basophils Relative: 0.7 %
EOS PCT: 1.2 %
Eosinophils Absolute: 86 cells/uL (ref 15–500)
HCT: 35.6 % (ref 35.0–45.0)
Hemoglobin: 12.1 g/dL (ref 11.7–15.5)
Lymphs Abs: 3269 cells/uL (ref 850–3900)
MCH: 29.5 pg (ref 27.0–33.0)
MCHC: 34 g/dL (ref 32.0–36.0)
MCV: 86.8 fL (ref 80.0–100.0)
MONOS PCT: 5.8 %
MPV: 10.4 fL (ref 7.5–12.5)
NEUTROS PCT: 46.9 %
Neutro Abs: 3377 cells/uL (ref 1500–7800)
PLATELETS: 351 10*3/uL (ref 140–400)
RBC: 4.1 10*6/uL (ref 3.80–5.10)
RDW: 12.7 % (ref 11.0–15.0)
TOTAL LYMPHOCYTE: 45.4 %
WBC: 7.2 10*3/uL (ref 3.8–10.8)
WBCMIX: 418 {cells}/uL (ref 200–950)

## 2018-04-09 LAB — URINE CYTOLOGY ANCILLARY ONLY
Chlamydia: NEGATIVE
Neisseria Gonorrhea: NEGATIVE

## 2018-04-09 LAB — COMPLETE METABOLIC PANEL WITH GFR
AG Ratio: 1.2 (calc) (ref 1.0–2.5)
ALBUMIN MSPROF: 4.1 g/dL (ref 3.6–5.1)
ALT: 14 U/L (ref 6–29)
AST: 17 U/L (ref 10–30)
Alkaline phosphatase (APISO): 78 U/L (ref 33–115)
BUN: 16 mg/dL (ref 7–25)
CO2: 29 mmol/L (ref 20–32)
CREATININE: 0.8 mg/dL (ref 0.50–1.10)
Calcium: 10 mg/dL (ref 8.6–10.2)
Chloride: 101 mmol/L (ref 98–110)
GFR, EST AFRICAN AMERICAN: 108 mL/min/{1.73_m2} (ref >=60)
GFR, Est Non African American: 94 mL/min/{1.73_m2} (ref >=60)
GLOBULIN: 3.3 g/dL (ref 1.9–3.7)
GLUCOSE: 90 mg/dL (ref 65–99)
Potassium: 3.5 mmol/L (ref 3.5–5.3)
Sodium: 139 mmol/L (ref 135–146)
TOTAL PROTEIN: 7.4 g/dL (ref 6.1–8.1)
Total Bilirubin: 0.4 mg/dL (ref 0.2–1.2)

## 2018-04-09 LAB — RPR: RPR Ser Ql: NONREACTIVE

## 2018-04-09 LAB — HIV-1 RNA QUANT-NO REFLEX-BLD
HIV 1 RNA Quant: <20 copies/mL
HIV-1 RNA Quant, Log: <1.3 Log copies/mL

## 2018-04-09 LAB — T-HELPER CELL (CD4) - (RCID CLINIC ONLY)
CD4 % Helper T Cell: 37 % (ref 33–55)
CD4 T CELL ABS: 1180 /uL (ref 400–2700)

## 2018-04-13 ENCOUNTER — Ambulatory Visit (INDEPENDENT_AMBULATORY_CARE_PROVIDER_SITE_OTHER): Payer: Managed Care, Other (non HMO) | Admitting: Family Medicine

## 2018-04-21 ENCOUNTER — Ambulatory Visit (INDEPENDENT_AMBULATORY_CARE_PROVIDER_SITE_OTHER): Payer: Managed Care, Other (non HMO) | Admitting: Internal Medicine

## 2018-04-21 ENCOUNTER — Encounter: Payer: Self-pay | Admitting: Internal Medicine

## 2018-04-21 DIAGNOSIS — B20 Human immunodeficiency virus [HIV] disease: Secondary | ICD-10-CM | POA: Diagnosis not present

## 2018-04-21 DIAGNOSIS — Z23 Encounter for immunization: Secondary | ICD-10-CM | POA: Diagnosis not present

## 2018-04-21 MED ORDER — DARUN-COBIC-EMTRICIT-TENOFAF 800-150-200-10 MG PO TABS: 1.0000 | ORAL_TABLET | Freq: Every day | ORAL | 11 refills | Status: DC

## 2018-04-21 NOTE — Progress Notes (Signed)
Patient Active Problem List   Diagnosis Date Noted  . Dyslipidemia 05/05/2012    Priority: High  . Hypertension 09/30/2011    Priority: High  . Human immunodeficiency virus (HIV) disease ([place]) 09/03/2006    Priority: High  . Morbid obesity ([place]) 09/30/2011    Priority: Medium  . Hyperlipidemia 01/26/2018  . Other fatigue 12/14/2017  . Shortness of breath on exertion 12/14/2017  . Essential hypertension 12/14/2017  . Preventative health care 08/18/2016  . Genital herpes 02/16/2012  . 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 BOILS, RECURRENT 08/31/2007  . HEADACHE 08/31/2007  . 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  Previous Medications   ASPIRIN-ACETAMINOPHEN-CAFFEINE (EXCEDRIN MIGRAINE) 250-250-65 MG TABLET    Take by mouth every 6 (six) hours as needed for headache.   ATENOLOL (TENORMIN) 25 MG TABLET    Take 1 tablet (25 mg total) by mouth daily.   ATORVASTATIN (LIPITOR) 10 MG TABLET    Take 1 tablet (10 mg total) by mouth at bedtime.   ELETRIPTAN (RELPAX) 20 MG TABLET    Take 1 tablet (20 mg total) by mouth as needed for migraine or headache. One tablet by mouth at onset of headache. May repeat in 2 hours if headache persists or recurs.   HYDROCHLOROTHIAZIDE (HYDRODIURIL) 25 MG TABLET    TAKE 1 TABLET BY MOUTH ONCE DAILY   IBUPROFEN 200 MG CAPS    Take by mouth.   LORATADINE (CLARITIN) 10 MG TABLET    Take 10 mg by mouth daily.   MULTIPLE VITAMINS-MINERALS (ALIVE WOMENS ENERGY) TABS    Take 1 tablet by mouth daily.   OMEGA-3-6-9 CAPS    Take by mouth daily.   VALACYCLOVIR (VALTREX) 500 MG TABLET    Take 1 tablet (500 mg total) by mouth 2 (two) times daily.   VITAMIN D, ERGOCALCIFEROL, (DRISDOL) 50000 UNITS CAPS CAPSULE    Take 1 capsule (50,000 Units total) by mouth every 7 (seven) days.  Modified Medications     No medications on file  Discontinued Medications   DARUNAVIR-COBICISTAT (PREZCOBIX) 800-150 MG TABLET    TAKE ONE TABLET BY MOUTH ONCE DAILY WITH FOOD. STORE AT ROOM TEMPERATURE.   EMTRICITABINE-TENOFOVIR AF (DESCOVY) 200-25 MG TABLET    TAKE ONE TABLET BY MOUTH ONCE DAILY WITH OR WITHOUT FOOD.    Subjective: Autumn Curry is in for her first visit since December 2017.  She had no problems obtaining, taking or tolerating her Descovy and Prezcobix until she ran out at the end of September.  She denies missing any doses prior to that time.  She is feeling well.  She recently started a weight loss program.  She belongs to a gym but she has not been going.  She has talked to her son and they are planning on starting to go to the gym together.  Review of Systems: Review of Systems  Constitutional: Negative for chills, diaphoresis, fever, malaise/fatigue and weight loss.  HENT: Negative for sore throat.   Respiratory: Negative for cough, sputum production and shortness of breath.   Cardiovascular: Negative for chest pain.  Gastrointestinal: Negative for abdominal pain, diarrhea, heartburn, nausea and vomiting.  Genitourinary: Negative for dysuria and frequency.  Musculoskeletal: Negative for joint pain and myalgias.  Skin: Negative for rash.  Neurological: Negative for dizziness and headaches.  Psychiatric/Behavioral: Negative for depression and  substance abuse. The patient is not nervous/anxious.     Past Medical History:  Diagnosis Date  . Absence of menstruation   . Carbuncle and furuncle of unspecified site   . Elevated blood pressure reading without diagnosis of hypertension   . Encounter for long-term (current) use of other medications   . Headache(784.0)   . HIV infection ([place]) 2008   dx AIDS  . Hyperlipidemia   . Hypertension   . Routine general medical examination at a health care facility   . Screening for malignant neoplasm of the cervix   . Syphilis, unspecified     Social  History   Tobacco Use  . Smoking status: Never Smoker  . Smokeless tobacco: Never Used  Substance Use Topics  . Alcohol use: No    Alcohol/week: 0.0 standard drinks  . Drug use: No    Family History  Problem Relation Age of Onset  . Hyperlipidemia Mother   . Hypertension Mother   . Depression Mother   . Coronary artery disease Father   . Diabetes Father   . Heart disease Father   . Hyperlipidemia Father   . Stroke Father   . Kidney disease Father     No Known Allergies  Health Maintenance  Topic Date Due  . PAP SMEAR  07/11/2017  . INFLUENZA VACCINE  02/11/2018  . TETANUS/TDAP  07/11/2026  . HIV Screening  Completed    Objective:  Vitals:   04/21/18 1540  BP: 123/82  Pulse: 79  Temp: 98.6 F (37 C)  TempSrc: Oral  Weight: 245 lb (111.1 kg)  Height: 5\' 2"  (1.575 m)   Body mass index is 44.81 kg/m.  Physical Exam  Constitutional: She is oriented to person, place, and time.  She is in good spirits as usual.  HENT:  Mouth/Throat: No oropharyngeal exudate.  Eyes: Conjunctivae are normal.  Cardiovascular: Normal rate, regular rhythm and normal heart sounds.  No murmur heard. Pulmonary/Chest: Breath sounds normal.  Abdominal: Soft. She exhibits no mass. There is no tenderness.  Musculoskeletal: Normal range of motion.  Neurological: She is alert and oriented to person, place, and time.  Skin: No rash noted.  Psychiatric: She has a normal mood and affect.    Lab Results Lab Results  Component Value Date   WBC 7.2 04/07/2018   HGB 12.1 04/07/2018   HCT 35.6 04/07/2018   MCV 86.8 04/07/2018   PLT 351 04/07/2018    Lab Results  Component Value Date   CREATININE 0.80 04/07/2018   BUN 16 04/07/2018   NA 139 04/07/2018   K 3.5 04/07/2018   CL 101 04/07/2018   CO2 29 04/07/2018    Lab Results  Component Value Date   ALT 14 04/07/2018   AST 17 04/07/2018   ALKPHOS 60 01/26/2018   BILITOT 0.4 04/07/2018    Lab Results  Component Value Date    CHOL 208 (H) 01/26/2018   HDL 35.00 (L) 01/26/2018   LDLCALC 148 (H) 01/26/2018   TRIG 128.0 01/26/2018   CHOLHDL 6 01/26/2018   Lab Results  Component Value Date   LABRPR NON-REACTIVE 04/07/2018   HIV 1 RNA Quant (copies/mL)  Date Value  04/07/2018 <20 NOT DETECTED  06/12/2016 <20  06/14/2015 <20   CD4 T Cell Abs (/uL)  Date Value  04/07/2018 1,180  06/12/2016 960  06/14/2015 1,110     Problem List Items Addressed This Visit      High   Human immunodeficiency virus (HIV) disease ([place])  Her infection remains under excellent, long-term control.  I will consolidate her antiretroviral regimen into Symtuza.  She received her influenza vaccine today.  She will follow-up after lab work in 1 year.      Relevant Medications   Darunavir-Cobicisctat-Emtricitabine-Tenofovir Alafenamide (SYMTUZA) 800-150-200-10 MG TABS   Other Relevant Orders   CBC   T-helper cell (CD4)- ([facility] clinic only)   Comprehensive metabolic panel   Lipid panel   RPR   HIV-1 RNA quant-no reflex-bld        Michel Bickers, MD [facility] for [facility] [phone] pager   [phone] cell 04/21/2018, 4:01 PM

## 2018-04-21 NOTE — Assessment & Plan Note (Signed)
Her infection remains under excellent, long-term control.  I will consolidate her antiretroviral regimen into Symtuza.  She received her influenza vaccine today.  She will follow-up after lab work in 1 year.

## 2018-04-22 ENCOUNTER — Ambulatory Visit (INDEPENDENT_AMBULATORY_CARE_PROVIDER_SITE_OTHER): Payer: Managed Care, Other (non HMO) | Admitting: Family Medicine

## 2018-04-22 VITALS — BP 104/73 | HR 79 | Temp 98.1°F | Ht 62.0 in | Wt 239.0 lb

## 2018-04-22 DIAGNOSIS — E559 Vitamin D deficiency, unspecified: Secondary | ICD-10-CM

## 2018-04-22 DIAGNOSIS — I10 Essential (primary) hypertension: Secondary | ICD-10-CM | POA: Diagnosis not present

## 2018-04-22 DIAGNOSIS — Z6841 Body Mass Index (BMI) 40.0 and over, adult: Secondary | ICD-10-CM

## 2018-04-22 DIAGNOSIS — R7303 Prediabetes: Secondary | ICD-10-CM | POA: Diagnosis not present

## 2018-04-22 DIAGNOSIS — Z9189 Other specified personal risk factors, not elsewhere classified: Secondary | ICD-10-CM

## 2018-04-22 MED ORDER — VITAMIN D (ERGOCALCIFEROL) 1.25 MG (50000 UNIT) PO CAPS: 50000.0000 [IU] | ORAL_CAPSULE | ORAL | 0 refills | Status: DC

## 2018-04-23 LAB — INSULIN, RANDOM: INSULIN: 28.7 u[IU]/mL — ABNORMAL HIGH (ref 2.6–24.9)

## 2018-04-23 LAB — HEMOGLOBIN A1C
ESTIMATED AVERAGE GLUCOSE: 117 mg/dL
HEMOGLOBIN A1C: 5.7 % — AB (ref 4.8–5.6)

## 2018-04-23 LAB — LIPID PANEL WITH LDL/HDL RATIO
CHOLESTEROL TOTAL: 194 mg/dL (ref 100–199)
HDL: 35 mg/dL — ABNORMAL LOW (ref >39)
LDL CALC: 138 mg/dL — AB (ref 0–99)
LDL/HDL RATIO: 3.9 ratio — AB (ref 0.0–3.2)
TRIGLYCERIDES: 103 mg/dL (ref 0–149)
VLDL Cholesterol Cal: 21 mg/dL (ref 5–40)

## 2018-04-23 LAB — VITAMIN D 25 HYDROXY (VIT D DEFICIENCY, FRACTURES): Vit D, 25-Hydroxy: 39.6 ng/mL (ref 30.0–100.0)

## 2018-04-27 NOTE — Progress Notes (Signed)
Office: [phone]  /  Fax: [phone]   HPI:   Chief Complaint: OBESITY Autumn Curry is here to discuss her progress with her obesity treatment plan. She is on the Category 2 plan and is following her eating plan approximately 85 % of the time. She states she is walking for 25-30 minutes 2-3 times per week. Autumn Curry has been more consistent. She notes structure has made things easier. She has noticed cravings.  Her weight is 239 lb (108.4 kg) today and has had a weight loss of 7 pounds over a period of 3 to 4 weeks since her last visit. She has lost 14 lbs since starting treatment with [place].  Vitamin D Deficiency Autumn Curry has a diagnosis of vitamin D deficiency. She is currently taking prescription Vit D. She notes fatigue and denies nausea, vomiting or muscle weakness.  At risk for osteopenia and osteoporosis Autumn Curry is at higher risk of osteopenia and osteoporosis due to vitamin D deficiency.   Hypertension Autumn Curry is a 37 y.o. female with hypertension. Autumn Curry's blood pressure is controlled. She denies chest pain, chest pressure, or headache. She is working weight loss to help control her blood pressure with the goal of decreasing her risk of heart attack and stroke.   Pre-Diabetes Autumn Curry has a diagnosis of pre-diabetes based on her elevated Hgb A1c and was informed this puts her at greater risk of developing diabetes. She notes cravings and she is not on medications. She continues to work on diet and exercise to decrease risk of diabetes. She denies nausea or hypoglycemia.  ALLERGIES: No Known Allergies  MEDICATIONS: Current Outpatient Medications on File Prior to Visit  Medication Sig Dispense Refill  . aspirin-acetaminophen-caffeine (EXCEDRIN MIGRAINE) 250-250-65 MG tablet Take by mouth every 6 (six) hours as needed for headache.    [name] atenolol (TENORMIN) 25 MG tablet Take 1 tablet (25 mg total) by mouth daily. 90 tablet 1  . atorvastatin (LIPITOR) 10 MG tablet Take 1  tablet (10 mg total) by mouth at bedtime. 30 tablet 2  . Darunavir-Cobicisctat-Emtricitabine-Tenofovir Alafenamide (SYMTUZA) 800-150-200-10 MG TABS Take 1 tablet by mouth daily with breakfast. 30 tablet 11  . eletriptan (RELPAX) 20 MG tablet Take 1 tablet (20 mg total) by mouth as needed for migraine or headache. One tablet by mouth at onset of headache. May repeat in 2 hours if headache persists or recurs. 10 tablet 0  . hydrochlorothiazide (HYDRODIURIL) 25 MG tablet TAKE 1 TABLET BY MOUTH ONCE DAILY 90 tablet 1  . Ibuprofen 200 MG CAPS Take by mouth.    . loratadine (CLARITIN) 10 MG tablet Take 10 mg by mouth daily.    . Multiple Vitamins-Minerals (ALIVE WOMENS ENERGY) TABS Take 1 tablet by mouth daily.    . Omega-3-6-9 CAPS Take by mouth daily.    . valACYclovir (VALTREX) 500 MG tablet Take 1 tablet (500 mg total) by mouth 2 (two) times daily. 10 tablet 5   No current facility-administered medications on file prior to visit.     PAST MEDICAL HISTORY: Past Medical History:  Diagnosis Date  . Absence of menstruation   . Carbuncle and furuncle of unspecified site   . Elevated blood pressure reading without diagnosis of hypertension   . Encounter for long-term (current) use of other medications   . Headache(784.0)   . HIV infection ([place]) 2008   dx AIDS  . Hyperlipidemia   . Hypertension   . Routine general medical examination at a health care facility   . Screening for  malignant neoplasm of the cervix   . Syphilis, unspecified     PAST SURGICAL HISTORY: No past surgical history on file.  SOCIAL HISTORY: Social History   Tobacco Use  . Smoking status: Never Smoker  . Smokeless tobacco: Never Used  Substance Use Topics  . Alcohol use: No    Alcohol/week: 0.0 standard drinks  . Drug use: No    FAMILY HISTORY: Family History  Problem Relation Age of Onset  . Hyperlipidemia Mother   . Hypertension Mother   . Depression Mother   . Coronary artery disease Father   .  Diabetes Father   . Heart disease Father   . Hyperlipidemia Father   . Stroke Father   . Kidney disease Father     ROS: Review of Systems  Constitutional: Positive for malaise/fatigue and weight loss.  Cardiovascular: Negative for chest pain.       Negative chest pressure  Gastrointestinal: Negative for nausea and vomiting.  Musculoskeletal:       Negative muscle weakness  Neurological: Negative for headaches.  Endo/Heme/Allergies:       Negative hypoglycemia    PHYSICAL EXAM: Blood pressure 104/73, pulse 79, temperature 98.1 F (36.7 C), temperature source Oral, height 5\' 2"  (1.575 m), weight 239 lb (108.4 kg), last menstrual period 03/24/2018, SpO2 97 %. Body mass index is 43.71 kg/m. Physical Exam  Constitutional: She is oriented to person, place, and time. She appears well-developed and well-nourished.  Cardiovascular: Normal rate.  Pulmonary/Chest: Effort normal.  Musculoskeletal: Normal range of motion.  Neurological: She is oriented to person, place, and time.  Skin: Skin is warm and dry.  Psychiatric: She has a normal mood and affect. Her behavior is normal.  Vitals reviewed.   RECENT LABS AND TESTS: BMET    Component Value Date/Time   NA 139 04/07/2018 1639   K 3.5 04/07/2018 1639   CL 101 04/07/2018 1639   CO2 29 04/07/2018 1639   GLUCOSE 90 04/07/2018 1639   BUN 16 04/07/2018 1639   CREATININE 0.80 04/07/2018 1639   CALCIUM 10.0 04/07/2018 1639   GFRNONAA 94 04/07/2018 1639   GFRAA 108 04/07/2018 1639   Lab Results  Component Value Date   HGBA1C 5.7 (H) 04/22/2018   HGBA1C 5.7 (H) 12/14/2017   HGBA1C 5.6 05/25/2017   Lab Results  Component Value Date   INSULIN 28.7 (H) 04/22/2018   INSULIN 31.4 (H) 12/14/2017   CBC    Component Value Date/Time   WBC 7.2 04/07/2018 1639   RBC 4.10 04/07/2018 1639   HGB 12.1 04/07/2018 1639   HGB 12.5 12/14/2017 1131   HCT 35.6 04/07/2018 1639   HCT 39.3 12/14/2017 1131   PLT 351 04/07/2018 1639   MCV  86.8 04/07/2018 1639   MCV 92 12/14/2017 1131   MCH 29.5 04/07/2018 1639   MCHC 34.0 04/07/2018 1639   RDW 12.7 04/07/2018 1639   RDW 14.0 12/14/2017 1131   LYMPHSABS 3,269 04/07/2018 1639   LYMPHSABS 3.6 (H) 12/14/2017 1131   MONOABS 348 06/12/2016 1545   EOSABS 86 04/07/2018 1639   EOSABS 0.2 12/14/2017 1131   BASOSABS 50 04/07/2018 1639   BASOSABS 0.0 12/14/2017 1131   Iron/TIBC/Ferritin/ %Sat No results found for: IRON, TIBC, FERRITIN, IRONPCTSAT Lipid Panel     Component Value Date/Time   CHOL 194 04/22/2018 1157   TRIG 103 04/22/2018 1157   HDL 35 (L) 04/22/2018 1157   CHOLHDL 6 01/26/2018 1814   VLDL 25.6 01/26/2018 1814   LDLCALC  138 (H) 04/22/2018 1157   LDLCALC 152 (H) 11/20/2017 1647   Hepatic Function Panel     Component Value Date/Time   PROT 7.4 04/07/2018 1639   ALBUMIN 3.9 01/26/2018 1814   AST 17 04/07/2018 1639   ALT 14 04/07/2018 1639   ALKPHOS 60 01/26/2018 1814   BILITOT 0.4 04/07/2018 1639      Component Value Date/Time   TSH 1.710 12/14/2017 1131   TSH 1.420 05/25/2017 0921   TSH 1.96 08/18/2016 1624   Results for ELSI, STELZER (MRN [id number]) as of 04/27/2018 13:43  Ref. Range 04/22/2018 11:57  Vitamin D, 25-Hydroxy Latest Ref Range: 30.0 - 100.0 ng/mL 39.6   ASSESSMENT AND PLAN: Vitamin D deficiency - Plan: VITAMIN D 25 Hydroxy (Vit-D Deficiency, Fractures), Vitamin D, Ergocalciferol, (DRISDOL) 50000 units CAPS capsule  Essential hypertension - Plan: Lipid Panel With LDL/HDL Ratio  Prediabetes - Plan: Hemoglobin A1c, Insulin, random  At risk for osteoporosis  Class 3 severe obesity with serious comorbidity and body mass index (BMI) of 40.0 to 44.9 in adult, unspecified obesity type ([place])  PLAN:  Vitamin D Deficiency Tamre was informed that low vitamin D levels contributes to fatigue and are associated with obesity, breast, and colon cancer. Autumn Curry  agrees to continue taking prescription Vit D @50 ,000 IU every week #4 and we  will refill for 1 month. She will follow up for routine testing of vitamin D, at least 2-3 times per year. She was informed of the risk of over-replacement of vitamin D and agrees to not increase her dose unless she discusses this with [place] first. We will check labs today and Autumn Curry agrees to follow up with our clinic in 2 weeks.  At risk for osteopenia and osteoporosis Autumn Curry was given extended (15 minutes) osteoporosis prevention counseling today. Autumn Curry is at risk for osteopenia and osteoporsis due to her vitamin D deficiency. She was encouraged to take her vitamin D and follow her higher calcium diet and increase strengthening exercise to help strengthen her bones and decrease her risk of osteopenia and osteoporosis.  Hypertension We discussed sodium restriction, working on healthy weight loss, and a regular exercise program as the means to achieve improved blood pressure control. Autumn Curry agreed with this plan and agreed to follow up as directed. We will continue to monitor her blood pressure as well as her progress with the above lifestyle modifications. Autumn Curry agrees to continue her current medications and will watch for signs of hypotension as she continues her lifestyle modifications. We will check labs today and Autumn Curry agrees to follow up with our clinic in 2 weeks.  Pre-Diabetes Autumn Curry will continue to work on weight loss, exercise, and decreasing simple carbohydrates in her diet to help decrease the risk of diabetes. We dicussed metformin including benefits and risks. She was informed that eating too many simple carbohydrates or too many calories at one sitting increases the likelihood of GI side effects. Najma declined metformin for now and a prescription was not written today. We will check labs today and Autumn Curry agrees to follow up with our clinic in 2 weeks as directed to monitor her progress.  Obesity Autumn Curry is currently in the action stage of change. As such, her goal is to  continue with weight loss efforts She has agreed to follow the Category 2 plan Autumn Curry has been instructed to work up to a goal of 150 minutes of combined cardio and strengthening exercise per week or resistance exercises for 10 minutes 2-3 times per week for  weight loss and overall health benefits. We discussed the following Behavioral Modification Strategies today: increasing lean protein intake, increasing vegetables, work on meal planning and easy cooking plans, better snacking choices, and planning for success   Autumn Curry has agreed to follow up with our clinic in 2 weeks. She was informed of the importance of frequent follow up visits to maximize her success with intensive lifestyle modifications for her multiple health conditions.   OBESITY BEHAVIORAL INTERVENTION VISIT  Today's visit was # 8   Starting weight: 253 lbs Starting date: 12/14/17 Today's weight : 239 lbs Today's date: 04/22/2018 Total lbs lost to date: 14 At least 15 minutes were spent on discussing the following behavioral intervention visit.   ASK: We discussed the diagnosis of obesity with Autumn Curry today and Autumn Curry agreed to give [place] permission to discuss obesity behavioral modification therapy today.  ASSESS: Cassidey has the diagnosis of obesity and her BMI today is 43.7 Delenn is in the action stage of change   ADVISE: Tamina was educated on the multiple health risks of obesity as well as the benefit of weight loss to improve her health. She was advised of the need for long term treatment and the importance of lifestyle modifications to improve her current health and to decrease her risk of future health problems.  AGREE: Multiple dietary modification options and treatment options were discussed and  Shonnie agreed to follow the recommendations documented in the above note.  ARRANGE: Kindal was educated on the importance of frequent visits to treat obesity as outlined per CMS and USPSTF guidelines  and agreed to schedule her next follow up appointment today.  I, Trixie Dredge, am acting as transcriptionist for Ilene Qua, MD  I have reviewed the above documentation for accuracy and completeness, and I agree with the above. - Ilene Qua, MD

## 2018-05-10 ENCOUNTER — Ambulatory Visit (INDEPENDENT_AMBULATORY_CARE_PROVIDER_SITE_OTHER): Payer: Managed Care, Other (non HMO) | Admitting: Family Medicine

## 2018-05-10 VITALS — BP 138/83 | HR 80 | Temp 97.9°F | Ht 62.0 in | Wt 244.0 lb

## 2018-05-10 DIAGNOSIS — R7303 Prediabetes: Secondary | ICD-10-CM | POA: Diagnosis not present

## 2018-05-10 DIAGNOSIS — Z9189 Other specified personal risk factors, not elsewhere classified: Secondary | ICD-10-CM

## 2018-05-10 DIAGNOSIS — E559 Vitamin D deficiency, unspecified: Secondary | ICD-10-CM | POA: Diagnosis not present

## 2018-05-10 DIAGNOSIS — Z6841 Body Mass Index (BMI) 40.0 and over, adult: Secondary | ICD-10-CM

## 2018-05-10 DIAGNOSIS — I10 Essential (primary) hypertension: Secondary | ICD-10-CM

## 2018-05-10 MED ORDER — VITAMIN D (ERGOCALCIFEROL) 1.25 MG (50000 UNIT) PO CAPS: 50000.0000 [IU] | ORAL_CAPSULE | ORAL | 0 refills | Status: DC

## 2018-05-11 NOTE — Progress Notes (Signed)
Office: [phone]  /  Fax: [phone]   HPI:   Chief Complaint: OBESITY Autumn Curry is here to discuss her progress with her obesity treatment plan. She is on the Category 2 plan and is following her eating plan approximately 70 % of the time. She states she is doing cardio for 30 minutes 2-3 times per week. [place] has had insufficient funds to follow the meal plan. She is going grocery shopping today.  Her weight is 244 lb (110.7 kg) today and has gained 5 pounds since her last visit. She has lost 9 lbs since starting treatment with [place].  Vitamin D Deficiency Sheyann has a diagnosis of vitamin D deficiency. She is currently taking prescription Vit D. She notes fatigue and denies nausea, vomiting or muscle weakness.  At risk for osteopenia and osteoporosis Kennidi is at higher risk of osteopenia and osteoporosis due to vitamin D deficiency.   Hypertension JOYCELINE MAIORINO is a 37 y.o. female with hypertension. Sherhonda's blood pressure is controlled today. She denies chest pain, chest pressure, or headaches. She is working weight loss to help control her blood pressure with the goal of decreasing her risk of heart attack and stroke.   Pre-Diabetes Hasel has a diagnosis of pre-diabetes based on her elevated Hgb A1c and was informed this puts her at greater risk of developing diabetes. Her Hgb A1c is the same and insulin is slightly low. She is not taking metformin currently and continues to work on diet and exercise to decrease risk of diabetes. She denies nausea or hypoglycemia.  ALLERGIES: No Known Allergies  MEDICATIONS: Current Outpatient Medications on File Prior to Visit  Medication Sig Dispense Refill  . aspirin-acetaminophen-caffeine (EXCEDRIN MIGRAINE) 250-250-65 MG tablet Take by mouth every 6 (six) hours as needed for headache.    [name] atenolol (TENORMIN) 25 MG tablet Take 1 tablet (25 mg total) by mouth daily. 90 tablet 1  . atorvastatin (LIPITOR) 10 MG tablet Take 1  tablet (10 mg total) by mouth at bedtime. 30 tablet 2  . Darunavir-Cobicisctat-Emtricitabine-Tenofovir Alafenamide (SYMTUZA) 800-150-200-10 MG TABS Take 1 tablet by mouth daily with breakfast. 30 tablet 11  . eletriptan (RELPAX) 20 MG tablet Take 1 tablet (20 mg total) by mouth as needed for migraine or headache. One tablet by mouth at onset of headache. May repeat in 2 hours if headache persists or recurs. 10 tablet 0  . hydrochlorothiazide (HYDRODIURIL) 25 MG tablet TAKE 1 TABLET BY MOUTH ONCE DAILY 90 tablet 1  . Ibuprofen 200 MG CAPS Take by mouth.    . loratadine (CLARITIN) 10 MG tablet Take 10 mg by mouth daily.    . Multiple Vitamins-Minerals (ALIVE WOMENS ENERGY) TABS Take 1 tablet by mouth daily.    . Omega-3-6-9 CAPS Take by mouth daily.    . valACYclovir (VALTREX) 500 MG tablet Take 1 tablet (500 mg total) by mouth 2 (two) times daily. 10 tablet 5   No current facility-administered medications on file prior to visit.     PAST MEDICAL HISTORY: Past Medical History:  Diagnosis Date  . Absence of menstruation   . Carbuncle and furuncle of unspecified site   . Elevated blood pressure reading without diagnosis of hypertension   . Encounter for long-term (current) use of other medications   . Headache(784.0)   . HIV infection ([place]) 2008   dx AIDS  . Hyperlipidemia   . Hypertension   . Routine general medical examination at a health care facility   . Screening for malignant neoplasm  of the cervix   . Syphilis, unspecified     PAST SURGICAL HISTORY: No past surgical history on file.  SOCIAL HISTORY: Social History   Tobacco Use  . Smoking status: Never Smoker  . Smokeless tobacco: Never Used  Substance Use Topics  . Alcohol use: No    Alcohol/week: 0.0 standard drinks  . Drug use: No    FAMILY HISTORY: Family History  Problem Relation Age of Onset  . Hyperlipidemia Mother   . Hypertension Mother   . Depression Mother   . Coronary artery disease Father   .  Diabetes Father   . Heart disease Father   . Hyperlipidemia Father   . Stroke Father   . Kidney disease Father     ROS: Review of Systems  Constitutional: Positive for malaise/fatigue. Negative for weight loss.  Cardiovascular: Negative for chest pain.       Negative chest pressure  Gastrointestinal: Negative for nausea and vomiting.  Musculoskeletal:       Negative muscle weakness  Neurological: Negative for headaches.  Endo/Heme/Allergies:       Negative hypoglycemia    PHYSICAL EXAM: Blood pressure 138/83, pulse 80, temperature 97.9 F (36.6 C), temperature source Oral, height 5\' 2"  (1.575 m), weight 244 lb (110.7 kg), SpO2 99 %. Body mass index is 44.63 kg/m. Physical Exam  Constitutional: She is oriented to person, place, and time. She appears well-developed and well-nourished.  Cardiovascular: Normal rate.  Pulmonary/Chest: Effort normal.  Musculoskeletal: Normal range of motion.  Neurological: She is oriented to person, place, and time.  Skin: Skin is warm and dry.  Psychiatric: She has a normal mood and affect. Her behavior is normal.  Vitals reviewed.   RECENT LABS AND TESTS: BMET    Component Value Date/Time   NA 139 04/07/2018 1639   K 3.5 04/07/2018 1639   CL 101 04/07/2018 1639   CO2 29 04/07/2018 1639   GLUCOSE 90 04/07/2018 1639   BUN 16 04/07/2018 1639   CREATININE 0.80 04/07/2018 1639   CALCIUM 10.0 04/07/2018 1639   GFRNONAA 94 04/07/2018 1639   GFRAA 108 04/07/2018 1639   Lab Results  Component Value Date   HGBA1C 5.7 (H) 04/22/2018   HGBA1C 5.7 (H) 12/14/2017   HGBA1C 5.6 05/25/2017   Lab Results  Component Value Date   INSULIN 28.7 (H) 04/22/2018   INSULIN 31.4 (H) 12/14/2017   CBC    Component Value Date/Time   WBC 7.2 04/07/2018 1639   RBC 4.10 04/07/2018 1639   HGB 12.1 04/07/2018 1639   HGB 12.5 12/14/2017 1131   HCT 35.6 04/07/2018 1639   HCT 39.3 12/14/2017 1131   PLT 351 04/07/2018 1639   MCV 86.8 04/07/2018 1639    MCV 92 12/14/2017 1131   MCH 29.5 04/07/2018 1639   MCHC 34.0 04/07/2018 1639   RDW 12.7 04/07/2018 1639   RDW 14.0 12/14/2017 1131   LYMPHSABS 3,269 04/07/2018 1639   LYMPHSABS 3.6 (H) 12/14/2017 1131   MONOABS 348 06/12/2016 1545   EOSABS 86 04/07/2018 1639   EOSABS 0.2 12/14/2017 1131   BASOSABS 50 04/07/2018 1639   BASOSABS 0.0 12/14/2017 1131   Iron/TIBC/Ferritin/ %Sat No results found for: IRON, TIBC, FERRITIN, IRONPCTSAT Lipid Panel     Component Value Date/Time   CHOL 194 04/22/2018 1157   TRIG 103 04/22/2018 1157   HDL 35 (L) 04/22/2018 1157   CHOLHDL 6 01/26/2018 1814   VLDL 25.6 01/26/2018 1814   LDLCALC 138 (H) 04/22/2018 1157  LDLCALC 152 (H) 11/20/2017 1647   Hepatic Function Panel     Component Value Date/Time   PROT 7.4 04/07/2018 1639   ALBUMIN 3.9 01/26/2018 1814   AST 17 04/07/2018 1639   ALT 14 04/07/2018 1639   ALKPHOS 60 01/26/2018 1814   BILITOT 0.4 04/07/2018 1639      Component Value Date/Time   TSH 1.710 12/14/2017 1131   TSH 1.420 05/25/2017 0921   TSH 1.96 08/18/2016 1624  Results for ODESTER, NILSON (MRN [id number]) as of 05/11/2018 19:07  Ref. Range 04/22/2018 11:57  Vitamin D, 25-Hydroxy Latest Ref Range: 30.0 - 100.0 ng/mL 39.6    ASSESSMENT AND PLAN: Vitamin D deficiency - Plan: Vitamin D, Ergocalciferol, (DRISDOL) 50000 units CAPS capsule  Essential hypertension  Prediabetes  At risk for osteoporosis  Class 3 severe obesity with serious comorbidity and body mass index (BMI) of 40.0 to 44.9 in adult, unspecified obesity type ([place])  PLAN:  Vitamin D Deficiency Cheresa was informed that low vitamin D levels contributes to fatigue and are associated with obesity, breast, and colon cancer. Joslin agrees to continue taking prescription Vit D @50 ,000 IU every week #4 and we will refill for 1 month. She will follow up for routine testing of vitamin D, at least 2-3 times per year. She was informed of the risk of over-replacement  of vitamin D and agrees to not increase her dose unless she discusses this with [place] first. Claressa agrees to follow up with our clinic in 2 weeks.  At risk for osteopenia and osteoporosis Aylssa was given extended (15 minutes) osteoporosis prevention counseling today. Candas is at risk for osteopenia and osteoporsis due to her vitamin D deficiency. She was encouraged to take her vitamin D and follow her higher calcium diet and increase strengthening exercise to help strengthen her bones and decrease her risk of osteopenia and osteoporosis.  Hypertension We discussed sodium restriction, working on healthy weight loss, and a regular exercise program as the means to achieve improved blood pressure control. Cristianna agreed with this plan and agreed to follow up as directed. We will continue to monitor her blood pressure as well as her progress with the above lifestyle modifications. Vandana agrees to continue her current medications and will watch for signs of hypotension as she continues her lifestyle modifications. Yelitza agrees to follow up with our clinic in 2 weeks.  Pre-Diabetes Stacy will continue to work on weight loss, exercise, and decreasing simple carbohydrates in her diet to help decrease the risk of diabetes. We dicussed metformin including benefits and risks. She was informed that eating too many simple carbohydrates or too many calories at one sitting increases the likelihood of GI side effects. Emerald declined metformin for now and a prescription was not written today. We will recheck labs in 3 months. Lamiracle agrees to follow up with our clinic in 2 weeks as directed to monitor her progress.  Obesity Rashanda is currently in the action stage of change. As such, her goal is to continue with weight loss efforts She has agreed to follow the Category 2 plan Mireya has been instructed to work up to a goal of 150 minutes of combined cardio and strengthening exercise per week or physical  activity 3 times per week for the next 2 weeks for weight loss and overall health benefits. We discussed the following Behavioral Modification Strategies today: increasing lean protein intake, increasing vegetables, work on meal planning and easy cooking plans, and planning for success   Dabria has  agreed to follow up with our clinic in 2 weeks. She was informed of the importance of frequent follow up visits to maximize her success with intensive lifestyle modifications for her multiple health conditions.   OBESITY BEHAVIORAL INTERVENTION VISIT  Today's visit was # 9   Starting weight: 253 lbs Starting date: 12/14/17 Today's weight : 244 lbs  Today's date: 05/10/2018 Total lbs lost to date: 9    ASK: We discussed the diagnosis of obesity with Geryl Councilman today and Elmyra agreed to give [place] permission to discuss obesity behavioral modification therapy today.  ASSESS: Statia has the diagnosis of obesity and her BMI today is 44.62 Sadye is in the action stage of change   ADVISE: Nao was educated on the multiple health risks of obesity as well as the benefit of weight loss to improve her health. She was advised of the need for long term treatment and the importance of lifestyle modifications to improve her current health and to decrease her risk of future health problems.  AGREE: Multiple dietary modification options and treatment options were discussed and  Rayhana agreed to follow the recommendations documented in the above note.  ARRANGE: Jara was educated on the importance of frequent visits to treat obesity as outlined per CMS and USPSTF guidelines and agreed to schedule her next follow up appointment today.  I, Trixie Dredge, am acting as transcriptionist for Ilene Qua, MD  I have reviewed the above documentation for accuracy and completeness, and I agree with the above. - Ilene Qua, MD

## 2018-05-24 ENCOUNTER — Ambulatory Visit (INDEPENDENT_AMBULATORY_CARE_PROVIDER_SITE_OTHER): Payer: Managed Care, Other (non HMO) | Admitting: Family Medicine

## 2018-05-24 ENCOUNTER — Encounter: Payer: Self-pay | Admitting: Family Medicine

## 2018-05-24 VITALS — BP 122/77 | HR 66 | Temp 98.4°F | Resp 16 | Ht 62.0 in | Wt 248.8 lb

## 2018-05-24 DIAGNOSIS — Z0000 Encounter for general adult medical examination without abnormal findings: Secondary | ICD-10-CM

## 2018-05-24 DIAGNOSIS — I10 Essential (primary) hypertension: Secondary | ICD-10-CM

## 2018-05-24 MED ORDER — ATENOLOL 25 MG PO TABS: 25.0000 mg | ORAL_TABLET | Freq: Every day | ORAL | 1 refills | Status: DC

## 2018-05-24 NOTE — Patient Instructions (Signed)
Preventive Care 18-39 Years, Female Preventive care refers to lifestyle choices and visits with your health care provider that can promote health and wellness. What does preventive care include?  A yearly physical exam. This is also called an annual well check.  Dental exams once or twice a year.  Routine eye exams. Ask your health care provider how often you should have your eyes checked.  Personal lifestyle choices, including: ? Daily care of your teeth and gums. ? Regular physical activity. ? Eating a healthy diet. ? Avoiding tobacco and drug use. ? Limiting alcohol use. ? Practicing safe sex. ? Taking vitamin and mineral supplements as recommended by your health care provider. What happens during an annual well check? The services and screenings done by your health care provider during your annual well check will depend on your age, overall health, lifestyle risk factors, and family history of disease. Counseling Your health care provider may ask you questions about your:  Alcohol use.  Tobacco use.  Drug use.  Emotional well-being.  Home and relationship well-being.  Sexual activity.  Eating habits.  Work and work Statistician.  Method of birth control.  Menstrual cycle.  Pregnancy history.  Screening You may have the following tests or measurements:  Height, weight, and BMI.  Diabetes screening. This is done by checking your blood sugar (glucose) after you have not eaten for a while (fasting).  Blood pressure.  Lipid and cholesterol levels. These may be checked every 5 years starting at age 66.  Skin check.  Hepatitis C blood test.  Hepatitis B blood test.  Sexually transmitted disease (STD) testing.  BRCA-related cancer screening. This may be done if you have a family history of breast, ovarian, tubal, or peritoneal cancers.  Pelvic exam and Pap test. This may be done every 3 years starting at age 40. Starting at age 59, this may be done every 5  years if you have a Pap test in combination with an HPV test.  Discuss your test results, treatment options, and if necessary, the need for more tests with your health care provider. Vaccines Your health care provider may recommend certain vaccines, such as:  Influenza vaccine. This is recommended every year.  Tetanus, diphtheria, and acellular pertussis (Tdap, Td) vaccine. You may need a Td booster every 10 years.  Varicella vaccine. You may need this if you have not been vaccinated.  HPV vaccine. If you are 69 or younger, you may need three doses over 6 months.  Measles, mumps, and rubella (MMR) vaccine. You may need at least one dose of MMR. You may also need a second dose.  Pneumococcal 13-valent conjugate (PCV13) vaccine. You may need this if you have certain conditions and were not previously vaccinated.  Pneumococcal polysaccharide (PPSV23) vaccine. You may need one or two doses if you smoke cigarettes or if you have certain conditions.  Meningococcal vaccine. One dose is recommended if you are age 27-21 years and a first-year college student living in a residence hall, or if you have one of several medical conditions. You may also need additional booster doses.  Hepatitis A vaccine. You may need this if you have certain conditions or if you travel or work in places where you may be exposed to hepatitis A.  Hepatitis B vaccine. You may need this if you have certain conditions or if you travel or work in places where you may be exposed to hepatitis B.  Haemophilus influenzae type b (Hib) vaccine. You may need this if  you have certain risk factors.  Talk to your health care provider about which screenings and vaccines you need and how often you need them. This information is not intended to replace advice given to you by your health care provider. Make sure you discuss any questions you have with your health care provider. Document Released: 08/26/2001 Document Revised: 03/19/2016  Document Reviewed: 05/01/2015 Elsevier Interactive Patient Education  [employer].

## 2018-05-24 NOTE — Progress Notes (Signed)
Subjective:     Autumn Curry is a 37 y.o. female and is here for a comprehensive physical exam. The patient reports no problems.  Social History   Socioeconomic History  . Marital status: Married    Spouse name: Mignonne Afonso  . Number of children: 1  . Years of education: Not on file  . Highest education level: Not on file  Occupational History  . Occupation: Pharmacist, hospital    Comment: head start   Social Needs  . Financial resource strain: Not on file  . Food insecurity:    Worry: Not on file    Inability: Not on file  . Transportation needs:    Medical: Not on file    Non-medical: Not on file  Tobacco Use  . Smoking status: Never Smoker  . Smokeless tobacco: Never Used  Substance and Sexual Activity  . Alcohol use: No    Alcohol/week: 0.0 standard drinks  . Drug use: No  . Sexual activity: Yes    Partners: Male    Birth control/protection: Condom    Comment: given condoms  Lifestyle  . Physical activity:    Days per week: Not on file    Minutes per session: Not on file  . Stress: Not on file  Relationships  . Social connections:    Talks on phone: Not on file    Gets together: Not on file    Attends religious service: Not on file    Active member of club or organization: Not on file    Attends meetings of clubs or organizations: Not on file    Relationship status: Not on file  . Intimate partner violence:    Fear of current or ex partner: Not on file    Emotionally abused: Not on file    Physically abused: Not on file    Forced sexual activity: Not on file  Other Topics Concern  . Not on file  Social History Narrative  . Not on file   Health Maintenance  Topic Date Due  . PAP SMEAR  07/11/2017  . TETANUS/TDAP  07/11/2026  . INFLUENZA VACCINE  Completed  . HIV Screening  Completed    The following portions of the patient's history were reviewed and updated as appropriate: She  has a past medical history of Absence of menstruation, Carbuncle and furuncle  of unspecified site, Elevated blood pressure reading without diagnosis of hypertension, Encounter for long-term (current) use of other medications, Headache(784.0), HIV infection ([place]) (2008), Hyperlipidemia, Hypertension, Routine general medical examination at a health care facility, Screening for malignant neoplasm of the cervix, and Syphilis, unspecified. She does not have any pertinent problems on file. She  has no past surgical history on file. Her family history includes Coronary artery disease in her father; Depression in her mother; Diabetes in her father; Heart disease in her father; Hyperlipidemia in her father and mother; Hypertension in her mother; Kidney disease in her father; Stroke in her father. She  reports that she has never smoked. She has never used smokeless tobacco. She reports that she does not drink alcohol or use drugs. She has a current medication list which includes the following prescription(s): aspirin-acetaminophen-caffeine, atenolol, atorvastatin, darunavir-cobicisctat-emtricitabine-tenofovir alafenamide, eletriptan, hydrochlorothiazide, ibuprofen, loratadine, alive womens energy, omega-3-6-9, valacyclovir, and vitamin d (ergocalciferol). Current Outpatient Medications on File Prior to Visit  Medication Sig Dispense Refill  . aspirin-acetaminophen-caffeine (EXCEDRIN MIGRAINE) 250-250-65 MG tablet Take by mouth every 6 (six) hours as needed for headache.    [name] atorvastatin (LIPITOR)  10 MG tablet Take 1 tablet (10 mg total) by mouth at bedtime. 30 tablet 2  . Darunavir-Cobicisctat-Emtricitabine-Tenofovir Alafenamide (SYMTUZA) 800-150-200-10 MG TABS Take 1 tablet by mouth daily with breakfast. 30 tablet 11  . eletriptan (RELPAX) 20 MG tablet Take 1 tablet (20 mg total) by mouth as needed for migraine or headache. One tablet by mouth at onset of headache. May repeat in 2 hours if headache persists or recurs. 10 tablet 0  . hydrochlorothiazide (HYDRODIURIL) 25 MG tablet TAKE 1  TABLET BY MOUTH ONCE DAILY 90 tablet 1  . Ibuprofen 200 MG CAPS Take by mouth.    . loratadine (CLARITIN) 10 MG tablet Take 10 mg by mouth daily.    . Multiple Vitamins-Minerals (ALIVE WOMENS ENERGY) TABS Take 1 tablet by mouth daily.    . Omega-3-6-9 CAPS Take by mouth daily.    . valACYclovir (VALTREX) 500 MG tablet Take 1 tablet (500 mg total) by mouth 2 (two) times daily. 10 tablet 5  . Vitamin D, Ergocalciferol, (DRISDOL) 50000 units CAPS capsule Take 1 capsule (50,000 Units total) by mouth every 7 (seven) days. 4 capsule 0   No current facility-administered medications on file prior to visit.    She has No Known Allergies..  Review of Systems Review of Systems  Constitutional: Negative for activity change, appetite change and fatigue.  HENT: Negative for hearing loss, congestion, tinnitus and ear discharge.  dentist q54m Eyes: Negative for visual disturbance (see optho q1y -- vision corrected to 20/20 with glasses).  Respiratory: Negative for cough, chest tightness and shortness of breath.   Cardiovascular: Negative for chest pain, palpitations and leg swelling.  Gastrointestinal: Negative for abdominal pain, diarrhea, constipation and abdominal distention.  Genitourinary: Negative for urgency, frequency, decreased urine volume and difficulty urinating.  Musculoskeletal: Negative for back pain, arthralgias and gait problem.  Skin: Negative for color change, pallor and rash.  Neurological: Negative for dizziness, light-headedness, numbness and headaches.  Hematological: Negative for adenopathy. Does not bruise/bleed easily.  Psychiatric/Behavioral: Negative for suicidal ideas, confusion, sleep disturbance, self-injury, dysphoric mood, decreased concentration and agitation.      Objective:    BP 122/77 (BP Location: Left Arm, Cuff Size: Large)   Pulse 66   Temp 98.4 F (36.9 C) (Oral)   Resp 16   Ht 5\' 2"  (1.575 m)   Wt 248 lb 12.8 oz (112.9 kg)   LMP 05/24/2018   SpO2 99%    BMI 45.51 kg/m  General appearance: alert, cooperative, appears stated age and no distress Head: Normocephalic, without obvious abnormality, atraumatic Eyes: conjunctivae/corneas clear. PERRL, EOM's intact. Fundi benign. Ears: normal TM's and external ear canals both ears Nose: Nares normal. Septum midline. Mucosa normal. No drainage or sinus tenderness. Throat: lips, mucosa, and tongue normal; teeth and gums normal Neck: no adenopathy, no carotid bruit, no JVD, supple, symmetrical, trachea midline and thyroid not enlarged, symmetric, no tenderness/mass/nodules Back: symmetric, no curvature. ROM normal. No CVA tenderness. Lungs: clear to auscultation bilaterally Breasts: normal appearance, no masses or tenderness Heart: regular rate and rhythm, S1, S2 normal, no murmur, click, rub or gallop Abdomen: soft, non-tender; bowel sounds normal; no masses,  no organomegaly Pelvic: deferred-- pt on period Extremities: extremities normal, atraumatic, no cyanosis or edema Pulses: 2+ and symmetric Skin: Skin color, texture, turgor normal. No rashes or lesions Lymph nodes: Cervical, supraclavicular, and axillary nodes normal. Neurologic: Alert and oriented X 3, normal strength and tone. Normal symmetric reflexes. Normal coordination and gait    Assessment:  Healthy female exam.      Plan:  Check labs    ghm utd See After Visit Summary for Counseling Recommendations    1. Essential  Well controlled, no changes to meds. Encouraged heart healthy diet such as the DASH diet and exercise as tolerated.  - atenolol (TENORMIN) 25 MG tablet; Take 1 tablet (25 mg total) by mouth daily.  Dispense: 90 tablet; Refill: 1  2. Preventative health care See above

## 2018-05-26 ENCOUNTER — Ambulatory Visit (INDEPENDENT_AMBULATORY_CARE_PROVIDER_SITE_OTHER): Payer: Managed Care, Other (non HMO) | Admitting: Family Medicine

## 2018-05-26 VITALS — BP 121/82 | HR 72 | Temp 98.1°F | Ht 62.0 in | Wt 242.0 lb

## 2018-05-26 DIAGNOSIS — E559 Vitamin D deficiency, unspecified: Secondary | ICD-10-CM | POA: Diagnosis not present

## 2018-05-26 DIAGNOSIS — I10 Essential (primary) hypertension: Secondary | ICD-10-CM | POA: Diagnosis not present

## 2018-05-26 DIAGNOSIS — Z9189 Other specified personal risk factors, not elsewhere classified: Secondary | ICD-10-CM | POA: Diagnosis not present

## 2018-05-26 DIAGNOSIS — Z6841 Body Mass Index (BMI) 40.0 and over, adult: Secondary | ICD-10-CM

## 2018-05-26 MED ORDER — VITAMIN D (ERGOCALCIFEROL) 1.25 MG (50000 UNIT) PO CAPS: 50000.0000 [IU] | ORAL_CAPSULE | ORAL | 0 refills | Status: DC

## 2018-05-31 ENCOUNTER — Encounter: Payer: Self-pay | Admitting: Family Medicine

## 2018-05-31 ENCOUNTER — Ambulatory Visit (INDEPENDENT_AMBULATORY_CARE_PROVIDER_SITE_OTHER): Payer: Managed Care, Other (non HMO) | Admitting: Family Medicine

## 2018-05-31 ENCOUNTER — Other Ambulatory Visit (HOSPITAL_COMMUNITY)
Admission: RE | Admit: 2018-05-31 | Discharge: 2018-05-31 | Disposition: A | Discharge status: Home / Self Care | Payer: Managed Care, Other (non HMO) | Source: Ambulatory Visit | Attending: Family Medicine | Admitting: Family Medicine

## 2018-05-31 VITALS — BP 116/73 | HR 74 | Temp 98.7°F | Resp 16 | Ht 62.0 in | Wt 244.8 lb

## 2018-05-31 DIAGNOSIS — Z124 Encounter for screening for malignant neoplasm of cervix: Secondary | ICD-10-CM | POA: Insufficient documentation

## 2018-05-31 NOTE — Progress Notes (Signed)
Patient ID: BLAIZE NIPPER, female    DOB: February 22, 1981  Age: 37 y.o. MRN: [id number]    Subjective:  Subjective  HPI Autumn Curry presents for pap only.   Rest of the cpe done previously   Review of Systems  Constitutional: Negative for chills and fever.  HENT: Negative for congestion and hearing loss.   Eyes: Negative for discharge.  Respiratory: Negative for cough and shortness of breath.   Cardiovascular: Negative for chest pain, palpitations and leg swelling.  Gastrointestinal: Negative for abdominal pain, blood in stool, constipation, diarrhea, nausea and vomiting.  Genitourinary: Negative for dysuria, frequency, hematuria and urgency.  Musculoskeletal: Negative for back pain and myalgias.  Skin: Negative for rash.  Allergic/Immunologic: Negative for environmental allergies.  Neurological: Negative for dizziness, weakness and headaches.  Hematological: Does not bruise/bleed easily.  Psychiatric/Behavioral: Negative for suicidal ideas. The patient is not nervous/anxious.     History Past Medical History:  Diagnosis Date  . Absence of menstruation   . Carbuncle and furuncle of unspecified site   . Elevated blood pressure reading without diagnosis of hypertension   . Encounter for long-term (current) use of other medications   . Headache(784.0)   . 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 Hyperlipidemia   . Hypertension   . Routine general medical examination at a health care facility   . Screening for malignant neoplasm of the cervix   . Syphilis, unspecified     She has no past surgical history on file.   Her family history includes Coronary artery disease in her father; Depression in her mother; Diabetes in her father; Heart disease in her father; Hyperlipidemia in her father and mother; Hypertension in her mother; Kidney disease in her father; Stroke in her father.She reports that she has never smoked. She has never used smokeless tobacco. She reports that she does  not drink alcohol or use drugs.  Current Outpatient Medications on File Prior to Visit  Medication Sig Dispense Refill  . aspirin-acetaminophen-caffeine (EXCEDRIN MIGRAINE) 250-250-65 MG tablet Take by mouth every 6 (six) hours as needed for headache.    [name] atenolol (TENORMIN) 25 MG tablet Take 1 tablet (25 mg total) by mouth daily. 90 tablet 1  . Darunavir-Cobicisctat-Emtricitabine-Tenofovir Alafenamide (SYMTUZA) 800-150-200-10 MG TABS Take 1 tablet by mouth daily with breakfast. 30 tablet 11  . eletriptan (RELPAX) 20 MG tablet Take 1 tablet (20 mg total) by mouth as needed for migraine or headache. One tablet by mouth at onset of headache. May repeat in 2 hours if headache persists or recurs. 10 tablet 0  . hydrochlorothiazide (HYDRODIURIL) 25 MG tablet TAKE 1 TABLET BY MOUTH ONCE DAILY 90 tablet 1  . Ibuprofen 200 MG CAPS Take by mouth.    . loratadine (CLARITIN) 10 MG tablet Take 10 mg by mouth daily.    . Multiple Vitamins-Minerals (ALIVE WOMENS ENERGY) TABS Take 1 tablet by mouth daily.    . Omega-3-6-9 CAPS Take by mouth daily.    . valACYclovir (VALTREX) 500 MG tablet Take 1 tablet (500 mg total) by mouth 2 (two) times daily. 10 tablet 5  . Vitamin D, Ergocalciferol, (DRISDOL) 1.25 MG (50000 UT) CAPS capsule Take 1 capsule (50,000 Units total) by mouth every 7 (seven) days. 4 capsule 0   No current facility-administered medications on file prior to visit.      Objective:  Objective  Physical Exam  Genitourinary: Pelvic exam was performed with patient supine. There is no rash, tenderness, lesion or  injury on the right labia. There is no rash, tenderness, lesion or injury on the left labia.  Genitourinary Comments: Pap done  No vag d/c  Ext genitalia normal  BME-- no cmt, no adenexal pain, fullness  Nursing note and vitals reviewed.  BP 116/73 (BP Location: Left Arm, Cuff Size: Large)   Pulse 74   Temp 98.7 F (37.1 C) (Oral)   Resp 16   Ht 5\' 2"  (1.575 m)   Wt 244 lb 12.8 oz  (111 kg)   LMP 05/24/2018   SpO2 100%   BMI 44.77 kg/m  Wt Readings from Last 3 Encounters:  05/31/18 244 lb 12.8 oz (111 kg)  05/26/18 242 lb (109.8 kg)  05/24/18 248 lb 12.8 oz (112.9 kg)     Lab Results  Component Value Date   WBC 7.2 04/07/2018   HGB 12.1 04/07/2018   HCT 35.6 04/07/2018   PLT 351 04/07/2018   GLUCOSE 90 04/07/2018   CHOL 194 04/22/2018   TRIG 103 04/22/2018   HDL 35 (L) 04/22/2018   LDLCALC 138 (H) 04/22/2018   ALT 14 04/07/2018   AST 17 04/07/2018   NA 139 04/07/2018   K 3.5 04/07/2018   CL 101 04/07/2018   CREATININE 0.80 04/07/2018   BUN 16 04/07/2018   CO2 29 04/07/2018   TSH 1.710 12/14/2017   HGBA1C 5.7 (H) 04/22/2018    No results found.   Assessment & Plan:  Plan  I am having Autumn Chretien. Curry maintain her ALIVE WOMENS ENERGY, eletriptan, valACYclovir, loratadine, Omega-3-6-9, aspirin-acetaminophen-caffeine, Ibuprofen, hydrochlorothiazide, Darunavir-Cobicisctat-Emtricitabine-Tenofovir Alafenamide, atenolol, and Vitamin D (Ergocalciferol).  No orders of the defined types were placed in this encounter.   Problem List Items Addressed This Visit    None    Visit Diagnoses    Cervical cancer screening    -  Primary   Relevant Orders   Cytology - PAP( [facility])      Follow-up: Return in about 6 months (around 11/29/2018) for hypertension, hyperlipidemia.  Ann Held, DO

## 2018-05-31 NOTE — Patient Instructions (Signed)

## 2018-06-01 NOTE — Progress Notes (Signed)
Office: [phone]  /  Fax: [phone]   HPI:   Chief Complaint: OBESITY Autumn Curry is here to discuss her progress with her obesity treatment plan. She is on the  follow the Category 2 plan and is following her eating plan approximately 80 % of the time. She states she is exercising by doing cardio for 20-30 minutes 3 times per week. Autumn Curry had two weeks out of town, but made good choices when not in town. Travel in the next 2 weeks is minimal. She has having Thanksgiving at her house states she is making sides she can eat.  Her weight is 242 lb (109.8 kg) today and has had a weight loss of 2 pounds over a period of 2 weeks since her last visit. She has lost 11 lbs since starting treatment with [place].  Vitamin D deficiency Autumn Curry has a diagnosis of vitamin D deficiency. She is currently taking vit D and denies nausea, vomiting or muscle weakness. She reports fatigue.   Ref. Range 04/22/2018 11:57  Vitamin D, 25-Hydroxy Latest Ref Range: 30.0 - 100.0 ng/mL 39.6   Hypertension Autumn Curry is a 37 y.o. female with hypertension.  Autumn Curry denies chest pain/pressure, headaches, or shortness of breath on exertion. She is working weight loss to help control her blood pressure with the goal of decreasing her risk of heart attack and stroke. Autumn Curry blood pressure is currently controlled.  At risk for osteopenia and osteoporosis Autumn Curry is at higher risk of osteopenia and osteoporosis due to vitamin D deficiency.   ALLERGIES: No Known Allergies  MEDICATIONS: Current Outpatient Medications on File Prior to Visit  Medication Sig Dispense Refill  . aspirin-acetaminophen-caffeine (EXCEDRIN MIGRAINE) 250-250-65 MG tablet Take by mouth every 6 (six) hours as needed for headache.    [name] atenolol (TENORMIN) 25 MG tablet Take 1 tablet (25 mg total) by mouth daily. 90 tablet 1  . Darunavir-Cobicisctat-Emtricitabine-Tenofovir Alafenamide (SYMTUZA) 800-150-200-10 MG TABS Take 1 tablet by  mouth daily with breakfast. 30 tablet 11  . eletriptan (RELPAX) 20 MG tablet Take 1 tablet (20 mg total) by mouth as needed for migraine or headache. One tablet by mouth at onset of headache. May repeat in 2 hours if headache persists or recurs. 10 tablet 0  . hydrochlorothiazide (HYDRODIURIL) 25 MG tablet TAKE 1 TABLET BY MOUTH ONCE DAILY 90 tablet 1  . Ibuprofen 200 MG CAPS Take by mouth.    . loratadine (CLARITIN) 10 MG tablet Take 10 mg by mouth daily.    . Multiple Vitamins-Minerals (ALIVE WOMENS ENERGY) TABS Take 1 tablet by mouth daily.    . Omega-3-6-9 CAPS Take by mouth daily.    . valACYclovir (VALTREX) 500 MG tablet Take 1 tablet (500 mg total) by mouth 2 (two) times daily. 10 tablet 5   No current facility-administered medications on file prior to visit.     PAST MEDICAL HISTORY: Past Medical History:  Diagnosis Date  . Absence of menstruation   . Carbuncle and furuncle of unspecified site   . Elevated blood pressure reading without diagnosis of hypertension   . Encounter for long-term (current) use of other medications   . Headache(784.0)   . HIV infection ([place]) 2008   dx AIDS  . Hyperlipidemia   . Hypertension   . Routine general medical examination at a health care facility   . Screening for malignant neoplasm of the cervix   . Syphilis, unspecified     PAST SURGICAL HISTORY: No past surgical history on file.  SOCIAL HISTORY: Social History   Tobacco Use  . Smoking status: Never Smoker  . Smokeless tobacco: Never Used  Substance Use Topics  . Alcohol use: No    Alcohol/week: 0.0 standard drinks  . Drug use: No    FAMILY HISTORY: Family History  Problem Relation Age of Onset  . Hyperlipidemia Mother   . Hypertension Mother   . Depression Mother   . Coronary artery disease Father   . Diabetes Father   . Heart disease Father   . Hyperlipidemia Father   . Stroke Father   . Kidney disease Father     ROS: Review of Systems  Constitutional: Positive  for malaise/fatigue and weight loss.  Respiratory: Negative for shortness of breath.   Cardiovascular: Negative for chest pain.       Negative for chest pressure  Gastrointestinal: Negative for nausea and vomiting.  Musculoskeletal:       Negative for muscle weakness  Neurological: Negative for headaches.    PHYSICAL EXAM: Blood pressure 121/82, pulse 72, temperature 98.1 F (36.7 C), temperature source Oral, height 5\' 2"  (1.575 m), weight 242 lb (109.8 kg), last menstrual period 05/24/2018, SpO2 98 %. Body mass index is 44.26 kg/m. Physical Exam  Constitutional: She is oriented to person, place, and time. She appears well-developed and well-nourished.  HENT:  Head: Normocephalic.  Neck: Normal range of motion.  Cardiovascular: Normal rate.  Pulmonary/Chest: Effort normal.  Musculoskeletal: Normal range of motion.  Neurological: She is alert and oriented to person, place, and time.  Skin: Skin is warm and dry.  Psychiatric: She has a normal mood and affect. Her behavior is normal.  Vitals reviewed.   RECENT LABS AND TESTS: BMET    Component Value Date/Time   NA 139 04/07/2018 1639   K 3.5 04/07/2018 1639   CL 101 04/07/2018 1639   CO2 29 04/07/2018 1639   GLUCOSE 90 04/07/2018 1639   BUN 16 04/07/2018 1639   CREATININE 0.80 04/07/2018 1639   CALCIUM 10.0 04/07/2018 1639   GFRNONAA 94 04/07/2018 1639   GFRAA 108 04/07/2018 1639   Lab Results  Component Value Date   HGBA1C 5.7 (H) 04/22/2018   HGBA1C 5.7 (H) 12/14/2017   HGBA1C 5.6 05/25/2017   Lab Results  Component Value Date   INSULIN 28.7 (H) 04/22/2018   INSULIN 31.4 (H) 12/14/2017   CBC    Component Value Date/Time   WBC 7.2 04/07/2018 1639   RBC 4.10 04/07/2018 1639   HGB 12.1 04/07/2018 1639   HGB 12.5 12/14/2017 1131   HCT 35.6 04/07/2018 1639   HCT 39.3 12/14/2017 1131   PLT 351 04/07/2018 1639   MCV 86.8 04/07/2018 1639   MCV 92 12/14/2017 1131   MCH 29.5 04/07/2018 1639   MCHC 34.0  04/07/2018 1639   RDW 12.7 04/07/2018 1639   RDW 14.0 12/14/2017 1131   LYMPHSABS 3,269 04/07/2018 1639   LYMPHSABS 3.6 (H) 12/14/2017 1131   MONOABS 348 06/12/2016 1545   EOSABS 86 04/07/2018 1639   EOSABS 0.2 12/14/2017 1131   BASOSABS 50 04/07/2018 1639   BASOSABS 0.0 12/14/2017 1131   Iron/TIBC/Ferritin/ %Sat No results found for: IRON, TIBC, FERRITIN, IRONPCTSAT Lipid Panel     Component Value Date/Time   CHOL 194 04/22/2018 1157   TRIG 103 04/22/2018 1157   HDL 35 (L) 04/22/2018 1157   CHOLHDL 6 01/26/2018 1814   VLDL 25.6 01/26/2018 1814   LDLCALC 138 (H) 04/22/2018 1157   LDLCALC 152 (H) 11/20/2017 1647  Hepatic Function Panel     Component Value Date/Time   PROT 7.4 04/07/2018 1639   ALBUMIN 3.9 01/26/2018 1814   AST 17 04/07/2018 1639   ALT 14 04/07/2018 1639   ALKPHOS 60 01/26/2018 1814   BILITOT 0.4 04/07/2018 1639      Component Value Date/Time   TSH 1.710 12/14/2017 1131   TSH 1.420 05/25/2017 0921   TSH 1.96 08/18/2016 1624    Ref. Range 04/22/2018 11:57  Vitamin D, 25-Hydroxy Latest Ref Range: 30.0 - 100.0 ng/mL 39.6    ASSESSMENT AND PLAN: Vitamin D deficiency - Plan: Vitamin D, Ergocalciferol, (DRISDOL) 1.25 MG (50000 UT) CAPS capsule  Essential hypertension  At risk for osteoporosis  Class 3 severe obesity with serious comorbidity and body mass index (BMI) of 40.0 to 44.9 in adult, unspecified obesity type ([place])  PLAN: Vitamin D Deficiency Autumn Curry was informed that low vitamin D levels contributes to fatigue and are associated with obesity, breast, and colon cancer. She agrees to continue to take prescription Vit D @50 ,000 IU every week #4 with no refills and will follow up for routine testing of vitamin D, at least 2-3 times per year. She was informed of the risk of over-replacement of vitamin D and agrees to not increase her dose unless she discusses this with [place] first. Agrees to follow up with our clinic as directed.   Hypertension We  discussed sodium restriction, working on healthy weight loss, and a regular exercise program as the means to achieve improved blood pressure control. Autumn Curry agreed with this plan and agreed to follow up as directed. We will continue to monitor her blood pressure as well as her progress with the above lifestyle modifications. She will continue her medications as prescribed and will watch for signs of hypotension as she continues her lifestyle modifications. Agrees to follow up with our clinic as directed.   At risk for osteopenia and osteoporosis Autumn Curry was given extended  (15 minutes) osteoporosis prevention counseling today. Autumn Curry is at risk for osteopenia and osteoporosis due to her vitamin D deficiency. She was encouraged to take her vitamin D and follow her higher calcium diet and increase strengthening exercise to help strengthen her bones and decrease her risk of osteopenia and osteoporosis.  Obesity Autumn Curry is currently in the action stage of change. As such, her goal is to continue with weight loss efforts She has agreed to follow the Category 2 plan Autumn Curry has been instructed to work up to a goal of 150 minutes of combined cardio and strengthening exercise per week for weight loss and overall health benefits. We discussed the following Behavioral Modification Strategies today: increasing lean protein intake, increasing vegetables, and planning for success, and work on meal planning and easy cooking plans   Autumn Curry has agreed to follow up with our clinic in 2 weeks. She was informed of the importance of frequent follow up visits to maximize her success with intensive lifestyle modifications for her multiple health conditions.   OBESITY BEHAVIORAL INTERVENTION VISIT  Today's visit was # 10   Starting weight: 253 lb Starting date: 12/14/17 Today's weight : Weight: 242 lb (109.8 kg)  Today's date: 05/26/18 Total lbs lost to date: 11 lb    ASK: We discussed the diagnosis of  obesity with Autumn Curry today and Autumn Curry agreed to give [place] permission to discuss obesity behavioral modification therapy today.  ASSESS: Autumn Curry has the diagnosis of obesity and her BMI today is 44.76 Autumn Curry is in the action stage of  change   ADVISE: Autumn Curry was educated on the multiple health risks of obesity as well as the benefit of weight loss to improve her health. She was advised of the need for long term treatment and the importance of lifestyle modifications to improve her current health and to decrease her risk of future health problems.  AGREE: Multiple dietary modification options and treatment options were discussed and  Autumn Curry agreed to follow the recommendations documented in the above note.  ARRANGE: Autumn Curry was educated on the importance of frequent visits to treat obesity as outlined per CMS and USPSTF guidelines and agreed to schedule her next follow up appointment today.  I, Renee Ramus, am acting as transcriptionist for Ilene Qua, MD   I have reviewed the above documentation for accuracy and completeness, and I agree with the above. - Ilene Qua, MD

## 2018-06-02 LAB — CYTOLOGY - PAP
DIAGNOSIS: NEGATIVE
HPV (WINDOPATH): NOT DETECTED

## 2018-06-03 ENCOUNTER — Encounter: Payer: Self-pay | Admitting: *Deleted

## 2018-06-09 ENCOUNTER — Ambulatory Visit (INDEPENDENT_AMBULATORY_CARE_PROVIDER_SITE_OTHER): Payer: Managed Care, Other (non HMO) | Admitting: Family Medicine

## 2018-06-09 VITALS — BP 97/61 | HR 72 | Temp 97.9°F | Ht 62.0 in | Wt 240.0 lb

## 2018-06-09 DIAGNOSIS — Z9189 Other specified personal risk factors, not elsewhere classified: Secondary | ICD-10-CM | POA: Diagnosis not present

## 2018-06-09 DIAGNOSIS — Z6841 Body Mass Index (BMI) 40.0 and over, adult: Secondary | ICD-10-CM

## 2018-06-09 DIAGNOSIS — E559 Vitamin D deficiency, unspecified: Secondary | ICD-10-CM | POA: Diagnosis not present

## 2018-06-09 DIAGNOSIS — I10 Essential (primary) hypertension: Secondary | ICD-10-CM | POA: Diagnosis not present

## 2018-06-09 MED ORDER — VITAMIN D (ERGOCALCIFEROL) 1.25 MG (50000 UNIT) PO CAPS: 50000.0000 [IU] | ORAL_CAPSULE | ORAL | 0 refills | Status: DC

## 2018-06-16 NOTE — Progress Notes (Signed)
Office: [phone]  /  Fax: [phone]   HPI:   Chief Complaint: OBESITY Autumn Curry is here to discuss her progress with her obesity treatment plan. She is on the Category 2 plan and is following her eating plan approximately 80 % of the time. She states she is exercising 0 minutes 0 times per week. Salah is spending Thanksgiving at a patient's house, and she is cooking. She has no obstacles after Thanksgiving. Julane has no hunger at non meal times (getting in approximately 6 ounces of meat at night). Her weight is 240 lb (108.9 kg) today and has had a weight loss of 2 pounds over a period of 2 weeks since her last visit. She has lost 13 lbs since starting treatment with [place].  Vitamin D deficiency Nialah has a diagnosis of vitamin D deficiency. Espyn is currently taking prescription vit D and she admits fatigue. She denies nausea, vomiting or muscle weakness.  At risk for osteopenia and osteoporosis Elverta is at higher risk of osteopenia and osteoporosis due to vitamin D deficiency.   Hypertension CLAIRA JETER is a 37 y.o. female with hypertension. WELDA AZZARELLO denies chest pain, chest pressure or headache. She is working weight loss to help control her blood pressure with the goal of decreasing her risk of heart attack and stroke. [name] blood pressure is currently controlled.  ALLERGIES: No Known Allergies  MEDICATIONS: Current Outpatient Medications on File Prior to Visit  Medication Sig Dispense Refill  . aspirin-acetaminophen-caffeine (EXCEDRIN MIGRAINE) 250-250-65 MG tablet Take by mouth every 6 (six) hours as needed for headache.    [name] atenolol (TENORMIN) 25 MG tablet Take 1 tablet (25 mg total) by mouth daily. 90 tablet 1  . Darunavir-Cobicisctat-Emtricitabine-Tenofovir Alafenamide (SYMTUZA) 800-150-200-10 MG TABS Take 1 tablet by mouth daily with breakfast. 30 tablet 11  . eletriptan (RELPAX) 20 MG tablet Take 1 tablet (20 mg total) by mouth as needed for  migraine or headache. One tablet by mouth at onset of headache. May repeat in 2 hours if headache persists or recurs. 10 tablet 0  . hydrochlorothiazide (HYDRODIURIL) 25 MG tablet TAKE 1 TABLET BY MOUTH ONCE DAILY 90 tablet 1  . Ibuprofen 200 MG CAPS Take by mouth.    . loratadine (CLARITIN) 10 MG tablet Take 10 mg by mouth daily.    . Multiple Vitamins-Minerals (ALIVE WOMENS ENERGY) TABS Take 1 tablet by mouth daily.    . Omega-3-6-9 CAPS Take by mouth daily.    . valACYclovir (VALTREX) 500 MG tablet Take 1 tablet (500 mg total) by mouth 2 (two) times daily. 10 tablet 5   No current facility-administered medications on file prior to visit.     PAST MEDICAL HISTORY: Past Medical History:  Diagnosis Date  . Absence of menstruation   . Carbuncle and furuncle of unspecified site   . Elevated blood pressure reading without diagnosis of hypertension   . Encounter for long-term (current) use of other medications   . Headache(784.0)   . HIV infection ([place]) 2008   dx AIDS  . Hyperlipidemia   . Hypertension   . Routine general medical examination at a health care facility   . Screening for malignant neoplasm of the cervix   . Syphilis, unspecified     PAST SURGICAL HISTORY: No past surgical history on file.  SOCIAL HISTORY: Social History   Tobacco Use  . Smoking status: Never Smoker  . Smokeless tobacco: Never Used  Substance Use Topics  . Alcohol use: No  Alcohol/week: 0.0 standard drinks  . Drug use: No    FAMILY HISTORY: Family History  Problem Relation Age of Onset  . Hyperlipidemia Mother   . Hypertension Mother   . Depression Mother   . Coronary artery disease Father   . Diabetes Father   . Heart disease Father   . Hyperlipidemia Father   . Stroke Father   . Kidney disease Father     ROS: Review of Systems  Constitutional: Positive for malaise/fatigue and weight loss.  Cardiovascular: Negative for chest pain.       Negative for chest pressure    Gastrointestinal: Negative for nausea and vomiting.  Musculoskeletal:       Negative for muscle weakness  Neurological: Negative for headaches.    PHYSICAL EXAM: Blood pressure 97/61, pulse 72, temperature 97.9 F (36.6 C), temperature source Oral, height 5\' 2"  (1.575 m), weight 240 lb (108.9 kg), last menstrual period 05/24/2018, SpO2 97 %. Body mass index is 43.9 kg/m. Physical Exam  Constitutional: She is oriented to person, place, and time. She appears well-developed and well-nourished.  Cardiovascular: Normal rate.  Pulmonary/Chest: Effort normal.  Musculoskeletal: Normal range of motion.  Neurological: She is oriented to person, place, and time.  Skin: Skin is warm and dry.  Psychiatric: She has a normal mood and affect. Her behavior is normal.  Vitals reviewed.   RECENT LABS AND TESTS: BMET    Component Value Date/Time   NA 139 04/07/2018 1639   K 3.5 04/07/2018 1639   CL 101 04/07/2018 1639   CO2 29 04/07/2018 1639   GLUCOSE 90 04/07/2018 1639   BUN 16 04/07/2018 1639   CREATININE 0.80 04/07/2018 1639   CALCIUM 10.0 04/07/2018 1639   GFRNONAA 94 04/07/2018 1639   GFRAA 108 04/07/2018 1639   Lab Results  Component Value Date   HGBA1C 5.7 (H) 04/22/2018   HGBA1C 5.7 (H) 12/14/2017   HGBA1C 5.6 05/25/2017   Lab Results  Component Value Date   INSULIN 28.7 (H) 04/22/2018   INSULIN 31.4 (H) 12/14/2017   CBC    Component Value Date/Time   WBC 7.2 04/07/2018 1639   RBC 4.10 04/07/2018 1639   HGB 12.1 04/07/2018 1639   HGB 12.5 12/14/2017 1131   HCT 35.6 04/07/2018 1639   HCT 39.3 12/14/2017 1131   PLT 351 04/07/2018 1639   MCV 86.8 04/07/2018 1639   MCV 92 12/14/2017 1131   MCH 29.5 04/07/2018 1639   MCHC 34.0 04/07/2018 1639   RDW 12.7 04/07/2018 1639   RDW 14.0 12/14/2017 1131   LYMPHSABS 3,269 04/07/2018 1639   LYMPHSABS 3.6 (H) 12/14/2017 1131   MONOABS 348 06/12/2016 1545   EOSABS 86 04/07/2018 1639   EOSABS 0.2 12/14/2017 1131   BASOSABS  50 04/07/2018 1639   BASOSABS 0.0 12/14/2017 1131   Iron/TIBC/Ferritin/ %Sat No results found for: IRON, TIBC, FERRITIN, IRONPCTSAT Lipid Panel     Component Value Date/Time   CHOL 194 04/22/2018 1157   TRIG 103 04/22/2018 1157   HDL 35 (L) 04/22/2018 1157   CHOLHDL 6 01/26/2018 1814   VLDL 25.6 01/26/2018 1814   LDLCALC 138 (H) 04/22/2018 1157   LDLCALC 152 (H) 11/20/2017 1647   Hepatic Function Panel     Component Value Date/Time   PROT 7.4 04/07/2018 1639   ALBUMIN 3.9 01/26/2018 1814   AST 17 04/07/2018 1639   ALT 14 04/07/2018 1639   ALKPHOS 60 01/26/2018 1814   BILITOT 0.4 04/07/2018 1639  Component Value Date/Time   TSH 1.710 12/14/2017 1131   TSH 1.420 05/25/2017 0921   TSH 1.96 08/18/2016 1624    Ref. Range 04/22/2018 11:57  Vitamin D, 25-Hydroxy Latest Ref Range: 30.0 - 100.0 ng/mL 39.6   ASSESSMENT AND PLAN: Vitamin D deficiency - Plan: Vitamin D, Ergocalciferol, (DRISDOL) 1.25 MG (50000 UT) CAPS capsule  Essential hypertension  At risk for osteoporosis  Class 3 severe obesity with serious comorbidity and body mass index (BMI) of 40.0 to 44.9 in adult, unspecified obesity type ([place])  PLAN:  Vitamin D Deficiency Simra was informed that low vitamin D levels contributes to fatigue and are associated with obesity, breast, and colon cancer. She agrees to continue to take prescription Vit D @50 ,000 IU every week #4 with no refills and will follow up for routine testing of vitamin D, at least 2-3 times per year. She was informed of the risk of over-replacement of vitamin D and agrees to not increase her dose unless she discusses this with [place] first. Airanna agrees to follow up as directed.  At risk for osteopenia and osteoporosis Fernande was given extended  (15 minutes) osteoporosis prevention counseling today. Keyuna is at risk for osteopenia and osteoporosis due to her vitamin D deficiency. She was encouraged to take her vitamin D and follow her higher  calcium diet and increase strengthening exercise to help strengthen her bones and decrease her risk of osteopenia and osteoporosis.  Hypertension We discussed sodium restriction, working on healthy weight loss, and a regular exercise program as the means to achieve improved blood pressure control. Vannah agreed with this plan and agreed to follow up as directed. We will continue to monitor her blood pressure as well as her progress with the above lifestyle modifications. She will continue her medications as prescribed and will watch for signs of hypotension as she continues her lifestyle modifications.  Obesity Yvonnia is currently in the action stage of change. As such, her goal is to continue with weight loss efforts She has agreed to follow the Category 2 plan Tonji has been instructed to work up to a goal of 150 minutes of combined cardio and strengthening exercise per week for weight loss and overall health benefits. We discussed the following Behavioral Modification Strategies today: planning for success, increasing lean protein intake, increasing vegetables, work on meal planning and easy cooking plans and holiday eating strategies    Andriana has agreed to follow up with our clinic in 2 weeks. She was informed of the importance of frequent follow up visits to maximize her success with intensive lifestyle modifications for her multiple health conditions.   OBESITY BEHAVIORAL INTERVENTION VISIT  Today's visit was # 11   Starting weight: 253 lbs Starting date: 12/14/2017 Today's weight : 240 lbs Today's date: 06/09/2018 Total lbs lost to date: 13   ASK: We discussed the diagnosis of obesity with Geryl Councilman today and Laisa agreed to give [place] permission to discuss obesity behavioral modification therapy today.  ASSESS: Tonique has the diagnosis of obesity and her BMI today is 43.89 Svea is in the action stage of change   ADVISE: Carlin was educated on the multiple  health risks of obesity as well as the benefit of weight loss to improve her health. She was advised of the need for long term treatment and the importance of lifestyle modifications to improve her current health and to decrease her risk of future health problems.  AGREE: Multiple dietary modification options and treatment options were discussed  and  Aiya agreed to follow the recommendations documented in the above note.  ARRANGE: Adela was educated on the importance of frequent visits to treat obesity as outlined per CMS and USPSTF guidelines and agreed to schedule her next follow up appointment today.  I, Doreene Nest, am acting as transcriptionist for Eber Jones, MD  I have reviewed the above documentation for accuracy and completeness, and I agree with the above. - Ilene Qua, MD

## 2018-06-23 ENCOUNTER — Ambulatory Visit (INDEPENDENT_AMBULATORY_CARE_PROVIDER_SITE_OTHER): Payer: Managed Care, Other (non HMO) | Admitting: Physician Assistant

## 2018-06-23 ENCOUNTER — Encounter (INDEPENDENT_AMBULATORY_CARE_PROVIDER_SITE_OTHER): Payer: Self-pay | Admitting: Physician Assistant

## 2018-06-23 VITALS — BP 139/77 | HR 78 | Temp 98.1°F | Ht 62.0 in | Wt 246.0 lb

## 2018-06-23 DIAGNOSIS — Z6841 Body Mass Index (BMI) 40.0 and over, adult: Secondary | ICD-10-CM

## 2018-06-23 DIAGNOSIS — Z9189 Other specified personal risk factors, not elsewhere classified: Secondary | ICD-10-CM | POA: Diagnosis not present

## 2018-06-23 DIAGNOSIS — E559 Vitamin D deficiency, unspecified: Secondary | ICD-10-CM

## 2018-06-23 DIAGNOSIS — R7303 Prediabetes: Secondary | ICD-10-CM

## 2018-06-23 MED ORDER — VITAMIN D (ERGOCALCIFEROL) 1.25 MG (50000 UNIT) PO CAPS: 50000.0000 [IU] | ORAL_CAPSULE | ORAL | 0 refills | Status: DC

## 2018-06-23 NOTE — Progress Notes (Signed)
Office: [phone]  /  Fax: [phone]   HPI:   Chief Complaint: OBESITY Autumn Curry is here to discuss her progress with her obesity treatment plan. She is on the  follow the Category 2 plan and is following her eating plan approximately 85 % of the time. She states she is exercising by doing cardio for 20 minutes 2 times per week. Autumn Curry reports that she is not eating breakfast every morning and when she does she is eating cheese toast. She is not getting adequate protein. She is ready to get back on track.  Her weight is 246 lb (111.6 kg) today and has had a weight gain of 6 pounds over a period of 2 weeks since her last visit. She has lost 7 lbs since starting treatment with [place].  Vitamin D deficiency Autumn Curry has a diagnosis of vitamin D deficiency. She is currently taking vit D and denies nausea, vomiting or muscle weakness.  Ref. Range 04/22/2018 11:57  Vitamin D, 25-Hydroxy Latest Ref Range: 30.0 - 100.0 ng/mL 39.6   Pre-Diabetes Autumn Curry has a diagnosis of prediabetes based on her elevated HgA1c and was informed this puts her at greater risk of developing diabetes. She is not taking metformin currently and continues to work on diet and exercise to decrease risk of diabetes. She denies nausea, polyphagia, or hypoglycemia.  At risk for diabetes Autumn Curry is at higher than averagerisk for developing diabetes due to her obesity. She currently denies polyuria or polydipsia.  ALLERGIES: No Known Allergies  MEDICATIONS: Current Outpatient Medications on File Prior to Visit  Medication Sig Dispense Refill  . aspirin-acetaminophen-caffeine (EXCEDRIN MIGRAINE) 250-250-65 MG tablet Take by mouth every 6 (six) hours as needed for headache.    [name] atenolol (TENORMIN) 25 MG tablet Take 1 tablet (25 mg total) by mouth daily. 90 tablet 1  . Darunavir-Cobicisctat-Emtricitabine-Tenofovir Alafenamide (SYMTUZA) 800-150-200-10 MG TABS Take 1 tablet by mouth daily with breakfast. 30 tablet 11  .  eletriptan (RELPAX) 20 MG tablet Take 1 tablet (20 mg total) by mouth as needed for migraine or headache. One tablet by mouth at onset of headache. May repeat in 2 hours if headache persists or recurs. 10 tablet 0  . hydrochlorothiazide (HYDRODIURIL) 25 MG tablet TAKE 1 TABLET BY MOUTH ONCE DAILY 90 tablet 1  . Ibuprofen 200 MG CAPS Take by mouth.    . loratadine (CLARITIN) 10 MG tablet Take 10 mg by mouth daily.    . Multiple Vitamins-Minerals (ALIVE WOMENS ENERGY) TABS Take 1 tablet by mouth daily.    . Omega-3-6-9 CAPS Take by mouth daily.    . valACYclovir (VALTREX) 500 MG tablet Take 1 tablet (500 mg total) by mouth 2 (two) times daily. 10 tablet 5   No current facility-administered medications on file prior to visit.     PAST MEDICAL HISTORY: Past Medical History:  Diagnosis Date  . Absence of menstruation   . Carbuncle and furuncle of unspecified site   . Elevated blood pressure reading without diagnosis of hypertension   . Encounter for long-term (current) use of other medications   . Headache(784.0)   . HIV infection ([place]) 2008   dx AIDS  . Hyperlipidemia   . Hypertension   . Routine general medical examination at a health care facility   . Screening for malignant neoplasm of the cervix   . Syphilis, unspecified     PAST SURGICAL HISTORY: History reviewed. No pertinent surgical history.  SOCIAL HISTORY: Social History   Tobacco Use  . Smoking  status: Never Smoker  . Smokeless tobacco: Never Used  Substance Use Topics  . Alcohol use: No    Alcohol/week: 0.0 standard drinks  . Drug use: No    FAMILY HISTORY: Family History  Problem Relation Age of Onset  . Hyperlipidemia Mother   . Hypertension Mother   . Depression Mother   . Coronary artery disease Father   . Diabetes Father   . Heart disease Father   . Hyperlipidemia Father   . Stroke Father   . Kidney disease Father     ROS: Review of Systems  Constitutional: Negative for weight loss.    Gastrointestinal: Negative for nausea and vomiting.  Musculoskeletal:       Negative for muscle weakness  Endo/Heme/Allergies:       Negative for hypoglycemia Negative for polyphagia    PHYSICAL EXAM: Blood pressure 139/77, pulse 78, temperature 98.1 F (36.7 C), temperature source Oral, height 5\' 2"  (1.575 m), weight 246 lb (111.6 kg), last menstrual period 05/24/2018, SpO2 93 %. Body mass index is 44.99 kg/m. Physical Exam  Constitutional: She is oriented to person, place, and time. She appears well-developed and well-nourished.  HENT:  Head: Normocephalic.  Eyes: Pupils are equal, round, and reactive to light.  Neck: Normal range of motion.  Cardiovascular: Normal rate.  Pulmonary/Chest: Effort normal.  Musculoskeletal: Normal range of motion.  Neurological: She is alert and oriented to person, place, and time.  Skin: Skin is warm and dry.  Psychiatric: She has a normal mood and affect. Her behavior is normal.  Vitals reviewed.   RECENT LABS AND TESTS: BMET    Component Value Date/Time   NA 139 04/07/2018 1639   K 3.5 04/07/2018 1639   CL 101 04/07/2018 1639   CO2 29 04/07/2018 1639   GLUCOSE 90 04/07/2018 1639   BUN 16 04/07/2018 1639   CREATININE 0.80 04/07/2018 1639   CALCIUM 10.0 04/07/2018 1639   GFRNONAA 94 04/07/2018 1639   GFRAA 108 04/07/2018 1639   Lab Results  Component Value Date   HGBA1C 5.7 (H) 04/22/2018   HGBA1C 5.7 (H) 12/14/2017   HGBA1C 5.6 05/25/2017   Lab Results  Component Value Date   INSULIN 28.7 (H) 04/22/2018   INSULIN 31.4 (H) 12/14/2017   CBC    Component Value Date/Time   WBC 7.2 04/07/2018 1639   RBC 4.10 04/07/2018 1639   HGB 12.1 04/07/2018 1639   HGB 12.5 12/14/2017 1131   HCT 35.6 04/07/2018 1639   HCT 39.3 12/14/2017 1131   PLT 351 04/07/2018 1639   MCV 86.8 04/07/2018 1639   MCV 92 12/14/2017 1131   MCH 29.5 04/07/2018 1639   MCHC 34.0 04/07/2018 1639   RDW 12.7 04/07/2018 1639   RDW 14.0 12/14/2017 1131    LYMPHSABS 3,269 04/07/2018 1639   LYMPHSABS 3.6 (H) 12/14/2017 1131   MONOABS 348 06/12/2016 1545   EOSABS 86 04/07/2018 1639   EOSABS 0.2 12/14/2017 1131   BASOSABS 50 04/07/2018 1639   BASOSABS 0.0 12/14/2017 1131   Iron/TIBC/Ferritin/ %Sat No results found for: IRON, TIBC, FERRITIN, IRONPCTSAT Lipid Panel     Component Value Date/Time   CHOL 194 04/22/2018 1157   TRIG 103 04/22/2018 1157   HDL 35 (L) 04/22/2018 1157   CHOLHDL 6 01/26/2018 1814   VLDL 25.6 01/26/2018 1814   LDLCALC 138 (H) 04/22/2018 1157   LDLCALC 152 (H) 11/20/2017 1647   Hepatic Function Panel     Component Value Date/Time   PROT 7.4 04/07/2018  1639   ALBUMIN 3.9 01/26/2018 1814   AST 17 04/07/2018 1639   ALT 14 04/07/2018 1639   ALKPHOS 60 01/26/2018 1814   BILITOT 0.4 04/07/2018 1639      Component Value Date/Time   TSH 1.710 12/14/2017 1131   TSH 1.420 05/25/2017 0921   TSH 1.96 08/18/2016 1624    Ref. Range 04/22/2018 11:57  Vitamin D, 25-Hydroxy Latest Ref Range: 30.0 - 100.0 ng/mL 39.6    ASSESSMENT AND PLAN: Vitamin D deficiency - Plan: Vitamin D, Ergocalciferol, (DRISDOL) 1.25 MG (50000 UT) CAPS capsule  Prediabetes  At risk for diabetes mellitus  Class 3 severe obesity with serious comorbidity and body mass index (BMI) of 45.0 to 49.9 in adult, unspecified obesity type ([place])  PLAN: Vitamin D Deficiency Autumn Curry was informed that low vitamin D levels contributes to fatigue and are associated with obesity, breast, and colon cancer. She agrees to continue to take prescription Vit D @50 ,000 IU every week #4 with no refills and will follow up for routine testing of vitamin D, at least 2-3 times per year. She was informed of the risk of over-replacement of vitamin D and agrees to not increase her dose unless she discusses this with [place] first. Agrees to follow up with our clinic as directed.   Pre-Diabetes Autumn Curry will continue to work on weight loss, exercise, and decreasing simple  carbohydrates in her diet to help decrease the risk of diabetes. We dicussed metformin including benefits and risks. She was informed that eating too many simple carbohydrates or too many calories at one sitting increases the likelihood of GI side effects. She will continue with diet. Autumn Curry agreed to follow up with [place] as directed to monitor her progress.  Diabetes risk counseling Autumn Curry was given extended (15 minutes) diabetes prevention counseling today. She is 37 y.o. female and has risk factors for diabetes including obesity. We discussed intensive lifestyle modifications today with an emphasis on weight loss as well as increasing exercise and decreasing simple carbohydrates in her diet.  Obesity Autumn Curry is currently in the action stage of change. As such, her goal is to continue with weight loss efforts She has agreed to follow the Category 2 plan Autumn Curry has been instructed to work up to a goal of 150 minutes of combined cardio and strengthening exercise per week for weight loss and overall health benefits. We discussed the following Behavioral Modification Strategies today: holiday eating strategies and keeping healthy foods in the home.    Autumn Curry has agreed to follow up with our clinic in 3 weeks. She was informed of the importance of frequent follow up visits to maximize her success with intensive lifestyle modifications for her multiple health conditions.   OBESITY BEHAVIORAL INTERVENTION VISIT  Today's visit was # 12   Starting weight: 253 lb Starting date: 12/14/17 Today's weight : Weight: 246 lb (111.6 kg)  Today's date: 06/23/2018 Total lbs lost to date: 7 lb At least 15 minutes were spent on discussing the following behavioral intervention visit.   ASK: We discussed the diagnosis of obesity with Geryl Councilman today and Giara agreed to give [place] permission to discuss obesity behavioral modification therapy today.  ASSESS: Kaden has the diagnosis of obesity and her  BMI today is 44.98 [name] is in the action stage of change   ADVISE: Leonetta was educated on the multiple health risks of obesity as well as the benefit of weight loss to improve her health. She was advised of the need for long term  treatment and the importance of lifestyle modifications to improve her current health and to decrease her risk of future health problems.  AGREE: Multiple dietary modification options and treatment options were discussed and  Burnice agreed to follow the recommendations documented in the above note.  ARRANGE: Navika was educated on the importance of frequent visits to treat obesity as outlined per CMS and USPSTF guidelines and agreed to schedule her next follow up appointment today.  Leary Roca, am acting as transcriptionist for Abby Potash, PA-C I, Abby Potash, PA-C have reviewed above note and agree with its content

## 2018-07-15 ENCOUNTER — Telehealth: Payer: Self-pay | Admitting: *Deleted

## 2018-07-15 ENCOUNTER — Encounter (INDEPENDENT_AMBULATORY_CARE_PROVIDER_SITE_OTHER): Payer: Self-pay | Admitting: Physician Assistant

## 2018-07-15 ENCOUNTER — Ambulatory Visit (INDEPENDENT_AMBULATORY_CARE_PROVIDER_SITE_OTHER): Payer: Managed Care, Other (non HMO) | Admitting: Physician Assistant

## 2018-07-15 VITALS — BP 104/73 | HR 68 | Temp 98.1°F | Ht 62.0 in | Wt 240.0 lb

## 2018-07-15 DIAGNOSIS — E559 Vitamin D deficiency, unspecified: Secondary | ICD-10-CM | POA: Diagnosis not present

## 2018-07-15 DIAGNOSIS — Z9189 Other specified personal risk factors, not elsewhere classified: Secondary | ICD-10-CM | POA: Diagnosis not present

## 2018-07-15 DIAGNOSIS — Z6841 Body Mass Index (BMI) 40.0 and over, adult: Secondary | ICD-10-CM

## 2018-07-15 DIAGNOSIS — E7849 Other hyperlipidemia: Secondary | ICD-10-CM

## 2018-07-15 MED ORDER — VITAMIN D (ERGOCALCIFEROL) 1.25 MG (50000 UNIT) PO CAPS: 50000.0000 [IU] | ORAL_CAPSULE | ORAL | 0 refills | Status: DC

## 2018-07-15 NOTE — Progress Notes (Signed)
Office: [phone]  /  Fax: [phone]   HPI:   Chief Complaint: OBESITY Autumn Curry is here to discuss her progress with her obesity treatment plan. She is on the Category 2 plan and is following her eating plan approximately 90 % of the time. She states she is walking and doing cardio and resistance bands 30 minutes 3 times per week. Autumn Curry did very well with weight loss. She reports that she is working to get all of her protein in at breakfast. She is somewhat bored with meats at dinner and she wants more breakfast options. Her weight is 240 lb (108.9 kg) today and has had a weight loss of 6 pounds over a period of 3 weeks since her last visit. She has lost 13 lbs since starting treatment with [place].  Vitamin D deficiency Autumn Curry has a diagnosis of vitamin D deficiency. She is currently taking vit D and denies nausea, vomiting or muscle weakness.  At risk for osteopenia and osteoporosis Autumn Curry is at higher risk of osteopenia and osteoporosis due to vitamin D deficiency.   Hyperlipidemia Autumn Curry has hyperlipidemia and she is currently on Omega tablet. She has been trying to improve her cholesterol levels with intensive lifestyle modification including a low saturated fat diet, exercise and weight loss. She denies any chest pain.  ASSESSMENT AND PLAN:  Vitamin D deficiency - Plan: Vitamin D, Ergocalciferol, (DRISDOL) 1.25 MG (50000 UT) CAPS capsule  Other hyperlipidemia  At risk for osteoporosis  Class 3 severe obesity with serious comorbidity and body mass index (BMI) of 40.0 to 44.9 in adult, unspecified obesity type ([place])  PLAN:  Vitamin D Deficiency Autumn Curry was informed that low vitamin D levels contributes to fatigue and are associated with obesity, breast, and colon cancer. She agrees to continue to take prescription Vit D @50 ,000 IU every week #4 with no refills and will follow up for routine testing of vitamin D, at least 2-3 times per year. She was informed of the risk of  over-replacement of vitamin D and agrees to not increase her dose unless she discusses this with [place] first. Kierrah agrees to follow up with our clinic in 3 weeks.  At risk for osteopenia and osteoporosis Autumn Curry was given extended  (15 minutes) osteoporosis prevention counseling today. Autumn Curry is at risk for osteopenia and osteoporosis due to her vitamin D deficiency. She was encouraged to take her vitamin D and follow her higher calcium diet and increase strengthening exercise to help strengthen her bones and decrease her risk of osteopenia and osteoporosis.  Hyperlipidemia Autumn Curry was informed of the American Heart Association Guidelines emphasizing intensive lifestyle modifications as the first line treatment for hyperlipidemia. We discussed many lifestyle modifications today in depth, and Autumn Curry will continue to work on decreasing saturated fats such as fatty red meat, butter and many fried foods. She will also increase vegetables and lean protein in her diet and continue to work on exercise and weight loss efforts.  Obesity Autumn Curry is currently in the action stage of change. As such, her goal is to continue with weight loss efforts She has agreed to follow the Category 2 plan Autumn Curry has been instructed to work up to a goal of 150 minutes of combined cardio and strengthening exercise per week for weight loss and overall health benefits. We discussed the following Behavioral Modification Strategies today: planning for success and work on meal planning and easy cooking plans  Autumn Curry has agreed to follow up with our clinic in 3 weeks. She was informed  of the importance of frequent follow up visits to maximize her success with intensive lifestyle modifications for her multiple health conditions.  ALLERGIES: No Known Allergies  MEDICATIONS: Current Outpatient Medications on File Prior to Visit  Medication Sig Dispense Refill  . aspirin-acetaminophen-caffeine (EXCEDRIN MIGRAINE) 250-250-65  MG tablet Take by mouth every 6 (six) hours as needed for headache.    [name] atenolol (TENORMIN) 25 MG tablet Take 1 tablet (25 mg total) by mouth daily. 90 tablet 1  . Darunavir-Cobicisctat-Emtricitabine-Tenofovir Alafenamide (SYMTUZA) 800-150-200-10 MG TABS Take 1 tablet by mouth daily with breakfast. 30 tablet 11  . eletriptan (RELPAX) 20 MG tablet Take 1 tablet (20 mg total) by mouth as needed for migraine or headache. One tablet by mouth at onset of headache. May repeat in 2 hours if headache persists or recurs. 10 tablet 0  . hydrochlorothiazide (HYDRODIURIL) 25 MG tablet TAKE 1 TABLET BY MOUTH ONCE DAILY 90 tablet 1  . Ibuprofen 200 MG CAPS Take by mouth.    . loratadine (CLARITIN) 10 MG tablet Take 10 mg by mouth daily.    . Multiple Vitamins-Minerals (ALIVE WOMENS ENERGY) TABS Take 1 tablet by mouth daily.    . Omega-3-6-9 CAPS Take by mouth daily.    . valACYclovir (VALTREX) 500 MG tablet Take 1 tablet (500 mg total) by mouth 2 (two) times daily. 10 tablet 5   No current facility-administered medications on file prior to visit.     PAST MEDICAL HISTORY: Past Medical History:  Diagnosis Date  . Absence of menstruation   . Carbuncle and furuncle of unspecified site   . Elevated blood pressure reading without diagnosis of hypertension   . Encounter for long-term (current) use of other medications   . Headache(784.0)   . HIV infection ([place]) 2008   dx AIDS  . Hyperlipidemia   . Hypertension   . Routine general medical examination at a health care facility   . Screening for malignant neoplasm of the cervix   . Syphilis, unspecified     PAST SURGICAL HISTORY: History reviewed. No pertinent surgical history.  SOCIAL HISTORY: Social History   Tobacco Use  . Smoking status: Never Smoker  . Smokeless tobacco: Never Used  Substance Use Topics  . Alcohol use: No    Alcohol/week: 0.0 standard drinks  . Drug use: No    FAMILY HISTORY: Family History  Problem Relation Age of  Onset  . Hyperlipidemia Mother   . Hypertension Mother   . Depression Mother   . Coronary artery disease Father   . Diabetes Father   . Heart disease Father   . Hyperlipidemia Father   . Stroke Father   . Kidney disease Father     ROS: Review of Systems  Constitutional: Positive for weight loss.  Cardiovascular: Negative for chest pain.  Gastrointestinal: Negative for nausea and vomiting.  Musculoskeletal:       Negative for muscle weakness    PHYSICAL EXAM: Blood pressure 104/73, pulse 68, temperature 98.1 F (36.7 C), temperature source Oral, height 5\' 2"  (1.575 m), weight 240 lb (108.9 kg), SpO2 94 %. Body mass index is 43.9 kg/m. Physical Exam Vitals signs reviewed.  Constitutional:      Appearance: Normal appearance. She is well-developed. She is obese.  Cardiovascular:     Rate and Rhythm: Normal rate.  Pulmonary:     Effort: Pulmonary effort is normal.  Musculoskeletal: Normal range of motion.  Skin:    General: Skin is warm and dry.  Neurological:  Mental Status: She is alert and oriented to person, place, and time.  Psychiatric:        Mood and Affect: Mood normal.        Behavior: Behavior normal.     RECENT LABS AND TESTS: BMET    Component Value Date/Time   NA 139 04/07/2018 1639   K 3.5 04/07/2018 1639   CL 101 04/07/2018 1639   CO2 29 04/07/2018 1639   GLUCOSE 90 04/07/2018 1639   BUN 16 04/07/2018 1639   CREATININE 0.80 04/07/2018 1639   CALCIUM 10.0 04/07/2018 1639   GFRNONAA 94 04/07/2018 1639   GFRAA 108 04/07/2018 1639   Lab Results  Component Value Date   HGBA1C 5.7 (H) 04/22/2018   HGBA1C 5.7 (H) 12/14/2017   HGBA1C 5.6 05/25/2017   Lab Results  Component Value Date   INSULIN 28.7 (H) 04/22/2018   INSULIN 31.4 (H) 12/14/2017   CBC    Component Value Date/Time   WBC 7.2 04/07/2018 1639   RBC 4.10 04/07/2018 1639   HGB 12.1 04/07/2018 1639   HGB 12.5 12/14/2017 1131   HCT 35.6 04/07/2018 1639   HCT 39.3 12/14/2017  1131   PLT 351 04/07/2018 1639   MCV 86.8 04/07/2018 1639   MCV 92 12/14/2017 1131   MCH 29.5 04/07/2018 1639   MCHC 34.0 04/07/2018 1639   RDW 12.7 04/07/2018 1639   RDW 14.0 12/14/2017 1131   LYMPHSABS 3,269 04/07/2018 1639   LYMPHSABS 3.6 (H) 12/14/2017 1131   MONOABS 348 06/12/2016 1545   EOSABS 86 04/07/2018 1639   EOSABS 0.2 12/14/2017 1131   BASOSABS 50 04/07/2018 1639   BASOSABS 0.0 12/14/2017 1131   Iron/TIBC/Ferritin/ %Sat No results found for: IRON, TIBC, FERRITIN, IRONPCTSAT Lipid Panel     Component Value Date/Time   CHOL 194 04/22/2018 1157   TRIG 103 04/22/2018 1157   HDL 35 (L) 04/22/2018 1157   CHOLHDL 6 01/26/2018 1814   VLDL 25.6 01/26/2018 1814   LDLCALC 138 (H) 04/22/2018 1157   LDLCALC 152 (H) 11/20/2017 1647   Hepatic Function Panel     Component Value Date/Time   PROT 7.4 04/07/2018 1639   ALBUMIN 3.9 01/26/2018 1814   AST 17 04/07/2018 1639   ALT 14 04/07/2018 1639   ALKPHOS 60 01/26/2018 1814   BILITOT 0.4 04/07/2018 1639      Component Value Date/Time   TSH 1.710 12/14/2017 1131   TSH 1.420 05/25/2017 0921   TSH 1.96 08/18/2016 1624   Results for SANJANA, FOLZ F (MRN [id number]) as of 07/15/2018 11:45  Ref. Range 04/22/2018 11:57  Vitamin D, 25-Hydroxy Latest Ref Range: 30.0 - 100.0 ng/mL 39.6     OBESITY BEHAVIORAL INTERVENTION VISIT  Today's visit was # 13   Starting weight: 253 lbs Starting date: 12/14/2017 Today's weight : 240 lbs Today's date: 07/15/2018 Total lbs lost to date: 13   ASK: We discussed the diagnosis of obesity with Geryl Councilman today and Darneshia agreed to give [place] permission to discuss obesity behavioral modification therapy today.  ASSESS: Meilani has the diagnosis of obesity and her BMI today is 43.89 Storey is in the action stage of change   ADVISE: Mirra was educated on the multiple health risks of obesity as well as the benefit of weight loss to improve her health. She was advised of the need  for long term treatment and the importance of lifestyle modifications to improve her current health and to decrease her risk of future health problems.  AGREE: Multiple dietary modification options and treatment options were discussed and  Ilayda agreed to follow the recommendations documented in the above note.  ARRANGE: Jalynne was educated on the importance of frequent visits to treat obesity as outlined per CMS and USPSTF guidelines and agreed to schedule her next follow up appointment today.  Corey Skains, am acting as transcriptionist for Abby Potash, PA-C I, Abby Potash, PA-C have reviewed above note and agree with its content

## 2018-07-15 NOTE — Telephone Encounter (Signed)
Patient brought in letter from insurance dated 06/16/18 stating she would need a prior authorization for for her symtuza due to insurance changes in the new year.  RN called [facility], spoke with [employer] A at 1130am.  Per Tye [place], she can only see through March 2020, but there is no hold or authorization needed for symtuza. All Symtuza fill requests are going through without issue.  There may be a previous authorization on file that is still valid, but she is unable to access that information. If there are any concerns, we are to refer to her documentation on record with her name, the date and the time.  Cathy A, 07/15/2018, 1130 am. Landis Gandy, RN

## 2018-07-22 ENCOUNTER — Other Ambulatory Visit: Payer: Self-pay | Admitting: *Deleted

## 2018-07-22 MED ORDER — VALACYCLOVIR HCL 500 MG PO TABS: 500.0000 mg | ORAL_TABLET | Freq: Two times a day (BID) | ORAL | 5 refills | Status: DC

## 2018-07-25 IMAGING — US US PELVIS COMPLETE
1 series · 14 of 25 positions shown · non-contrast
Comparison: CT 04/03/2003

CLINICAL DATA: Irregular cycles

EXAM:
TRANSABDOMINAL AND TRANSVAGINAL ULTRASOUND OF PELVIS
TECHNIQUE: Both transabdominal and transvaginal ultrasound examinations of the
pelvis were performed. Transabdominal technique was performed for
global imaging of the pelvis including uterus, ovaries, adnexal
regions, and pelvic cul-de-sac. It was necessary to proceed with
endovaginal exam following the transabdominal exam to visualize the
uterus, endometrium, ovaries and adnexa .

[Series 1: us pelvis complete · 0.27mm/px · 14 of 43 slices shown]
[im 1/43]
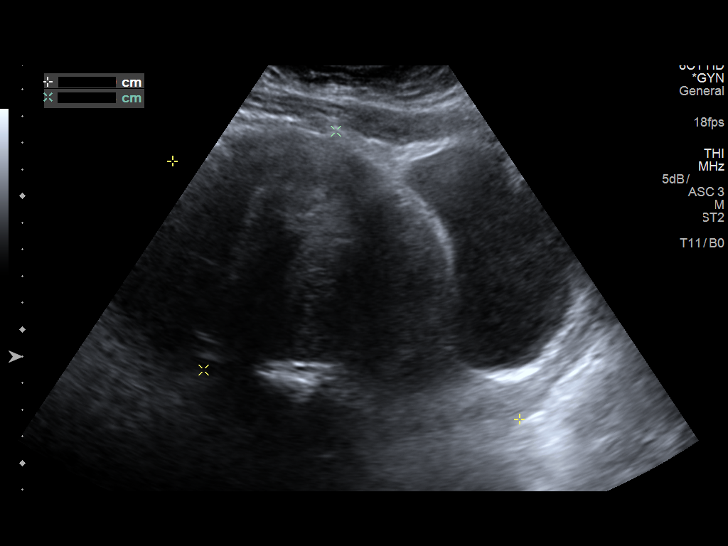
[im 4/43]
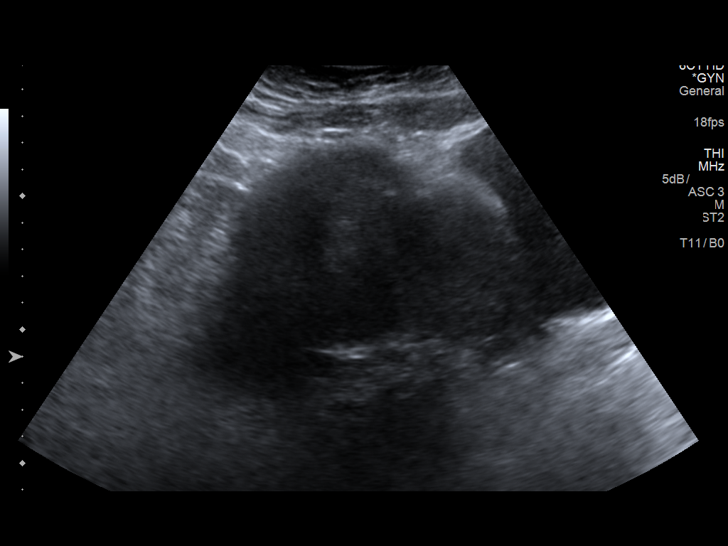
[im 8/43]
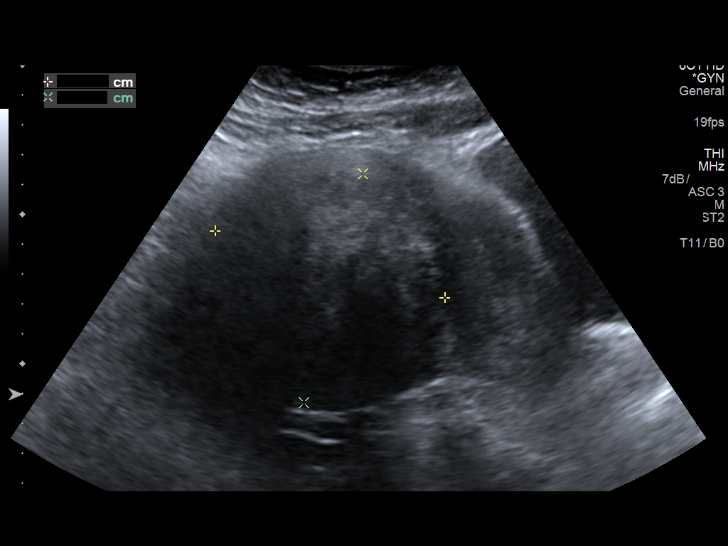
[im 11/43]
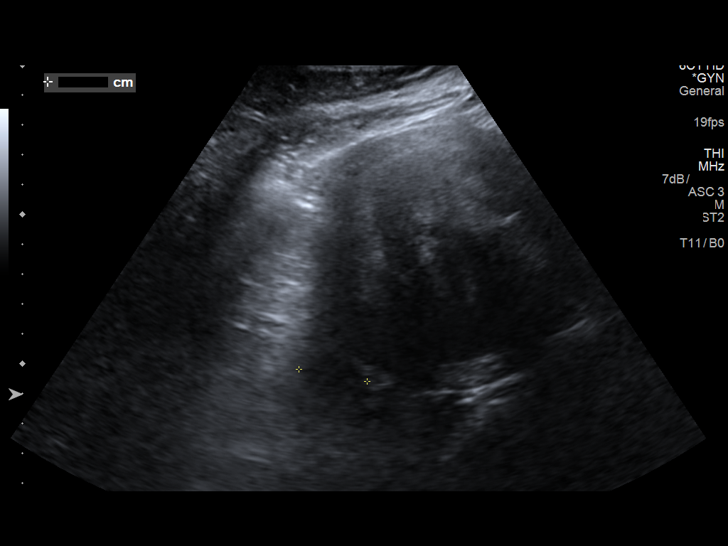
[im 15/43]
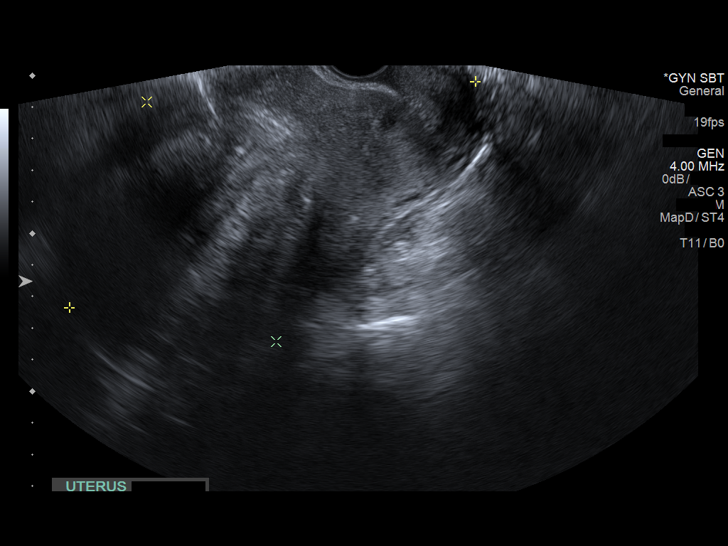
[im 16/43]
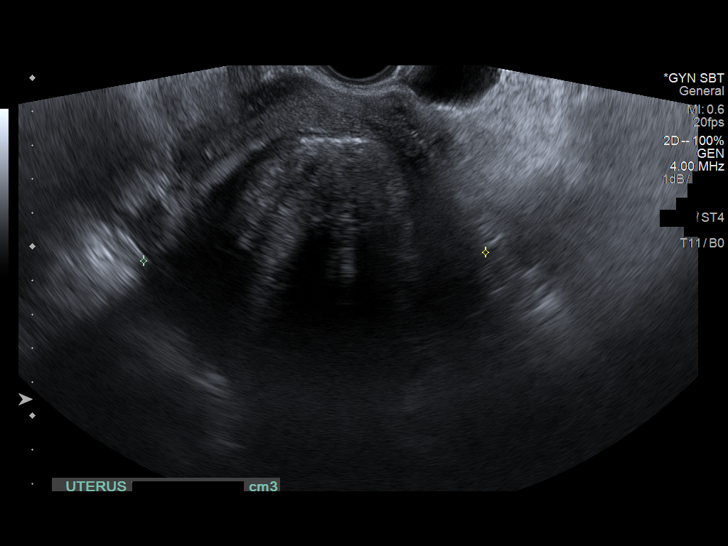
[im 20/43]
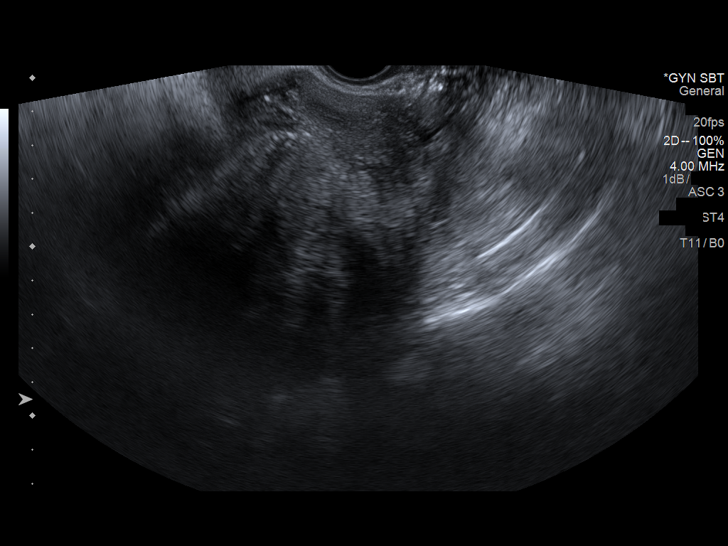
[im 23/43]
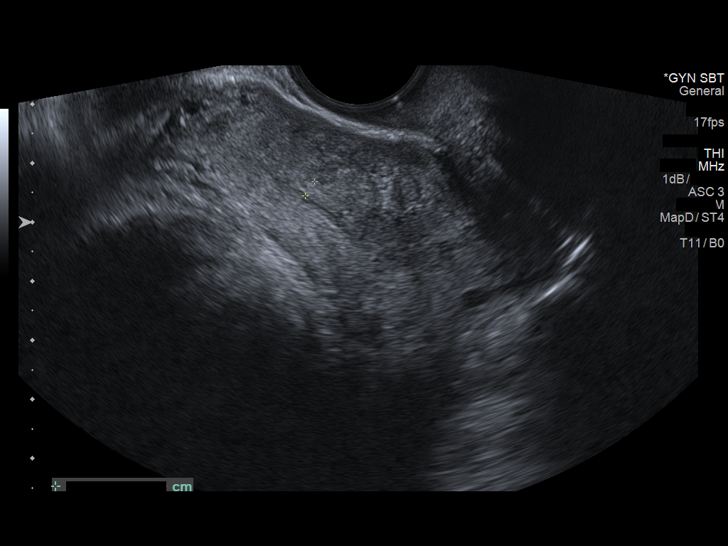
[im 27/43]
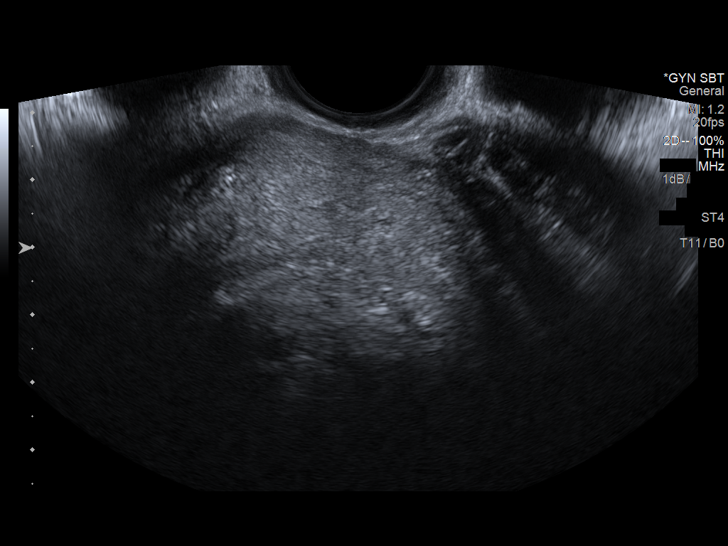
[im 29/43]
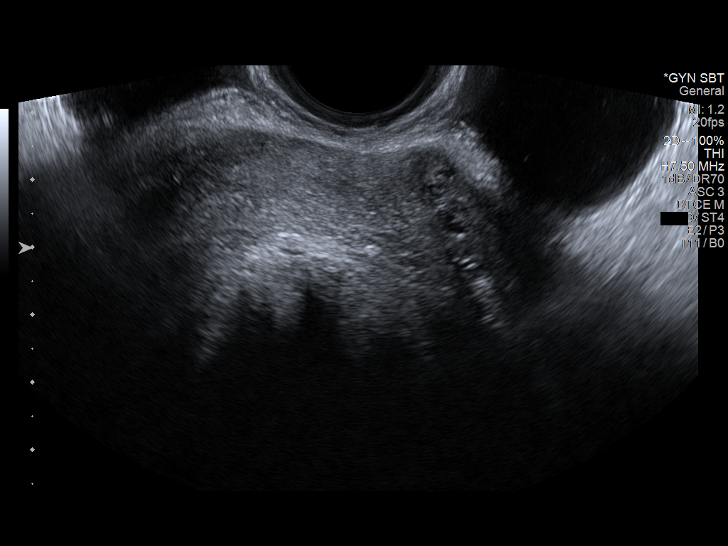
[im 32/43]
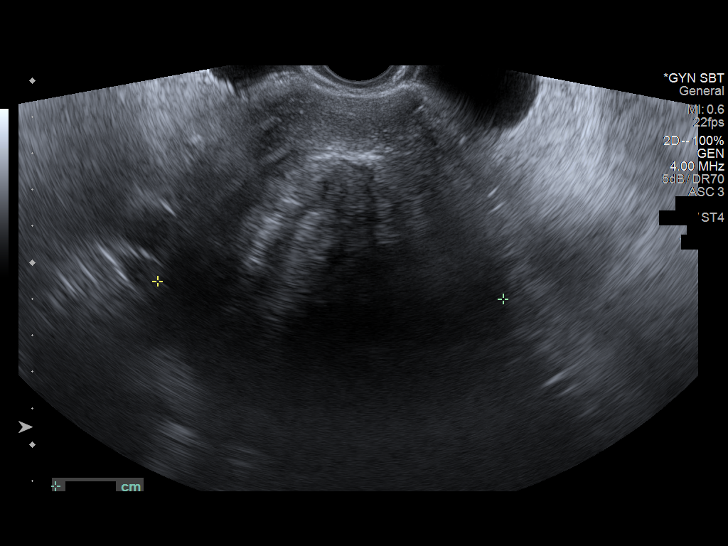
[im 36/43]
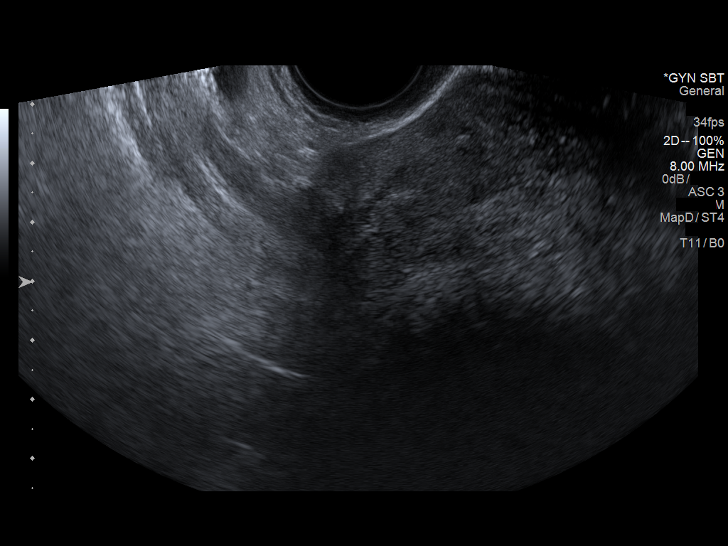
[im 39/43]
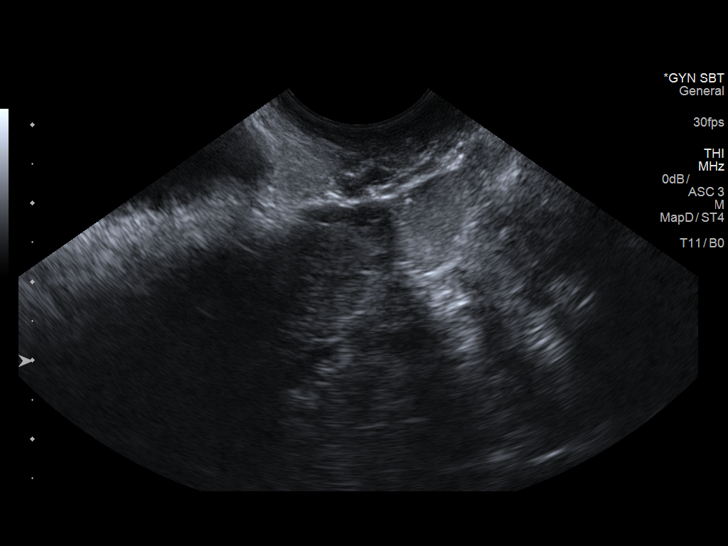
[im 43/43]
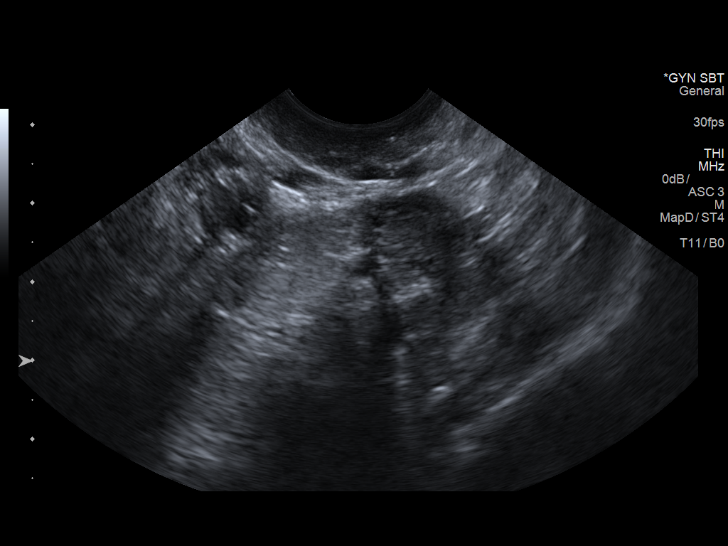

[14 of 25 positions shown; findings below may reference images not displayed]

FINDINGS: Uterus

Measurements: 6.1 x 2.4 x 4.4 cm. Large posterior
subserosal/pedunculated fundal fibroid measures 9.5 x 8.3 x 8.0 cm

Endometrium

Thickness: 3 mm in thickness.  No focal abnormality.

Right ovary

Measurements: Not well visualized due to the large right pelvic
fibroid.

Left ovary

Measurements: 2.1 x 1.2 x 1.5 cm. Normal appearance/no adnexal mass.

Other findings

No abnormal free fluid.
IMPRESSION: Large posterior fundal subserosal/ pedunculated fibroid measuring up
to 9.5 cm.

## 2018-07-30 ENCOUNTER — Other Ambulatory Visit: Payer: Self-pay | Admitting: Family Medicine

## 2018-07-30 DIAGNOSIS — I10 Essential (primary) hypertension: Secondary | ICD-10-CM

## 2018-07-30 MED ORDER — HYDROCHLOROTHIAZIDE 25 MG PO TABS: ORAL_TABLET | ORAL | 1 refills | Status: DC

## 2018-07-30 NOTE — Telephone Encounter (Signed)
Copied from [place] [phone]. Topic: Quick Communication - Rx Refill/Question >> Jul 30, 2018  2:53 PM Percell Belt A wrote: Medication: hydrochlorothiazide (HYDRODIURIL) 25 MG tablet [[id number]]   Has the patient contacted their pharmacy? No  (Agent: If no, request that the patient contact the pharmacy for the refill.) (Agent: If yes, when and what did the pharmacy advise?)  Preferred Pharmacy (with phone number or street name): [facility] ([address]), [place] - [place] [phone] (Phone)   Agent: Please be advised that RX refills may take up to 3 business days. We ask that you follow-up with your pharmacy.

## 2018-08-05 ENCOUNTER — Ambulatory Visit (INDEPENDENT_AMBULATORY_CARE_PROVIDER_SITE_OTHER): Payer: Managed Care, Other (non HMO) | Admitting: Physician Assistant

## 2018-08-11 ENCOUNTER — Encounter (INDEPENDENT_AMBULATORY_CARE_PROVIDER_SITE_OTHER): Payer: Self-pay | Admitting: Physician Assistant

## 2018-08-11 ENCOUNTER — Ambulatory Visit (INDEPENDENT_AMBULATORY_CARE_PROVIDER_SITE_OTHER): Payer: Managed Care, Other (non HMO) | Admitting: Physician Assistant

## 2018-08-11 VITALS — BP 114/80 | HR 60 | Temp 98.3°F | Ht 62.0 in | Wt 246.0 lb

## 2018-08-11 DIAGNOSIS — Z9189 Other specified personal risk factors, not elsewhere classified: Secondary | ICD-10-CM

## 2018-08-11 DIAGNOSIS — E559 Vitamin D deficiency, unspecified: Secondary | ICD-10-CM | POA: Diagnosis not present

## 2018-08-11 DIAGNOSIS — Z6841 Body Mass Index (BMI) 40.0 and over, adult: Secondary | ICD-10-CM

## 2018-08-11 DIAGNOSIS — R7303 Prediabetes: Secondary | ICD-10-CM | POA: Diagnosis not present

## 2018-08-11 MED ORDER — VITAMIN D (ERGOCALCIFEROL) 1.25 MG (50000 UNIT) PO CAPS: 50000.0000 [IU] | ORAL_CAPSULE | ORAL | 0 refills | Status: DC

## 2018-08-11 NOTE — Progress Notes (Signed)
Office: [phone]  /  Fax: [phone]   HPI:   Chief Complaint: OBESITY Avalin is here to discuss her progress with her obesity treatment plan. She is on the Category 2 plan and is following her eating plan approximately 85 % of the time. She states she is doing cardio 15 minutes 3 times per week. Autumn Curry reports that she ate out more often recently. She is ready to get back on track. Her weight is 246 lb (111.6 kg) today and has lost 0 lbs since her last visit. She has lost 7 lbs since starting treatment with [place].  Vitamin D deficiency Autumn Curry has a diagnosis of vitamin D deficiency. She is currently taking prescription Vit D and denies nausea, vomiting or muscle weakness.  Pre-Diabetes Autumn Curry has a diagnosis of prediabetes based on her elevated Hgb A1c and was informed this puts her at greater risk of developing diabetes. She continues to work on diet and exercise to decrease risk of diabetes. She denies nausea or hypoglycemia. She denies polyphagia.  At risk for diabetes Autumn Curry is at higher than average risk for developing diabetes due to her obesity. She currently denies polyuria or polydipsia.   ASSESSMENT AND PLAN:  Vitamin D deficiency - Plan: Vitamin D, Ergocalciferol, (DRISDOL) 1.25 MG (50000 UT) CAPS capsule  Prediabetes  At risk for diabetes mellitus  Class 3 severe obesity with serious comorbidity and body mass index (BMI) of 45.0 to 49.9 in adult, unspecified obesity type ([place])  PLAN:  Vitamin D Deficiency Autumn Curry was informed that low vitamin D levels contributes to fatigue and are associated with obesity, breast, and colon cancer. She agrees to continue to take prescription Vit D @50 ,000 IU every week #4 with no refills and will follow up for routine testing of vitamin D, at least 2-3 times per year. She was informed of the risk of over-replacement of vitamin D and agrees to not increase her dose unless she discusses this with [place] first. [name] agrees to  follow up with our clinic in 2 weeks.  Pre-Diabetes Autumn Curry will continue to work on weight loss, exercise, and decreasing simple carbohydrates in her diet to help decrease the risk of diabetes. She was informed that eating too many simple carbohydrates or too many calories at one sitting increases the likelihood of GI side effects. She is not on any medication. Autumn Curry agrees to continue with weight loss and follow up with our clinic in 2 weeks.  Diabetes risk counseling Autumn Curry was given extended (15 minutes) diabetes prevention counseling today. She is 38 y.o. female and has risk factors for diabetes including obesity. We discussed intensive lifestyle modifications today with an emphasis on weight loss as well as increasing exercise and decreasing simple carbohydrates in her diet.  Obesity Autumn Curry is currently in the action stage of change. As such, her goal is to continue with weight loss efforts She has agreed to follow the Category 2 plan Autumn Curry has been instructed to work up to a goal of 150 minutes of combined cardio and strengthening exercise per week for weight loss and overall health benefits. We discussed the following Behavioral Modification Strategies today: decrease eating out, keeping healthy foods in the home and planning for success  Autumn Curry has agreed to follow up with our clinic in 2 weeks. She was informed of the importance of frequent follow up visits to maximize her success with intensive lifestyle modifications for her multiple health conditions.  ALLERGIES: No Known Allergies  MEDICATIONS: Current Outpatient Medications on File Prior  to Visit  Medication Sig Dispense Refill  . aspirin-acetaminophen-caffeine (EXCEDRIN MIGRAINE) 250-250-65 MG tablet Take by mouth every 6 (six) hours as needed for headache.    [name] atenolol (TENORMIN) 25 MG tablet Take 1 tablet (25 mg total) by mouth daily. 90 tablet 1  . Darunavir-Cobicisctat-Emtricitabine-Tenofovir Alafenamide (SYMTUZA)  800-150-200-10 MG TABS Take 1 tablet by mouth daily with breakfast. 30 tablet 11  . eletriptan (RELPAX) 20 MG tablet Take 1 tablet (20 mg total) by mouth as needed for migraine or headache. One tablet by mouth at onset of headache. May repeat in 2 hours if headache persists or recurs. 10 tablet 0  . hydrochlorothiazide (HYDRODIURIL) 25 MG tablet TAKE 1 TABLET BY MOUTH ONCE DAILY 90 tablet 1  . Ibuprofen 200 MG CAPS Take by mouth.    . loratadine (CLARITIN) 10 MG tablet Take 10 mg by mouth daily.    . Multiple Vitamins-Minerals (ALIVE WOMENS ENERGY) TABS Take 1 tablet by mouth daily.    . Omega-3-6-9 CAPS Take by mouth daily.    . valACYclovir (VALTREX) 500 MG tablet Take 1 tablet (500 mg total) by mouth 2 (two) times daily. 10 tablet 5   No current facility-administered medications on file prior to visit.     PAST MEDICAL HISTORY: Past Medical History:  Diagnosis Date  . Absence of menstruation   . Carbuncle and furuncle of unspecified site   . Elevated blood pressure reading without diagnosis of hypertension   . Encounter for long-term (current) use of other medications   . Headache(784.0)   . HIV infection ([place]) 2008   dx AIDS  . Hyperlipidemia   . Hypertension   . Routine general medical examination at a health care facility   . Screening for malignant neoplasm of the cervix   . Syphilis, unspecified     PAST SURGICAL HISTORY: History reviewed. No pertinent surgical history.  SOCIAL HISTORY: Social History   Tobacco Use  . Smoking status: Never Smoker  . Smokeless tobacco: Never Used  Substance Use Topics  . Alcohol use: No    Alcohol/week: 0.0 standard drinks  . Drug use: No    FAMILY HISTORY: Family History  Problem Relation Age of Onset  . Hyperlipidemia Mother   . Hypertension Mother   . Depression Mother   . Coronary artery disease Father   . Diabetes Father   . Heart disease Father   . Hyperlipidemia Father   . Stroke Father   . Kidney disease Father       ROS: Review of Systems  Constitutional: Negative for weight loss.  Gastrointestinal: Negative for nausea and vomiting.  Genitourinary:       Negative for polyuria  Musculoskeletal:       Negative for muscle weakness  Endo/Heme/Allergies: Negative for polydipsia.       Negative for hypoglycemia Negative for polyphagia    PHYSICAL EXAM: Blood pressure 114/80, pulse 60, temperature 98.3 F (36.8 C), temperature source Oral, height 5\' 2"  (1.575 m), weight 246 lb (111.6 kg), SpO2 99 %. Body mass index is 44.99 kg/m. Physical Exam Vitals signs reviewed.  Constitutional:      Appearance: Normal appearance. She is obese.  Cardiovascular:     Rate and Rhythm: Normal rate.     Pulses: Normal pulses.  Pulmonary:     Effort: Pulmonary effort is normal.  Musculoskeletal: Normal range of motion.  Skin:    General: Skin is warm and dry.  Neurological:     Mental Status: She is alert  and oriented to person, place, and time.  Psychiatric:        Mood and Affect: Mood normal.        Behavior: Behavior normal.     RECENT LABS AND TESTS: BMET    Component Value Date/Time   NA 139 04/07/2018 1639   K 3.5 04/07/2018 1639   CL 101 04/07/2018 1639   CO2 29 04/07/2018 1639   GLUCOSE 90 04/07/2018 1639   BUN 16 04/07/2018 1639   CREATININE 0.80 04/07/2018 1639   CALCIUM 10.0 04/07/2018 1639   GFRNONAA 94 04/07/2018 1639   GFRAA 108 04/07/2018 1639   Lab Results  Component Value Date   HGBA1C 5.7 (H) 04/22/2018   HGBA1C 5.7 (H) 12/14/2017   HGBA1C 5.6 05/25/2017   Lab Results  Component Value Date   INSULIN 28.7 (H) 04/22/2018   INSULIN 31.4 (H) 12/14/2017   CBC    Component Value Date/Time   WBC 7.2 04/07/2018 1639   RBC 4.10 04/07/2018 1639   HGB 12.1 04/07/2018 1639   HGB 12.5 12/14/2017 1131   HCT 35.6 04/07/2018 1639   HCT 39.3 12/14/2017 1131   PLT 351 04/07/2018 1639   MCV 86.8 04/07/2018 1639   MCV 92 12/14/2017 1131   MCH 29.5 04/07/2018 1639   MCHC  34.0 04/07/2018 1639   RDW 12.7 04/07/2018 1639   RDW 14.0 12/14/2017 1131   LYMPHSABS 3,269 04/07/2018 1639   LYMPHSABS 3.6 (H) 12/14/2017 1131   MONOABS 348 06/12/2016 1545   EOSABS 86 04/07/2018 1639   EOSABS 0.2 12/14/2017 1131   BASOSABS 50 04/07/2018 1639   BASOSABS 0.0 12/14/2017 1131   Iron/TIBC/Ferritin/ %Sat No results found for: IRON, TIBC, FERRITIN, IRONPCTSAT Lipid Panel     Component Value Date/Time   CHOL 194 04/22/2018 1157   TRIG 103 04/22/2018 1157   HDL 35 (L) 04/22/2018 1157   CHOLHDL 6 01/26/2018 1814   VLDL 25.6 01/26/2018 1814   LDLCALC 138 (H) 04/22/2018 1157   LDLCALC 152 (H) 11/20/2017 1647   Hepatic Function Panel     Component Value Date/Time   PROT 7.4 04/07/2018 1639   ALBUMIN 3.9 01/26/2018 1814   AST 17 04/07/2018 1639   ALT 14 04/07/2018 1639   ALKPHOS 60 01/26/2018 1814   BILITOT 0.4 04/07/2018 1639      Component Value Date/Time   TSH 1.710 12/14/2017 1131   TSH 1.420 05/25/2017 0921   TSH 1.96 08/18/2016 1624      OBESITY BEHAVIORAL INTERVENTION VISIT  Today's visit was # 14   Starting weight: 253 lbs Starting date: 12/14/2017 Today's weight :: 246 lbs Today's date: 08/11/2018 Total lbs lost to date: 7    ASK: We discussed the diagnosis of obesity with Geryl Councilman today and Monica agreed to give [place] permission to discuss obesity behavioral modification therapy today.  ASSESS: Charlena has the diagnosis of obesity and her BMI today is 44.98 [place] is in the action stage of change   ADVISE: Dorianne was educated on the multiple health risks of obesity as well as the benefit of weight loss to improve her health. She was advised of the need for long term treatment and the importance of lifestyle modifications to improve her current health and to decrease her risk of future health problems.  AGREE: Multiple dietary modification options and treatment options were discussed and  Bonnita agreed to follow the  recommendations documented in the above note.  ARRANGE: Brande was educated on the importance of  frequent visits to treat obesity as outlined per CMS and USPSTF guidelines and agreed to schedule her next follow up appointment today.  I, Tammy Wysor, am acting as Location manager for [employer] I, Abby Potash, PA-C have reviewed above note and agree with its content

## 2018-08-26 ENCOUNTER — Ambulatory Visit (INDEPENDENT_AMBULATORY_CARE_PROVIDER_SITE_OTHER): Payer: Managed Care, Other (non HMO) | Admitting: Physician Assistant

## 2018-08-26 ENCOUNTER — Encounter (INDEPENDENT_AMBULATORY_CARE_PROVIDER_SITE_OTHER): Payer: Self-pay | Admitting: Physician Assistant

## 2018-08-26 VITALS — BP 119/82 | HR 85 | Temp 97.9°F | Ht 62.0 in | Wt 243.0 lb

## 2018-08-26 DIAGNOSIS — Z6841 Body Mass Index (BMI) 40.0 and over, adult: Secondary | ICD-10-CM

## 2018-08-26 DIAGNOSIS — E7849 Other hyperlipidemia: Secondary | ICD-10-CM | POA: Diagnosis not present

## 2018-08-28 NOTE — Progress Notes (Signed)
Office: [phone]  /  Fax: [phone]   HPI:   Chief Complaint: OBESITY Autumn Curry is here to discuss her progress with her obesity treatment plan. She is on the Category 2 plan and is following her eating plan approximately 85 % of the time. She states she is doing cardio and resistance for 15-30 minutes 3 times per week. [name] did well with weight loss. She reports that there are times she is not eating breakfasting due to lack of planning or time.  Her weight is 243 lb (110.2 kg) today and has had a weight loss of 3 pounds over a period of 2 weeks since her last visit. She has lost 10 lbs since starting treatment with [place].  Hyperlipidemia Autumn Curry has hyperlipidemia and has been trying to improve her cholesterol levels with intensive lifestyle modification including a low saturated fat diet, exercise and weight loss. She is not on medications and denies any chest pain, claudication or myalgias.  ALLERGIES: No Known Allergies  MEDICATIONS: Current Outpatient Medications on File Prior to Visit  Medication Sig Dispense Refill  . aspirin-acetaminophen-caffeine (EXCEDRIN MIGRAINE) 250-250-65 MG tablet Take by mouth every 6 (six) hours as needed for headache.    [name] atenolol (TENORMIN) 25 MG tablet Take 1 tablet (25 mg total) by mouth daily. 90 tablet 1  . Darunavir-Cobicisctat-Emtricitabine-Tenofovir Alafenamide (SYMTUZA) 800-150-200-10 MG TABS Take 1 tablet by mouth daily with breakfast. 30 tablet 11  . eletriptan (RELPAX) 20 MG tablet Take 1 tablet (20 mg total) by mouth as needed for migraine or headache. One tablet by mouth at onset of headache. May repeat in 2 hours if headache persists or recurs. 10 tablet 0  . hydrochlorothiazide (HYDRODIURIL) 25 MG tablet TAKE 1 TABLET BY MOUTH ONCE DAILY 90 tablet 1  . Ibuprofen 200 MG CAPS Take by mouth.    . loratadine (CLARITIN) 10 MG tablet Take 10 mg by mouth daily.    . Multiple Vitamins-Minerals (ALIVE WOMENS ENERGY) TABS Take 1 tablet by  mouth daily.    . Omega-3-6-9 CAPS Take by mouth daily.    . valACYclovir (VALTREX) 500 MG tablet Take 1 tablet (500 mg total) by mouth 2 (two) times daily. 10 tablet 5  . Vitamin D, Ergocalciferol, (DRISDOL) 1.25 MG (50000 UT) CAPS capsule Take 1 capsule (50,000 Units total) by mouth every 7 (seven) days. 4 capsule 0   No current facility-administered medications on file prior to visit.     PAST MEDICAL HISTORY: Past Medical History:  Diagnosis Date  . Absence of menstruation   . Carbuncle and furuncle of unspecified site   . Elevated blood pressure reading without diagnosis of hypertension   . Encounter for long-term (current) use of other medications   . Headache(784.0)   . HIV infection ([place]) 2008   dx AIDS  . Hyperlipidemia   . Hypertension   . Routine general medical examination at a health care facility   . Screening for malignant neoplasm of the cervix   . Syphilis, unspecified     PAST SURGICAL HISTORY: History reviewed. No pertinent surgical history.  SOCIAL HISTORY: Social History   Tobacco Use  . Smoking status: Never Smoker  . Smokeless tobacco: Never Used  Substance Use Topics  . Alcohol use: No    Alcohol/week: 0.0 standard drinks  . Drug use: No    FAMILY HISTORY: Family History  Problem Relation Age of Onset  . Hyperlipidemia Mother   . Hypertension Mother   . Depression Mother   . Coronary  artery disease Father   . Diabetes Father   . Heart disease Father   . Hyperlipidemia Father   . Stroke Father   . Kidney disease Father     ROS: Review of Systems  Constitutional: Positive for weight loss.  Cardiovascular: Negative for chest pain and claudication.  Musculoskeletal: Negative for myalgias.    PHYSICAL EXAM: Blood pressure 119/82, pulse 85, temperature 97.9 F (36.6 C), temperature source Oral, height 5\' 2"  (1.575 m), weight 243 lb (110.2 kg), last menstrual period 08/13/2018, SpO2 97 %. Body mass index is 44.45 kg/m. Physical  Exam Vitals signs reviewed.  Constitutional:      Appearance: Normal appearance. She is obese.  Cardiovascular:     Rate and Rhythm: Normal rate.     Pulses: Normal pulses.  Pulmonary:     Effort: Pulmonary effort is normal.     Breath sounds: Normal breath sounds.  Musculoskeletal: Normal range of motion.  Skin:    General: Skin is warm and dry.  Neurological:     Mental Status: She is alert and oriented to person, place, and time.  Psychiatric:        Mood and Affect: Mood normal.        Behavior: Behavior normal.     RECENT LABS AND TESTS: BMET    Component Value Date/Time   NA 139 04/07/2018 1639   K 3.5 04/07/2018 1639   CL 101 04/07/2018 1639   CO2 29 04/07/2018 1639   GLUCOSE 90 04/07/2018 1639   BUN 16 04/07/2018 1639   CREATININE 0.80 04/07/2018 1639   CALCIUM 10.0 04/07/2018 1639   GFRNONAA 94 04/07/2018 1639   GFRAA 108 04/07/2018 1639   Lab Results  Component Value Date   HGBA1C 5.7 (H) 04/22/2018   HGBA1C 5.7 (H) 12/14/2017   HGBA1C 5.6 05/25/2017   Lab Results  Component Value Date   INSULIN 28.7 (H) 04/22/2018   INSULIN 31.4 (H) 12/14/2017   CBC    Component Value Date/Time   WBC 7.2 04/07/2018 1639   RBC 4.10 04/07/2018 1639   HGB 12.1 04/07/2018 1639   HGB 12.5 12/14/2017 1131   HCT 35.6 04/07/2018 1639   HCT 39.3 12/14/2017 1131   PLT 351 04/07/2018 1639   MCV 86.8 04/07/2018 1639   MCV 92 12/14/2017 1131   MCH 29.5 04/07/2018 1639   MCHC 34.0 04/07/2018 1639   RDW 12.7 04/07/2018 1639   RDW 14.0 12/14/2017 1131   LYMPHSABS 3,269 04/07/2018 1639   LYMPHSABS 3.6 (H) 12/14/2017 1131   MONOABS 348 06/12/2016 1545   EOSABS 86 04/07/2018 1639   EOSABS 0.2 12/14/2017 1131   BASOSABS 50 04/07/2018 1639   BASOSABS 0.0 12/14/2017 1131   Iron/TIBC/Ferritin/ %Sat No results found for: IRON, TIBC, FERRITIN, IRONPCTSAT Lipid Panel     Component Value Date/Time   CHOL 194 04/22/2018 1157   TRIG 103 04/22/2018 1157   HDL 35 (L)  04/22/2018 1157   CHOLHDL 6 01/26/2018 1814   VLDL 25.6 01/26/2018 1814   LDLCALC 138 (H) 04/22/2018 1157   LDLCALC 152 (H) 11/20/2017 1647   Hepatic Function Panel     Component Value Date/Time   PROT 7.4 04/07/2018 1639   ALBUMIN 3.9 01/26/2018 1814   AST 17 04/07/2018 1639   ALT 14 04/07/2018 1639   ALKPHOS 60 01/26/2018 1814   BILITOT 0.4 04/07/2018 1639      Component Value Date/Time   TSH 1.710 12/14/2017 1131   TSH 1.420 05/25/2017 7588  TSH 1.96 08/18/2016 1624    ASSESSMENT AND PLAN: Other hyperlipidemia  Class 3 severe obesity with serious comorbidity and body mass index (BMI) of 40.0 to 44.9 in adult, unspecified obesity type ([place])  PLAN:  Hyperlipidemia Brynley was informed of the American Heart Association Guidelines emphasizing intensive lifestyle modifications as the first line treatment for hyperlipidemia. We discussed many lifestyle modifications today in depth, and Jennipher will continue to work on decreasing saturated fats such as fatty red meat, butter and many fried foods. She will also increase vegetables and lean protein in her diet and continue to work on exercise and weight loss efforts. Katheline agrees to follow up with our clinic in 2 weeks.  I spent > than 50% of the 15 minute visit on counseling as documented in the note.  Obesity Natasha is currently in the action stage of change. As such, her goal is to continue with weight loss efforts She has agreed to follow the Category 2 plan Aydia has been instructed to work up to a goal of 150 minutes of combined cardio and strengthening exercise per week for weight loss and overall health benefits. We discussed the following Behavioral Modification Strategies today: work on meal planning and easy cooking plans and no skipping meals   Desaray has agreed to follow up with our clinic in 2 weeks. She was informed of the importance of frequent follow up visits to maximize her success with intensive  lifestyle modifications for her multiple health conditions.   OBESITY BEHAVIORAL INTERVENTION VISIT  Today's visit was # 15   Starting weight: 253 lbs Starting date: 12/14/17 Today's weight : 243 lbs  Today's date: 08/26/2018 Total lbs lost to date: 10    ASK: We discussed the diagnosis of obesity with Geryl Councilman today and Iviana agreed to give [place] permission to discuss obesity behavioral modification therapy today.  ASSESS: Juleen has the diagnosis of obesity and her BMI today is 44.43 [place] is in the action stage of change   ADVISE: Dhanvi was educated on the multiple health risks of obesity as well as the benefit of weight loss to improve her health. She was advised of the need for long term treatment and the importance of lifestyle modifications.  AGREE: Multiple dietary modification options and treatment options were discussed and  Juelz agreed to the above obesity treatment plan.  Wilhemena Durie, am acting as transcriptionist for Abby Potash, PA-C I, Abby Potash, PA-C have reviewed above note and agree with its content

## 2018-08-31 ENCOUNTER — Telehealth: Payer: Self-pay | Admitting: *Deleted

## 2018-08-31 NOTE — Telephone Encounter (Signed)
Received after hour fax from 08/28/18  "Chief complaint: Wheezing.  Caller states she has a productive cough."  Spoke with patient to check status.  She stated that she tried to go over to Primary Care at [facility] but they closed at 1pm.  She states that the wheezing on 08/28/18.  She is now better and has been taking otc meds.  Advised if wheeze comes back to please come in

## 2018-08-31 NOTE — Telephone Encounter (Signed)
noted 

## 2018-09-13 ENCOUNTER — Encounter (INDEPENDENT_AMBULATORY_CARE_PROVIDER_SITE_OTHER): Payer: Self-pay | Admitting: Physician Assistant

## 2018-09-13 ENCOUNTER — Ambulatory Visit (INDEPENDENT_AMBULATORY_CARE_PROVIDER_SITE_OTHER): Payer: Managed Care, Other (non HMO) | Admitting: Physician Assistant

## 2018-09-13 VITALS — BP 120/83 | HR 64 | Temp 97.7°F | Ht 62.0 in | Wt 245.0 lb

## 2018-09-13 DIAGNOSIS — R7303 Prediabetes: Secondary | ICD-10-CM | POA: Diagnosis not present

## 2018-09-13 DIAGNOSIS — E559 Vitamin D deficiency, unspecified: Secondary | ICD-10-CM

## 2018-09-13 DIAGNOSIS — E7849 Other hyperlipidemia: Secondary | ICD-10-CM

## 2018-09-13 DIAGNOSIS — Z9189 Other specified personal risk factors, not elsewhere classified: Secondary | ICD-10-CM | POA: Diagnosis not present

## 2018-09-13 DIAGNOSIS — Z6841 Body Mass Index (BMI) 40.0 and over, adult: Secondary | ICD-10-CM

## 2018-09-13 MED ORDER — VITAMIN D (ERGOCALCIFEROL) 1.25 MG (50000 UNIT) PO CAPS: 50000.0000 [IU] | ORAL_CAPSULE | ORAL | 0 refills | Status: DC

## 2018-09-13 NOTE — Progress Notes (Signed)
Office: [phone]  /  Fax: [phone]   HPI:   Chief Complaint: OBESITY Autumn Curry is here to discuss her progress with her obesity treatment plan. She is on the Category 2 plan and is following her eating plan approximately 85% of the time. She states she is doing cardio and resistance 30 minutes 3 times per week. Autumn Curry reports that she struggled with parties and social activities. She was able to get back on track over the last week. She is motivated to continue with the plan. Her weight is 245 lb (111.1 kg) today and has had a weight gain of 2 pounds since her last visit. She has lost 8 lbs since starting treatment with [place].  Vitamin D deficiency Autumn Curry has a diagnosis of Vitamin D deficiency. She is currently taking prescription Vit D and denies nausea, vomiting or muscle weakness.  At risk for osteopenia and osteoporosis Autumn Curry is at higher risk of osteopenia and osteoporosis due to Vitamin D deficiency.   Pre-Diabetes Autumn Curry has a diagnosis of prediabetes based on her elevated Hgb A1c and was informed this puts her at greater risk of developing diabetes. She is not taking metformin currently and continues to work on diet and exercise to decrease risk of diabetes. She denies polyphagia.  Hyperlipidemia Autumn Curry has hyperlipidemia and has been trying to improve her cholesterol levels with intensive lifestyle modification including a low saturated fat diet, exercise and weight loss. She states she is on Omega-3 and denies any chest pain.  ASSESSMENT AND PLAN:  Vitamin D deficiency - Plan: VITAMIN D 25 Hydroxy (Vit-D Deficiency, Fractures), Vitamin D, Ergocalciferol, (DRISDOL) 1.25 MG (50000 UT) CAPS capsule  Prediabetes - Plan: Comprehensive metabolic panel, Hemoglobin A1c, Insulin, random  Other hyperlipidemia - Plan: Lipid Panel With LDL/HDL Ratio  At risk for osteoporosis  Class 3 severe obesity with serious comorbidity and body mass index (BMI) of 45.0 to 49.9 in  adult, unspecified obesity type ([place])  PLAN:  Vitamin D Deficiency Autumn Curry was informed that low Vitamin D levels contributes to fatigue and are associated with obesity, breast, and colon cancer. She agrees to continue to take prescription Vit D @ 50,000 IU every week #4 with 0 refills and will have routine testing of Vitamin D today. She was informed of the risk of over-replacement of Vitamin D and agrees to not increase her dose unless she discusses this with [place] first. Autumn Curry agrees to follow-up with our clinic in 2-3 weeks.  At risk for osteopenia and osteoporosis Autumn Curry was given extended  (15 minutes) osteoporosis prevention counseling today. Autumn Curry is at risk for osteopenia and osteoporsis due to her Vitamin D deficiency. She was encouraged to take her Vitamin D and follow her higher calcium diet and increase strengthening exercise to help strengthen her bones and decrease her risk of osteopenia and osteoporosis.  Pre-Diabetes Autumn Curry will continue to work on weight loss, exercise, and decreasing simple carbohydrates in her diet to help decrease the risk of diabetes. We dicussed metformin including benefits and risks. She was informed that eating too many simple carbohydrates or too many calories at one sitting increases the likelihood of GI side effects. Autumn Curry is not currently on metformin and a prescription was not written today. Autumn Curry will have labs drawn today and has agreed to follow-up with [place] as directed to monitor her progress.  Hyperlipidemia Autumn Curry was informed of the American Heart Association Guidelines emphasizing intensive lifestyle modifications as the first line treatment for hyperlipidemia. We discussed many lifestyle modifications today in  depth, and Autumn Curry will continue to work on decreasing saturated fats such as fatty red meat, butter and many fried foods. She will also increase vegetables and lean protein in her diet and continue to work on exercise and weight loss  efforts. Autumn Curry will have labs drawn today.  Obesity Autumn Curry is currently in the action stage of change. As such, her goal is to continue with weight loss efforts. She has agreed to follow the Category 2 plan. Autumn Curry has been instructed to work up to a goal of 150 minutes of combined cardio and strengthening exercise per week for weight loss and overall health benefits. We discussed the following Behavioral Modification Strategies today: work on meal planning and easy cooking plans and planning for success.  Autumn Curry has agreed to follow-up with our clinic in 2-3 weeks. She was informed of the importance of frequent follow up visits to maximize her success with intensive lifestyle modifications for her multiple health conditions.  ALLERGIES: No Known Allergies  MEDICATIONS: Current Outpatient Medications on File Prior to Visit  Medication Sig Dispense Refill  . aspirin-acetaminophen-caffeine (EXCEDRIN MIGRAINE) 250-250-65 MG tablet Take by mouth every 6 (six) hours as needed for headache.    [name] atenolol (TENORMIN) 25 MG tablet Take 1 tablet (25 mg total) by mouth daily. 90 tablet 1  . Darunavir-Cobicisctat-Emtricitabine-Tenofovir Alafenamide (SYMTUZA) 800-150-200-10 MG TABS Take 1 tablet by mouth daily with breakfast. 30 tablet 11  . eletriptan (RELPAX) 20 MG tablet Take 1 tablet (20 mg total) by mouth as needed for migraine or headache. One tablet by mouth at onset of headache. May repeat in 2 hours if headache persists or recurs. 10 tablet 0  . hydrochlorothiazide (HYDRODIURIL) 25 MG tablet TAKE 1 TABLET BY MOUTH ONCE DAILY 90 tablet 1  . Ibuprofen 200 MG CAPS Take by mouth.    . loratadine (CLARITIN) 10 MG tablet Take 10 mg by mouth daily.    . Multiple Vitamins-Minerals (ALIVE WOMENS ENERGY) TABS Take 1 tablet by mouth daily.    . Omega-3-6-9 CAPS Take by mouth daily.    . valACYclovir (VALTREX) 500 MG tablet Take 1 tablet (500 mg total) by mouth 2 (two) times daily. 10 tablet 5   No  current facility-administered medications on file prior to visit.     PAST MEDICAL HISTORY: Past Medical History:  Diagnosis Date  . Absence of menstruation   . Carbuncle and furuncle of unspecified site   . Elevated blood pressure reading without diagnosis of hypertension   . Encounter for long-term (current) use of other medications   . Headache(784.0)   . HIV infection ([place]) 2008   dx AIDS  . Hyperlipidemia   . Hypertension   . Routine general medical examination at a health care facility   . Screening for malignant neoplasm of the cervix   . Syphilis, unspecified     PAST SURGICAL HISTORY: History reviewed. No pertinent surgical history.  SOCIAL HISTORY: Social History   Tobacco Use  . Smoking status: Never Smoker  . Smokeless tobacco: Never Used  Substance Use Topics  . Alcohol use: No    Alcohol/week: 0.0 standard drinks  . Drug use: No    FAMILY HISTORY: Family History  Problem Relation Age of Onset  . Hyperlipidemia Mother   . Hypertension Mother   . Depression Mother   . Coronary artery disease Father   . Diabetes Father   . Heart disease Father   . Hyperlipidemia Father   . Stroke Father   . Kidney  disease Father    ROS: Review of Systems  Constitutional: Negative for weight loss.  Cardiovascular: Negative for chest pain.  Gastrointestinal: Negative for nausea and vomiting.  Musculoskeletal:       Negative for muscle weakness.  Endo/Heme/Allergies:       Negative for polyphagia. Negative for hypoglycemia.   PHYSICAL EXAM: Blood pressure 120/83, pulse 64, temperature 97.7 F (36.5 C), height 5\' 2"  (1.575 m), weight 245 lb (111.1 kg), last menstrual period 09/11/2018, SpO2 97 %. Body mass index is 44.81 kg/m. Physical Exam Vitals signs reviewed.  Constitutional:      Appearance: Normal appearance. She is obese.  Cardiovascular:     Rate and Rhythm: Normal rate.     Pulses: Normal pulses.  Pulmonary:     Effort: Pulmonary effort is  normal.     Breath sounds: Normal breath sounds.  Musculoskeletal: Normal range of motion.  Skin:    General: Skin is warm and dry.  Neurological:     Mental Status: She is alert and oriented to person, place, and time.  Psychiatric:        Behavior: Behavior normal.   RECENT LABS AND TESTS: BMET    Component Value Date/Time   NA 139 04/07/2018 1639   K 3.5 04/07/2018 1639   CL 101 04/07/2018 1639   CO2 29 04/07/2018 1639   GLUCOSE 90 04/07/2018 1639   BUN 16 04/07/2018 1639   CREATININE 0.80 04/07/2018 1639   CALCIUM 10.0 04/07/2018 1639   GFRNONAA 94 04/07/2018 1639   GFRAA 108 04/07/2018 1639   Lab Results  Component Value Date   HGBA1C 5.7 (H) 04/22/2018   HGBA1C 5.7 (H) 12/14/2017   HGBA1C 5.6 05/25/2017   Lab Results  Component Value Date   INSULIN 28.7 (H) 04/22/2018   INSULIN 31.4 (H) 12/14/2017   CBC    Component Value Date/Time   WBC 7.2 04/07/2018 1639   RBC 4.10 04/07/2018 1639   HGB 12.1 04/07/2018 1639   HGB 12.5 12/14/2017 1131   HCT 35.6 04/07/2018 1639   HCT 39.3 12/14/2017 1131   PLT 351 04/07/2018 1639   MCV 86.8 04/07/2018 1639   MCV 92 12/14/2017 1131   MCH 29.5 04/07/2018 1639   MCHC 34.0 04/07/2018 1639   RDW 12.7 04/07/2018 1639   RDW 14.0 12/14/2017 1131   LYMPHSABS 3,269 04/07/2018 1639   LYMPHSABS 3.6 (H) 12/14/2017 1131   MONOABS 348 06/12/2016 1545   EOSABS 86 04/07/2018 1639   EOSABS 0.2 12/14/2017 1131   BASOSABS 50 04/07/2018 1639   BASOSABS 0.0 12/14/2017 1131   Iron/TIBC/Ferritin/ %Sat No results found for: IRON, TIBC, FERRITIN, IRONPCTSAT Lipid Panel     Component Value Date/Time   CHOL 194 04/22/2018 1157   TRIG 103 04/22/2018 1157   HDL 35 (L) 04/22/2018 1157   CHOLHDL 6 01/26/2018 1814   VLDL 25.6 01/26/2018 1814   LDLCALC 138 (H) 04/22/2018 1157   LDLCALC 152 (H) 11/20/2017 1647   Hepatic Function Panel     Component Value Date/Time   PROT 7.4 04/07/2018 1639   ALBUMIN 3.9 01/26/2018 1814   AST 17  04/07/2018 1639   ALT 14 04/07/2018 1639   ALKPHOS 60 01/26/2018 1814   BILITOT 0.4 04/07/2018 1639      Component Value Date/Time   TSH 1.710 12/14/2017 1131   TSH 1.420 05/25/2017 0921   TSH 1.96 08/18/2016 1624    Ref. Range 04/22/2018 11:57  Vitamin D, 25-Hydroxy Latest Ref Range: 30.0 -  100.0 ng/mL 39.6   OBESITY BEHAVIORAL INTERVENTION VISIT  Today's visit was #16  Starting weight: 253 lbs Starting date: 12/14/2017 Today's weight: 245 lbs  Today's date: 09/13/2018 Total lbs lost to date: 8    09/13/2018  Height 5\' 2"  (1.575 m)  Weight 245 lb (111.1 kg)  BMI (Calculated) 44.8  BLOOD PRESSURE - SYSTOLIC 417  BLOOD PRESSURE - DIASTOLIC 83   Body Fat % 40.8 %  Total Body Water (lbs) 87.2 lbs   ASK: We discussed the diagnosis of obesity with Autumn Curry today and Autumn Curry agreed to give [place] permission to discuss obesity behavioral modification therapy today.  ASSESS: Autumn Curry has the diagnosis of obesity and her BMI today is 44.8. Autumn Curry is in the action stage of change.   ADVISE: Autumn Curry was educated on the multiple health risks of obesity as well as the benefit of weight loss to improve her health. She was advised of the need for long term treatment and the importance of lifestyle modifications to improve her current health and to decrease her risk of future health problems.  AGREE: Multiple dietary modification options and treatment options were discussed and  Autumn Curry agreed to follow the recommendations documented in the above note.  ARRANGE: Autumn Curry was educated on the importance of frequent visits to treat obesity as outlined per CMS and USPSTF guidelines and agreed to schedule her next follow up appointment today.  Migdalia Dk, am acting as transcriptionist for Abby Potash, PA-C I, Abby Potash, PA-C have reviewed above note and agree with its content

## 2018-09-14 LAB — VITAMIN D 25 HYDROXY (VIT D DEFICIENCY, FRACTURES): Vit D, 25-Hydroxy: 43.6 ng/mL (ref 30.0–100.0)

## 2018-09-14 LAB — COMPREHENSIVE METABOLIC PANEL
A/G RATIO: 1.3 (ref 1.2–2.2)
ALK PHOS: 71 IU/L (ref 39–117)
ALT: 14 IU/L (ref 0–32)
AST: 13 IU/L (ref 0–40)
Albumin: 4.1 g/dL (ref 3.8–4.8)
BILIRUBIN TOTAL: 0.3 mg/dL (ref 0.0–1.2)
BUN/Creatinine Ratio: 19 (ref 9–23)
BUN: 15 mg/dL (ref 6–20)
CALCIUM: 9.6 mg/dL (ref 8.7–10.2)
CHLORIDE: 101 mmol/L (ref 96–106)
CO2: 25 mmol/L (ref 20–29)
Creatinine, Ser: 0.81 mg/dL (ref 0.57–1.00)
GFR calc Af Amer: 107 mL/min/{1.73_m2} (ref >59)
GFR, EST NON AFRICAN AMERICAN: 92 mL/min/{1.73_m2} (ref >59)
GLOBULIN, TOTAL: 3.2 g/dL (ref 1.5–4.5)
GLUCOSE: 103 mg/dL — AB (ref 65–99)
POTASSIUM: 3.9 mmol/L (ref 3.5–5.2)
SODIUM: 140 mmol/L (ref 134–144)
Total Protein: 7.3 g/dL (ref 6.0–8.5)

## 2018-09-14 LAB — LIPID PANEL WITH LDL/HDL RATIO
Cholesterol, Total: 212 mg/dL — ABNORMAL HIGH (ref 100–199)
HDL: 39 mg/dL — AB (ref >39)
LDL Calculated: 154 mg/dL — ABNORMAL HIGH (ref 0–99)
LDL/HDL RATIO: 3.9 ratio — AB (ref 0.0–3.2)
Triglycerides: 97 mg/dL (ref 0–149)
VLDL Cholesterol Cal: 19 mg/dL (ref 5–40)

## 2018-09-14 LAB — HEMOGLOBIN A1C
ESTIMATED AVERAGE GLUCOSE: 114 mg/dL
Hgb A1c MFr Bld: 5.6 % (ref 4.8–5.6)

## 2018-09-14 LAB — INSULIN, RANDOM: INSULIN: 24.8 u[IU]/mL (ref 2.6–24.9)

## 2018-10-04 ENCOUNTER — Encounter (INDEPENDENT_AMBULATORY_CARE_PROVIDER_SITE_OTHER): Payer: Self-pay | Admitting: Physician Assistant

## 2018-10-04 ENCOUNTER — Ambulatory Visit (INDEPENDENT_AMBULATORY_CARE_PROVIDER_SITE_OTHER): Payer: Managed Care, Other (non HMO) | Admitting: Physician Assistant

## 2018-10-04 ENCOUNTER — Other Ambulatory Visit: Payer: Self-pay

## 2018-10-04 VITALS — BP 115/78 | HR 77 | Temp 97.9°F | Ht 62.0 in | Wt 244.0 lb

## 2018-10-04 DIAGNOSIS — E559 Vitamin D deficiency, unspecified: Secondary | ICD-10-CM | POA: Diagnosis not present

## 2018-10-04 DIAGNOSIS — I10 Essential (primary) hypertension: Secondary | ICD-10-CM | POA: Diagnosis not present

## 2018-10-04 DIAGNOSIS — Z6841 Body Mass Index (BMI) 40.0 and over, adult: Secondary | ICD-10-CM

## 2018-10-04 DIAGNOSIS — Z9189 Other specified personal risk factors, not elsewhere classified: Secondary | ICD-10-CM | POA: Diagnosis not present

## 2018-10-04 MED ORDER — VITAMIN D (ERGOCALCIFEROL) 1.25 MG (50000 UNIT) PO CAPS: 50000.0000 [IU] | ORAL_CAPSULE | ORAL | 0 refills | Status: DC

## 2018-10-04 NOTE — Progress Notes (Signed)
Office: [phone]  /  Fax: [phone]   HPI:   Chief Complaint: OBESITY Autumn Curry is here to discuss her progress with her obesity treatment plan. She is on the Category 2 plan and is following her eating plan approximately 90 % of the time. She states she is doing cardio and resistance for 30 minutes 5 times per week. [name] did well with weight loss. She is very focused on eating on the plan and exercising.  Her weight is 244 lb (110.7 kg) today and has had a weight loss of 1 pound over a period of 3 weeks since her last visit. She has lost 9 lbs since starting treatment with [place].  Vitamin D Deficiency Autumn Curry has a diagnosis of vitamin D deficiency. She is currently taking prescription Vit D. She denies nausea, vomiting or muscle weakness.  Hypertension Autumn Curry is a 38 y.o. female with hypertension. [name] is on hydrochlorothiazide and atenolol. She denies chest pain and her blood pressure is normal. She is working on weight loss to help control her blood pressure with the goal of decreasing her risk of heart attack and stroke. [name]'s blood pressure is currently controlled.  At risk for cardiovascular disease [name] is at a higher than average risk for cardiovascular disease due to obesity and hypertension. She currently denies any chest pain.  ALLERGIES: No Known Allergies  MEDICATIONS: Current Outpatient Medications on File Prior to Visit  Medication Sig Dispense Refill  . aspirin-acetaminophen-caffeine (EXCEDRIN MIGRAINE) 250-250-65 MG tablet Take by mouth every 6 (six) hours as needed for headache.    [name] atenolol (TENORMIN) 25 MG tablet Take 1 tablet (25 mg total) by mouth daily. 90 tablet 1  . Darunavir-Cobicisctat-Emtricitabine-Tenofovir Alafenamide (SYMTUZA) 800-150-200-10 MG TABS Take 1 tablet by mouth daily with breakfast. 30 tablet 11  . eletriptan (RELPAX) 20 MG tablet Take 1 tablet (20 mg total) by mouth as needed for migraine or headache. One tablet by  mouth at onset of headache. May repeat in 2 hours if headache persists or recurs. 10 tablet 0  . hydrochlorothiazide (HYDRODIURIL) 25 MG tablet TAKE 1 TABLET BY MOUTH ONCE DAILY 90 tablet 1  . Ibuprofen 200 MG CAPS Take by mouth.    . loratadine (CLARITIN) 10 MG tablet Take 10 mg by mouth daily.    . Multiple Vitamins-Minerals (ALIVE WOMENS ENERGY) TABS Take 1 tablet by mouth daily.    . Omega-3-6-9 CAPS Take by mouth daily.    . valACYclovir (VALTREX) 500 MG tablet Take 1 tablet (500 mg total) by mouth 2 (two) times daily. 10 tablet 5   No current facility-administered medications on file prior to visit.     PAST MEDICAL HISTORY: Past Medical History:  Diagnosis Date  . Absence of menstruation   . Carbuncle and furuncle of unspecified site   . Elevated blood pressure reading without diagnosis of hypertension   . Encounter for long-term (current) use of other medications   . Headache(784.0)   . HIV infection ([place]) 2008   dx AIDS  . Hyperlipidemia   . Hypertension   . Routine general medical examination at a health care facility   . Screening for malignant neoplasm of the cervix   . Syphilis, unspecified     PAST SURGICAL HISTORY: History reviewed. No pertinent surgical history.  SOCIAL HISTORY: Social History   Tobacco Use  . Smoking status: Never Smoker  . Smokeless tobacco: Never Used  Substance Use Topics  . Alcohol use: No    Alcohol/week: 0.0 standard  drinks  . Drug use: No    FAMILY HISTORY: Family History  Problem Relation Age of Onset  . Hyperlipidemia Mother   . Hypertension Mother   . Depression Mother   . Coronary artery disease Father   . Diabetes Father   . Heart disease Father   . Hyperlipidemia Father   . Stroke Father   . Kidney disease Father     ROS: Review of Systems  Constitutional: Positive for weight loss.  Cardiovascular: Negative for chest pain.  Gastrointestinal: Negative for nausea and vomiting.  Musculoskeletal:       Negative  muscle weakness    PHYSICAL EXAM: Blood pressure 115/78, pulse 77, temperature 97.9 F (36.6 C), temperature source Oral, height 5\' 2"  (1.575 m), weight 244 lb (110.7 kg), last menstrual period 09/11/2018, SpO2 98 %. Body mass index is 44.63 kg/m. Physical Exam Vitals signs reviewed.  Constitutional:      Appearance: Normal appearance. She is obese.  Cardiovascular:     Rate and Rhythm: Normal rate.     Pulses: Normal pulses.  Pulmonary:     Effort: Pulmonary effort is normal.     Breath sounds: Normal breath sounds.  Musculoskeletal: Normal range of motion.  Skin:    General: Skin is warm and dry.  Neurological:     Mental Status: She is alert and oriented to person, place, and time.  Psychiatric:        Mood and Affect: Mood normal.        Behavior: Behavior normal.     RECENT LABS AND TESTS: BMET    Component Value Date/Time   NA 140 09/13/2018 1041   K 3.9 09/13/2018 1041   CL 101 09/13/2018 1041   CO2 25 09/13/2018 1041   GLUCOSE 103 (H) 09/13/2018 1041   GLUCOSE 90 04/07/2018 1639   BUN 15 09/13/2018 1041   CREATININE 0.81 09/13/2018 1041   CREATININE 0.80 04/07/2018 1639   CALCIUM 9.6 09/13/2018 1041   GFRNONAA 92 09/13/2018 1041   GFRNONAA 94 04/07/2018 1639   GFRAA 107 09/13/2018 1041   GFRAA 108 04/07/2018 1639   Lab Results  Component Value Date   HGBA1C 5.6 09/13/2018   HGBA1C 5.7 (H) 04/22/2018   HGBA1C 5.7 (H) 12/14/2017   HGBA1C 5.6 05/25/2017   Lab Results  Component Value Date   INSULIN 24.8 09/13/2018   INSULIN 28.7 (H) 04/22/2018   INSULIN 31.4 (H) 12/14/2017   CBC    Component Value Date/Time   WBC 7.2 04/07/2018 1639   RBC 4.10 04/07/2018 1639   HGB 12.1 04/07/2018 1639   HGB 12.5 12/14/2017 1131   HCT 35.6 04/07/2018 1639   HCT 39.3 12/14/2017 1131   PLT 351 04/07/2018 1639   MCV 86.8 04/07/2018 1639   MCV 92 12/14/2017 1131   MCH 29.5 04/07/2018 1639   MCHC 34.0 04/07/2018 1639   RDW 12.7 04/07/2018 1639   RDW 14.0  12/14/2017 1131   LYMPHSABS 3,269 04/07/2018 1639   LYMPHSABS 3.6 (H) 12/14/2017 1131   MONOABS 348 06/12/2016 1545   EOSABS 86 04/07/2018 1639   EOSABS 0.2 12/14/2017 1131   BASOSABS 50 04/07/2018 1639   BASOSABS 0.0 12/14/2017 1131   Iron/TIBC/Ferritin/ %Sat No results found for: IRON, TIBC, FERRITIN, IRONPCTSAT Lipid Panel     Component Value Date/Time   CHOL 212 (H) 09/13/2018 1041   TRIG 97 09/13/2018 1041   HDL 39 (L) 09/13/2018 1041   CHOLHDL 6 01/26/2018 1814   VLDL 25.6 01/26/2018  1814   LDLCALC 154 (H) 09/13/2018 1041   LDLCALC 152 (H) 11/20/2017 1647   Hepatic Function Panel     Component Value Date/Time   PROT 7.3 09/13/2018 1041   ALBUMIN 4.1 09/13/2018 1041   AST 13 09/13/2018 1041   ALT 14 09/13/2018 1041   ALKPHOS 71 09/13/2018 1041   BILITOT 0.3 09/13/2018 1041      Component Value Date/Time   TSH 1.710 12/14/2017 1131   TSH 1.420 05/25/2017 0921   TSH 1.96 08/18/2016 1624    ASSESSMENT AND PLAN: Vitamin D deficiency - Plan: Vitamin D, Ergocalciferol, (DRISDOL) 1.25 MG (50000 UT) CAPS capsule  Essential hypertension  At risk for heart disease  Class 3 severe obesity with serious comorbidity and body mass index (BMI) of 40.0 to 44.9 in adult, unspecified obesity type ([facility])  PLAN:  Vitamin D Deficiency Makara was informed that low vitamin D levels contributes to fatigue and are associated with obesity, breast, and colon cancer. Noor agrees to continue taking prescription Vit D @50 ,000 IU every week #4 and we will refill for 1 month. She will follow up for routine testing of vitamin D, at least 2-3 times per year. She was informed of the risk of over-replacement of vitamin D and agrees to not increase her dose unless she discusses this with [place] first. Quinlee agrees to follow up with our clinic in 2 to 3 weeks.  Hypertension We discussed sodium restriction, working on healthy weight loss, and a regular exercise program as the means to achieve  improved blood pressure control. Arriel agreed with this plan and agreed to follow up as directed. We will continue to monitor her blood pressure as well as her progress with the above lifestyle modifications. Yanna agrees to continue her medications and weight loss, and will watch for signs of hypotension as she continues her lifestyle modifications. Willie agrees to follow up with our clinic in 2 to 3 weeks.  Cardiovascular risk counseling Jasime was given extended (15 minutes) coronary artery disease prevention counseling today. She is 38 y.o. female and has risk factors for heart disease including obesity and hypertension. We discussed intensive lifestyle modifications today with an emphasis on specific weight loss instructions and strategies. Pt was also informed of the importance of increasing exercise and decreasing saturated fats to help prevent heart disease.  Obesity Ida is currently in the action stage of change. As such, her goal is to continue with weight loss efforts She has agreed to follow the Category 2 plan Jelisha has been instructed to work up to a goal of 150 minutes of combined cardio and strengthening exercise per week for weight loss and overall health benefits. We discussed the following Behavioral Modification Strategies today: work on meal planning and easy cooking plans and keeping healthy foods in the home   Williemae has agreed to follow up with our clinic in 2 to 3 weeks. She was informed of the importance of frequent follow up visits to maximize her success with intensive lifestyle modifications for her multiple health conditions.   OBESITY BEHAVIORAL INTERVENTION VISIT  Today's visit was # 17   Starting weight: 253 lbs Starting date: 12/14/17 Today's weight : 244 lbs  Today's date: 10/04/2018 Total lbs lost to date: 9    10/04/2018  Height 5\' 2"  (1.575 m)  Weight 244 lb (110.7 kg)  BMI (Calculated) 44.62  BLOOD PRESSURE - SYSTOLIC 149  BLOOD PRESSURE  - DIASTOLIC 78   Body Fat % 49 %  Total  Body Water (lbs) 86 lbs     ASK: We discussed the diagnosis of obesity with Geryl Councilman today and Everlyn agreed to give [place] permission to discuss obesity behavioral modification therapy today.  ASSESS: Emberlee has the diagnosis of obesity and her BMI today is 44.62 Swayzie is in the action stage of change   ADVISE: Mekhia was educated on the multiple health risks of obesity as well as the benefit of weight loss to improve her health. She was advised of the need for long term treatment and the importance of lifestyle modifications.  AGREE: Multiple dietary modification options and treatment options were discussed and  Deavion agreed to the above obesity treatment plan.  Wilhemena Durie, am acting as transcriptionist for Abby Potash, PA-C I, Abby Potash, PA-C have reviewed above note and agree with its content

## 2018-10-05 ENCOUNTER — Encounter (INDEPENDENT_AMBULATORY_CARE_PROVIDER_SITE_OTHER): Payer: Self-pay

## 2018-10-25 ENCOUNTER — Other Ambulatory Visit: Payer: Self-pay

## 2018-10-25 ENCOUNTER — Ambulatory Visit (INDEPENDENT_AMBULATORY_CARE_PROVIDER_SITE_OTHER): Payer: Managed Care, Other (non HMO) | Admitting: Physician Assistant

## 2018-10-25 ENCOUNTER — Encounter (INDEPENDENT_AMBULATORY_CARE_PROVIDER_SITE_OTHER): Payer: Self-pay | Admitting: Physician Assistant

## 2018-10-25 DIAGNOSIS — E559 Vitamin D deficiency, unspecified: Secondary | ICD-10-CM | POA: Diagnosis not present

## 2018-10-25 DIAGNOSIS — Z6841 Body Mass Index (BMI) 40.0 and over, adult: Secondary | ICD-10-CM

## 2018-10-25 DIAGNOSIS — E7849 Other hyperlipidemia: Secondary | ICD-10-CM | POA: Diagnosis not present

## 2018-10-25 MED ORDER — VITAMIN D (ERGOCALCIFEROL) 1.25 MG (50000 UNIT) PO CAPS: 50000.0000 [IU] | ORAL_CAPSULE | ORAL | 0 refills | Status: DC

## 2018-10-25 NOTE — Progress Notes (Signed)
Office: [phone]  /  Fax: [phone] TeleHealth Visit:  Autumn Curry has verbally consented to this TeleHealth visit today. The patient is located at home, the provider is located at the [facility] office. The participants in this visit include the listed provider and patient. The visit was conducted today via FaceTime.  HPI:   Chief Complaint: OBESITY Autumn Curry is here to discuss her progress with her obesity treatment plan. She is on the Category 2 plan and is following her eating plan approximately 85% of the time. She states she is exercising using the [employer]/app 30 minutes 3 times per week. Genese reports that she is working out more often and has been more hungry than normal. She states she is enjoying the plan. We were unable to weigh the patient today for this TeleHealth visit. She feels as if she has maintained her weight since her last visit. She has lost 9 lbs since starting treatment with [place].  Vitamin D deficiency Autumn Curry has a diagnosis of Vitamin D deficiency. She is currently taking prescription Vit D and denies nausea, vomiting or muscle weakness.  Hyperlipidemia Autumn Curry has hyperlipidemia and is on Omega-3 capsules. She has been trying to improve her cholesterol levels with intensive lifestyle modification including a low saturated fat diet, exercise and weight loss. She denies any chest pain, claudication or myalgias.  ASSESSMENT AND PLAN:  Vitamin D deficiency - Plan: Vitamin D, Ergocalciferol, (DRISDOL) 1.25 MG (50000 UT) CAPS capsule  Other hyperlipidemia  Class 3 severe obesity with serious comorbidity and body mass index (BMI) of 40.0 to 44.9 in adult, unspecified obesity type ([place])  PLAN:  Vitamin D Deficiency Autumn Curry was informed that low Vitamin D levels contributes to fatigue and are associated with obesity, breast, and colon cancer. She agrees to continue to take prescription Vit D @ 50,000 IU every week #4 with 0 refills  and will follow-up for routine testing of Vitamin D, at least 2-3 times per year. She was informed of the risk of over-replacement of Vitamin D and agrees to not increase her dose unless she discusses this with [place] first. Autumn Curry agrees to follow-up with our clinic in 2 weeks.  Hyperlipidemia Autumn Curry was informed of the American Heart Association Guidelines emphasizing intensive lifestyle modifications as the first line treatment for hyperlipidemia. We discussed many lifestyle modifications today in depth, and Autumn Curry will continue to work on decreasing saturated fats such as fatty red meat, butter and many fried foods. She will also increase vegetables and lean protein in her diet and continue to work on exercise and weight loss efforts.  Obesity Autumn Curry is currently in the action stage of change. As such, her goal is to continue with weight loss efforts. She has agreed to follow the Category 2 plan. Autumn Curry has been instructed to work up to a goal of 150 minutes of combined cardio and strengthening exercise per week for weight loss and overall health benefits. We discussed the following Behavioral Modification Strategies today: work on meal planning, easy cooking plans, and better snacking choices.  Autumn Curry has agreed to follow-up with our clinic in 2 weeks. She was informed of the importance of frequent follow-up visits to maximize her success with intensive lifestyle modifications for her multiple health conditions.  ALLERGIES: No Known Allergies  MEDICATIONS: Current Outpatient Medications on File Prior to Visit  Medication Sig Dispense Refill  . aspirin-acetaminophen-caffeine (EXCEDRIN MIGRAINE) 250-250-65 MG tablet Take by mouth every 6 (six) hours as needed for headache.    Autumn Curry  atenolol (TENORMIN) 25 MG tablet Take 1 tablet (25 mg total) by mouth daily. 90 tablet 1  . Darunavir-Cobicisctat-Emtricitabine-Tenofovir Alafenamide (SYMTUZA) 800-150-200-10 MG TABS Take 1 tablet by mouth daily  with breakfast. 30 tablet 11  . eletriptan (RELPAX) 20 MG tablet Take 1 tablet (20 mg total) by mouth as needed for migraine or headache. One tablet by mouth at onset of headache. May repeat in 2 hours if headache persists or recurs. 10 tablet 0  . hydrochlorothiazide (HYDRODIURIL) 25 MG tablet TAKE 1 TABLET BY MOUTH ONCE DAILY 90 tablet 1  . Ibuprofen 200 MG CAPS Take by mouth.    . loratadine (CLARITIN) 10 MG tablet Take 10 mg by mouth daily.    . Multiple Vitamins-Minerals (ALIVE WOMENS ENERGY) TABS Take 1 tablet by mouth daily.    . Omega-3-6-9 CAPS Take by mouth daily.    . valACYclovir (VALTREX) 500 MG tablet Take 1 tablet (500 mg total) by mouth 2 (two) times daily. 10 tablet 5   No current facility-administered medications on file prior to visit.     PAST MEDICAL HISTORY: Past Medical History:  Diagnosis Date  . Absence of menstruation   . Carbuncle and furuncle of unspecified site   . Elevated blood pressure reading without diagnosis of hypertension   . Encounter for long-term (current) use of other medications   . Headache(784.0)   . HIV infection ([place]) 2008   dx AIDS  . Hyperlipidemia   . Hypertension   . Routine general medical examination at a health care facility   . Screening for malignant neoplasm of the cervix   . Syphilis, unspecified     PAST SURGICAL HISTORY: History reviewed. No pertinent surgical history.  SOCIAL HISTORY: Social History   Tobacco Use  . Smoking status: Never Smoker  . Smokeless tobacco: Never Used  Substance Use Topics  . Alcohol use: No    Alcohol/week: 0.0 standard drinks  . Drug use: No    FAMILY HISTORY: Family History  Problem Relation Age of Onset  . Hyperlipidemia Mother   . Hypertension Mother   . Depression Mother   . Coronary artery disease Father   . Diabetes Father   . Heart disease Father   . Hyperlipidemia Father   . Stroke Father   . Kidney disease Father    ROS: Review of Systems  Cardiovascular:  Negative for chest pain.  Gastrointestinal: Negative for nausea and vomiting.  Musculoskeletal: Negative for myalgias.       Negative for muscle weakness.   PHYSICAL EXAM: Pt in no acute distress  RECENT LABS AND TESTS: BMET    Component Value Date/Time   NA 140 09/13/2018 1041   K 3.9 09/13/2018 1041   CL 101 09/13/2018 1041   CO2 25 09/13/2018 1041   GLUCOSE 103 (H) 09/13/2018 1041   GLUCOSE 90 04/07/2018 1639   BUN 15 09/13/2018 1041   CREATININE 0.81 09/13/2018 1041   CREATININE 0.80 04/07/2018 1639   CALCIUM 9.6 09/13/2018 1041   GFRNONAA 92 09/13/2018 1041   GFRNONAA 94 04/07/2018 1639   GFRAA 107 09/13/2018 1041   GFRAA 108 04/07/2018 1639   Lab Results  Component Value Date   HGBA1C 5.6 09/13/2018   HGBA1C 5.7 (H) 04/22/2018   HGBA1C 5.7 (H) 12/14/2017   HGBA1C 5.6 05/25/2017   Lab Results  Component Value Date   INSULIN 24.8 09/13/2018   INSULIN 28.7 (H) 04/22/2018   INSULIN 31.4 (H) 12/14/2017   CBC    Component Value  Date/Time   WBC 7.2 04/07/2018 1639   RBC 4.10 04/07/2018 1639   HGB 12.1 04/07/2018 1639   HGB 12.5 12/14/2017 1131   HCT 35.6 04/07/2018 1639   HCT 39.3 12/14/2017 1131   PLT 351 04/07/2018 1639   MCV 86.8 04/07/2018 1639   MCV 92 12/14/2017 1131   MCH 29.5 04/07/2018 1639   MCHC 34.0 04/07/2018 1639   RDW 12.7 04/07/2018 1639   RDW 14.0 12/14/2017 1131   LYMPHSABS 3,269 04/07/2018 1639   LYMPHSABS 3.6 (H) 12/14/2017 1131   MONOABS 348 06/12/2016 1545   EOSABS 86 04/07/2018 1639   EOSABS 0.2 12/14/2017 1131   BASOSABS 50 04/07/2018 1639   BASOSABS 0.0 12/14/2017 1131   Iron/TIBC/Ferritin/ %Sat No results found for: IRON, TIBC, FERRITIN, IRONPCTSAT Lipid Panel     Component Value Date/Time   CHOL 212 (H) 09/13/2018 1041   TRIG 97 09/13/2018 1041   HDL 39 (L) 09/13/2018 1041   CHOLHDL 6 01/26/2018 1814   VLDL 25.6 01/26/2018 1814   LDLCALC 154 (H) 09/13/2018 1041   LDLCALC 152 (H) 11/20/2017 1647   Hepatic Function  Panel     Component Value Date/Time   PROT 7.3 09/13/2018 1041   ALBUMIN 4.1 09/13/2018 1041   AST 13 09/13/2018 1041   ALT 14 09/13/2018 1041   ALKPHOS 71 09/13/2018 1041   BILITOT 0.3 09/13/2018 1041      Component Value Date/Time   TSH 1.710 12/14/2017 1131   TSH 1.420 05/25/2017 0921   TSH 1.96 08/18/2016 1624   Results for AMRUTHA, AVERA F (MRN [id number]) as of 10/25/2018 11:08  Ref. Range 09/13/2018 10:41  Vitamin D, 25-Hydroxy Latest Ref Range: 30.0 - 100.0 ng/mL 43.6    I, Michaelene Song, am acting as Location manager for [employer], PA-C I, Abby Potash, PA-C have reviewed above note and agree with its content

## 2018-10-30 ENCOUNTER — Other Ambulatory Visit: Payer: Self-pay | Admitting: Family Medicine

## 2018-10-30 DIAGNOSIS — I10 Essential (primary) hypertension: Secondary | ICD-10-CM

## 2018-11-09 ENCOUNTER — Encounter (INDEPENDENT_AMBULATORY_CARE_PROVIDER_SITE_OTHER): Payer: Self-pay | Admitting: Physician Assistant

## 2018-11-09 ENCOUNTER — Ambulatory Visit (INDEPENDENT_AMBULATORY_CARE_PROVIDER_SITE_OTHER): Payer: Managed Care, Other (non HMO) | Admitting: Physician Assistant

## 2018-11-09 ENCOUNTER — Other Ambulatory Visit: Payer: Self-pay

## 2018-11-09 DIAGNOSIS — Z6841 Body Mass Index (BMI) 40.0 and over, adult: Secondary | ICD-10-CM | POA: Diagnosis not present

## 2018-11-09 DIAGNOSIS — E559 Vitamin D deficiency, unspecified: Secondary | ICD-10-CM

## 2018-11-09 NOTE — Progress Notes (Signed)
Office: [phone]  /  Fax: [phone] TeleHealth Visit:  Autumn Curry has verbally consented to this TeleHealth visit today. The patient is located at home, the provider is located at the [facility] office. The participants in this visit include the listed provider and patient. The visit was conducted today via Webex.  HPI:   Chief Complaint: OBESITY Autumn Curry is here to discuss her progress with her obesity treatment plan. She is on the Category 2 plan and is following her eating plan approximately 90% of the time. She states she is doing cardio/weights 30 minutes 3 times per week. Autumn Curry is doing well overall. She reports that she hasn't been walking as much. We were unable to weigh the patient today for this TeleHealth visit. She feels as if she has lost 3 lbs since her last visit. She has lost 9 lbs since starting treatment with [place].  Vitamin D deficiency Autumn Curry has a diagnosis of Vitamin D deficiency. She is currently taking Vit D and denies nausea, vomiting or muscle weakness.  ASSESSMENT AND PLAN:  Vitamin D deficiency  Class 3 severe obesity with serious comorbidity and body mass index (BMI) of 40.0 to 44.9 in adult, unspecified obesity type ([place])  PLAN:  Vitamin D Deficiency Autumn Curry was informed that low Vitamin D levels contributes to fatigue and are associated with obesity, breast, and colon cancer. She agrees to continue taking Vit D and will follow-up for routine testing of Vitamin D, at least 2-3 times per year. She was informed of the risk of over-replacement of Vitamin D and agrees to not increase her dose unless she discusses this with [place] first. Autumn Curry agrees to follow-up with our clinic in 2 weeks.  Obesity Autumn Curry is currently in the action stage of change. As such, her goal is to continue with weight loss efforts. She has agreed to follow the Category 2 plan. Autumn Curry has been instructed to work up to a goal of 150 minutes of combined  cardio and strengthening exercise per week for weight loss and overall health benefits. We discussed the following Behavioral Modification Strategies today: work on meal planning, easy cooking plans, and keeping healthy foods in the home.  Autumn Curry has agreed to follow-up with our clinic in 2 weeks. She was informed of the importance of frequent follow-up visits to maximize her success with intensive lifestyle modifications for her multiple health conditions.  ALLERGIES: No Known Allergies  MEDICATIONS: Current Outpatient Medications on File Prior to Visit  Medication Sig Dispense Refill  . aspirin-acetaminophen-caffeine (EXCEDRIN MIGRAINE) 250-250-65 MG tablet Take by mouth every 6 (six) hours as needed for headache.    [name] atenolol (TENORMIN) 25 MG tablet Take 1 tablet by mouth once daily 90 tablet 1  . Darunavir-Cobicisctat-Emtricitabine-Tenofovir Alafenamide (SYMTUZA) 800-150-200-10 MG TABS Take 1 tablet by mouth daily with breakfast. 30 tablet 11  . eletriptan (RELPAX) 20 MG tablet Take 1 tablet (20 mg total) by mouth as needed for migraine or headache. One tablet by mouth at onset of headache. May repeat in 2 hours if headache persists or recurs. 10 tablet 0  . hydrochlorothiazide (HYDRODIURIL) 25 MG tablet TAKE 1 TABLET BY MOUTH ONCE DAILY 90 tablet 1  . Ibuprofen 200 MG CAPS Take by mouth.    . loratadine (CLARITIN) 10 MG tablet Take 10 mg by mouth daily.    . Multiple Vitamins-Minerals (ALIVE WOMENS ENERGY) TABS Take 1 tablet by mouth daily.    . Omega-3-6-9 CAPS Take by mouth daily.    [name]  valACYclovir (VALTREX) 500 MG tablet Take 1 tablet (500 mg total) by mouth 2 (two) times daily. 10 tablet 5  . Vitamin D, Ergocalciferol, (DRISDOL) 1.25 MG (50000 UT) CAPS capsule Take 1 capsule (50,000 Units total) by mouth every 7 (seven) days. 4 capsule 0   No current facility-administered medications on file prior to visit.     PAST MEDICAL HISTORY: Past Medical History:  Diagnosis Date  .  Absence of menstruation   . Carbuncle and furuncle of unspecified site   . Elevated blood pressure reading without diagnosis of hypertension   . Encounter for long-term (current) use of other medications   . Headache(784.0)   . HIV infection ([place]) 2008   dx AIDS  . Hyperlipidemia   . Hypertension   . Routine general medical examination at a health care facility   . Screening for malignant neoplasm of the cervix   . Syphilis, unspecified     PAST SURGICAL HISTORY: History reviewed. No pertinent surgical history.  SOCIAL HISTORY: Social History   Tobacco Use  . Smoking status: Never Smoker  . Smokeless tobacco: Never Used  Substance Use Topics  . Alcohol use: No    Alcohol/week: 0.0 standard drinks  . Drug use: No    FAMILY HISTORY: Family History  Problem Relation Age of Onset  . Hyperlipidemia Mother   . Hypertension Mother   . Depression Mother   . Coronary artery disease Father   . Diabetes Father   . Heart disease Father   . Hyperlipidemia Father   . Stroke Father   . Kidney disease Father    ROS: Review of Systems  Gastrointestinal: Negative for nausea and vomiting.  Musculoskeletal:       Negative for muscle weakness.   PHYSICAL EXAM: Pt in no acute distress  RECENT LABS AND TESTS: BMET    Component Value Date/Time   NA 140 09/13/2018 1041   K 3.9 09/13/2018 1041   CL 101 09/13/2018 1041   CO2 25 09/13/2018 1041   GLUCOSE 103 (H) 09/13/2018 1041   GLUCOSE 90 04/07/2018 1639   BUN 15 09/13/2018 1041   CREATININE 0.81 09/13/2018 1041   CREATININE 0.80 04/07/2018 1639   CALCIUM 9.6 09/13/2018 1041   GFRNONAA 92 09/13/2018 1041   GFRNONAA 94 04/07/2018 1639   GFRAA 107 09/13/2018 1041   GFRAA 108 04/07/2018 1639   Lab Results  Component Value Date   HGBA1C 5.6 09/13/2018   HGBA1C 5.7 (H) 04/22/2018   HGBA1C 5.7 (H) 12/14/2017   HGBA1C 5.6 05/25/2017   Lab Results  Component Value Date   INSULIN 24.8 09/13/2018   INSULIN 28.7 (H)  04/22/2018   INSULIN 31.4 (H) 12/14/2017   CBC    Component Value Date/Time   WBC 7.2 04/07/2018 1639   RBC 4.10 04/07/2018 1639   HGB 12.1 04/07/2018 1639   HGB 12.5 12/14/2017 1131   HCT 35.6 04/07/2018 1639   HCT 39.3 12/14/2017 1131   PLT 351 04/07/2018 1639   MCV 86.8 04/07/2018 1639   MCV 92 12/14/2017 1131   MCH 29.5 04/07/2018 1639   MCHC 34.0 04/07/2018 1639   RDW 12.7 04/07/2018 1639   RDW 14.0 12/14/2017 1131   LYMPHSABS 3,269 04/07/2018 1639   LYMPHSABS 3.6 (H) 12/14/2017 1131   MONOABS 348 06/12/2016 1545   EOSABS 86 04/07/2018 1639   EOSABS 0.2 12/14/2017 1131   BASOSABS 50 04/07/2018 1639   BASOSABS 0.0 12/14/2017 1131   Iron/TIBC/Ferritin/ %Sat No results found for: IRON,  TIBC, FERRITIN, IRONPCTSAT Lipid Panel     Component Value Date/Time   CHOL 212 (H) 09/13/2018 1041   TRIG 97 09/13/2018 1041   HDL 39 (L) 09/13/2018 1041   CHOLHDL 6 01/26/2018 1814   VLDL 25.6 01/26/2018 1814   LDLCALC 154 (H) 09/13/2018 1041   LDLCALC 152 (H) 11/20/2017 1647   Hepatic Function Panel     Component Value Date/Time   PROT 7.3 09/13/2018 1041   ALBUMIN 4.1 09/13/2018 1041   AST 13 09/13/2018 1041   ALT 14 09/13/2018 1041   ALKPHOS 71 09/13/2018 1041   BILITOT 0.3 09/13/2018 1041      Component Value Date/Time   TSH 1.710 12/14/2017 1131   TSH 1.420 05/25/2017 0921   TSH 1.96 08/18/2016 1624   Results for Autumn, Curry (MRN [id number]) as of 11/09/2018 16:11  Ref. Range 09/13/2018 10:41  Vitamin D, 25-Hydroxy Latest Ref Range: 30.0 - 100.0 ng/mL 43.6   I, Michaelene Song, am acting as Location manager for [employer], PA-C

## 2018-11-24 ENCOUNTER — Ambulatory Visit (INDEPENDENT_AMBULATORY_CARE_PROVIDER_SITE_OTHER): Payer: Managed Care, Other (non HMO) | Admitting: Physician Assistant

## 2018-11-24 ENCOUNTER — Other Ambulatory Visit: Payer: Self-pay

## 2018-11-24 DIAGNOSIS — E7849 Other hyperlipidemia: Secondary | ICD-10-CM

## 2018-11-24 DIAGNOSIS — E559 Vitamin D deficiency, unspecified: Secondary | ICD-10-CM

## 2018-11-24 DIAGNOSIS — Z6841 Body Mass Index (BMI) 40.0 and over, adult: Secondary | ICD-10-CM | POA: Diagnosis not present

## 2018-11-24 MED ORDER — VITAMIN D (ERGOCALCIFEROL) 1.25 MG (50000 UNIT) PO CAPS: 50000.0000 [IU] | ORAL_CAPSULE | ORAL | 0 refills | Status: DC

## 2018-11-25 NOTE — Progress Notes (Signed)
Office: [phone]  /  Fax: [phone] TeleHealth Visit:  Autumn Curry has verbally consented to this TeleHealth visit today. The patient is located at home, the provider is located at the [facility] office. The participants in this visit include the listed provider and patient. The visit was conducted today via Webex.  HPI:   Chief Complaint: OBESITY Autumn Curry is here to discuss her progress with her obesity treatment plan. She is on the Category 2 plan and is following her eating plan approximately 90% of the time. She states she is doing cardio 30 minutes 3-4 times per week. Ryan reports her weight today is 240 lbs. She is doing very well with the plan and reports that she is motivated to continue. We were unable to weigh the patient today for this TeleHealth visit. She states her weight was 240 lbs on 11/20/2018. She has lost 9 lbs since starting treatment with [place].  Vitamin D deficiency Autumn Curry has a diagnosis of Vitamin D deficiency. She is currently taking prescription Vit D and denies nausea, vomiting or muscle weakness.  Hyperlipidemia Autumn Curry has hyperlipidemia and has been trying to improve her cholesterol levels with intensive lifestyle modification including a low saturated fat diet, exercise and weight loss. She is on no medication and denies any chest pain.  ASSESSMENT AND PLAN:  Vitamin D deficiency - Plan: Vitamin D, Ergocalciferol, (DRISDOL) 1.25 MG (50000 UT) CAPS capsule  Other hyperlipidemia  Class 3 severe obesity with serious comorbidity and body mass index (BMI) of 40.0 to 44.9 in adult, unspecified obesity type (Autumn Curry)  PLAN:  Vitamin D Deficiency Autumn Curry was informed that low Vitamin D levels contributes to fatigue and are associated with obesity, breast, and colon cancer. She agrees to continue to take prescription Vit D @ 50,000 IU every week #4 with 0 refills and will follow-up for routine testing of Vitamin D, at least 2-3 times per  year. She was informed of the risk of over-replacement of Vitamin D and agrees to not increase her dose unless she discusses this with [place] first. Autumn Curry agrees to follow-up with our clinic in 2 weeks.  Hyperlipidemia Autumn Curry was informed of the American Heart Association Guidelines emphasizing intensive lifestyle modifications as the first line treatment for hyperlipidemia. We discussed many lifestyle modifications today in depth, and Autumn Curry will continue to work on decreasing saturated fats such as fatty red meat, butter and many fried foods. She will also increase vegetables and lean protein in her diet and continue to work on exercise and weight loss efforts.  Obesity Autumn Curry is currently in the action stage of change. As such, her goal is to continue with weight loss efforts. She has agreed to follow the Category 2 plan. Autumn Curry has been instructed to work up to a goal of 150 minutes of combined cardio and strengthening exercise per week for weight loss and overall health benefits. We discussed the following Behavioral Modification Strategies today: work on meal planning, easy cooking plans, and keeping healthy foods in the home.  Autumn Curry has agreed to follow-up with our clinic in 2 weeks. She was informed of the importance of frequent follow-up visits to maximize her success with intensive lifestyle modifications for her multiple health conditions.  ALLERGIES: No Known Allergies  MEDICATIONS: Current Outpatient Medications on File Prior to Visit  Medication Sig Dispense Refill  . aspirin-acetaminophen-caffeine (EXCEDRIN MIGRAINE) 250-250-65 MG tablet Take by mouth every 6 (six) hours as needed for headache.    [name] atenolol (TENORMIN) 25 MG  tablet Take 1 tablet by mouth once daily 90 tablet 1  . Darunavir-Cobicisctat-Emtricitabine-Tenofovir Alafenamide (SYMTUZA) 800-150-200-10 MG TABS Take 1 tablet by mouth daily with breakfast. 30 tablet 11  . eletriptan (RELPAX) 20 MG tablet Take 1 tablet  (20 mg total) by mouth as needed for migraine or headache. One tablet by mouth at onset of headache. May repeat in 2 hours if headache persists or recurs. 10 tablet 0  . hydrochlorothiazide (HYDRODIURIL) 25 MG tablet TAKE 1 TABLET BY MOUTH ONCE DAILY 90 tablet 1  . Ibuprofen 200 MG CAPS Take by mouth.    . loratadine (CLARITIN) 10 MG tablet Take 10 mg by mouth daily.    . Multiple Vitamins-Minerals (ALIVE WOMENS ENERGY) TABS Take 1 tablet by mouth daily.    . Omega-3-6-9 CAPS Take by mouth daily.    . valACYclovir (VALTREX) 500 MG tablet Take 1 tablet (500 mg total) by mouth 2 (two) times daily. 10 tablet 5   No current facility-administered medications on file prior to visit.     PAST MEDICAL HISTORY: Past Medical History:  Diagnosis Date  . Absence of menstruation   . Carbuncle and furuncle of unspecified site   . Elevated blood pressure reading without diagnosis of hypertension   . Encounter for long-term (current) use of other medications   . Headache(784.0)   . HIV infection ([place]) 2008   dx AIDS  . Hyperlipidemia   . Hypertension   . Routine general medical examination at a health care facility   . Screening for malignant neoplasm of the cervix   . Syphilis, unspecified     PAST SURGICAL HISTORY: No past surgical history on file.  SOCIAL HISTORY: Social History   Tobacco Use  . Smoking status: Never Smoker  . Smokeless tobacco: Never Used  Substance Use Topics  . Alcohol use: No    Alcohol/week: 0.0 standard drinks  . Drug use: No    FAMILY HISTORY: Family History  Problem Relation Age of Onset  . Hyperlipidemia Mother   . Hypertension Mother   . Depression Mother   . Coronary artery disease Father   . Diabetes Father   . Heart disease Father   . Hyperlipidemia Father   . Stroke Father   . Kidney disease Father    ROS: Review of Systems  Cardiovascular: Negative for chest pain.  Gastrointestinal: Negative for nausea and vomiting.  Musculoskeletal:        Negative for muscle weakness.   PHYSICAL EXAM: Pt in no acute distress  RECENT LABS AND TESTS: BMET    Component Value Date/Time   NA 140 09/13/2018 1041   K 3.9 09/13/2018 1041   CL 101 09/13/2018 1041   CO2 25 09/13/2018 1041   GLUCOSE 103 (H) 09/13/2018 1041   GLUCOSE 90 04/07/2018 1639   BUN 15 09/13/2018 1041   CREATININE 0.81 09/13/2018 1041   CREATININE 0.80 04/07/2018 1639   CALCIUM 9.6 09/13/2018 1041   GFRNONAA 92 09/13/2018 1041   GFRNONAA 94 04/07/2018 1639   GFRAA 107 09/13/2018 1041   GFRAA 108 04/07/2018 1639   Lab Results  Component Value Date   HGBA1C 5.6 09/13/2018   HGBA1C 5.7 (H) 04/22/2018   HGBA1C 5.7 (H) 12/14/2017   HGBA1C 5.6 05/25/2017   Lab Results  Component Value Date   INSULIN 24.8 09/13/2018   INSULIN 28.7 (H) 04/22/2018   INSULIN 31.4 (H) 12/14/2017   CBC    Component Value Date/Time   WBC 7.2 04/07/2018 1639  RBC 4.10 04/07/2018 1639   HGB 12.1 04/07/2018 1639   HGB 12.5 12/14/2017 1131   HCT 35.6 04/07/2018 1639   HCT 39.3 12/14/2017 1131   PLT 351 04/07/2018 1639   MCV 86.8 04/07/2018 1639   MCV 92 12/14/2017 1131   MCH 29.5 04/07/2018 1639   MCHC 34.0 04/07/2018 1639   RDW 12.7 04/07/2018 1639   RDW 14.0 12/14/2017 1131   LYMPHSABS 3,269 04/07/2018 1639   LYMPHSABS 3.6 (H) 12/14/2017 1131   MONOABS 348 06/12/2016 1545   EOSABS 86 04/07/2018 1639   EOSABS 0.2 12/14/2017 1131   BASOSABS 50 04/07/2018 1639   BASOSABS 0.0 12/14/2017 1131   Iron/TIBC/Ferritin/ %Sat No results found for: IRON, TIBC, FERRITIN, IRONPCTSAT Lipid Panel     Component Value Date/Time   CHOL 212 (H) 09/13/2018 1041   TRIG 97 09/13/2018 1041   HDL 39 (L) 09/13/2018 1041   CHOLHDL 6 01/26/2018 1814   VLDL 25.6 01/26/2018 1814   LDLCALC 154 (H) 09/13/2018 1041   LDLCALC 152 (H) 11/20/2017 1647   Hepatic Function Panel     Component Value Date/Time   PROT 7.3 09/13/2018 1041   ALBUMIN 4.1 09/13/2018 1041   AST 13 09/13/2018 1041    ALT 14 09/13/2018 1041   ALKPHOS 71 09/13/2018 1041   BILITOT 0.3 09/13/2018 1041      Component Value Date/Time   TSH 1.710 12/14/2017 1131   TSH 1.420 05/25/2017 0921   TSH 1.96 08/18/2016 1624   Results for NICCI, VAUGHAN (MRN [id number]) as of 11/25/2018 09:13  Ref. Range 09/13/2018 10:41  Vitamin D, 25-Hydroxy Latest Ref Range: 30.0 - 100.0 ng/mL 43.6    I, Michaelene Song, am acting as Location manager for [employer], PA-C I, Abby Potash, PA-C have reviewed above note and agree with its content

## 2018-12-09 ENCOUNTER — Other Ambulatory Visit: Payer: Self-pay

## 2018-12-09 ENCOUNTER — Encounter (INDEPENDENT_AMBULATORY_CARE_PROVIDER_SITE_OTHER): Payer: Self-pay | Admitting: Physician Assistant

## 2018-12-09 ENCOUNTER — Ambulatory Visit (INDEPENDENT_AMBULATORY_CARE_PROVIDER_SITE_OTHER): Payer: Managed Care, Other (non HMO) | Admitting: Physician Assistant

## 2018-12-09 DIAGNOSIS — E559 Vitamin D deficiency, unspecified: Secondary | ICD-10-CM | POA: Diagnosis not present

## 2018-12-09 DIAGNOSIS — Z6841 Body Mass Index (BMI) 40.0 and over, adult: Secondary | ICD-10-CM

## 2018-12-12 NOTE — Progress Notes (Signed)
Office: [phone]  /  Fax: [phone] TeleHealth Visit:  Autumn Curry has verbally consented to this TeleHealth visit today. The patient is located at home, the provider is located at the [facility] office. The participants in this visit include the listed provider and patient. The visit was conducted today via webex.  HPI:   Chief Complaint: OBESITY [name] is here to discuss her progress with her obesity treatment plan. She is on the Category 2 plan and is following her eating plan approximately 85 % of the time. She states she is cardio for 30 minutes 4 times per week. [name] reports that she has been following the plan well and working out, but she is frustrated with the lack of weight loss. She has a more difficult time on Sundays, when she goes to church and then eats with her family who does a lot of "country cooking".  We were unable to weigh the patient today for this TeleHealth visit. She feels as if she has maintained her weight since her last visit. She has lost 9 lbs since starting treatment with [place].  Vitamin D Deficiency [name] has a diagnosis of vitamin D deficiency. She is currently taking prescription Vit D. She denies nausea, vomiting or muscle weakness.  ASSESSMENT AND PLAN:  Vitamin D deficiency  Class 3 severe obesity with serious comorbidity and body mass index (BMI) of 40.0 to 44.9 in adult, unspecified obesity type ([place])  PLAN:  Vitamin D Deficiency [name] was informed that low vitamin D levels contributes to fatigue and are associated with obesity, breast, and colon cancer. [name] agrees to continue taking prescription Vit D @50 ,000 IU every week and will follow up for routine testing of vitamin D, at least 2-3 times per year. She was informed of the risk of over-replacement of vitamin D and agrees to not increase her dose unless she discusses this with [place] first. [name] agrees to follow up with our clinic in 2 weeks.  Obesity  [name] is currently in the action stage of change. As such, her goal is to continue with weight loss efforts She has agreed to follow the Category 2 plan [name] has been instructed to work up to a goal of 150 minutes of combined cardio and strengthening exercise per week for weight loss and overall health benefits. We discussed the following Behavioral Modification Strategies today: decrease eating out and work on meal planning and easy cooking plans   [name] has agreed to follow up with our clinic in 2 weeks. She was informed of the importance of frequent follow up visits to maximize her success with intensive lifestyle modifications for her multiple health conditions.  ALLERGIES: No Known Allergies  MEDICATIONS: Current Outpatient Medications on File Prior to Visit  Medication Sig Dispense Refill  . aspirin-acetaminophen-caffeine (EXCEDRIN MIGRAINE) 250-250-65 MG tablet Take by mouth every 6 (six) hours as needed for headache.    [name] atenolol (TENORMIN) 25 MG tablet Take 1 tablet by mouth once daily 90 tablet 1  . Darunavir-Cobicisctat-Emtricitabine-Tenofovir Alafenamide (SYMTUZA) 800-150-200-10 MG TABS Take 1 tablet by mouth daily with breakfast. 30 tablet 11  . eletriptan (RELPAX) 20 MG tablet Take 1 tablet (20 mg total) by mouth as needed for migraine or headache. One tablet by mouth at onset of headache. May repeat in 2 hours if headache persists or recurs. 10 tablet 0  . hydrochlorothiazide (HYDRODIURIL) 25 MG tablet TAKE 1 TABLET BY MOUTH ONCE DAILY 90 tablet 1  . Ibuprofen 200 MG CAPS Take  by mouth.    . loratadine (CLARITIN) 10 MG tablet Take 10 mg by mouth daily.    . Multiple Vitamins-Minerals (ALIVE WOMENS ENERGY) TABS Take 1 tablet by mouth daily.    . Omega-3-6-9 CAPS Take by mouth daily.    . valACYclovir (VALTREX) 500 MG tablet Take 1 tablet (500 mg total) by mouth 2 (two) times daily. 10 tablet 5  . Vitamin D, Ergocalciferol, (DRISDOL) 1.25 MG (50000 UT) CAPS capsule Take  1 capsule (50,000 Units total) by mouth every 7 (seven) days. 4 capsule 0   No current facility-administered medications on file prior to visit.     PAST MEDICAL HISTORY: Past Medical History:  Diagnosis Date  . Absence of menstruation   . Carbuncle and furuncle of unspecified site   . Elevated blood pressure reading without diagnosis of hypertension   . Encounter for long-term (current) use of other medications   . Headache(784.0)   . HIV infection ([place]) 2008   dx AIDS  . Hyperlipidemia   . Hypertension   . Routine general medical examination at a health care facility   . Screening for malignant neoplasm of the cervix   . Syphilis, unspecified     PAST SURGICAL HISTORY: History reviewed. No pertinent surgical history.  SOCIAL HISTORY: Social History   Tobacco Use  . Smoking status: Never Smoker  . Smokeless tobacco: Never Used  Substance Use Topics  . Alcohol use: No    Alcohol/week: 0.0 standard drinks  . Drug use: No    FAMILY HISTORY: Family History  Problem Relation Age of Onset  . Hyperlipidemia Mother   . Hypertension Mother   . Depression Mother   . Coronary artery disease Father   . Diabetes Father   . Heart disease Father   . Hyperlipidemia Father   . Stroke Father   . Kidney disease Father     ROS: Review of Systems  Constitutional: Negative for weight loss.  Gastrointestinal: Negative for nausea and vomiting.  Musculoskeletal:       Negative muscle weakness    PHYSICAL EXAM: Pt in no acute distress  RECENT LABS AND TESTS: BMET    Component Value Date/Time   NA 140 09/13/2018 1041   K 3.9 09/13/2018 1041   CL 101 09/13/2018 1041   CO2 25 09/13/2018 1041   GLUCOSE 103 (H) 09/13/2018 1041   GLUCOSE 90 04/07/2018 1639   BUN 15 09/13/2018 1041   CREATININE 0.81 09/13/2018 1041   CREATININE 0.80 04/07/2018 1639   CALCIUM 9.6 09/13/2018 1041   GFRNONAA 92 09/13/2018 1041   GFRNONAA 94 04/07/2018 1639   GFRAA 107 09/13/2018 1041    GFRAA 108 04/07/2018 1639   Lab Results  Component Value Date   HGBA1C 5.6 09/13/2018   HGBA1C 5.7 (H) 04/22/2018   HGBA1C 5.7 (H) 12/14/2017   HGBA1C 5.6 05/25/2017   Lab Results  Component Value Date   INSULIN 24.8 09/13/2018   INSULIN 28.7 (H) 04/22/2018   INSULIN 31.4 (H) 12/14/2017   CBC    Component Value Date/Time   WBC 7.2 04/07/2018 1639   RBC 4.10 04/07/2018 1639   HGB 12.1 04/07/2018 1639   HGB 12.5 12/14/2017 1131   HCT 35.6 04/07/2018 1639   HCT 39.3 12/14/2017 1131   PLT 351 04/07/2018 1639   MCV 86.8 04/07/2018 1639   MCV 92 12/14/2017 1131   MCH 29.5 04/07/2018 1639   MCHC 34.0 04/07/2018 1639   RDW 12.7 04/07/2018 1639   RDW 14.0  12/14/2017 1131   LYMPHSABS 3,269 04/07/2018 1639   LYMPHSABS 3.6 (H) 12/14/2017 1131   MONOABS 348 06/12/2016 1545   EOSABS 86 04/07/2018 1639   EOSABS 0.2 12/14/2017 1131   BASOSABS 50 04/07/2018 1639   BASOSABS 0.0 12/14/2017 1131   Iron/TIBC/Ferritin/ %Sat No results found for: IRON, TIBC, FERRITIN, IRONPCTSAT Lipid Panel     Component Value Date/Time   CHOL 212 (H) 09/13/2018 1041   TRIG 97 09/13/2018 1041   HDL 39 (L) 09/13/2018 1041   CHOLHDL 6 01/26/2018 1814   VLDL 25.6 01/26/2018 1814   LDLCALC 154 (H) 09/13/2018 1041   LDLCALC 152 (H) 11/20/2017 1647   Hepatic Function Panel     Component Value Date/Time   PROT 7.3 09/13/2018 1041   ALBUMIN 4.1 09/13/2018 1041   AST 13 09/13/2018 1041   ALT 14 09/13/2018 1041   ALKPHOS 71 09/13/2018 1041   BILITOT 0.3 09/13/2018 1041      Component Value Date/Time   TSH 1.710 12/14/2017 1131   TSH 1.420 05/25/2017 0921   TSH 1.96 08/18/2016 1624      I, Trixie Dredge, am acting as transcriptionist for Abby Potash, PA-C I, Abby Potash, PA-C have reviewed above note and agree with its content

## 2018-12-21 ENCOUNTER — Other Ambulatory Visit: Payer: Self-pay

## 2018-12-21 ENCOUNTER — Encounter (INDEPENDENT_AMBULATORY_CARE_PROVIDER_SITE_OTHER): Payer: Self-pay | Admitting: Family Medicine

## 2018-12-21 ENCOUNTER — Ambulatory Visit (INDEPENDENT_AMBULATORY_CARE_PROVIDER_SITE_OTHER): Payer: Managed Care, Other (non HMO) | Admitting: Family Medicine

## 2018-12-21 DIAGNOSIS — Z6841 Body Mass Index (BMI) 40.0 and over, adult: Secondary | ICD-10-CM | POA: Diagnosis not present

## 2018-12-21 DIAGNOSIS — E8881 Metabolic syndrome: Secondary | ICD-10-CM | POA: Diagnosis not present

## 2018-12-22 NOTE — Progress Notes (Signed)
Office: [phone]  /  Fax: [phone] TeleHealth Visit:  Autumn Curry has verbally consented to this TeleHealth visit today. The patient is located at home, the provider is located at the [facility] office. The participants in this visit include the listed provider and patient. The visit was conducted today via Webex.  HPI:   Chief Complaint: OBESITY Autumn Curry is here to discuss her progress with her obesity treatment plan. She is on the Category 2 plan and is following her eating plan approximately 80 to 85 % of the time. She states she is doing cardio 15 to 30 minutes 4 times per week. Autumn Curry struggles to get in protein at times. She sometimes gets bored with options and she had been eating off plan at lunch, such as chicken salad or boca burgers. Autumn Curry denies stress eating and struggles to stick to her plan after church on Sunday.  We were unable to weigh the patient today for this TeleHealth visit. She feels as if she has maintained weight since her last visit. She has lost 9 lbs since starting treatment with [place].  Insulin Resistance Autumn Curry has a diagnosis of insulin resistance based on her elevated fasting insulin level >5. Although Autumn Curry's blood glucose readings are still under good control, insulin resistance puts her at greater risk of metabolic syndrome and diabetes. She is not taking metformin currently and she denies polyphagia. Autumn Curry continues to work on diet and exercise to decrease risk of diabetes.  ASSESSMENT AND PLAN:  Insulin resistance  Class 3 severe obesity with serious comorbidity and body mass index (BMI) of 40.0 to 44.9 in adult, unspecified obesity type ([place])  PLAN:  Insulin Resistance Autumn Curry will continue to work on weight loss, exercise, and decreasing simple carbohydrates in her diet to help decrease the risk of diabetes. She was informed that eating too many simple carbohydrates or too many calories at one sitting increases the  likelihood of GI side effects. Autumn Curry agreed to continue with her meal plan and to follow up with [place] as directed to monitor her progress in 2 weeks.  I spent > than 50% of the 15 minute visit on counseling as documented in the note.  Obesity Autumn Curry is currently in the action stage of change. As such, her goal is to continue with weight loss efforts. She has agreed to follow the Category 2 plan and keep a food journal with 300 to 400 calories and 30+ grams of protein for lunch. Autumn Curry may use Vegetarian lunch options and journal at lunch if eating non lunch plan foods. She agrees to stick to the plan for all meals except Sunday aftre church. The Vegetarian meal plan was sent via [place]. Autumn Curry has been instructed to continue her current cardio exercise regimen. We discussed the following Behavioral Modification Strategies today: increasing lean protein intake, work on meal planning and easy cooking plans, planning for success, keep a strict food journal, and dealing with family or coworker sabotage.  Autumn Curry has agreed to follow up with our clinic in 2 weeks. She was informed of the importance of frequent follow up visits to maximize her success with intensive lifestyle modifications for her multiple health conditions.  ALLERGIES: No Known Allergies  MEDICATIONS: Current Outpatient Medications on File Prior to Visit  Medication Sig Dispense Refill  . aspirin-acetaminophen-caffeine (EXCEDRIN MIGRAINE) 250-250-65 MG tablet Take by mouth every 6 (six) hours as needed for headache.    [name] atenolol (TENORMIN) 25 MG tablet Take 1 tablet by mouth once  daily 90 tablet 1  . Darunavir-Cobicisctat-Emtricitabine-Tenofovir Alafenamide (SYMTUZA) 800-150-200-10 MG TABS Take 1 tablet by mouth daily with breakfast. 30 tablet 11  . eletriptan (RELPAX) 20 MG tablet Take 1 tablet (20 mg total) by mouth as needed for migraine or headache. One tablet by mouth at onset of headache. May repeat in 2 hours if headache  persists or recurs. 10 tablet 0  . hydrochlorothiazide (HYDRODIURIL) 25 MG tablet TAKE 1 TABLET BY MOUTH ONCE DAILY 90 tablet 1  . Ibuprofen 200 MG CAPS Take by mouth.    . loratadine (CLARITIN) 10 MG tablet Take 10 mg by mouth daily.    . Multiple Vitamins-Minerals (ALIVE WOMENS ENERGY) TABS Take 1 tablet by mouth daily.    . Omega-3-6-9 CAPS Take by mouth daily.    . valACYclovir (VALTREX) 500 MG tablet Take 1 tablet (500 mg total) by mouth 2 (two) times daily. 10 tablet 5  . Vitamin D, Ergocalciferol, (DRISDOL) 1.25 MG (50000 UT) CAPS capsule Take 1 capsule (50,000 Units total) by mouth every 7 (seven) days. 4 capsule 0   No current facility-administered medications on file prior to visit.     PAST MEDICAL HISTORY: Past Medical History:  Diagnosis Date  . Absence of menstruation   . Carbuncle and furuncle of unspecified site   . Elevated blood pressure reading without diagnosis of hypertension   . Encounter for long-term (current) use of other medications   . Headache(784.0)   . HIV infection ([place]) 2008   dx AIDS  . Hyperlipidemia   . Hypertension   . Routine general medical examination at a health care facility   . Screening for malignant neoplasm of the cervix   . Syphilis, unspecified     PAST SURGICAL HISTORY: History reviewed. No pertinent surgical history.  SOCIAL HISTORY: Social History   Tobacco Use  . Smoking status: Never Smoker  . Smokeless tobacco: Never Used  Substance Use Topics  . Alcohol use: No    Alcohol/week: 0.0 standard drinks  . Drug use: No    FAMILY HISTORY: Family History  Problem Relation Age of Onset  . Hyperlipidemia Mother   . Hypertension Mother   . Depression Mother   . Coronary artery disease Father   . Diabetes Father   . Heart disease Father   . Hyperlipidemia Father   . Stroke Father   . Kidney disease Father     ROS: Review of Systems  Endo/Heme/Allergies:       Negative for polyphagia.    PHYSICAL EXAM: Pt in no  acute distress  RECENT LABS AND TESTS: BMET    Component Value Date/Time   NA 140 09/13/2018 1041   K 3.9 09/13/2018 1041   CL 101 09/13/2018 1041   CO2 25 09/13/2018 1041   GLUCOSE 103 (H) 09/13/2018 1041   GLUCOSE 90 04/07/2018 1639   BUN 15 09/13/2018 1041   CREATININE 0.81 09/13/2018 1041   CREATININE 0.80 04/07/2018 1639   CALCIUM 9.6 09/13/2018 1041   GFRNONAA 92 09/13/2018 1041   GFRNONAA 94 04/07/2018 1639   GFRAA 107 09/13/2018 1041   GFRAA 108 04/07/2018 1639   Lab Results  Component Value Date   HGBA1C 5.6 09/13/2018   HGBA1C 5.7 (H) 04/22/2018   HGBA1C 5.7 (H) 12/14/2017   HGBA1C 5.6 05/25/2017   Lab Results  Component Value Date   INSULIN 24.8 09/13/2018   INSULIN 28.7 (H) 04/22/2018   INSULIN 31.4 (H) 12/14/2017   CBC    Component Value Date/Time  WBC 7.2 04/07/2018 1639   RBC 4.10 04/07/2018 1639   HGB 12.1 04/07/2018 1639   HGB 12.5 12/14/2017 1131   HCT 35.6 04/07/2018 1639   HCT 39.3 12/14/2017 1131   PLT 351 04/07/2018 1639   MCV 86.8 04/07/2018 1639   MCV 92 12/14/2017 1131   MCH 29.5 04/07/2018 1639   MCHC 34.0 04/07/2018 1639   RDW 12.7 04/07/2018 1639   RDW 14.0 12/14/2017 1131   LYMPHSABS 3,269 04/07/2018 1639   LYMPHSABS 3.6 (H) 12/14/2017 1131   MONOABS 348 06/12/2016 1545   EOSABS 86 04/07/2018 1639   EOSABS 0.2 12/14/2017 1131   BASOSABS 50 04/07/2018 1639   BASOSABS 0.0 12/14/2017 1131   Iron/TIBC/Ferritin/ %Sat No results found for: IRON, TIBC, FERRITIN, IRONPCTSAT Lipid Panel     Component Value Date/Time   CHOL 212 (H) 09/13/2018 1041   TRIG 97 09/13/2018 1041   HDL 39 (L) 09/13/2018 1041   CHOLHDL 6 01/26/2018 1814   VLDL 25.6 01/26/2018 1814   LDLCALC 154 (H) 09/13/2018 1041   LDLCALC 152 (H) 11/20/2017 1647   Hepatic Function Panel     Component Value Date/Time   PROT 7.3 09/13/2018 1041   ALBUMIN 4.1 09/13/2018 1041   AST 13 09/13/2018 1041   ALT 14 09/13/2018 1041   ALKPHOS 71 09/13/2018 1041    BILITOT 0.3 09/13/2018 1041      Component Value Date/Time   TSH 1.710 12/14/2017 1131   TSH 1.420 05/25/2017 0921   TSH 1.96 08/18/2016 1624   Results for AADYA, KINDLER (MRN [id number]) as of 12/22/2018 14:58  Ref. Range 09/13/2018 10:41  Vitamin D, 25-Hydroxy Latest Ref Range: 30.0 - 100.0 ng/mL 43.6     I, Marcille Blanco, CMA, am acting as Location manager for [employer], FNP-C.  I have reviewed the above documentation for accuracy and completeness, and I agree with the above.  - Kelyn Koskela, FNP-C.

## 2018-12-23 DIAGNOSIS — E88819 Insulin resistance, unspecified: Secondary | ICD-10-CM | POA: Insufficient documentation

## 2018-12-23 DIAGNOSIS — Z6841 Body Mass Index (BMI) 40.0 and over, adult: Secondary | ICD-10-CM | POA: Insufficient documentation

## 2018-12-23 DIAGNOSIS — E8881 Metabolic syndrome: Secondary | ICD-10-CM | POA: Insufficient documentation

## 2019-01-05 ENCOUNTER — Encounter (INDEPENDENT_AMBULATORY_CARE_PROVIDER_SITE_OTHER): Payer: Self-pay | Admitting: Physician Assistant

## 2019-01-06 ENCOUNTER — Ambulatory Visit (INDEPENDENT_AMBULATORY_CARE_PROVIDER_SITE_OTHER): Payer: Managed Care, Other (non HMO) | Admitting: Family Medicine

## 2019-01-20 ENCOUNTER — Encounter (INDEPENDENT_AMBULATORY_CARE_PROVIDER_SITE_OTHER): Payer: Self-pay | Admitting: Family Medicine

## 2019-01-20 ENCOUNTER — Ambulatory Visit (INDEPENDENT_AMBULATORY_CARE_PROVIDER_SITE_OTHER): Payer: Managed Care, Other (non HMO) | Admitting: Family Medicine

## 2019-01-20 ENCOUNTER — Other Ambulatory Visit: Payer: Self-pay

## 2019-01-20 VITALS — BP 114/80 | HR 79 | Temp 98.1°F | Ht 62.0 in | Wt 252.0 lb

## 2019-01-20 DIAGNOSIS — Z9189 Other specified personal risk factors, not elsewhere classified: Secondary | ICD-10-CM | POA: Diagnosis not present

## 2019-01-20 DIAGNOSIS — E559 Vitamin D deficiency, unspecified: Secondary | ICD-10-CM | POA: Diagnosis not present

## 2019-01-20 DIAGNOSIS — R7303 Prediabetes: Secondary | ICD-10-CM

## 2019-01-20 DIAGNOSIS — Z6841 Body Mass Index (BMI) 40.0 and over, adult: Secondary | ICD-10-CM

## 2019-01-20 MED ORDER — VITAMIN D (ERGOCALCIFEROL) 1.25 MG (50000 UNIT) PO CAPS: 50000.0000 [IU] | ORAL_CAPSULE | ORAL | 0 refills | Status: DC

## 2019-01-24 NOTE — Progress Notes (Signed)
Office: [phone]  /  Fax: [phone]   HPI:   Chief Complaint: OBESITY Autumn Curry is here to discuss her progress with her obesity treatment plan. She is on the keep a food journal with 300-400 calories and 30+ grams of protein at lunch daily and follow the Category 2 plan and is following her eating plan approximately 90 % of the time. She states she is exercising 0 minutes 0 times per week. Autumn Curry feels she needs to restart on the meal plan. Prior to her last virtual visit, she voices she really "fell off the wagon". She wants to refocus on the category plan to get back to losing weight.  Her weight is 252 lb (114.3 kg) today and has gained 8 lbs since her last visit. She has lost 1 lb since starting treatment with [place].  Pre-Diabetes Autumn Curry has a diagnosis of pre-diabetes based on her elevated Hgb A1c and was informed this puts her at greater risk of developing diabetes. She is not taking metformin currently and has had carbohydrate cravings, but has gotten them under control. She continues to work on diet and exercise to decrease risk of diabetes. She denies nausea or hypoglycemia.  Vitamin D Deficiency Autumn Curry has a diagnosis of vitamin D deficiency. She is currently taking prescription Vit D. Last Vit D level was of 43.6. She notes fatigue and denies nausea, vomiting or muscle weakness.  At risk for osteopenia and osteoporosis Autumn Curry is at higher risk of osteopenia and osteoporosis due to vitamin D deficiency.   ASSESSMENT AND PLAN:  Prediabetes  Vitamin D deficiency - Plan: Vitamin D, Ergocalciferol, (DRISDOL) 1.25 MG (50000 UT) CAPS capsule  At risk for osteoporosis  Class 3 severe obesity with serious comorbidity and body mass index (BMI) of 45.0 to 49.9 in adult, unspecified obesity type [facility])  PLAN:  Pre-Diabetes Autumn Curry will continue to work on weight loss, exercise, and decreasing simple carbohydrates in her diet to help decrease the risk of diabetes. We dicussed  metformin including benefits and risks. She was informed that eating too many simple carbohydrates or too many calories at one sitting increases the likelihood of GI side effects. Autumn Curry declined metformin for now and a prescription was not written today. We will repeat lab in early August. Autumn Curry agrees to follow up with our clinic in 2 weeks as directed to monitor her progress.  Vitamin D Deficiency Autumn Curry was informed that low vitamin D levels contributes to fatigue and are associated with obesity, breast, and colon cancer. Autumn Curry agrees to continue taking prescription Vit D 50,000 IU every week #4 and we will refill for 1 month. She will follow up for routine testing of vitamin D, at least 2-3 times per year. She was informed of the risk of over-replacement of vitamin D and agrees to not increase her dose unless she discusses this with [place] first. Autumn Curry agrees to follow up with our clinic in 2 weeks.  At risk for osteopenia and osteoporosis Autumn Curry was given extended (15 minutes) osteoporosis prevention counseling today. Autumn Curry is at risk for osteopenia and osteoporsis due to her vitamin D deficiency. She was encouraged to take her vitamin D and follow her higher calcium diet and increase strengthening exercise to help strengthen her bones and decrease her risk of osteopenia and osteoporosis.  Obesity Autumn Curry is currently in the action stage of change. As such, her goal is to continue with weight loss efforts She has agreed to follow the Category 2 plan Autumn Curry has been instructed to work  up to a goal of 150 minutes of combined cardio and strengthening exercise per week for weight loss and overall health benefits. We discussed the following Behavioral Modification Strategies today: increasing lean protein intake, increasing vegetables and work on meal planning and easy cooking plans, keeping healthy foods in the home, better snacking choices, and planning for success   Autumn Curry has agreed to  follow up with our clinic in 2 weeks. She was informed of the importance of frequent follow up visits to maximize her success with intensive lifestyle modifications for her multiple health conditions.  ALLERGIES: No Known Allergies  MEDICATIONS: Current Outpatient Medications on File Prior to Visit  Medication Sig Dispense Refill  . aspirin-acetaminophen-caffeine (EXCEDRIN MIGRAINE) 250-250-65 MG tablet Take by mouth every 6 (six) hours as needed for headache.    [name] atenolol (TENORMIN) 25 MG tablet Take 1 tablet by mouth once daily 90 tablet 1  . Darunavir-Cobicisctat-Emtricitabine-Tenofovir Alafenamide (SYMTUZA) 800-150-200-10 MG TABS Take 1 tablet by mouth daily with breakfast. 30 tablet 11  . eletriptan (RELPAX) 20 MG tablet Take 1 tablet (20 mg total) by mouth as needed for migraine or headache. One tablet by mouth at onset of headache. May repeat in 2 hours if headache persists or recurs. 10 tablet 0  . hydrochlorothiazide (HYDRODIURIL) 25 MG tablet TAKE 1 TABLET BY MOUTH ONCE DAILY 90 tablet 1  . Ibuprofen 200 MG CAPS Take by mouth.    . loratadine (CLARITIN) 10 MG tablet Take 10 mg by mouth daily.    . Multiple Vitamins-Minerals (ALIVE WOMENS ENERGY) TABS Take 1 tablet by mouth daily.    . Omega-3-6-9 CAPS Take by mouth daily.    . valACYclovir (VALTREX) 500 MG tablet Take 1 tablet (500 mg total) by mouth 2 (two) times daily. 10 tablet 5   No current facility-administered medications on file prior to visit.     PAST MEDICAL HISTORY: Past Medical History:  Diagnosis Date  . Absence of menstruation   . Carbuncle and furuncle of unspecified site   . Elevated blood pressure reading without diagnosis of hypertension   . Encounter for long-term (current) use of other medications   . Headache(784.0)   . HIV infection ([place]) 2008   dx AIDS  . Hyperlipidemia   . Hypertension   . Routine general medical examination at a health care facility   . Screening for malignant neoplasm of the  cervix   . Syphilis, unspecified     PAST SURGICAL HISTORY: History reviewed. No pertinent surgical history.  SOCIAL HISTORY: Social History   Tobacco Use  . Smoking status: Never Smoker  . Smokeless tobacco: Never Used  Substance Use Topics  . Alcohol use: No    Alcohol/week: 0.0 standard drinks  . Drug use: No    FAMILY HISTORY: Family History  Problem Relation Age of Onset  . Hyperlipidemia Mother   . Hypertension Mother   . Depression Mother   . Coronary artery disease Father   . Diabetes Father   . Heart disease Father   . Hyperlipidemia Father   . Stroke Father   . Kidney disease Father     ROS: Review of Systems  Constitutional: Positive for malaise/fatigue. Negative for weight loss.  Gastrointestinal: Negative for nausea and vomiting.  Musculoskeletal:       Negative muscle weakness  Endo/Heme/Allergies:       Negative hypoglycemia    PHYSICAL EXAM: Blood pressure 114/80, pulse 79, temperature 98.1 F (36.7 C), temperature source Oral, height 5\' 2"  (1.575  m), weight 252 lb (114.3 kg), SpO2 95 %. Body mass index is 46.09 kg/m. Physical Exam Vitals signs reviewed.  Constitutional:      Appearance: Normal appearance. She is obese.  Cardiovascular:     Rate and Rhythm: Normal rate.     Pulses: Normal pulses.  Pulmonary:     Effort: Pulmonary effort is normal.     Breath sounds: Normal breath sounds.  Musculoskeletal: Normal range of motion.  Skin:    General: Skin is warm and dry.  Neurological:     Mental Status: She is alert and oriented to person, place, and time.  Psychiatric:        Mood and Affect: Mood normal.        Behavior: Behavior normal.     RECENT LABS AND TESTS: BMET    Component Value Date/Time   NA 140 09/13/2018 1041   K 3.9 09/13/2018 1041   CL 101 09/13/2018 1041   CO2 25 09/13/2018 1041   GLUCOSE 103 (H) 09/13/2018 1041   GLUCOSE 90 04/07/2018 1639   BUN 15 09/13/2018 1041   CREATININE 0.81 09/13/2018 1041    CREATININE 0.80 04/07/2018 1639   CALCIUM 9.6 09/13/2018 1041   GFRNONAA 92 09/13/2018 1041   GFRNONAA 94 04/07/2018 1639   GFRAA 107 09/13/2018 1041   GFRAA 108 04/07/2018 1639   Lab Results  Component Value Date   HGBA1C 5.6 09/13/2018   HGBA1C 5.7 (H) 04/22/2018   HGBA1C 5.7 (H) 12/14/2017   HGBA1C 5.6 05/25/2017   Lab Results  Component Value Date   INSULIN 24.8 09/13/2018   INSULIN 28.7 (H) 04/22/2018   INSULIN 31.4 (H) 12/14/2017   CBC    Component Value Date/Time   WBC 7.2 04/07/2018 1639   RBC 4.10 04/07/2018 1639   HGB 12.1 04/07/2018 1639   HGB 12.5 12/14/2017 1131   HCT 35.6 04/07/2018 1639   HCT 39.3 12/14/2017 1131   PLT 351 04/07/2018 1639   MCV 86.8 04/07/2018 1639   MCV 92 12/14/2017 1131   MCH 29.5 04/07/2018 1639   MCHC 34.0 04/07/2018 1639   RDW 12.7 04/07/2018 1639   RDW 14.0 12/14/2017 1131   LYMPHSABS 3,269 04/07/2018 1639   LYMPHSABS 3.6 (H) 12/14/2017 1131   MONOABS 348 06/12/2016 1545   EOSABS 86 04/07/2018 1639   EOSABS 0.2 12/14/2017 1131   BASOSABS 50 04/07/2018 1639   BASOSABS 0.0 12/14/2017 1131   Iron/TIBC/Ferritin/ %Sat No results found for: IRON, TIBC, FERRITIN, IRONPCTSAT Lipid Panel     Component Value Date/Time   CHOL 212 (H) 09/13/2018 1041   TRIG 97 09/13/2018 1041   HDL 39 (L) 09/13/2018 1041   CHOLHDL 6 01/26/2018 1814   VLDL 25.6 01/26/2018 1814   LDLCALC 154 (H) 09/13/2018 1041   LDLCALC 152 (H) 11/20/2017 1647   Hepatic Function Panel     Component Value Date/Time   PROT 7.3 09/13/2018 1041   ALBUMIN 4.1 09/13/2018 1041   AST 13 09/13/2018 1041   ALT 14 09/13/2018 1041   ALKPHOS 71 09/13/2018 1041   BILITOT 0.3 09/13/2018 1041      Component Value Date/Time   TSH 1.710 12/14/2017 1131   TSH 1.420 05/25/2017 0921   TSH 1.96 08/18/2016 1624      OBESITY BEHAVIORAL INTERVENTION VISIT  Today's visit was # 23   Starting weight: 253 lbs Starting date: 12/14/17 Today's weight : 252 lbs Today's date:  01/20/2019 Total lbs lost to date: 1    ASK:  We discussed the diagnosis of obesity with Autumn Curry today and Autumn Curry agreed to give [place] permission to discuss obesity behavioral modification therapy today.  ASSESS: Autumn Curry has the diagnosis of obesity and her BMI today is 46.08 Autumn Curry is in the action stage of change   ADVISE: Autumn Curry was educated on the multiple health risks of obesity as well as the benefit of weight loss to improve her health. She was advised of the need for long term treatment and the importance of lifestyle modifications to improve her current health and to decrease her risk of future health problems.  AGREE: Multiple dietary modification options and treatment options were discussed and  Autumn Curry agreed to follow the recommendations documented in the above note.  ARRANGE: Autumn Curry was educated on the importance of frequent visits to treat obesity as outlined per CMS and USPSTF guidelines and agreed to schedule her next follow up appointment today.  I, Trixie Dredge, am acting as transcriptionist for Ilene Qua, MD  I have reviewed the above documentation for accuracy and completeness, and I agree with the above. - Ilene Qua, MD

## 2019-02-02 ENCOUNTER — Other Ambulatory Visit: Payer: Self-pay | Admitting: Family Medicine

## 2019-02-02 DIAGNOSIS — I10 Essential (primary) hypertension: Secondary | ICD-10-CM

## 2019-02-03 ENCOUNTER — Ambulatory Visit (INDEPENDENT_AMBULATORY_CARE_PROVIDER_SITE_OTHER): Payer: Managed Care, Other (non HMO) | Admitting: Family Medicine

## 2019-02-03 ENCOUNTER — Encounter (INDEPENDENT_AMBULATORY_CARE_PROVIDER_SITE_OTHER): Payer: Self-pay | Admitting: Family Medicine

## 2019-02-03 ENCOUNTER — Other Ambulatory Visit: Payer: Self-pay

## 2019-02-03 VITALS — BP 110/72 | HR 72 | Temp 98.0°F | Ht 62.0 in | Wt 255.0 lb

## 2019-02-03 DIAGNOSIS — R7303 Prediabetes: Secondary | ICD-10-CM

## 2019-02-03 DIAGNOSIS — E7849 Other hyperlipidemia: Secondary | ICD-10-CM

## 2019-02-03 DIAGNOSIS — Z6841 Body Mass Index (BMI) 40.0 and over, adult: Secondary | ICD-10-CM | POA: Diagnosis not present

## 2019-02-07 NOTE — Progress Notes (Signed)
Office: [phone]  /  Fax: [phone]   HPI:   Chief Complaint: OBESITY Autumn Curry is here to discuss her progress with her obesity treatment plan. She is on the Category 2 plan and is following her eating plan approximately 85 % of the time. She states she is exercising 0 minutes 0 times per week. Autumn Curry reports she feels very bloated today, she is wondering if weight gain is related to that. She has no plans for the next 2 weeks, or no plans for trips.  Her weight is 255 lb (115.7 kg) today and has gained 3 lbs since her last visit. She has lost 0 lbs since starting treatment with [place].  Pre-Diabetes Autumn Curry has a diagnosis of pre-diabetes based on her elevated Hgb A1c and was informed this puts her at greater risk of developing diabetes. She notes minimal carbohydrate cravings and she is not on metformin. She continues to work on diet and exercise to decrease risk of diabetes. She denies nausea or hypoglycemia.  Hyperlipidemia Autumn Curry has hyperlipidemia and has been trying to improve her cholesterol levels with intensive lifestyle modification including a low saturated fat diet, exercise and weight loss. Last LDL was of 154 and HDL of 39. She is not on statin and denies any chest pain, claudication or myalgias.  ASSESSMENT AND PLAN:  Prediabetes  Other hyperlipidemia  Class 3 severe obesity with serious comorbidity and body mass index (BMI) of 45.0 to 49.9 in adult, unspecified obesity type [facility])  PLAN:  Pre-Diabetes Autumn Curry will continue to work on weight loss, exercise, and decreasing simple carbohydrates in her diet to help decrease the risk of diabetes. We dicussed metformin including benefits and risks. She was informed that eating too many simple carbohydrates or too many calories at one sitting increases the likelihood of GI side effects. [place] declined metformin for now and a prescription was not written today. We will plan to retest labs at her next appointment. Autumn Curry  agrees to follow up with our clinic in 2 weeks as directed to monitor her progress.  Hyperlipidemia Autumn Curry was informed of the American Heart Association Guidelines emphasizing intensive lifestyle modifications as the first line treatment for hyperlipidemia. We discussed many lifestyle modifications today in depth, and Autumn Curry will continue to work on decreasing saturated fats such as fatty red meat, butter and many fried foods. She will also increase vegetables and lean protein in her diet and continue to work on exercise and weight loss efforts. We will repeat labs at her next appointment. Autumn Curry agrees to follow up with our clinic in 2 weeks.  Obesity Autumn Curry is currently in the action stage of change. As such, her goal is to continue with weight loss efforts She has agreed to follow the Category 2 plan Autumn Curry has been instructed to work up to a goal of 150 minutes of combined cardio and strengthening exercise per week or physical activity for 15-30 minutes 2-3 times per week for weight loss and overall health benefits. We discussed the following Behavioral Modification Strategies today: increasing lean protein intake, increasing vegetables and work on meal planning and easy cooking plans, keeping healthy foods in the home, and planning for success   Autumn Curry has agreed to follow up with our clinic in 2 weeks. She was informed of the importance of frequent follow up visits to maximize her success with intensive lifestyle modifications for her multiple health conditions.  ALLERGIES: No Known Allergies  MEDICATIONS: Current Outpatient Medications on File Prior to Visit  Medication Sig Dispense  Refill  . aspirin-acetaminophen-caffeine (EXCEDRIN MIGRAINE) 250-250-65 MG tablet Take by mouth every 6 (six) hours as needed for headache.    [name] atenolol (TENORMIN) 25 MG tablet Take 1 tablet by mouth once daily 90 tablet 1  . Darunavir-Cobicisctat-Emtricitabine-Tenofovir Alafenamide (SYMTUZA)  800-150-200-10 MG TABS Take 1 tablet by mouth daily with breakfast. 30 tablet 11  . eletriptan (RELPAX) 20 MG tablet Take 1 tablet (20 mg total) by mouth as needed for migraine or headache. One tablet by mouth at onset of headache. May repeat in 2 hours if headache persists or recurs. 10 tablet 0  . hydrochlorothiazide (HYDRODIURIL) 25 MG tablet TAKE 1 TABLET BY MOUTH ONCE DAILY **  MUST  HAVE  OFFICE  VISIT  BEFORE  FURTHER  REFILLS** 90 tablet 0  . Ibuprofen 200 MG CAPS Take by mouth.    . loratadine (CLARITIN) 10 MG tablet Take 10 mg by mouth daily.    . Multiple Vitamins-Minerals (ALIVE WOMENS ENERGY) TABS Take 1 tablet by mouth daily.    . Omega-3-6-9 CAPS Take by mouth daily.    . valACYclovir (VALTREX) 500 MG tablet Take 1 tablet (500 mg total) by mouth 2 (two) times daily. 10 tablet 5  . Vitamin D, Ergocalciferol, (DRISDOL) 1.25 MG (50000 UT) CAPS capsule Take 1 capsule (50,000 Units total) by mouth every 7 (seven) days. 4 capsule 0   No current facility-administered medications on file prior to visit.     PAST MEDICAL HISTORY: Past Medical History:  Diagnosis Date  . Absence of menstruation   . Carbuncle and furuncle of unspecified site   . Elevated blood pressure reading without diagnosis of hypertension   . Encounter for long-term (current) use of other medications   . Headache(784.0)   . HIV infection ([place]) 2008   dx AIDS  . Hyperlipidemia   . Hypertension   . Routine general medical examination at a health care facility   . Screening for malignant neoplasm of the cervix   . Syphilis, unspecified     PAST SURGICAL HISTORY: History reviewed. No pertinent surgical history.  SOCIAL HISTORY: Social History   Tobacco Use  . Smoking status: Never Smoker  . Smokeless tobacco: Never Used  Substance Use Topics  . Alcohol use: No    Alcohol/week: 0.0 standard drinks  . Drug use: No    FAMILY HISTORY: Family History  Problem Relation Age of Onset  . Hyperlipidemia  Mother   . Hypertension Mother   . Depression Mother   . Coronary artery disease Father   . Diabetes Father   . Heart disease Father   . Hyperlipidemia Father   . Stroke Father   . Kidney disease Father     ROS: Review of Systems  Constitutional: Negative for weight loss.  Cardiovascular: Negative for chest pain and claudication.  Gastrointestinal: Negative for nausea.  Musculoskeletal: Negative for myalgias.  Endo/Heme/Allergies:       Negative hypoglycemia    PHYSICAL EXAM: Blood pressure 110/72, pulse 72, temperature 98 F (36.7 C), temperature source Oral, height 5\' 2"  (1.575 m), weight 255 lb (115.7 kg), last menstrual period 12/29/2018, SpO2 97 %. Body mass index is 46.64 kg/m. Physical Exam Vitals signs reviewed.  Constitutional:      Appearance: Normal appearance. She is obese.  Cardiovascular:     Rate and Rhythm: Normal rate.     Pulses: Normal pulses.  Pulmonary:     Effort: Pulmonary effort is normal.     Breath sounds: Normal breath sounds.  Musculoskeletal: Normal range of motion.  Skin:    General: Skin is warm and dry.  Neurological:     Mental Status: She is alert and oriented to person, place, and time.  Psychiatric:        Mood and Affect: Mood normal.        Behavior: Behavior normal.     RECENT LABS AND TESTS: BMET    Component Value Date/Time   NA 140 09/13/2018 1041   K 3.9 09/13/2018 1041   CL 101 09/13/2018 1041   CO2 25 09/13/2018 1041   GLUCOSE 103 (H) 09/13/2018 1041   GLUCOSE 90 04/07/2018 1639   BUN 15 09/13/2018 1041   CREATININE 0.81 09/13/2018 1041   CREATININE 0.80 04/07/2018 1639   CALCIUM 9.6 09/13/2018 1041   GFRNONAA 92 09/13/2018 1041   GFRNONAA 94 04/07/2018 1639   GFRAA 107 09/13/2018 1041   GFRAA 108 04/07/2018 1639   Lab Results  Component Value Date   HGBA1C 5.6 09/13/2018   HGBA1C 5.7 (H) 04/22/2018   HGBA1C 5.7 (H) 12/14/2017   HGBA1C 5.6 05/25/2017   Lab Results  Component Value Date   INSULIN  24.8 09/13/2018   INSULIN 28.7 (H) 04/22/2018   INSULIN 31.4 (H) 12/14/2017   CBC    Component Value Date/Time   WBC 7.2 04/07/2018 1639   RBC 4.10 04/07/2018 1639   HGB 12.1 04/07/2018 1639   HGB 12.5 12/14/2017 1131   HCT 35.6 04/07/2018 1639   HCT 39.3 12/14/2017 1131   PLT 351 04/07/2018 1639   MCV 86.8 04/07/2018 1639   MCV 92 12/14/2017 1131   MCH 29.5 04/07/2018 1639   MCHC 34.0 04/07/2018 1639   RDW 12.7 04/07/2018 1639   RDW 14.0 12/14/2017 1131   LYMPHSABS 3,269 04/07/2018 1639   LYMPHSABS 3.6 (H) 12/14/2017 1131   MONOABS 348 06/12/2016 1545   EOSABS 86 04/07/2018 1639   EOSABS 0.2 12/14/2017 1131   BASOSABS 50 04/07/2018 1639   BASOSABS 0.0 12/14/2017 1131   Iron/TIBC/Ferritin/ %Sat No results found for: IRON, TIBC, FERRITIN, IRONPCTSAT Lipid Panel     Component Value Date/Time   CHOL 212 (H) 09/13/2018 1041   TRIG 97 09/13/2018 1041   HDL 39 (L) 09/13/2018 1041   CHOLHDL 6 01/26/2018 1814   VLDL 25.6 01/26/2018 1814   LDLCALC 154 (H) 09/13/2018 1041   LDLCALC 152 (H) 11/20/2017 1647   Hepatic Function Panel     Component Value Date/Time   PROT 7.3 09/13/2018 1041   ALBUMIN 4.1 09/13/2018 1041   AST 13 09/13/2018 1041   ALT 14 09/13/2018 1041   ALKPHOS 71 09/13/2018 1041   BILITOT 0.3 09/13/2018 1041      Component Value Date/Time   TSH 1.710 12/14/2017 1131   TSH 1.420 05/25/2017 0921   TSH 1.96 08/18/2016 1624      OBESITY BEHAVIORAL INTERVENTION VISIT  Today's visit was # 24   Starting weight: 253 lbs Starting date: 12/14/17 Today's weight : 255 lbs  Today's date: 02/03/2019 Total lbs lost to date: 0    ASK: We discussed the diagnosis of obesity with Autumn Curry today and Autumn Curry agreed to give [place] permission to discuss obesity behavioral modification therapy today.  ASSESS: Autumn Curry has the diagnosis of obesity and her BMI today is 46.63 Autumn Curry is in the action stage of change   ADVISE: Autumn Curry was educated on the  multiple health risks of obesity as well as the benefit of weight loss to improve her  health. She was advised of the need for long term treatment and the importance of lifestyle modifications to improve her current health and to decrease her risk of future health problems.  AGREE: Multiple dietary modification options and treatment options were discussed and  Autumn Curry agreed to follow the recommendations documented in the above note.  ARRANGE: Autumn Curry was educated on the importance of frequent visits to treat obesity as outlined per CMS and USPSTF guidelines and agreed to schedule her next follow up appointment today.  I, Trixie Dredge, am acting as transcriptionist for Ilene Qua, MD  I have reviewed the above documentation for accuracy and completeness, and I agree with the above. - Ilene Qua, MD

## 2019-02-22 ENCOUNTER — Encounter (INDEPENDENT_AMBULATORY_CARE_PROVIDER_SITE_OTHER): Payer: Self-pay | Admitting: Family Medicine

## 2019-02-22 ENCOUNTER — Other Ambulatory Visit: Payer: Self-pay

## 2019-02-22 ENCOUNTER — Ambulatory Visit (INDEPENDENT_AMBULATORY_CARE_PROVIDER_SITE_OTHER): Payer: Managed Care, Other (non HMO) | Admitting: Family Medicine

## 2019-02-22 VITALS — BP 116/77 | HR 70 | Temp 98.3°F | Ht 62.0 in | Wt 254.0 lb

## 2019-02-22 DIAGNOSIS — I10 Essential (primary) hypertension: Secondary | ICD-10-CM | POA: Diagnosis not present

## 2019-02-22 DIAGNOSIS — E7849 Other hyperlipidemia: Secondary | ICD-10-CM | POA: Diagnosis not present

## 2019-02-22 DIAGNOSIS — E559 Vitamin D deficiency, unspecified: Secondary | ICD-10-CM | POA: Diagnosis not present

## 2019-02-22 DIAGNOSIS — Z6841 Body Mass Index (BMI) 40.0 and over, adult: Secondary | ICD-10-CM

## 2019-02-22 DIAGNOSIS — R7303 Prediabetes: Secondary | ICD-10-CM

## 2019-02-22 DIAGNOSIS — Z9189 Other specified personal risk factors, not elsewhere classified: Secondary | ICD-10-CM

## 2019-02-23 LAB — COMPREHENSIVE METABOLIC PANEL
ALT: 12 IU/L (ref 0–32)
AST: 13 IU/L (ref 0–40)
Albumin/Globulin Ratio: 1.2 (ref 1.2–2.2)
Albumin: 4 g/dL (ref 3.8–4.8)
Alkaline Phosphatase: 73 IU/L (ref 39–117)
BUN/Creatinine Ratio: 22 (ref 9–23)
BUN: 19 mg/dL (ref 6–20)
Bilirubin Total: 0.2 mg/dL (ref 0.0–1.2)
CO2: 24 mmol/L (ref 20–29)
Calcium: 9.5 mg/dL (ref 8.7–10.2)
Chloride: 99 mmol/L (ref 96–106)
Creatinine, Ser: 0.85 mg/dL (ref 0.57–1.00)
GFR calc Af Amer: 101 mL/min/{1.73_m2} (ref >59)
GFR calc non Af Amer: 87 mL/min/{1.73_m2} (ref >59)
Globulin, Total: 3.4 g/dL (ref 1.5–4.5)
Glucose: 106 mg/dL — ABNORMAL HIGH (ref 65–99)
Potassium: 3.9 mmol/L (ref 3.5–5.2)
Sodium: 138 mmol/L (ref 134–144)
Total Protein: 7.4 g/dL (ref 6.0–8.5)

## 2019-02-23 LAB — LIPID PANEL WITH LDL/HDL RATIO
Cholesterol, Total: 214 mg/dL — ABNORMAL HIGH (ref 100–199)
HDL: 41 mg/dL
LDL Calculated: 145 mg/dL — ABNORMAL HIGH (ref 0–99)
LDl/HDL Ratio: 3.5 ratio — ABNORMAL HIGH (ref 0.0–3.2)
Triglycerides: 138 mg/dL (ref 0–149)
VLDL Cholesterol Cal: 28 mg/dL (ref 5–40)

## 2019-02-23 LAB — HEMOGLOBIN A1C
Est. average glucose Bld gHb Est-mCnc: 117 mg/dL
Hgb A1c MFr Bld: 5.7 % — ABNORMAL HIGH (ref 4.8–5.6)

## 2019-02-23 LAB — VITAMIN D 25 HYDROXY (VIT D DEFICIENCY, FRACTURES): Vit D, 25-Hydroxy: 38.6 ng/mL (ref 30.0–100.0)

## 2019-02-23 LAB — INSULIN, RANDOM: INSULIN: 60.9 u[IU]/mL — ABNORMAL HIGH (ref 2.6–24.9)

## 2019-02-23 NOTE — Progress Notes (Signed)
Office: [phone]  /  Fax: [phone]   HPI:   Chief Complaint: OBESITY Autumn Curry is here to discuss her progress with her obesity treatment plan. She is on the Category 2 plan and is following her eating plan approximately 75 to 80 % of the time. She states she is doing cardio exercise and walking 30 minutes 4 times per week. The last few weeks, patient is still feeling bloated from her last appointment. She is walking four times a week. She voices she is a habitual eater, so she doesn't mind eating the same foods over and over. Her weight is 254 lb (115.2 kg) today and has had a weight loss of 1 pound over a period of 2 weeks since her last visit. She has gained 1 lb since starting treatment with [place].  Hyperlipidemia Autumn Curry has hyperlipidemia and she has LDL of 154 and HDL of 39. She is not on statin. [name] has been trying to improve her cholesterol levels with intensive lifestyle modification including a low saturated fat diet, exercise and weight loss. She denies any chest pain, claudication or myalgias.  Pre-Diabetes Autumn Curry has a diagnosis of prediabetes based on her elevated Hgb A1c and was informed this puts her at greater risk of developing diabetes. Her last A1c was at 5.6. [name] is not taking metformin currently and she continues to work on diet and exercise to decrease risk of diabetes. She denies nausea or hypoglycemia.  At risk for diabetes Autumn Curry is at higher than average risk for developing diabetes due to her obesity and prediabetes. She currently denies polyuria or polydipsia.  Vitamin D deficiency Autumn Curry has a diagnosis of vitamin D deficiency. She is currently taking vit D. Autumn Curry admits fatigue and denies nausea, vomiting or muscle weakness.  Hypertension Autumn Curry is a 38 y.o. female with hypertension. Autumn Curry denies chest pain, chest pressure or headache. She is working weight loss to help control her blood pressure with the goal of  decreasing her risk of heart attack and stroke. Autumn Curry blood pressure is currently controlled today.  ASSESSMENT AND PLAN:  Other hyperlipidemia - Plan: Lipid Panel With LDL/HDL Ratio  Prediabetes - Plan: Comprehensive metabolic panel, Hemoglobin A1c, Insulin, random  Vitamin D deficiency - Plan: VITAMIN D 25 Hydroxy (Vit-D Deficiency, Fractures)  Essential hypertension  At risk for diabetes mellitus  Class 3 severe obesity with serious comorbidity and body mass index (BMI) of 45.0 to 49.9 in adult, unspecified obesity type ([place])  PLAN:  Hyperlipidemia Autumn Curry was informed of the American Heart Association Guidelines emphasizing intensive lifestyle modifications as the first line treatment for hyperlipidemia. We discussed many lifestyle modifications today in depth, and Autumn Curry will continue to work on decreasing saturated fats such as fatty red meat, butter and many fried foods. She will also increase vegetables and lean protein in her diet and continue to work on exercise and weight loss efforts. We will check fasting lipid panel and Krupa will follow up as directed.  Pre-Diabetes Autumn Curry will continue to work on weight loss, exercise, and decreasing simple carbohydrates in her diet to help decrease the risk of diabetes. She was informed that eating too many simple carbohydrates or too many calories at one sitting increases the likelihood of GI side effects. We will check Hgb A1c and insulin level and Wylma agreed to follow up with [place] as directed to monitor her progress.  Diabetes risk counseling Autumn Curry was given extended (15 minutes) diabetes prevention counseling today. She is 38 y.o. female  and has risk factors for diabetes including obesity and prediabetes. We discussed intensive lifestyle modifications today with an emphasis on weight loss as well as increasing exercise and decreasing simple carbohydrates in her diet.  Vitamin D Deficiency Autumn Curry was informed that low  vitamin D levels contributes to fatigue and are associated with obesity, breast, and colon cancer. Autumn Curry will continue to take prescription Vit D @50 ,000 IU every week and she will follow up for routine testing of vitamin D, at least 2-3 times per year. She was informed of the risk of over-replacement of vitamin D and agrees to not increase her dose unless she discusses this with [place] first. We will check vitamin D level today and Autumn Curry agrees to follow up as directed.  Hypertension We discussed sodium restriction, working on healthy weight loss, and a regular exercise program as the means to achieve improved blood pressure control. Autumn Curry agreed with this plan and agreed to follow up as directed. We will continue to monitor her blood pressure as well as her progress with the above lifestyle modifications. She will continue her medications as prescribed and will watch for signs of hypotension as she continues her lifestyle modifications. We will check CMP today.  Obesity Autumn Curry is currently in the action stage of change. As such, her goal is to continue with weight loss efforts She has agreed to follow the Category 2 plan or the Pescatarian eating plan Autumn Curry has been instructed to work up to a goal of 150 minutes of combined cardio and strengthening exercise per week for weight loss and overall health benefits. We discussed the following Behavioral Modification Strategies today: planning for success, keeping healthy foods in the home, better snacking choices, increasing lean protein intake, increasing vegetables and work on meal planning and easy cooking plans  Autumn Curry has agreed to follow up with our clinic in 2 weeks. She was informed of the importance of frequent follow up visits to maximize her success with intensive lifestyle modifications for her multiple health conditions.  ALLERGIES: No Known Allergies  MEDICATIONS: Current Outpatient Medications on File Prior to Visit  Medication  Sig Dispense Refill   aspirin-acetaminophen-caffeine (EXCEDRIN MIGRAINE) 250-250-65 MG tablet Take by mouth every 6 (six) hours as needed for headache.     atenolol (TENORMIN) 25 MG tablet Take 1 tablet by mouth once daily 90 tablet 1   Darunavir-Cobicisctat-Emtricitabine-Tenofovir Alafenamide (SYMTUZA) 800-150-200-10 MG TABS Take 1 tablet by mouth daily with breakfast. 30 tablet 11   eletriptan (RELPAX) 20 MG tablet Take 1 tablet (20 mg total) by mouth as needed for migraine or headache. One tablet by mouth at onset of headache. May repeat in 2 hours if headache persists or recurs. 10 tablet 0   hydrochlorothiazide (HYDRODIURIL) 25 MG tablet TAKE 1 TABLET BY MOUTH ONCE DAILY **  MUST  HAVE  OFFICE  VISIT  BEFORE  FURTHER  REFILLS** 90 tablet 0   Ibuprofen 200 MG CAPS Take by mouth.     loratadine (CLARITIN) 10 MG tablet Take 10 mg by mouth daily.     Multiple Vitamins-Minerals (ALIVE WOMENS ENERGY) TABS Take 1 tablet by mouth daily.     Omega-3-6-9 CAPS Take by mouth daily.     valACYclovir (VALTREX) 500 MG tablet Take 1 tablet (500 mg total) by mouth 2 (two) times daily. 10 tablet 5   Vitamin D, Ergocalciferol, (DRISDOL) 1.25 MG (50000 UT) CAPS capsule Take 1 capsule (50,000 Units total) by mouth every 7 (seven) days. 4 capsule 0   No  current facility-administered medications on file prior to visit.     PAST MEDICAL HISTORY: Past Medical History:  Diagnosis Date   Absence of menstruation    Carbuncle and furuncle of unspecified site    Elevated blood pressure reading without diagnosis of hypertension    Encounter for long-term (current) use of other medications    Headache(784.0)    HIV infection ([place]) 2008   dx AIDS   Hyperlipidemia    Hypertension    Routine general medical examination at a health care facility    Screening for malignant neoplasm of the cervix    Syphilis, unspecified     PAST SURGICAL HISTORY: History reviewed. No pertinent surgical  history.  SOCIAL HISTORY: Social History   Tobacco Use   Smoking status: Never Smoker   Smokeless tobacco: Never Used  Substance Use Topics   Alcohol use: No    Alcohol/week: 0.0 standard drinks   Drug use: No    FAMILY HISTORY: Family History  Problem Relation Age of Onset   Hyperlipidemia Mother    Hypertension Mother    Depression Mother    Coronary artery disease Father    Diabetes Father    Heart disease Father    Hyperlipidemia Father    Stroke Father    Kidney disease Father     ROS: Review of Systems  Constitutional: Positive for malaise/fatigue and weight loss.  Cardiovascular: Negative for chest pain.       Negative for chest pressure  Gastrointestinal: Negative for nausea and vomiting.  Genitourinary: Negative for frequency.  Musculoskeletal:       Negative for muscle weakness  Neurological: Negative for headaches.  Endo/Heme/Allergies: Negative for polydipsia.       Negative for hypoglycemia    PHYSICAL EXAM: Blood pressure 116/77, pulse 70, temperature 98.3 F (36.8 C), temperature source Oral, height 5\' 2"  (1.575 m), weight 254 lb (115.2 kg), last menstrual period 02/15/2019, SpO2 96 %. Body mass index is 46.46 kg/m. Physical Exam Vitals signs reviewed.  Constitutional:      Appearance: Normal appearance. She is well-developed. She is obese.  Cardiovascular:     Rate and Rhythm: Normal rate.  Pulmonary:     Effort: Pulmonary effort is normal.  Musculoskeletal: Normal range of motion.  Skin:    General: Skin is warm and dry.  Neurological:     Mental Status: She is alert and oriented to person, place, and time.  Psychiatric:        Mood and Affect: Mood normal.        Behavior: Behavior normal.     RECENT LABS AND TESTS: BMET    Component Value Date/Time   NA 138 02/22/2019 0853   K 3.9 02/22/2019 0853   CL 99 02/22/2019 0853   CO2 24 02/22/2019 0853   GLUCOSE 106 (H) 02/22/2019 0853   GLUCOSE 90 04/07/2018 1639    BUN 19 02/22/2019 0853   CREATININE 0.85 02/22/2019 0853   CREATININE 0.80 04/07/2018 1639   CALCIUM 9.5 02/22/2019 0853   GFRNONAA 87 02/22/2019 0853   GFRNONAA 94 04/07/2018 1639   GFRAA 101 02/22/2019 0853   GFRAA 108 04/07/2018 1639   Lab Results  Component Value Date   HGBA1C 5.7 (H) 02/22/2019   HGBA1C 5.6 09/13/2018   HGBA1C 5.7 (H) 04/22/2018   HGBA1C 5.7 (H) 12/14/2017   HGBA1C 5.6 05/25/2017   Lab Results  Component Value Date   INSULIN WILL FOLLOW 02/22/2019   INSULIN 24.8 09/13/2018   INSULIN  28.7 (H) 04/22/2018   INSULIN 31.4 (H) 12/14/2017   CBC    Component Value Date/Time   WBC 7.2 04/07/2018 1639   RBC 4.10 04/07/2018 1639   HGB 12.1 04/07/2018 1639   HGB 12.5 12/14/2017 1131   HCT 35.6 04/07/2018 1639   HCT 39.3 12/14/2017 1131   PLT 351 04/07/2018 1639   MCV 86.8 04/07/2018 1639   MCV 92 12/14/2017 1131   MCH 29.5 04/07/2018 1639   MCHC 34.0 04/07/2018 1639   RDW 12.7 04/07/2018 1639   RDW 14.0 12/14/2017 1131   LYMPHSABS 3,269 04/07/2018 1639   LYMPHSABS 3.6 (H) 12/14/2017 1131   MONOABS 348 06/12/2016 1545   EOSABS 86 04/07/2018 1639   EOSABS 0.2 12/14/2017 1131   BASOSABS 50 04/07/2018 1639   BASOSABS 0.0 12/14/2017 1131   Iron/TIBC/Ferritin/ %Sat No results found for: IRON, TIBC, FERRITIN, IRONPCTSAT Lipid Panel     Component Value Date/Time   CHOL 214 (H) 02/22/2019 0853   TRIG 138 02/22/2019 0853   HDL 41 02/22/2019 0853   CHOLHDL 6 01/26/2018 1814   VLDL 25.6 01/26/2018 1814   LDLCALC 145 (H) 02/22/2019 0853   LDLCALC 152 (H) 11/20/2017 1647   Hepatic Function Panel     Component Value Date/Time   PROT 7.4 02/22/2019 0853   ALBUMIN 4.0 02/22/2019 0853   AST 13 02/22/2019 0853   ALT 12 02/22/2019 0853   ALKPHOS 73 02/22/2019 0853   BILITOT 0.2 02/22/2019 0853      Component Value Date/Time   TSH 1.710 12/14/2017 1131   TSH 1.420 05/25/2017 0921   TSH 1.96 08/18/2016 1624     Ref. Range 09/13/2018 10:41  Vitamin D,  25-Hydroxy Latest Ref Range: 30.0 - 100.0 ng/mL 43.6    OBESITY BEHAVIORAL INTERVENTION VISIT  Today's visit was # 25  Starting weight: 253 lbs Starting date: 12/14/2017 Today's weight : 254 lbs Today's date: 02/22/2019 Total lbs lost to date: 0    02/22/2019  Height 5\' 2"  (1.575 m)  Weight 254 lb (115.2 kg)  BMI (Calculated) 46.45  BLOOD PRESSURE - SYSTOLIC 740  BLOOD PRESSURE - DIASTOLIC 77   Body Fat % 81.4 %  Total Body Water (lbs) 91.2 lbs    ASK: We discussed the diagnosis of obesity with Geryl Councilman today and Devaney agreed to give [place] permission to discuss obesity behavioral modification therapy today.  ASSESS: Aedyn has the diagnosis of obesity and her BMI today is 46.45 Nixie is in the action stage of change   ADVISE: Rheanna was educated on the multiple health risks of obesity as well as the benefit of weight loss to improve her health. She was advised of the need for long term treatment and the importance of lifestyle modifications to improve her current health and to decrease her risk of future health problems.  AGREE: Multiple dietary modification options and treatment options were discussed and  Cataleyah agreed to follow the recommendations documented in the above note.  ARRANGE: Evan was educated on the importance of frequent visits to treat obesity as outlined per CMS and USPSTF guidelines and agreed to schedule her next follow up appointment today.  I, Doreene Nest, am acting as transcriptionist for Eber Jones, MD  I have reviewed the above documentation for accuracy and completeness, and I agree with the above. - Ilene Qua, MD

## 2019-03-08 ENCOUNTER — Ambulatory Visit (INDEPENDENT_AMBULATORY_CARE_PROVIDER_SITE_OTHER): Payer: Managed Care, Other (non HMO) | Admitting: Family Medicine

## 2019-03-08 ENCOUNTER — Other Ambulatory Visit: Payer: Self-pay

## 2019-03-08 VITALS — BP 108/75 | HR 82 | Temp 98.1°F | Ht 62.0 in | Wt 253.0 lb

## 2019-03-08 DIAGNOSIS — Z9189 Other specified personal risk factors, not elsewhere classified: Secondary | ICD-10-CM | POA: Diagnosis not present

## 2019-03-08 DIAGNOSIS — R7303 Prediabetes: Secondary | ICD-10-CM

## 2019-03-08 DIAGNOSIS — Z6841 Body Mass Index (BMI) 40.0 and over, adult: Secondary | ICD-10-CM

## 2019-03-08 DIAGNOSIS — E559 Vitamin D deficiency, unspecified: Secondary | ICD-10-CM

## 2019-03-08 MED ORDER — METFORMIN HCL 500 MG PO TABS: 500.0000 mg | ORAL_TABLET | Freq: Every day | ORAL | 0 refills | Status: DC

## 2019-03-08 MED ORDER — VITAMIN D (ERGOCALCIFEROL) 1.25 MG (50000 UNIT) PO CAPS: 50000.0000 [IU] | ORAL_CAPSULE | ORAL | 0 refills | Status: DC

## 2019-03-09 NOTE — Progress Notes (Signed)
Office: [phone]  /  Fax: [phone]   HPI:   Chief Complaint: OBESITY Autumn Curry is here to discuss her progress with her obesity treatment plan. She is on the Category 2 plan and is following her eating plan approximately 90% of the time. She states she is walking or doing cardio 15-30 minutes 2-3 times per week. Autumn Curry reports the past few weeks have been pretty good stating she has done more of Category 2 than Pescatarian. She denies hunger and denies cravings. She started measuring food and noticed she wasn't getting in enough. Her weight is 253 lb (114.8 kg) today and has had a weight loss of 1 pound over a period of 2 weeks since her last visit. She has lost 0 lbs since starting treatment with [place].  Pre-Diabetes Autumn Curry has a diagnosis of prediabetes based on her elevated Hgb A1c and was informed this puts her at greater risk of developing diabetes. Insulin was elevated at 60.9 on 02/22/2019 and A1c was 5.7. She is not taking metformin currently and continues to work on diet and exercise to decrease risk of diabetes. She reports having a sensitive stomach.  Vitamin D deficiency Autumn Curry has a diagnosis of Vitamin D deficiency. She is currently taking prescription Vit D and denies nausea, vomiting or muscle weakness but does admit to fatigue.  At risk for osteopenia and osteoporosis Autumn Curry is at higher risk of osteopenia and osteoporosis due to Vitamin D deficiency.   ASSESSMENT AND PLAN:  Prediabetes - Plan: metFORMIN (GLUCOPHAGE) 500 MG tablet  Vitamin D deficiency - Plan: Vitamin D, Ergocalciferol, (DRISDOL) 1.25 MG (50000 UT) CAPS capsule  At risk for osteoporosis  Class 3 severe obesity with serious comorbidity and body mass index (BMI) of 45.0 to 49.9 in adult, unspecified obesity type [facility])  PLAN:  Pre-Diabetes Autumn Curry will continue to work on weight loss, exercise, and decreasing simple carbohydrates in her diet to help decrease the risk of diabetes. We dicussed  metformin including benefits and risks. She was informed that eating too many simple carbohydrates or too many calories at one sitting increases the likelihood of GI side effects. [name] will start metformin 500 mg PO QAM #30 with 0 refills and agrees to follow-up with our clinic in 2 weeks.  Vitamin D Deficiency Autumn Curry was informed that low Vitamin D levels contributes to fatigue and are associated with obesity, breast, and colon cancer. She agrees to continue to take prescription Vit D @ 50,000 IU every week #4 with 0 refills and will follow-up for routine testing of Vitamin D, at least 2-3 times per year. She was informed of the risk of over-replacement of Vitamin D and agrees to not increase her dose unless she discusses this with [place] first. Autumn Curry agrees to follow-up with our clinic in 2 weeks.  At risk for osteopenia and osteoporosis Autumn Curry was given extended  (15 minutes) osteoporosis prevention counseling today. Autumn Curry is at risk for osteopenia and osteoporosis due to her Vitamin D deficiency. She was encouraged to take her Vitamin D and follow her higher calcium diet and increase strengthening exercise to help strengthen her bones and decrease her risk of osteopenia and osteoporosis.  Obesity Autumn Curry is currently in the action stage of change. As such, her goal is to continue with weight loss efforts. She has agreed to follow the Category 2 plan. Autumn Curry agrees to add resistance training 10-15 minutes 2-3 times per week for weight loss and overall health benefits. We discussed the following Behavioral Modification Strategies today: increasing  lean protein intake, increasing vegetables, work on meal planning and easy cooking plans, keeping healthy foods in the home, and planning for success.  Autumn Curry has agreed to follow-up with our clinic in 2 weeks. She was informed of the importance of frequent follow-up visits to maximize her success with intensive lifestyle modifications for her  multiple health conditions.  ALLERGIES: No Known Allergies  MEDICATIONS: Current Outpatient Medications on File Prior to Visit  Medication Sig Dispense Refill  . aspirin-acetaminophen-caffeine (EXCEDRIN MIGRAINE) 250-250-65 MG tablet Take by mouth every 6 (six) hours as needed for headache.    [name] atenolol (TENORMIN) 25 MG tablet Take 1 tablet by mouth once daily 90 tablet 1  . Darunavir-Cobicisctat-Emtricitabine-Tenofovir Alafenamide (SYMTUZA) 800-150-200-10 MG TABS Take 1 tablet by mouth daily with breakfast. 30 tablet 11  . eletriptan (RELPAX) 20 MG tablet Take 1 tablet (20 mg total) by mouth as needed for migraine or headache. One tablet by mouth at onset of headache. May repeat in 2 hours if headache persists or recurs. 10 tablet 0  . hydrochlorothiazide (HYDRODIURIL) 25 MG tablet TAKE 1 TABLET BY MOUTH ONCE DAILY **  MUST  HAVE  OFFICE  VISIT  BEFORE  FURTHER  REFILLS** 90 tablet 0  . Ibuprofen 200 MG CAPS Take by mouth.    . loratadine (CLARITIN) 10 MG tablet Take 10 mg by mouth daily.    . Multiple Vitamins-Minerals (ALIVE WOMENS ENERGY) TABS Take 1 tablet by mouth daily.    . Omega-3-6-9 CAPS Take by mouth daily.    . valACYclovir (VALTREX) 500 MG tablet Take 1 tablet (500 mg total) by mouth 2 (two) times daily. 10 tablet 5   No current facility-administered medications on file prior to visit.     PAST MEDICAL HISTORY: Past Medical History:  Diagnosis Date  . Absence of menstruation   . Carbuncle and furuncle of unspecified site   . Elevated blood pressure reading without diagnosis of hypertension   . Encounter for long-term (current) use of other medications   . Headache(784.0)   . HIV infection ([place]) 2008   dx AIDS  . Hyperlipidemia   . Hypertension   . Routine general medical examination at a health care facility   . Screening for malignant neoplasm of the cervix   . Syphilis, unspecified     PAST SURGICAL HISTORY: No past surgical history on file.  SOCIAL HISTORY:  Social History   Tobacco Use  . Smoking status: Never Smoker  . Smokeless tobacco: Never Used  Substance Use Topics  . Alcohol use: No    Alcohol/week: 0.0 standard drinks  . Drug use: No    FAMILY HISTORY: Family History  Problem Relation Age of Onset  . Hyperlipidemia Mother   . Hypertension Mother   . Depression Mother   . Coronary artery disease Father   . Diabetes Father   . Heart disease Father   . Hyperlipidemia Father   . Stroke Father   . Kidney disease Father    ROS: Review of Systems  Constitutional: Positive for malaise/fatigue.  Gastrointestinal: Negative for nausea and vomiting.  Musculoskeletal:       Negative for muscle weakness.   PHYSICAL EXAM: Blood pressure 108/75, pulse 82, temperature 98.1 F (36.7 C), temperature source Oral, height 5\' 2"  (1.575 m), weight 253 lb (114.8 kg), last menstrual period 02/15/2019. Body mass index is 46.27 kg/m. Physical Exam Vitals signs reviewed.  Constitutional:      Appearance: Normal appearance. She is obese.  Cardiovascular:  Rate and Rhythm: Normal rate.     Pulses: Normal pulses.  Pulmonary:     Effort: Pulmonary effort is normal.     Breath sounds: Normal breath sounds.  Musculoskeletal: Normal range of motion.  Skin:    General: Skin is warm and dry.  Neurological:     Mental Status: She is alert and oriented to person, place, and time.  Psychiatric:        Behavior: Behavior normal.   RECENT LABS AND TESTS: BMET    Component Value Date/Time   NA 138 02/22/2019 0853   K 3.9 02/22/2019 0853   CL 99 02/22/2019 0853   CO2 24 02/22/2019 0853   GLUCOSE 106 (H) 02/22/2019 0853   GLUCOSE 90 04/07/2018 1639   BUN 19 02/22/2019 0853   CREATININE 0.85 02/22/2019 0853   CREATININE 0.80 04/07/2018 1639   CALCIUM 9.5 02/22/2019 0853   GFRNONAA 87 02/22/2019 0853   GFRNONAA 94 04/07/2018 1639   GFRAA 101 02/22/2019 0853   GFRAA 108 04/07/2018 1639   Lab Results  Component Value Date   HGBA1C  5.7 (H) 02/22/2019   HGBA1C 5.6 09/13/2018   HGBA1C 5.7 (H) 04/22/2018   HGBA1C 5.7 (H) 12/14/2017   HGBA1C 5.6 05/25/2017   Lab Results  Component Value Date   INSULIN 60.9 (H) 02/22/2019   INSULIN 24.8 09/13/2018   INSULIN 28.7 (H) 04/22/2018   INSULIN 31.4 (H) 12/14/2017   CBC    Component Value Date/Time   WBC 7.2 04/07/2018 1639   RBC 4.10 04/07/2018 1639   HGB 12.1 04/07/2018 1639   HGB 12.5 12/14/2017 1131   HCT 35.6 04/07/2018 1639   HCT 39.3 12/14/2017 1131   PLT 351 04/07/2018 1639   MCV 86.8 04/07/2018 1639   MCV 92 12/14/2017 1131   MCH 29.5 04/07/2018 1639   MCHC 34.0 04/07/2018 1639   RDW 12.7 04/07/2018 1639   RDW 14.0 12/14/2017 1131   LYMPHSABS 3,269 04/07/2018 1639   LYMPHSABS 3.6 (H) 12/14/2017 1131   MONOABS 348 06/12/2016 1545   EOSABS 86 04/07/2018 1639   EOSABS 0.2 12/14/2017 1131   BASOSABS 50 04/07/2018 1639   BASOSABS 0.0 12/14/2017 1131   Iron/TIBC/Ferritin/ %Sat No results found for: IRON, TIBC, FERRITIN, IRONPCTSAT Lipid Panel     Component Value Date/Time   CHOL 214 (H) 02/22/2019 0853   TRIG 138 02/22/2019 0853   HDL 41 02/22/2019 0853   CHOLHDL 6 01/26/2018 1814   VLDL 25.6 01/26/2018 1814   LDLCALC 145 (H) 02/22/2019 0853   LDLCALC 152 (H) 11/20/2017 1647   Hepatic Function Panel     Component Value Date/Time   PROT 7.4 02/22/2019 0853   ALBUMIN 4.0 02/22/2019 0853   AST 13 02/22/2019 0853   ALT 12 02/22/2019 0853   ALKPHOS 73 02/22/2019 0853   BILITOT 0.2 02/22/2019 0853      Component Value Date/Time   TSH 1.710 12/14/2017 1131   TSH 1.420 05/25/2017 0921   TSH 1.96 08/18/2016 1624   Results for ALETA, SCHUCHARD (MRN [id number]) as of 03/09/2019 09:35  Ref. Range 02/22/2019 08:53  Vitamin D, 25-Hydroxy Latest Ref Range: 30.0 - 100.0 ng/mL 38.6   OBESITY BEHAVIORAL INTERVENTION VISIT  Today's visit was #26   Starting weight: 253 lbs Starting date: 12/14/2017 Today's weight: 253 lbs Today's date: 03/08/2019  Total lbs lost to date: 0    03/08/2019  Height 5\' 2"  (1.575 m)  Weight 253 lb (114.8 kg)  BMI (Calculated) 46.26  BLOOD PRESSURE - SYSTOLIC [id number]  BLOOD PRESSURE - DIASTOLIC 75   Body Fat % [id number] %  Total Body Water (lbs) 90.4 lbs   ASK: We discussed the diagnosis of obesity with Autumn Curry today and Autumn Curry agreed to give [place] permission to discuss obesity behavioral modification therapy today.  ASSESS: Autumn Curry has the diagnosis of obesity and her BMI today is 46.4. Autumn Curry is in the action stage of change.   ADVISE: Autumn Curry was educated on the multiple health risks of obesity as well as the benefit of weight loss to improve her health. She was advised of the need for long term treatment and the importance of lifestyle modifications to improve her current health and to decrease her risk of future health problems.  AGREE: Multiple dietary modification options and treatment options were discussed and  Autumn Curry agreed to follow the recommendations documented in the above note.  ARRANGE: Autumn Curry was educated on the importance of frequent visits to treat obesity as outlined per CMS and USPSTF guidelines and agreed to schedule her next follow up appointment today.  I, Michaelene Song, am acting as transcriptionist for Ilene Qua, MD   I have reviewed the above documentation for accuracy and completeness, and I agree with the above. - Ilene Qua, MD

## 2019-03-22 ENCOUNTER — Ambulatory Visit (INDEPENDENT_AMBULATORY_CARE_PROVIDER_SITE_OTHER): Payer: Managed Care, Other (non HMO) | Admitting: Family Medicine

## 2019-03-22 ENCOUNTER — Other Ambulatory Visit: Payer: Self-pay | Admitting: Internal Medicine

## 2019-03-22 DIAGNOSIS — B20 Human immunodeficiency virus [HIV] disease: Secondary | ICD-10-CM

## 2019-03-23 ENCOUNTER — Ambulatory Visit (INDEPENDENT_AMBULATORY_CARE_PROVIDER_SITE_OTHER): Payer: Managed Care, Other (non HMO) | Admitting: Physician Assistant

## 2019-03-23 ENCOUNTER — Other Ambulatory Visit: Payer: Self-pay

## 2019-03-23 ENCOUNTER — Encounter (INDEPENDENT_AMBULATORY_CARE_PROVIDER_SITE_OTHER): Payer: Self-pay | Admitting: Physician Assistant

## 2019-03-23 VITALS — BP 120/84 | HR 86 | Temp 98.3°F | Ht 62.0 in | Wt 252.0 lb

## 2019-03-23 DIAGNOSIS — E559 Vitamin D deficiency, unspecified: Secondary | ICD-10-CM | POA: Diagnosis not present

## 2019-03-23 DIAGNOSIS — Z6841 Body Mass Index (BMI) 40.0 and over, adult: Secondary | ICD-10-CM

## 2019-03-23 NOTE — Progress Notes (Signed)
Office: [phone]  /  Fax: [phone]   HPI:   Chief Complaint: OBESITY Autumn Curry is here to discuss her progress with her obesity treatment plan. She is on the Category 2 plan and is following her eating plan approximately 90% of the time. She states she is doing cardio/resistance 20-30 minutes 3 times per week. Autumn Curry is working from home currently and has found it challenging. She is doing some stress eating and boredom eating.  Her weight is 252 lb (114.3 kg) today and has had a weight loss of 1 pound over a period of 2 weeks since her last visit. She has lost 1 lb since starting treatment with [place].  Vitamin D deficiency Autumn Curry has a diagnosis of Vitamin D deficiency. She is currently taking Vit D and denies nausea, vomiting or muscle weakness.  ASSESSMENT AND PLAN:  Vitamin D deficiency  Class 3 severe obesity with serious comorbidity and body mass index (BMI) of 45.0 to 49.9 in adult, unspecified obesity type (Autumn Curry)  PLAN:  Vitamin D Deficiency Autumn Curry was informed that low Vitamin D levels contributes to fatigue and are associated with obesity, breast, and colon cancer. She agrees to continue taking Vit D and will follow-up for routine testing of Vitamin D, at least 2-3 times per year. She was informed of the risk of over-replacement of Vitamin D and agrees to not increase her dose unless she discusses this with [place] first. Autumn Curry agrees to follow-up with our clinic in 2 weeks.  I spent > than 50% of the 15 minute visit on counseling as documented in the note.  Obesity Autumn Curry is currently in the action stage of change. As such, her goal is to continue with weight loss efforts. She has agreed to change her plan and begin keeping a food journal with 1200 calories and 85 grams of protein daily. Autumn Curry will have IC checked at her next visit. Autumn Curry has been instructed to work up to a goal of 150 minutes of combined cardio and strengthening exercise per week for weight loss  and overall health benefits. We discussed the following Behavioral Modification Strategies today: no skipping meals, work on meal planning and easy cooking plans.  Autumn Curry has agreed to follow-up with our clinic in 2 weeks. She was informed of the importance of frequent follow-up visits to maximize her success with intensive lifestyle modifications for her multiple health conditions.  ALLERGIES: No Known Allergies  MEDICATIONS: Current Outpatient Medications on File Prior to Visit  Medication Sig Dispense Refill   aspirin-acetaminophen-caffeine (EXCEDRIN MIGRAINE) 250-250-65 MG tablet Take by mouth every 6 (six) hours as needed for headache.     atenolol (TENORMIN) 25 MG tablet Take 1 tablet by mouth once daily 90 tablet 1   eletriptan (RELPAX) 20 MG tablet Take 1 tablet (20 mg total) by mouth as needed for migraine or headache. One tablet by mouth at onset of headache. May repeat in 2 hours if headache persists or recurs. 10 tablet 0   hydrochlorothiazide (HYDRODIURIL) 25 MG tablet TAKE 1 TABLET BY MOUTH ONCE DAILY **  MUST  HAVE  OFFICE  VISIT  BEFORE  FURTHER  REFILLS** 90 tablet 0   Ibuprofen 200 MG CAPS Take by mouth.     loratadine (CLARITIN) 10 MG tablet Take 10 mg by mouth daily.     metFORMIN (GLUCOPHAGE) 500 MG tablet Take 1 tablet (500 mg total) by mouth daily with breakfast. 30 tablet 0   Multiple Vitamins-Minerals (ALIVE WOMENS ENERGY) TABS Take 1 tablet by  mouth daily.     Omega-3-6-9 CAPS Take by mouth daily.     SYMTUZA 800-150-200-10 MG TABS TAKE 1 TABLET BY MOUTH DAILY WITH BREAKFAST. 30 tablet 0   valACYclovir (VALTREX) 500 MG tablet Take 1 tablet (500 mg total) by mouth 2 (two) times daily. 10 tablet 5   Vitamin D, Ergocalciferol, (DRISDOL) 1.25 MG (50000 UT) CAPS capsule Take 1 capsule (50,000 Units total) by mouth every 7 (seven) days. 4 capsule 0   No current facility-administered medications on file prior to visit.     PAST MEDICAL HISTORY: Past Medical  History:  Diagnosis Date   Absence of menstruation    Carbuncle and furuncle of unspecified site    Elevated blood pressure reading without diagnosis of hypertension    Encounter for long-term (current) use of other medications    Headache(784.0)    HIV infection ([place]) 2008   dx AIDS   Hyperlipidemia    Hypertension    Routine general medical examination at a health care facility    Screening for malignant neoplasm of the cervix    Syphilis, unspecified     PAST SURGICAL HISTORY: History reviewed. No pertinent surgical history.  SOCIAL HISTORY: Social History   Tobacco Use   Smoking status: Never Smoker   Smokeless tobacco: Never Used  Substance Use Topics   Alcohol use: No    Alcohol/week: 0.0 standard drinks   Drug use: No    FAMILY HISTORY: Family History  Problem Relation Age of Onset   Hyperlipidemia Mother    Hypertension Mother    Depression Mother    Coronary artery disease Father    Diabetes Father    Heart disease Father    Hyperlipidemia Father    Stroke Father    Kidney disease Father    ROS: Review of Systems  Gastrointestinal: Negative for nausea and vomiting.  Musculoskeletal:       Negative for muscle weakness.   PHYSICAL EXAM: Blood pressure 120/84, pulse 86, temperature 98.3 F (36.8 C), temperature source Oral, height 5\' 2"  (1.575 m), weight 252 lb (114.3 kg), SpO2 97 %. Body mass index is 46.09 kg/m. Physical Exam Vitals signs reviewed.  Constitutional:      Appearance: Normal appearance. She is obese.  Cardiovascular:     Rate and Rhythm: Normal rate.     Pulses: Normal pulses.  Pulmonary:     Effort: Pulmonary effort is normal.     Breath sounds: Normal breath sounds.  Musculoskeletal: Normal range of motion.  Skin:    General: Skin is warm and dry.  Neurological:     Mental Status: She is alert and oriented to person, place, and time.  Psychiatric:        Behavior: Behavior normal.   RECENT LABS  AND TESTS: BMET    Component Value Date/Time   NA 138 02/22/2019 0853   K 3.9 02/22/2019 0853   CL 99 02/22/2019 0853   CO2 24 02/22/2019 0853   GLUCOSE 106 (H) 02/22/2019 0853   GLUCOSE 90 04/07/2018 1639   BUN 19 02/22/2019 0853   CREATININE 0.85 02/22/2019 0853   CREATININE 0.80 04/07/2018 1639   CALCIUM 9.5 02/22/2019 0853   GFRNONAA 87 02/22/2019 0853   GFRNONAA 94 04/07/2018 1639   GFRAA 101 02/22/2019 0853   GFRAA 108 04/07/2018 1639   Lab Results  Component Value Date   HGBA1C 5.7 (H) 02/22/2019   HGBA1C 5.6 09/13/2018   HGBA1C 5.7 (H) 04/22/2018   HGBA1C 5.7 (H)  12/14/2017   HGBA1C 5.6 05/25/2017   Lab Results  Component Value Date   INSULIN 60.9 (H) 02/22/2019   INSULIN 24.8 09/13/2018   INSULIN 28.7 (H) 04/22/2018   INSULIN 31.4 (H) 12/14/2017   CBC    Component Value Date/Time   WBC 7.2 04/07/2018 1639   RBC 4.10 04/07/2018 1639   HGB 12.1 04/07/2018 1639   HGB 12.5 12/14/2017 1131   HCT 35.6 04/07/2018 1639   HCT 39.3 12/14/2017 1131   PLT 351 04/07/2018 1639   MCV 86.8 04/07/2018 1639   MCV 92 12/14/2017 1131   MCH 29.5 04/07/2018 1639   MCHC 34.0 04/07/2018 1639   RDW 12.7 04/07/2018 1639   RDW 14.0 12/14/2017 1131   LYMPHSABS 3,269 04/07/2018 1639   LYMPHSABS 3.6 (H) 12/14/2017 1131   MONOABS 348 06/12/2016 1545   EOSABS 86 04/07/2018 1639   EOSABS 0.2 12/14/2017 1131   BASOSABS 50 04/07/2018 1639   BASOSABS 0.0 12/14/2017 1131   Iron/TIBC/Ferritin/ %Sat No results found for: IRON, TIBC, FERRITIN, IRONPCTSAT Lipid Panel     Component Value Date/Time   CHOL 214 (H) 02/22/2019 0853   TRIG 138 02/22/2019 0853   HDL 41 02/22/2019 0853   CHOLHDL 6 01/26/2018 1814   VLDL 25.6 01/26/2018 1814   LDLCALC 145 (H) 02/22/2019 0853   LDLCALC 152 (H) 11/20/2017 1647   Hepatic Function Panel     Component Value Date/Time   PROT 7.4 02/22/2019 0853   ALBUMIN 4.0 02/22/2019 0853   AST 13 02/22/2019 0853   ALT 12 02/22/2019 0853   ALKPHOS 73  02/22/2019 0853   BILITOT 0.2 02/22/2019 0853      Component Value Date/Time   TSH 1.710 12/14/2017 1131   TSH 1.420 05/25/2017 0921   TSH 1.96 08/18/2016 1624   Results for FINLEIGH, VANCLEAVE (MRN [id number]) as of 03/23/2019 11:21  Ref. Range 02/22/2019 08:53  Vitamin D, 25-Hydroxy Latest Ref Range: 30.0 - 100.0 ng/mL 38.6   OBESITY BEHAVIORAL INTERVENTION VISIT  Today's visit was #27   Starting weight: 253 lbs Starting date: 12/14/2017 Today's weight: 252 lbs  Today's date: 03/23/2019 Total lbs lost to date: 1    03/23/2019  Height 5\' 2"  (1.575 m)  Weight 252 lb (114.3 kg)  BMI (Calculated) 46.08  BLOOD PRESSURE - SYSTOLIC [id number]  BLOOD PRESSURE - DIASTOLIC 84   Body Fat % [id number] %  Total Body Water (lbs) 88.4 lbs   ASK: We discussed the diagnosis of obesity with Autumn Curry today and Autumn Curry agreed to give [place] permission to discuss obesity behavioral modification therapy today.  ASSESS: Autumn Curry has the diagnosis of obesity and her BMI today is 46.1. Autumn Curry is in the action stage of change.   ADVISE: Autumn Curry was educated on the multiple health risks of obesity as well as the benefit of weight loss to improve her health. She was advised of the need for long term treatment and the importance of lifestyle modifications to improve her current health and to decrease her risk of future health problems.  AGREE: Multiple dietary modification options and treatment options were discussed and  Autumn Curry agreed to follow the recommendations documented in the above note.  ARRANGE: Autumn Curry was educated on the importance of frequent visits to treat obesity as outlined per CMS and USPSTF guidelines and agreed to schedule her next follow up appointment today.  Autumn Curry, am acting as transcriptionist for Abby Potash, PA-C I, Abby Potash, PA-C have reviewed above note and agree  with its content

## 2019-04-04 ENCOUNTER — Other Ambulatory Visit: Payer: Self-pay | Admitting: Internal Medicine

## 2019-04-04 DIAGNOSIS — B20 Human immunodeficiency virus [HIV] disease: Secondary | ICD-10-CM

## 2019-04-12 ENCOUNTER — Ambulatory Visit (INDEPENDENT_AMBULATORY_CARE_PROVIDER_SITE_OTHER): Payer: Managed Care, Other (non HMO) | Admitting: Physician Assistant

## 2019-04-12 ENCOUNTER — Encounter: Payer: Self-pay | Admitting: Physician Assistant

## 2019-04-12 ENCOUNTER — Other Ambulatory Visit: Payer: Self-pay

## 2019-04-12 VITALS — BP 103/72 | HR 74 | Temp 98.8°F | Ht 62.0 in | Wt 252.0 lb

## 2019-04-12 DIAGNOSIS — Z6841 Body Mass Index (BMI) 40.0 and over, adult: Secondary | ICD-10-CM

## 2019-04-12 DIAGNOSIS — R7303 Prediabetes: Secondary | ICD-10-CM | POA: Diagnosis not present

## 2019-04-12 DIAGNOSIS — Z9189 Other specified personal risk factors, not elsewhere classified: Secondary | ICD-10-CM

## 2019-04-12 DIAGNOSIS — R0602 Shortness of breath: Secondary | ICD-10-CM

## 2019-04-12 DIAGNOSIS — E559 Vitamin D deficiency, unspecified: Secondary | ICD-10-CM

## 2019-04-12 MED ORDER — VITAMIN D (ERGOCALCIFEROL) 1.25 MG (50000 UNIT) PO CAPS: 50000.0000 [IU] | ORAL_CAPSULE | ORAL | 0 refills | Status: DC

## 2019-04-12 MED ORDER — METFORMIN HCL 500 MG PO TABS: 500.0000 mg | ORAL_TABLET | Freq: Every day | ORAL | 0 refills | Status: DC

## 2019-04-12 NOTE — Progress Notes (Signed)
Office: [phone]  /  Fax: [phone]   HPI:   Chief Complaint: OBESITY Edithe is here to discuss her progress with her obesity treatment plan. She is on the Category 2 plan and is following her eating plan approximately 80% of the time. She states she is doing cardio/walking 30 minutes 2-3 times per week. Latika reports that she had a birthday and overindulged. She has not been journaling because she said her app wasn't working.  Her weight is 252 lb (114.3 kg) today and has not lost weight since her last visit. She has lost 1 lb since starting treatment with [place].  Vitamin D deficiency Shanaiya has a diagnosis of Vitamin D deficiency. She is currently taking prescription Vit D and denies nausea, vomiting or muscle weakness.  At risk for osteopenia and osteoporosis Destenie is at higher risk of osteopenia and osteoporosis due to Vitamin D deficiency.   Pre-Diabetes Jasel has a diagnosis of prediabetes based on her elevated Hgb A1c and was informed this puts her at greater risk of developing diabetes. She is taking metformin currently and continues to work on diet and exercise to decrease risk of diabetes. She denies nausea, vomiting, or diarrhea. No polyphagia.  Shortness of Breath Suzanna notes increasing shortness of breath with exertion. She denies dizziness.   ASSESSMENT AND PLAN:  Vitamin D deficiency - Plan: Vitamin D, Ergocalciferol, (DRISDOL) 1.25 MG (50000 UT) CAPS capsule  Prediabetes - Plan: metFORMIN (GLUCOPHAGE) 500 MG tablet  Shortness of breath on exertion  At risk for osteoporosis  Class 3 severe obesity with serious comorbidity and body mass index (BMI) of 45.0 to 49.9 in adult, unspecified obesity type ([place])  PLAN:  Vitamin D Deficiency Yareli was informed that low Vitamin D levels contributes to fatigue and are associated with obesity, breast, and colon cancer. She agrees to continue to take prescription Vit D @ 50,000 IU every week #4 with 0 refills  and will follow-up for routine testing of Vitamin D, at least 2-3 times per year. She was informed of the risk of over-replacement of Vitamin D and agrees to not increase her dose unless she discusses this with [place] first. Shauna agrees to follow-up with our clinic in 2 weeks.  At risk for osteopenia and osteoporosis Jayani was given extended  (15 minutes) osteoporosis prevention counseling today. Leondria is at risk for osteopenia and osteoporosis due to her Vitamin D deficiency. She was encouraged to take her Vitamin D and follow her higher calcium diet and increase strengthening exercise to help strengthen her bones and decrease her risk of osteopenia and osteoporosis.  Pre-Diabetes Sonya will continue to work on weight loss, exercise, and decreasing simple carbohydrates in her diet to help decrease the risk of diabetes. We dicussed metformin including benefits and risks. She was informed that eating too many simple carbohydrates or too many calories at one sitting increases the likelihood of GI side effects. Wandy was given a refill on her metformin #30 with 0 refills and agrees to follow-up with our clinic in 2 weeks.  Shortness of Breath Znya's shortness of breath appears to be obesity related and exercise induced. The indirect calorimeter results showed VO2 of 180 and a REE of 1254. She has agreed to work on weight loss and gradually increase exercise to treat her exercise induced shortness of breath. If Jahayra follows our instructions and loses weight without improvement of her shortness of breath, we will plan to refer to pulmonology. Arthenia agrees to this plan.  Obesity Wells Guiles  is currently in the action stage of change. As such, her goal is to continue with weight loss efforts. She has agreed to keep a food journal with 1200 calories and 85 grams of protein daily. Rhiyan has been instructed to work up to a goal of 150 minutes of combined cardio and strengthening exercise per week  for weight loss and overall health benefits. We discussed the following Behavioral Modification Strategies today: work on meal planning and easy cooking plans, and keeping healthy foods in the home.  Evadean has agreed to follow-up with our clinic in 2 weeks. She was informed of the importance of frequent follow-up visits to maximize her success with intensive lifestyle modifications for her multiple health conditions.  ALLERGIES: No Known Allergies  MEDICATIONS: Current Outpatient Medications on File Prior to Visit  Medication Sig Dispense Refill   aspirin-acetaminophen-caffeine (EXCEDRIN MIGRAINE) 250-250-65 MG tablet Take by mouth every 6 (six) hours as needed for headache.     atenolol (TENORMIN) 25 MG tablet Take 1 tablet by mouth once daily 90 tablet 1   eletriptan (RELPAX) 20 MG tablet Take 1 tablet (20 mg total) by mouth as needed for migraine or headache. One tablet by mouth at onset of headache. May repeat in 2 hours if headache persists or recurs. 10 tablet 0   hydrochlorothiazide (HYDRODIURIL) 25 MG tablet TAKE 1 TABLET BY MOUTH ONCE DAILY **  MUST  HAVE  OFFICE  VISIT  BEFORE  FURTHER  REFILLS** 90 tablet 0   Ibuprofen 200 MG CAPS Take by mouth.     loratadine (CLARITIN) 10 MG tablet Take 10 mg by mouth daily.     Multiple Vitamins-Minerals (ALIVE WOMENS ENERGY) TABS Take 1 tablet by mouth daily.     Omega-3-6-9 CAPS Take by mouth daily.     SYMTUZA 800-150-200-10 MG TABS TAKE 1 TABLET BY MOUTH DAILY WITH BREAKFAST. 30 tablet 0   valACYclovir (VALTREX) 500 MG tablet Take 1 tablet (500 mg total) by mouth 2 (two) times daily. 10 tablet 5   No current facility-administered medications on file prior to visit.     PAST MEDICAL HISTORY: Past Medical History:  Diagnosis Date   Absence of menstruation    Carbuncle and furuncle of unspecified site    Elevated blood pressure reading without diagnosis of hypertension    Encounter for long-term (current) use of other  medications    Headache(784.0)    HIV infection ([place]) 2008   dx AIDS   Hyperlipidemia    Hypertension    Routine general medical examination at a health care facility    Screening for malignant neoplasm of the cervix    Syphilis, unspecified     PAST SURGICAL HISTORY: No past surgical history on file.  SOCIAL HISTORY: Social History   Tobacco Use   Smoking status: Never Smoker   Smokeless tobacco: Never Used  Substance Use Topics   Alcohol use: No    Alcohol/week: 0.0 standard drinks   Drug use: No    FAMILY HISTORY: Family History  Problem Relation Age of Onset   Hyperlipidemia Mother    Hypertension Mother    Depression Mother    Coronary artery disease Father    Diabetes Father    Heart disease Father    Hyperlipidemia Father    Stroke Father    Kidney disease Father    ROS: Review of Systems  Respiratory: Positive for shortness of breath.   Gastrointestinal: Negative for diarrhea, nausea and vomiting.  Musculoskeletal:  Negative for muscle weakness.  Endo/Heme/Allergies:       Negative for polyphagia.   PHYSICAL EXAM: Blood pressure 103/72, pulse 74, temperature 98.8 F (37.1 C), temperature source Oral, height 5\' 2"  (1.575 m), weight 252 lb (114.3 kg), last menstrual period 04/03/2019, SpO2 98 %. Body mass index is 46.09 kg/m. Physical Exam Vitals signs reviewed.  Constitutional:      Appearance: Normal appearance. She is obese.  Cardiovascular:     Rate and Rhythm: Normal rate.     Pulses: Normal pulses.  Pulmonary:     Effort: Pulmonary effort is normal.     Breath sounds: Normal breath sounds.  Musculoskeletal: Normal range of motion.  Skin:    General: Skin is warm and dry.  Neurological:     Mental Status: She is alert and oriented to person, place, and time.  Psychiatric:        Behavior: Behavior normal.   RECENT LABS AND TESTS: BMET    Component Value Date/Time   NA 138 02/22/2019 0853   K 3.9 02/22/2019  0853   CL 99 02/22/2019 0853   CO2 24 02/22/2019 0853   GLUCOSE 106 (H) 02/22/2019 0853   GLUCOSE 90 04/07/2018 1639   BUN 19 02/22/2019 0853   CREATININE 0.85 02/22/2019 0853   CREATININE 0.80 04/07/2018 1639   CALCIUM 9.5 02/22/2019 0853   GFRNONAA 87 02/22/2019 0853   GFRNONAA 94 04/07/2018 1639   GFRAA 101 02/22/2019 0853   GFRAA 108 04/07/2018 1639   Lab Results  Component Value Date   HGBA1C 5.7 (H) 02/22/2019   HGBA1C 5.6 09/13/2018   HGBA1C 5.7 (H) 04/22/2018   HGBA1C 5.7 (H) 12/14/2017   HGBA1C 5.6 05/25/2017   Lab Results  Component Value Date   INSULIN 60.9 (H) 02/22/2019   INSULIN 24.8 09/13/2018   INSULIN 28.7 (H) 04/22/2018   INSULIN 31.4 (H) 12/14/2017   CBC    Component Value Date/Time   WBC 7.2 04/07/2018 1639   RBC 4.10 04/07/2018 1639   HGB 12.1 04/07/2018 1639   HGB 12.5 12/14/2017 1131   HCT 35.6 04/07/2018 1639   HCT 39.3 12/14/2017 1131   PLT 351 04/07/2018 1639   MCV 86.8 04/07/2018 1639   MCV 92 12/14/2017 1131   MCH 29.5 04/07/2018 1639   MCHC 34.0 04/07/2018 1639   RDW 12.7 04/07/2018 1639   RDW 14.0 12/14/2017 1131   LYMPHSABS 3,269 04/07/2018 1639   LYMPHSABS 3.6 (H) 12/14/2017 1131   MONOABS 348 06/12/2016 1545   EOSABS 86 04/07/2018 1639   EOSABS 0.2 12/14/2017 1131   BASOSABS 50 04/07/2018 1639   BASOSABS 0.0 12/14/2017 1131   Iron/TIBC/Ferritin/ %Sat No results found for: IRON, TIBC, FERRITIN, IRONPCTSAT Lipid Panel     Component Value Date/Time   CHOL 214 (H) 02/22/2019 0853   TRIG 138 02/22/2019 0853   HDL 41 02/22/2019 0853   CHOLHDL 6 01/26/2018 1814   VLDL 25.6 01/26/2018 1814   LDLCALC 145 (H) 02/22/2019 0853   LDLCALC 152 (H) 11/20/2017 1647   Hepatic Function Panel     Component Value Date/Time   PROT 7.4 02/22/2019 0853   ALBUMIN 4.0 02/22/2019 0853   AST 13 02/22/2019 0853   ALT 12 02/22/2019 0853   ALKPHOS 73 02/22/2019 0853   BILITOT 0.2 02/22/2019 0853      Component Value Date/Time   TSH 1.710  12/14/2017 1131   TSH 1.420 05/25/2017 0921   TSH 1.96 08/18/2016 1624   Results for Roop,  ULRICA KOBBE (MRN [id number]) as of 04/12/2019 09:50  Ref. Range 02/22/2019 08:53  Vitamin D, 25-Hydroxy Latest Ref Range: 30.0 - 100.0 ng/mL 38.6   OBESITY BEHAVIORAL INTERVENTION VISIT  Today's visit was #28  Starting weight: 253 lbs Starting date: 12/14/2017 Today's weight: 252 lbs Today's date: 04/12/2019 Total lbs lost to date: 1   04/12/2019  Height 5\' 2"  (1.575 m)  Weight 252 lb (114.3 kg)  BMI (Calculated) 46.08  BLOOD PRESSURE - SYSTOLIC [id number]  BLOOD PRESSURE - DIASTOLIC 72   Body Fat % 0000000 %  Total Body Water (lbs) 88.8 lbs   ASK: We discussed the diagnosis of obesity with Geryl Councilman today and Myriam agreed to give [place] permission to discuss obesity behavioral modification therapy today.  ASSESS: Ayano has the diagnosis of obesity and her BMI today is 46.1. Julien is in the action stage of change.   ADVISE: Izetta was educated on the multiple health risks of obesity as well as the benefit of weight loss to improve her health. She was advised of the need for long term treatment and the importance of lifestyle modifications to improve her current health and to decrease her risk of future health problems.  AGREE: Multiple dietary modification options and treatment options were discussed and  Dashayla agreed to follow the recommendations documented in the above note.  ARRANGE: Jacklynn was educated on the importance of frequent visits to treat obesity as outlined per CMS and USPSTF guidelines and agreed to schedule her next follow up appointment today.  Migdalia Dk, am acting as transcriptionist for Abby Potash, PA-C I, Abby Potash, PA-C have reviewed above note and agree with its content

## 2019-04-25 ENCOUNTER — Other Ambulatory Visit: Payer: Self-pay

## 2019-04-26 ENCOUNTER — Other Ambulatory Visit: Payer: Managed Care, Other (non HMO)

## 2019-04-26 ENCOUNTER — Other Ambulatory Visit (HOSPITAL_COMMUNITY)
Admission: RE | Admit: 2019-04-26 | Discharge: 2019-04-26 | Disposition: A | Discharge status: Home / Self Care | Payer: Managed Care, Other (non HMO) | Source: Ambulatory Visit | Attending: Internal Medicine | Admitting: Internal Medicine

## 2019-04-26 ENCOUNTER — Ambulatory Visit (INDEPENDENT_AMBULATORY_CARE_PROVIDER_SITE_OTHER): Payer: Managed Care, Other (non HMO) | Admitting: Physician Assistant

## 2019-04-26 ENCOUNTER — Other Ambulatory Visit: Payer: Self-pay

## 2019-04-26 DIAGNOSIS — B20 Human immunodeficiency virus [HIV] disease: Secondary | ICD-10-CM

## 2019-04-26 DIAGNOSIS — Z113 Encounter for screening for infections with a predominantly sexual mode of transmission: Secondary | ICD-10-CM

## 2019-04-27 LAB — T-HELPER CELL (CD4) - (RCID CLINIC ONLY)
CD4 % Helper T Cell: 36 % (ref 33–65)
CD4 T Cell Abs: 1111 /uL (ref 400–1790)

## 2019-04-29 LAB — CBC WITH DIFFERENTIAL/PLATELET
Absolute Monocytes: 390 cells/uL (ref 200–950)
Basophils Absolute: 48 cells/uL (ref 0–200)
Basophils Relative: 0.8 %
Eosinophils Absolute: 78 cells/uL (ref 15–500)
Eosinophils Relative: 1.3 %
HCT: 37.7 % (ref 35.0–45.0)
Hemoglobin: 12.6 g/dL (ref 11.7–15.5)
Lymphs Abs: 3174 cells/uL (ref 850–3900)
MCH: 29.3 pg (ref 27.0–33.0)
MCHC: 33.4 g/dL (ref 32.0–36.0)
MCV: 87.7 fL (ref 80.0–100.0)
MPV: 10.5 fL (ref 7.5–12.5)
Monocytes Relative: 6.5 %
Neutro Abs: 2310 cells/uL (ref 1500–7800)
Neutrophils Relative %: 38.5 %
Platelets: 342 10*3/uL (ref 140–400)
RBC: 4.3 10*6/uL (ref 3.80–5.10)
RDW: 13.1 % (ref 11.0–15.0)
Total Lymphocyte: 52.9 %
WBC: 6 10*3/uL (ref 3.8–10.8)

## 2019-04-29 LAB — COMPLETE METABOLIC PANEL WITH GFR
AG Ratio: 1.2 (calc) (ref 1.0–2.5)
ALT: 15 U/L (ref 6–29)
AST: 15 U/L (ref 10–30)
Albumin: 4 g/dL (ref 3.6–5.1)
Alkaline phosphatase (APISO): 76 U/L (ref 31–125)
BUN: 14 mg/dL (ref 7–25)
CO2: 31 mmol/L (ref 20–32)
Calcium: 9.8 mg/dL (ref 8.6–10.2)
Chloride: 100 mmol/L (ref 98–110)
Creat: 0.92 mg/dL (ref 0.50–1.10)
GFR, Est African American: 91 mL/min/{1.73_m2} (ref >=60)
GFR, Est Non African American: 78 mL/min/{1.73_m2} (ref >=60)
Globulin: 3.3 g/dL (calc) (ref 1.9–3.7)
Glucose, Bld: 86 mg/dL (ref 65–99)
Potassium: 3.9 mmol/L (ref 3.5–5.3)
Sodium: 137 mmol/L (ref 135–146)
Total Bilirubin: 0.3 mg/dL (ref 0.2–1.2)
Total Protein: 7.3 g/dL (ref 6.1–8.1)

## 2019-04-29 LAB — HIV-1 RNA QUANT-NO REFLEX-BLD
HIV 1 RNA Quant: <20 copies/mL
HIV-1 RNA Quant, Log: <1.3 Log copies/mL

## 2019-04-29 LAB — RPR: RPR Ser Ql: NONREACTIVE

## 2019-05-02 ENCOUNTER — Other Ambulatory Visit: Payer: Self-pay | Admitting: Family Medicine

## 2019-05-02 DIAGNOSIS — I10 Essential (primary) hypertension: Secondary | ICD-10-CM

## 2019-05-02 LAB — URINE CYTOLOGY ANCILLARY ONLY
Chlamydia: NEGATIVE
Comment: NEGATIVE
Comment: NORMAL
Neisseria Gonorrhea: NEGATIVE

## 2019-05-05 ENCOUNTER — Ambulatory Visit (INDEPENDENT_AMBULATORY_CARE_PROVIDER_SITE_OTHER): Payer: Managed Care, Other (non HMO) | Admitting: Physician Assistant

## 2019-05-05 ENCOUNTER — Other Ambulatory Visit: Payer: Self-pay

## 2019-05-05 VITALS — BP 117/79 | HR 78 | Temp 98.5°F | Ht 62.0 in | Wt 254.0 lb

## 2019-05-05 DIAGNOSIS — R7303 Prediabetes: Secondary | ICD-10-CM | POA: Diagnosis not present

## 2019-05-05 DIAGNOSIS — E559 Vitamin D deficiency, unspecified: Secondary | ICD-10-CM | POA: Diagnosis not present

## 2019-05-05 DIAGNOSIS — Z9189 Other specified personal risk factors, not elsewhere classified: Secondary | ICD-10-CM

## 2019-05-05 DIAGNOSIS — Z6841 Body Mass Index (BMI) 40.0 and over, adult: Secondary | ICD-10-CM

## 2019-05-05 MED ORDER — VITAMIN D (ERGOCALCIFEROL) 1.25 MG (50000 UNIT) PO CAPS: 50000.0000 [IU] | ORAL_CAPSULE | ORAL | 0 refills | Status: DC

## 2019-05-10 ENCOUNTER — Encounter: Payer: Self-pay | Admitting: Internal Medicine

## 2019-05-10 NOTE — Progress Notes (Signed)
Office: [phone]  /  Fax: [phone]   HPI:   Chief Complaint: OBESITY Autumn Curry is here to discuss her progress with her obesity treatment plan. She is on the Category 2 plan and is following her eating plan approximately 85 % of the time. She states she is walking and doing weight training 30 to 45 minutes 3 times per week. Autumn Curry reports that she is overeating her calories. She is journaling during the day, but not at dinner. Her weight is 254 lb (115.2 kg) today and has had a weight gain of 2 pounds over a period of 3 weeks since her last visit. She has gained 1 lb since starting treatment with [place].  Vitamin D deficiency Autumn Curry has a diagnosis of vitamin D deficiency. Autumn Curry is currently taking vit D and she denies nausea, vomiting or muscle weakness.  At risk for osteopenia and osteoporosis Autumn Curry is at higher risk of osteopenia and osteoporosis due to vitamin D deficiency.   Pre-Diabetes Autumn Curry has a diagnosis of prediabetes based on her elevated Hgb A1c and was informed this puts her at greater risk of developing diabetes. Autumn Curry is on metformin currently and she denies nausea, vomiting, diarrhea or polyphagia. She continues to work on diet and exercise to decrease risk of diabetes.   ASSESSMENT AND PLAN:  Vitamin D deficiency - Plan: Vitamin D, Ergocalciferol, (DRISDOL) 1.25 MG (50000 UT) CAPS capsule  Prediabetes  At risk for osteoporosis  Class 3 severe obesity with serious comorbidity and body mass index (BMI) of 45.0 to 49.9 in adult, unspecified obesity type ([place])  PLAN:  Vitamin D Deficiency Autumn Curry was informed that low vitamin D levels contributes to fatigue and are associated with obesity, breast, and colon cancer. Autumn Curry agrees to continue to take prescription Vit D @50 ,000 IU every week #4 with no refills and she will follow up for routine testing of vitamin D, at least 2-3 times per year. She was informed of the risk of over-replacement of vitamin D  and agrees to not increase her dose unless she discusses this with [place] first. Autumn Curry agrees to follow up with our clinic in 2 weeks.  At risk for osteopenia and osteoporosis Autumn Curry was given extended  (15 minutes) osteoporosis prevention counseling today. Autumn Curry is at risk for osteopenia and osteoporosis due to her vitamin D deficiency. She was encouraged to take her vitamin D and follow her higher calcium diet and increase strengthening exercise to help strengthen her bones and decrease her risk of osteopenia and osteoporosis.  Pre-Diabetes Autumn Curry will continue to work on weight loss, exercise, and decreasing simple carbohydrates in her diet to help decrease the risk of diabetes. We dicussed metformin including benefits and risks. She was informed that eating too many simple carbohydrates or too many calories at one sitting increases the likelihood of GI side effects. Autumn Curry will continue metformin and follow up with [place] as directed to monitor her progress.  Obesity Autumn Curry is currently in the action stage of change. As such, her goal is to continue with weight loss efforts She has agreed to keep a food journal with 1200 calories and 85 grams of protein daily Autumn Curry has been instructed to work up to a goal of 150 minutes of combined cardio and strengthening exercise per week for weight loss and overall health benefits. We discussed the following Behavioral Modification Strategies today: no skipping meals and increasing lean protein intake  Autumn Curry has agreed to follow up with our clinic in 2 weeks. She was informed of  the importance of frequent follow up visits to maximize her success with intensive lifestyle modifications for her multiple health conditions.  ALLERGIES: No Known Allergies  MEDICATIONS: Current Outpatient Medications on File Prior to Visit  Medication Sig Dispense Refill   aspirin-acetaminophen-caffeine (EXCEDRIN MIGRAINE) 250-250-65 MG tablet Take by mouth every 6 (six)  hours as needed for headache.     atenolol (TENORMIN) 25 MG tablet Take 1 tablet by mouth once daily 90 tablet 0   eletriptan (RELPAX) 20 MG tablet Take 1 tablet (20 mg total) by mouth as needed for migraine or headache. One tablet by mouth at onset of headache. May repeat in 2 hours if headache persists or recurs. 10 tablet 0   hydrochlorothiazide (HYDRODIURIL) 25 MG tablet TAKE 1 TABLET BY MOUTH ONCE DAILY **  MUST  HAVE  OFFICE  VISIT  BEFORE  FURTHER  REFILLS** 90 tablet 0   Ibuprofen 200 MG CAPS Take by mouth.     loratadine (CLARITIN) 10 MG tablet Take 10 mg by mouth daily.     metFORMIN (GLUCOPHAGE) 500 MG tablet Take 1 tablet (500 mg total) by mouth daily with breakfast. 30 tablet 0   Multiple Vitamins-Minerals (ALIVE WOMENS ENERGY) TABS Take 1 tablet by mouth daily.     Omega-3-6-9 CAPS Take by mouth daily.     SYMTUZA 800-150-200-10 MG TABS TAKE 1 TABLET BY MOUTH DAILY WITH BREAKFAST. 30 tablet 0   valACYclovir (VALTREX) 500 MG tablet Take 1 tablet (500 mg total) by mouth 2 (two) times daily. 10 tablet 5   No current facility-administered medications on file prior to visit.     PAST MEDICAL HISTORY: Past Medical History:  Diagnosis Date   Absence of menstruation    Carbuncle and furuncle of unspecified site    Elevated blood pressure reading without diagnosis of hypertension    Encounter for long-term (current) use of other medications    Headache(784.0)    HIV infection ([place]) 2008   dx AIDS   Hyperlipidemia    Hypertension    Routine general medical examination at a health care facility    Screening for malignant neoplasm of the cervix    Syphilis, unspecified     PAST SURGICAL HISTORY: No past surgical history on file.  SOCIAL HISTORY: Social History   Tobacco Use   Smoking status: Never Smoker   Smokeless tobacco: Never Used  Substance Use Topics   Alcohol use: No    Alcohol/week: 0.0 standard drinks   Drug use: No    FAMILY  HISTORY: Family History  Problem Relation Age of Onset   Hyperlipidemia Mother    Hypertension Mother    Depression Mother    Coronary artery disease Father    Diabetes Father    Heart disease Father    Hyperlipidemia Father    Stroke Father    Kidney disease Father     ROS: Review of Systems  Constitutional: Negative for weight loss.  Gastrointestinal: Negative for diarrhea, nausea and vomiting.  Musculoskeletal:       Negative for muscle weakness  Endo/Heme/Allergies:       Negative for polyphagia    PHYSICAL EXAM: Blood pressure 117/79, pulse 78, temperature 98.5 F (36.9 C), temperature source Oral, height 5\' 2"  (1.575 m), weight 254 lb (115.2 kg), SpO2 98 %. Body mass index is 46.46 kg/m. Physical Exam Vitals signs reviewed.  Constitutional:      Appearance: Normal appearance. She is well-developed. She is obese.  Cardiovascular:  Rate and Rhythm: Normal rate.  Pulmonary:     Effort: Pulmonary effort is normal.  Musculoskeletal: Normal range of motion.  Skin:    General: Skin is warm and dry.  Neurological:     Mental Status: She is alert and oriented to person, place, and time.  Psychiatric:        Mood and Affect: Mood normal.        Behavior: Behavior normal.     RECENT LABS AND TESTS: BMET    Component Value Date/Time   NA 137 04/26/2019 1429   NA 138 02/22/2019 0853   K 3.9 04/26/2019 1429   CL 100 04/26/2019 1429   CO2 31 04/26/2019 1429   GLUCOSE 86 04/26/2019 1429   BUN 14 04/26/2019 1429   BUN 19 02/22/2019 0853   CREATININE 0.92 04/26/2019 1429   CALCIUM 9.8 04/26/2019 1429   GFRNONAA 78 04/26/2019 1429   GFRAA 91 04/26/2019 1429   Lab Results  Component Value Date   HGBA1C 5.7 (H) 02/22/2019   HGBA1C 5.6 09/13/2018   HGBA1C 5.7 (H) 04/22/2018   HGBA1C 5.7 (H) 12/14/2017   HGBA1C 5.6 05/25/2017   Lab Results  Component Value Date   INSULIN 60.9 (H) 02/22/2019   INSULIN 24.8 09/13/2018   INSULIN 28.7 (H) 04/22/2018    INSULIN 31.4 (H) 12/14/2017   CBC    Component Value Date/Time   WBC 6.0 04/26/2019 1429   RBC 4.30 04/26/2019 1429   HGB 12.6 04/26/2019 1429   HGB 12.5 12/14/2017 1131   HCT 37.7 04/26/2019 1429   HCT 39.3 12/14/2017 1131   PLT 342 04/26/2019 1429   MCV 87.7 04/26/2019 1429   MCV 92 12/14/2017 1131   MCH 29.3 04/26/2019 1429   MCHC 33.4 04/26/2019 1429   RDW 13.1 04/26/2019 1429   RDW 14.0 12/14/2017 1131   LYMPHSABS 3,174 04/26/2019 1429   LYMPHSABS 3.6 (H) 12/14/2017 1131   MONOABS 348 06/12/2016 1545   EOSABS 78 04/26/2019 1429   EOSABS 0.2 12/14/2017 1131   BASOSABS 48 04/26/2019 1429   BASOSABS 0.0 12/14/2017 1131   Iron/TIBC/Ferritin/ %Sat No results found for: IRON, TIBC, FERRITIN, IRONPCTSAT Lipid Panel     Component Value Date/Time   CHOL 214 (H) 02/22/2019 0853   TRIG 138 02/22/2019 0853   HDL 41 02/22/2019 0853   CHOLHDL 6 01/26/2018 1814   VLDL 25.6 01/26/2018 1814   LDLCALC 145 (H) 02/22/2019 0853   LDLCALC 152 (H) 11/20/2017 1647   Hepatic Function Panel     Component Value Date/Time   PROT 7.3 04/26/2019 1429   PROT 7.4 02/22/2019 0853   ALBUMIN 4.0 02/22/2019 0853   AST 15 04/26/2019 1429   ALT 15 04/26/2019 1429   ALKPHOS 73 02/22/2019 0853   BILITOT 0.3 04/26/2019 1429   BILITOT 0.2 02/22/2019 0853      Component Value Date/Time   TSH 1.710 12/14/2017 1131   TSH 1.420 05/25/2017 0921   TSH 1.96 08/18/2016 1624     Ref. Range 02/22/2019 08:53  Vitamin D, 25-Hydroxy Latest Ref Range: 30.0 - 100.0 ng/mL 38.6    OBESITY BEHAVIORAL INTERVENTION VISIT  Today's visit was # 29  Starting weight: 253 lbs Starting date: 12/14/2017 Today's weight : 254 lbs   Today's date: 05/05/2019 Total lbs lost to date: 0    05/05/2019  Height 5\' 2"  (1.575 m)  Weight 254 lb (115.2 kg)  BMI (Calculated) 46.45  BLOOD PRESSURE - SYSTOLIC [id number]  BLOOD PRESSURE - DIASTOLIC  79   Body Fat % 53.4 %    ASK: We discussed the diagnosis of obesity with  Geryl Councilman today and Gwynevere agreed to give [place] permission to discuss obesity behavioral modification therapy today.  ASSESS: Jesmine has the diagnosis of obesity and her BMI today is 46.45 Dhruthi is in the action stage of change   ADVISE: Alia was educated on the multiple health risks of obesity as well as the benefit of weight loss to improve her health. She was advised of the need for long term treatment and the importance of lifestyle modifications to improve her current health and to decrease her risk of future health problems.  AGREE: Multiple dietary modification options and treatment options were discussed and  Saima agreed to follow the recommendations documented in the above note.  ARRANGE: Katenia was educated on the importance of frequent visits to treat obesity as outlined per CMS and USPSTF guidelines and agreed to schedule her next follow up appointment today.  Corey Skains, am acting as transcriptionist for Abby Potash, PA-C I, Abby Potash, PA-C have reviewed above note and agree with its content

## 2019-05-16 ENCOUNTER — Encounter: Payer: Self-pay | Admitting: Internal Medicine

## 2019-05-16 ENCOUNTER — Ambulatory Visit (INDEPENDENT_AMBULATORY_CARE_PROVIDER_SITE_OTHER): Payer: Managed Care, Other (non HMO) | Admitting: Internal Medicine

## 2019-05-16 ENCOUNTER — Other Ambulatory Visit: Payer: Self-pay

## 2019-05-16 DIAGNOSIS — Z23 Encounter for immunization: Secondary | ICD-10-CM

## 2019-05-16 DIAGNOSIS — B20 Human immunodeficiency virus [HIV] disease: Secondary | ICD-10-CM

## 2019-05-16 NOTE — Assessment & Plan Note (Signed)
Her infection remains under excellent, long-term control.  She received her influenza vaccine today.  She will continue Symtuza and follow-up after lab work in 1 year.

## 2019-05-16 NOTE — Progress Notes (Signed)
Patient Active Problem List   Diagnosis Date Noted  . Human immunodeficiency virus (HIV) disease ([place]) 09/03/2006    Priority: High  . Class 3 severe obesity with serious comorbidity and body mass index (BMI) of 40.0 to 44.9 in adult ([place]) 12/23/2018  . 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 Hyperlipidemia 01/26/2018  . Shortness of breath on exertion 12/14/2017  . Essential hypertension 12/14/2017  . Preventative health care 08/18/2016  . Genital herpes 02/16/2012  . 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 BOILS, RECURRENT 08/31/2007  . HEADACHE 08/31/2007  . SYPHILIS NOS 09/11/2006    Patient's Medications  New Prescriptions   No medications on file  Previous Medications   ASPIRIN-ACETAMINOPHEN-CAFFEINE (EXCEDRIN MIGRAINE) 250-250-65 MG TABLET    Take by mouth every 6 (six) hours as needed for headache.   ATENOLOL (TENORMIN) 25 MG TABLET    Take 1 tablet by mouth once daily   ELETRIPTAN (RELPAX) 20 MG TABLET    Take 1 tablet (20 mg total) by mouth as needed for migraine or headache. One tablet by mouth at onset of headache. May repeat in 2 hours if headache persists or recurs.   HYDROCHLOROTHIAZIDE (HYDRODIURIL) 25 MG TABLET    TAKE 1 TABLET BY MOUTH ONCE DAILY **  MUST  HAVE  OFFICE  VISIT  BEFORE  FURTHER  REFILLS**   IBUPROFEN 200 MG CAPS    Take by mouth.   LORATADINE (CLARITIN) 10 MG TABLET    Take 10 mg by mouth daily.   METFORMIN (GLUCOPHAGE) 500 MG TABLET    Take 1 tablet (500 mg total) by mouth daily with breakfast.   MULTIPLE VITAMINS-MINERALS (ALIVE WOMENS ENERGY) TABS    Take 1 tablet by mouth daily.   OMEGA-3-6-9 CAPS    Take by mouth daily.   SYMTUZA 800-150-200-10 MG TABS    TAKE 1 TABLET BY MOUTH DAILY WITH BREAKFAST.   VALACYCLOVIR (VALTREX) 500 MG TABLET    Take 1 tablet (500 mg total) by mouth 2 (two) times daily.   VITAMIN D, ERGOCALCIFEROL, (DRISDOL) 1.25 MG (50000 UT) CAPS CAPSULE    Take 1 capsule (50,000 Units total) by mouth every 7 (seven) days.  Modified  Medications   No medications on file  Discontinued Medications   No medications on file    Subjective: Autumn Curry is in for her routine HIV follow-up visit.  She has not had any problems obtaining, taking or tolerating her Symtuza and does not recall missing a single dose.  She has been working from home during the [employer] pandemic.  She enjoys it.  She is trying to get some regular exercise.  She walks at least 1 mile daily and also does some resistance training.  Review of Systems: Review of Systems  Constitutional: Negative for fever and weight loss.  Respiratory: Negative for cough and shortness of breath.   Cardiovascular: Negative for chest pain.  Gastrointestinal: Negative for abdominal pain, diarrhea, nausea and vomiting.  Psychiatric/Behavioral: Negative for depression. The patient is not nervous/anxious.     Past Medical History:  Diagnosis Date  . Absence of menstruation   . Carbuncle and furuncle of unspecified site   . Elevated blood pressure reading without diagnosis of hypertension   . Encounter for long-term (current) use of other medications   . Headache(784.0)   . 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 Hyperlipidemia   . Hypertension   . Routine general medical examination at a health care facility   .  Screening for malignant neoplasm of the cervix   . Syphilis, unspecified     Social History   Tobacco Use  . Smoking status: Never Smoker  . Smokeless tobacco: Never Used  Substance Use Topics  . Alcohol use: No    Alcohol/week: 0.0 standard drinks  . Drug use: No    Family History  Problem Relation Age of Onset  . Hyperlipidemia Mother   . Hypertension Mother   . Depression Mother   . Coronary artery disease Father   . Diabetes Father   . Heart disease Father   . Hyperlipidemia Father   . Stroke Father   . Kidney disease Father     No Known Allergies  Health Maintenance  Topic Date Due  . INFLUENZA VACCINE  02/12/2019  . PAP SMEAR-Modifier   06/01/2019  . TETANUS/TDAP  07/11/2026  . HIV Screening  Completed    Objective:  Vitals:   05/16/19 1438  BP: 120/83  Pulse: 70  Temp: 98.6 F (37 C)  TempSrc: Oral  Weight: 252 lb 6.4 oz (114.5 kg)   Body mass index is 46.16 kg/m.  Physical Exam Constitutional:      Comments: She is in good spirits as usual.  Cardiovascular:     Rate and Rhythm: Normal rate and regular rhythm.     Heart sounds: No murmur.  Pulmonary:     Effort: Pulmonary effort is normal.     Breath sounds: Normal breath sounds.  Abdominal:     Palpations: Abdomen is soft.     Tenderness: There is no abdominal tenderness.  Psychiatric:        Mood and Affect: Mood normal.     Lab Results Lab Results  Component Value Date   WBC 6.0 04/26/2019   HGB 12.6 04/26/2019   HCT 37.7 04/26/2019   MCV 87.7 04/26/2019   PLT 342 04/26/2019    Lab Results  Component Value Date   CREATININE 0.92 04/26/2019   BUN 14 04/26/2019   NA 137 04/26/2019   K 3.9 04/26/2019   CL 100 04/26/2019   CO2 31 04/26/2019    Lab Results  Component Value Date   ALT 15 04/26/2019   AST 15 04/26/2019   ALKPHOS 73 02/22/2019   BILITOT 0.3 04/26/2019    Lab Results  Component Value Date   CHOL 214 (H) 02/22/2019   HDL 41 02/22/2019   LDLCALC 145 (H) 02/22/2019   TRIG 138 02/22/2019   CHOLHDL 6 01/26/2018   Lab Results  Component Value Date   LABRPR NON-REACTIVE 04/26/2019   HIV 1 RNA Quant (copies/mL)  Date Value  04/26/2019 <20 NOT DETECTED  04/07/2018 <20 NOT DETECTED  06/12/2016 <20   CD4 T Cell Abs (/uL)  Date Value  04/26/2019 1,111  04/07/2018 1,180  06/12/2016 960     Problem List Items Addressed This Visit      High   Human immunodeficiency virus (HIV) disease ([place])    Her infection remains under excellent, long-term control.  She received her influenza vaccine today.  She will continue Symtuza and follow-up after lab work in 1 year.      Relevant Orders   CBC   T-helper cell (CD4)-  ([facility] clinic only)   Comprehensive metabolic panel   Lipid panel   RPR   HIV-1 RNA quant-no reflex-bld        Michel Bickers, MD [facility] [place] [phone] pager   [phone] cell  05/16/2019, 2:51 PM

## 2019-05-24 ENCOUNTER — Other Ambulatory Visit: Payer: Self-pay

## 2019-05-24 ENCOUNTER — Encounter (INDEPENDENT_AMBULATORY_CARE_PROVIDER_SITE_OTHER): Payer: Self-pay | Admitting: Physician Assistant

## 2019-05-24 ENCOUNTER — Ambulatory Visit (INDEPENDENT_AMBULATORY_CARE_PROVIDER_SITE_OTHER): Payer: Managed Care, Other (non HMO) | Admitting: Physician Assistant

## 2019-05-24 VITALS — BP 110/68 | HR 61 | Temp 98.4°F | Ht 62.0 in | Wt 249.0 lb

## 2019-05-24 DIAGNOSIS — Z6841 Body Mass Index (BMI) 40.0 and over, adult: Secondary | ICD-10-CM

## 2019-05-24 DIAGNOSIS — E7849 Other hyperlipidemia: Secondary | ICD-10-CM

## 2019-05-25 ENCOUNTER — Other Ambulatory Visit: Payer: Self-pay

## 2019-05-25 NOTE — Progress Notes (Signed)
Office: [phone]  /  Fax: [phone]   HPI:   Chief Complaint: OBESITY Autumn Curry is here to discuss her progress with her obesity treatment plan. She is on the keep a food journal with 1200 calories and 85 grams of protein daily and is following her eating plan approximately 85 % of the time. She states she is cardio and walking for 20-30 minutes 3 times per week. Autumn Curry reports that she did a better job journaling all day. She wants to discuss holiday eating strategies.  Her weight is 249 lb (112.9 kg) today and has had a weight loss of 5 pounds over a period of 3 weeks since her last visit. She has lost 4 lbs since starting treatment with [place].  Hyperlipidemia Autumn Curry has hyperlipidemia and has been trying to improve her cholesterol levels with intensive lifestyle modification including a low saturated fat diet, exercise and weight loss. She is not on any medications and denies any chest pain, claudication or myalgias.  ASSESSMENT AND PLAN:  Other hyperlipidemia  Class 3 severe obesity with serious comorbidity and body mass index (BMI) of 45.0 to 49.9 in adult, unspecified obesity type (Autumn Curry)  PLAN:  Hyperlipidemia Autumn Curry was informed of the American Heart Association Guidelines emphasizing intensive lifestyle modifications as the first line treatment for hyperlipidemia. We discussed many lifestyle modifications today in depth, and Autumn Curry will continue to work on decreasing saturated fats such as fatty red meat, butter and many fried foods. She will also increase vegetables and lean protein in her diet and continue to work on exercise and weight loss efforts.  I spent > than 50% of the 15 minute visit on counseling as documented in the note.  Obesity Autumn Curry is currently in the action stage of change. As such, her goal is to continue with weight loss efforts She has agreed to keep a food journal with 1200 calories and 85 grams of protein daily Autumn Curry has been instructed to  work up to a goal of 150 minutes of combined cardio and strengthening exercise per week for weight loss and overall health benefits. We discussed the following Behavioral Modification Strategies today: work on meal planning and easy cooking plans, holiday eating strategies, and keeping healthy foods in the home  Holiday recipes were given today.  Autumn Curry has agreed to follow up with our clinic in 3 weeks. She was informed of the importance of frequent follow up visits to maximize her success with intensive lifestyle modifications for her multiple health conditions.  ALLERGIES: No Known Allergies  MEDICATIONS: Current Outpatient Medications on File Prior to Visit  Medication Sig Dispense Refill  . aspirin-acetaminophen-caffeine (EXCEDRIN MIGRAINE) 250-250-65 MG tablet Take by mouth every 6 (six) hours as needed for headache.    [name] atenolol (TENORMIN) 25 MG tablet Take 1 tablet by mouth once daily 90 tablet 0  . eletriptan (RELPAX) 20 MG tablet Take 1 tablet (20 mg total) by mouth as needed for migraine or headache. One tablet by mouth at onset of headache. May repeat in 2 hours if headache persists or recurs. 10 tablet 0  . hydrochlorothiazide (HYDRODIURIL) 25 MG tablet TAKE 1 TABLET BY MOUTH ONCE DAILY **  MUST  HAVE  OFFICE  VISIT  BEFORE  FURTHER  REFILLS** 90 tablet 0  . Ibuprofen 200 MG CAPS Take by mouth.    . loratadine (CLARITIN) 10 MG tablet Take 10 mg by mouth daily.    . metFORMIN (GLUCOPHAGE) 500 MG tablet Take 1 tablet (500 mg total) by mouth  daily with breakfast. 30 tablet 0  . Multiple Vitamins-Minerals (ALIVE WOMENS ENERGY) TABS Take 1 tablet by mouth daily.    . Omega-3-6-9 CAPS Take by mouth daily.    . SYMTUZA 800-150-200-10 MG TABS TAKE 1 TABLET BY MOUTH DAILY WITH BREAKFAST. 30 tablet 0  . valACYclovir (VALTREX) 500 MG tablet Take 1 tablet (500 mg total) by mouth 2 (two) times daily. 10 tablet 5  . Vitamin D, Ergocalciferol, (DRISDOL) 1.25 MG (50000 UT) CAPS capsule Take 1  capsule (50,000 Units total) by mouth every 7 (seven) days. 4 capsule 0   No current facility-administered medications on file prior to visit.     PAST MEDICAL HISTORY: Past Medical History:  Diagnosis Date  . Absence of menstruation   . Carbuncle and furuncle of unspecified site   . Elevated blood pressure reading without diagnosis of hypertension   . Encounter for long-term (current) use of other medications   . Headache(784.0)   . 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 Hyperlipidemia   . Hypertension   . Routine general medical examination at a health care facility   . Screening for malignant neoplasm of the cervix   . Syphilis, unspecified     PAST SURGICAL HISTORY: History reviewed. No pertinent surgical history.  SOCIAL HISTORY: Social History   Tobacco Use  . Smoking status: Never Smoker  . Smokeless tobacco: Never Used  Substance Use Topics  . Alcohol use: No    Alcohol/week: 0.0 standard drinks  . Drug use: No    FAMILY HISTORY: Family History  Problem Relation Age of Onset  . Hyperlipidemia Mother   . Hypertension Mother   . Depression Mother   . Coronary artery disease Father   . Diabetes Father   . Heart disease Father   . Hyperlipidemia Father   . Stroke Father   . Kidney disease Father     ROS: Review of Systems  Constitutional: Positive for weight loss.  Cardiovascular: Negative for chest pain and claudication.  Musculoskeletal: Negative for myalgias.    PHYSICAL EXAM: Blood pressure 110/68, pulse 61, temperature 98.4 F (36.9 C), temperature source Oral, height 5\' 2"  (1.575 m), weight 249 lb (112.9 kg), last menstrual period 05/14/2019, SpO2 100 %. Body mass index is 45.54 kg/m. Physical Exam Vitals signs reviewed.  Constitutional:      Appearance: Normal appearance. She is obese.  Cardiovascular:     Rate and Rhythm: Normal rate.     Pulses: Normal pulses.  Pulmonary:     Effort: Pulmonary effort is normal.     Breath sounds:  Normal breath sounds.  Musculoskeletal: Normal range of motion.  Skin:    General: Skin is warm and dry.  Neurological:     Mental Status: She is alert and oriented to person, place, and time.  Psychiatric:        Mood and Affect: Mood normal.        Behavior: Behavior normal.     RECENT LABS AND TESTS: BMET    Component Value Date/Time   NA 137 04/26/2019 1429   NA 138 02/22/2019 0853   K 3.9 04/26/2019 1429   CL 100 04/26/2019 1429   CO2 31 04/26/2019 1429   GLUCOSE 86 04/26/2019 1429   BUN 14 04/26/2019 1429   BUN 19 02/22/2019 0853   CREATININE 0.92 04/26/2019 1429   CALCIUM 9.8 04/26/2019 1429   GFRNONAA 78 04/26/2019 1429   GFRAA 91 04/26/2019 1429   Lab Results  Component Value Date   HGBA1C 5.7 (H) 02/22/2019   HGBA1C 5.6 09/13/2018   HGBA1C 5.7 (H) 04/22/2018   HGBA1C 5.7 (H) 12/14/2017   HGBA1C 5.6 05/25/2017   Lab Results  Component Value Date   INSULIN 60.9 (H) 02/22/2019   INSULIN 24.8 09/13/2018   INSULIN 28.7 (H) 04/22/2018   INSULIN 31.4 (H) 12/14/2017   CBC    Component Value Date/Time   WBC 6.0 04/26/2019 1429   RBC 4.30 04/26/2019 1429   HGB 12.6 04/26/2019 1429   HGB 12.5 12/14/2017 1131   HCT 37.7 04/26/2019 1429   HCT 39.3 12/14/2017 1131   PLT 342 04/26/2019 1429   MCV 87.7 04/26/2019 1429   MCV 92 12/14/2017 1131   MCH 29.3 04/26/2019 1429   MCHC 33.4 04/26/2019 1429   RDW 13.1 04/26/2019 1429   RDW 14.0 12/14/2017 1131   LYMPHSABS 3,174 04/26/2019 1429   LYMPHSABS 3.6 (H) 12/14/2017 1131   MONOABS 348 06/12/2016 1545   EOSABS 78 04/26/2019 1429   EOSABS 0.2 12/14/2017 1131   BASOSABS 48 04/26/2019 1429   BASOSABS 0.0 12/14/2017 1131   Iron/TIBC/Ferritin/ %Sat No results found for: IRON, TIBC, FERRITIN, IRONPCTSAT Lipid Panel     Component Value Date/Time   CHOL 214 (H) 02/22/2019 0853   TRIG 138 02/22/2019 0853   HDL 41 02/22/2019 0853   CHOLHDL 6 01/26/2018 1814   VLDL 25.6 01/26/2018 1814   LDLCALC 145 (H)  02/22/2019 0853   LDLCALC 152 (H) 11/20/2017 1647   Hepatic Function Panel     Component Value Date/Time   PROT 7.3 04/26/2019 1429   PROT 7.4 02/22/2019 0853   ALBUMIN 4.0 02/22/2019 0853   AST 15 04/26/2019 1429   ALT 15 04/26/2019 1429   ALKPHOS 73 02/22/2019 0853   BILITOT 0.3 04/26/2019 1429   BILITOT 0.2 02/22/2019 0853      Component Value Date/Time   TSH 1.710 12/14/2017 1131   TSH 1.420 05/25/2017 0921   TSH 1.96 08/18/2016 1624      OBESITY BEHAVIORAL INTERVENTION VISIT  Today's visit was # 3   Starting weight: 253 lbs Starting date: 12/14/17 Today's weight : 249 lbs Today's date: 05/24/2019 Total lbs lost to date: 4    ASK: We discussed the diagnosis of obesity with Autumn Curry today and Autumn Curry agreed to give [place] permission to discuss obesity behavioral modification therapy today.  ASSESS: Autumn Curry has the diagnosis of obesity and her BMI today is 45.53 Autumn Curry is in the action stage of change   ADVISE: Autumn Curry was educated on the multiple health risks of obesity as well as the benefit of weight loss to improve her health. She was advised of the need for long term treatment and the importance of lifestyle modifications to improve her current health and to decrease her risk of future health problems.  AGREE: Multiple dietary modification options and treatment options were discussed and  Autumn Curry agreed to follow the recommendations documented in the above note.  ARRANGE: Autumn Curry was educated on the importance of frequent visits to treat obesity as outlined per CMS and USPSTF guidelines and agreed to schedule her next follow up appointment today.  Autumn Curry, am acting as transcriptionist for Abby Potash, PA-C I, Abby Potash, PA-C have reviewed above note and agree with its content

## 2019-05-26 ENCOUNTER — Other Ambulatory Visit (HOSPITAL_COMMUNITY)
Admission: RE | Admit: 2019-05-26 | Discharge: 2019-05-26 | Disposition: A | Discharge status: Home / Self Care | Payer: Managed Care, Other (non HMO) | Source: Ambulatory Visit | Attending: Family Medicine | Admitting: Family Medicine

## 2019-05-26 ENCOUNTER — Ambulatory Visit (INDEPENDENT_AMBULATORY_CARE_PROVIDER_SITE_OTHER): Payer: Managed Care, Other (non HMO) | Admitting: Family Medicine

## 2019-05-26 ENCOUNTER — Encounter: Payer: Self-pay | Admitting: Family Medicine

## 2019-05-26 VITALS — BP 130/80 | HR 80 | Temp 97.6°F | Resp 18 | Ht 62.0 in | Wt 250.4 lb

## 2019-05-26 DIAGNOSIS — Z23 Encounter for immunization: Secondary | ICD-10-CM

## 2019-05-26 DIAGNOSIS — Z0000 Encounter for general adult medical examination without abnormal findings: Secondary | ICD-10-CM | POA: Diagnosis not present

## 2019-05-26 DIAGNOSIS — Z124 Encounter for screening for malignant neoplasm of cervix: Secondary | ICD-10-CM | POA: Diagnosis not present

## 2019-05-26 DIAGNOSIS — E559 Vitamin D deficiency, unspecified: Secondary | ICD-10-CM

## 2019-05-26 NOTE — Progress Notes (Signed)
++Subjective:     Autumn Curry is a 38 y.o. female and is here for a comprehensive physical exam. The patient reports no problems.  Social History   Socioeconomic History  . Marital status: Married    Spouse name: Autym Louk  . Number of children: 1  . Years of education: Not on file  . Highest education level: Not on file  Occupational History  . Occupation: Pharmacist, hospital    Comment: head start   Social Needs  . Financial resource strain: Not on file  . Food insecurity    Worry: Not on file    Inability: Not on file  . Transportation needs    Medical: Not on file    Non-medical: Not on file  Tobacco Use  . Smoking status: Never Smoker  . Smokeless tobacco: Never Used  Substance and Sexual Activity  . Alcohol use: No    Alcohol/week: 0.0 standard drinks  . Drug use: No  . Sexual activity: Yes    Partners: Male    Birth control/protection: Condom    Comment: given condoms 05/2019  Lifestyle  . Physical activity    Days per week: Not on file    Minutes per session: Not on file  . Stress: Not on file  Relationships  . Social Herbalist on phone: Not on file    Gets together: Not on file    Attends religious service: Not on file    Active member of club or organization: Not on file    Attends meetings of clubs or organizations: Not on file    Relationship status: Not on file  . Intimate partner violence    Fear of current or ex partner: Not on file    Emotionally abused: Not on file    Physically abused: Not on file    Forced sexual activity: Not on file  Other Topics Concern  . Not on file  Social History Narrative  . Not on file   Health Maintenance  Topic Date Due  . PAP SMEAR-Modifier  05/25/2020  . TETANUS/TDAP  07/11/2026  . INFLUENZA VACCINE  Completed  . HIV Screening  Completed    The following portions of the patient's history were reviewed and updated as appropriate: She  has a past medical history of Absence of menstruation,  Carbuncle and furuncle of unspecified site, Elevated blood pressure reading without diagnosis of hypertension, Encounter for long-term (current) use of other medications, Headache(784.0), HIV infection ([place]) (2008), Hyperlipidemia, Hypertension, Routine general medical examination at a health care facility, Screening for malignant neoplasm of the cervix, and Syphilis, unspecified. She does not have any pertinent problems on file. She  has no past surgical history on file. Her family history includes Coronary artery disease in her father; Depression in her mother; Diabetes in her father; Heart disease in her father; Hyperlipidemia in her father and mother; Hypertension in her mother; Kidney disease in her father; Stroke in her father. She  reports that she has never smoked. She has never used smokeless tobacco. She reports that she does not drink alcohol or use drugs. She has a current medication list which includes the following prescription(s): aspirin-acetaminophen-caffeine, atenolol, eletriptan, hydrochlorothiazide, ibuprofen, loratadine, metformin, alive womens energy, omega-3-6-9, symtuza, valacyclovir, and vitamin d (ergocalciferol). Current Outpatient Medications on File Prior to Visit  Medication Sig Dispense Refill  . aspirin-acetaminophen-caffeine (EXCEDRIN MIGRAINE) 250-250-65 MG tablet Take by mouth every 6 (six) hours as needed for headache.    [name] atenolol (TENORMIN)  25 MG tablet Take 1 tablet by mouth once daily 90 tablet 0  . eletriptan (RELPAX) 20 MG tablet Take 1 tablet (20 mg total) by mouth as needed for migraine or headache. One tablet by mouth at onset of headache. May repeat in 2 hours if headache persists or recurs. 10 tablet 0  . hydrochlorothiazide (HYDRODIURIL) 25 MG tablet TAKE 1 TABLET BY MOUTH ONCE DAILY **  MUST  HAVE  OFFICE  VISIT  BEFORE  FURTHER  REFILLS** 90 tablet 0  . Ibuprofen 200 MG CAPS Take by mouth.    . loratadine (CLARITIN) 10 MG tablet Take 10 mg by mouth  daily.    . metFORMIN (GLUCOPHAGE) 500 MG tablet Take 1 tablet (500 mg total) by mouth daily with breakfast. 30 tablet 0  . Multiple Vitamins-Minerals (ALIVE WOMENS ENERGY) TABS Take 1 tablet by mouth daily.    . Omega-3-6-9 CAPS Take by mouth daily.    . SYMTUZA 800-150-200-10 MG TABS TAKE 1 TABLET BY MOUTH DAILY WITH BREAKFAST. 30 tablet 0  . valACYclovir (VALTREX) 500 MG tablet Take 1 tablet (500 mg total) by mouth 2 (two) times daily. 10 tablet 5  . Vitamin D, Ergocalciferol, (DRISDOL) 1.25 MG (50000 UT) CAPS capsule Take 1 capsule (50,000 Units total) by mouth every 7 (seven) days. 4 capsule 0   No current facility-administered medications on file prior to visit.    She has No Known Allergies..  Review of Systems Review of Systems  Constitutional: Negative for activity change, appetite change and fatigue.  HENT: Negative for hearing loss, congestion, tinnitus and ear discharge.  dentist q63m Eyes: Negative for visual disturbance (see optho q1y -- vision corrected to 20/20 with glasses).  Respiratory: Negative for cough, chest tightness and shortness of breath.   Cardiovascular: Negative for chest pain, palpitations and leg swelling.  Gastrointestinal: Negative for abdominal pain, diarrhea, constipation and abdominal distention.  Genitourinary: Negative for urgency, frequency, decreased urine volume and difficulty urinating.  Musculoskeletal: Negative for back pain, arthralgias and gait problem.  Skin: Negative for color change, pallor and rash.  Neurological: Negative for dizziness, light-headedness, numbness and headaches.  Hematological: Negative for adenopathy. Does not bruise/bleed easily.  Psychiatric/Behavioral: Negative for suicidal ideas, confusion, sleep disturbance, self-injury, dysphoric mood, decreased concentration and agitation.       Objective:    BP 130/80 (BP Location: Right Arm, Patient Position: Sitting, Cuff Size: Large)   Pulse 80   Temp 97.6 F (36.4 C)  (Temporal)   Resp 18   Ht 5\' 2"  (1.575 m)   Wt 250 lb 6.4 oz (113.6 kg)   LMP 05/14/2019 (Approximate)   SpO2 98%   BMI 45.80 kg/m  General appearance: alert, cooperative, appears stated age and no distress Head: Normocephalic, without obvious abnormality, atraumatic Eyes: negative findings: lids and lashes normal, conjunctivae and sclerae normal and pupils equal, round, reactive to light and accomodation Ears: normal TM's and external ear canals both ears Neck: no adenopathy, no carotid bruit, no JVD, supple, symmetrical, trachea midline and thyroid not enlarged, symmetric, no tenderness/mass/nodules Back: symmetric, no curvature. ROM normal. No CVA tenderness. Lungs: clear to auscultation bilaterally Breasts: normal appearance, no masses or tenderness Heart: regular rate and rhythm, S1, S2 normal, no murmur, click, rub or gallop Abdomen: soft, non-tender; bowel sounds normal; no masses,  no organomegaly Pelvic: cervix normal in appearance, exam obscured by obesity, external genitalia normal, no adnexal masses or tenderness, no bladder tenderness, no cervical motion tenderness and vagina normal without discharge Extremities:  extremities normal, atraumatic, no cyanosis or edema Pulses: 2+ and symmetric Skin: Skin color, texture, turgor normal. No rashes or lesions Lymph nodes: Cervical, supraclavicular, and axillary nodes normal. Neurologic: Alert and oriented X 3, normal strength and tone. Normal symmetric reflexes. Normal coordination and gait    Assessment:    Healthy female exam.      Plan:    ghm utd Check labs  See After Visit Summary for Counseling Recommendations    1. Preventative health care See above  - CBC with Differential - Lipid panel - Comprehensive metabolic panel - TSH - Cytology - PAP( [place])  2. Vitamin D deficiency   - Vitamin D (25 hydroxy)  3. Morbid obesity ([place]) con't healthy weight and wellness - Hemoglobin A1c - Insulin, random  4.  Cervical cancer screening   - Pap liquid-based and HPV (high risk) - Cytology - PAP( [facility])  5. Need for pneumococcal vaccination   - Pneumococcal vaccine 23

## 2019-05-26 NOTE — Patient Instructions (Signed)
Preventive Care 21-39 Years Old, Female Preventive care refers to visits with your health care provider and lifestyle choices that can promote health and wellness. This includes:  A yearly physical exam. This may also be called an annual well check.  Regular dental visits and eye exams.  Immunizations.  Screening for certain conditions.  Healthy lifestyle choices, such as eating a healthy diet, getting regular exercise, not using drugs or products that contain nicotine and tobacco, and limiting alcohol use. What can I expect for my preventive care visit? Physical exam Your health care provider will check your:  Height and weight. This may be used to calculate body mass index (BMI), which tells if you are at a healthy weight.  Heart rate and blood pressure.  Skin for abnormal spots. Counseling Your health care provider may ask you questions about your:  Alcohol, tobacco, and drug use.  Emotional well-being.  Home and relationship well-being.  Sexual activity.  Eating habits.  Work and work environment.  Method of birth control.  Menstrual cycle.  Pregnancy history. What immunizations do I need?  Influenza (flu) vaccine  This is recommended every year. Tetanus, diphtheria, and pertussis (Tdap) vaccine  You may need a Td booster every 10 years. Varicella (chickenpox) vaccine  You may need this if you have not been vaccinated. Human papillomavirus (HPV) vaccine  If recommended by your health care provider, you may need three doses over 6 months. Measles, mumps, and rubella (MMR) vaccine  You may need at least one dose of MMR. You may also need a second dose. Meningococcal conjugate (MenACWY) vaccine  One dose is recommended if you are age 19-21 years and a first-year college student living in a residence hall, or if you have one of several medical conditions. You may also need additional booster doses. Pneumococcal conjugate (PCV13) vaccine  You may need  this if you have certain conditions and were not previously vaccinated. Pneumococcal polysaccharide (PPSV23) vaccine  You may need one or two doses if you smoke cigarettes or if you have certain conditions. Hepatitis A vaccine  You may need this if you have certain conditions or if you travel or work in places where you may be exposed to hepatitis A. Hepatitis B vaccine  You may need this if you have certain conditions or if you travel or work in places where you may be exposed to hepatitis B. Haemophilus influenzae type b (Hib) vaccine  You may need this if you have certain conditions. You may receive vaccines as individual doses or as more than one vaccine together in one shot (combination vaccines). Talk with your health care provider about the risks and benefits of combination vaccines. What tests do I need?  Blood tests  Lipid and cholesterol levels. These may be checked every 5 years starting at age 20.  Hepatitis C test.  Hepatitis B test. Screening  Diabetes screening. This is done by checking your blood sugar (glucose) after you have not eaten for a while (fasting).  Sexually transmitted disease (STD) testing.  BRCA-related cancer screening. This may be done if you have a family history of breast, ovarian, tubal, or peritoneal cancers.  Pelvic exam and Pap test. This may be done every 3 years starting at age 21. Starting at age 30, this may be done every 5 years if you have a Pap test in combination with an HPV test. Talk with your health care provider about your test results, treatment options, and if necessary, the need for more tests.   Follow these instructions at home: Eating and drinking   Eat a diet that includes fresh fruits and vegetables, whole grains, lean protein, and low-fat dairy.  Take vitamin and mineral supplements as recommended by your health care provider.  Do not drink alcohol if: ? Your health care provider tells you not to drink. ? You are  pregnant, may be pregnant, or are planning to become pregnant.  If you drink alcohol: ? Limit how much you have to 0-1 drink a day. ? Be aware of how much alcohol is in your drink. In the [place] one drink equals one 12 oz bottle of beer (355 mL), one 5 oz glass of wine (148 mL), or one 1 oz glass of hard liquor (44 mL). Lifestyle  Take daily care of your teeth and gums.  Stay active. Exercise for at least 30 minutes on 5 or more days each week.  Do not use any products that contain nicotine or tobacco, such as cigarettes, e-cigarettes, and chewing tobacco. If you need help quitting, ask your health care provider.  If you are sexually active, practice safe sex. Use a condom or other form of birth control (contraception) in order to prevent pregnancy and STIs (sexually transmitted infections). If you plan to become pregnant, see your health care provider for a preconception visit. What's next?  Visit your health care provider once a year for a well check visit.  Ask your health care provider how often you should have your eyes and teeth checked.  Stay up to date on all vaccines. This information is not intended to replace advice given to you by your health care provider. Make sure you discuss any questions you have with your health care provider. Document Released: 08/26/2001 Document Revised: 03/11/2018 Document Reviewed: 03/11/2018 Elsevier Patient Education  2020 [employer]  

## 2019-05-27 LAB — TSH: TSH: 1.89 u[IU]/mL (ref 0.35–4.50)

## 2019-05-27 LAB — CBC WITH DIFFERENTIAL/PLATELET
Basophils Absolute: 0.1 10*3/uL (ref 0.0–0.1)
Basophils Relative: 0.9 % (ref 0.0–3.0)
Eosinophils Absolute: 0.1 10*3/uL (ref 0.0–0.7)
Eosinophils Relative: 0.9 % (ref 0.0–5.0)
HCT: 36.3 % (ref 36.0–46.0)
Hemoglobin: 12.1 g/dL (ref 12.0–15.0)
Lymphocytes Relative: 48.2 % — ABNORMAL HIGH (ref 12.0–46.0)
Lymphs Abs: 3.1 10*3/uL (ref 0.7–4.0)
MCHC: 33.3 g/dL (ref 30.0–36.0)
MCV: 89.1 fl (ref 78.0–100.0)
Monocytes Absolute: 0.4 10*3/uL (ref 0.1–1.0)
Monocytes Relative: 6.2 % (ref 3.0–12.0)
Neutro Abs: 2.8 10*3/uL (ref 1.4–7.7)
Neutrophils Relative %: 43.8 % (ref 43.0–77.0)
Platelets: 343 10*3/uL (ref 150.0–400.0)
RBC: 4.07 Mil/uL (ref 3.87–5.11)
RDW: 13.5 % (ref 11.5–15.5)
WBC: 6.3 10*3/uL (ref 4.0–10.5)

## 2019-05-27 LAB — COMPREHENSIVE METABOLIC PANEL
ALT: 13 U/L (ref 0–35)
AST: 15 U/L (ref 0–37)
Albumin: 4.1 g/dL (ref 3.5–5.2)
Alkaline Phosphatase: 73 U/L (ref 39–117)
BUN: 18 mg/dL (ref 6–23)
CO2: 29 mEq/L (ref 19–32)
Calcium: 9.4 mg/dL (ref 8.4–10.5)
Chloride: 102 mEq/L (ref 96–112)
Creatinine, Ser: 0.84 mg/dL (ref 0.40–1.20)
GFR: 91.26 mL/min (ref >60.00)
Glucose, Bld: 98 mg/dL (ref 70–99)
Potassium: 3.8 mEq/L (ref 3.5–5.1)
Sodium: 139 mEq/L (ref 135–145)
Total Bilirubin: 0.3 mg/dL (ref 0.2–1.2)
Total Protein: 7.3 g/dL (ref 6.0–8.3)

## 2019-05-27 LAB — LIPID PANEL
Cholesterol: 219 mg/dL — ABNORMAL HIGH (ref 0–200)
HDL: 37 mg/dL — ABNORMAL LOW (ref >39.00)
LDL Cholesterol: 144 mg/dL — ABNORMAL HIGH (ref 0–99)
NonHDL: 182.37
Total CHOL/HDL Ratio: 6
Triglycerides: 194 mg/dL — ABNORMAL HIGH (ref 0.0–149.0)
VLDL: 38.8 mg/dL (ref 0.0–40.0)

## 2019-05-27 LAB — INSULIN, RANDOM: Insulin: 62.9 u[IU]/mL — ABNORMAL HIGH

## 2019-05-27 LAB — HEMOGLOBIN A1C: Hgb A1c MFr Bld: 5.6 % (ref 4.6–6.5)

## 2019-05-27 LAB — VITAMIN D 25 HYDROXY (VIT D DEFICIENCY, FRACTURES): VITD: 40.65 ng/mL (ref 30.00–100.00)

## 2019-06-01 ENCOUNTER — Other Ambulatory Visit: Payer: Self-pay | Admitting: Internal Medicine

## 2019-06-01 DIAGNOSIS — B20 Human immunodeficiency virus [HIV] disease: Secondary | ICD-10-CM

## 2019-06-01 LAB — CYTOLOGY - PAP
Chlamydia: NEGATIVE
Comment: NEGATIVE
Comment: NEGATIVE
Comment: NEGATIVE
Comment: NORMAL
Diagnosis: NEGATIVE
HSV1: NEGATIVE
HSV2: NEGATIVE
Neisseria Gonorrhea: NEGATIVE
Trichomonas: NEGATIVE

## 2019-06-03 ENCOUNTER — Other Ambulatory Visit: Payer: Self-pay | Admitting: Family Medicine

## 2019-06-03 DIAGNOSIS — E1169 Type 2 diabetes mellitus with other specified complication: Secondary | ICD-10-CM

## 2019-06-03 DIAGNOSIS — E785 Hyperlipidemia, unspecified: Secondary | ICD-10-CM

## 2019-06-14 ENCOUNTER — Other Ambulatory Visit: Payer: Self-pay | Admitting: *Deleted

## 2019-06-14 ENCOUNTER — Encounter (INDEPENDENT_AMBULATORY_CARE_PROVIDER_SITE_OTHER): Payer: Self-pay | Admitting: Physician Assistant

## 2019-06-14 ENCOUNTER — Ambulatory Visit (INDEPENDENT_AMBULATORY_CARE_PROVIDER_SITE_OTHER): Payer: Managed Care, Other (non HMO) | Admitting: Physician Assistant

## 2019-06-14 ENCOUNTER — Other Ambulatory Visit: Payer: Self-pay

## 2019-06-14 VITALS — BP 129/83 | HR 83 | Temp 98.4°F | Ht 62.0 in | Wt 251.0 lb

## 2019-06-14 DIAGNOSIS — B20 Human immunodeficiency virus [HIV] disease: Secondary | ICD-10-CM

## 2019-06-14 DIAGNOSIS — E559 Vitamin D deficiency, unspecified: Secondary | ICD-10-CM

## 2019-06-14 DIAGNOSIS — Z6841 Body Mass Index (BMI) 40.0 and over, adult: Secondary | ICD-10-CM

## 2019-06-14 DIAGNOSIS — R7303 Prediabetes: Secondary | ICD-10-CM

## 2019-06-14 DIAGNOSIS — E7849 Other hyperlipidemia: Secondary | ICD-10-CM | POA: Diagnosis not present

## 2019-06-14 DIAGNOSIS — Z9189 Other specified personal risk factors, not elsewhere classified: Secondary | ICD-10-CM

## 2019-06-14 MED ORDER — SYMTUZA 800-150-200-10 MG PO TABS: 1.0000 | ORAL_TABLET | Freq: Every day | ORAL | 11 refills | Status: DC

## 2019-06-14 MED ORDER — VITAMIN D (ERGOCALCIFEROL) 1.25 MG (50000 UNIT) PO CAPS: 50000.0000 [IU] | ORAL_CAPSULE | ORAL | 0 refills | Status: DC

## 2019-06-14 MED ORDER — METFORMIN HCL 500 MG PO TABS: 500.0000 mg | ORAL_TABLET | Freq: Every day | ORAL | 0 refills | Status: DC

## 2019-06-15 NOTE — Progress Notes (Signed)
Office: [phone]  /  Fax: [phone]   HPI:   Chief Complaint: OBESITY Autumn Curry is here to discuss her progress with her obesity treatment plan. She is on the Category 2 plan and is following her eating plan approximately 75 % of the time. She states she is exercising 0 minutes 0 times per week. Autumn Curry reports that she was of the plan for a few days, due to celebrations and Thanksgiving. She is ready to get back on track. Her weight is 251 lb (113.9 kg) today and has had a weight gain of 2 pounds over a period of 3 weeks since her last visit. She has lost 2 lbs since starting treatment with [place].  Vitamin D deficiency Autumn Curry has a diagnosis of vitamin D deficiency. Her last level was at 40.65 (05/26/19), and was not at goal. She states she is intermittently fatigued. Autumn Curry is currently taking vit D and she denies nausea, vomiting or muscle weakness. Labs were reviewed today.  At risk for osteopenia and osteoporosis Autumn Curry is at higher risk of osteopenia and osteoporosis due to vitamin D deficiency.   Hyperlipidemia Autumn Curry has hyperlipidemia and she is not on medications. She has been trying to improve her cholesterol levels with intensive lifestyle modification including a low saturated fat diet, exercise and weight loss. She denies any chest pain. Lipids were reviewed today, and are not in the normal range.  Pre-Diabetes Autumn Curry has a diagnosis of prediabetes based on her elevated Hgb A1c and was informed this puts her at greater risk of developing diabetes. Autumn Curry is on metformin and she denies nausea or vomiting, but she does report diarrhea. She has no blood in her stool or black stools. Autumn Curry continues to work on diet and exercise to decrease risk of diabetes.   ASSESSMENT AND PLAN:  Vitamin D deficiency - Plan: Vitamin D, Ergocalciferol, (DRISDOL) 1.25 MG (50000 UT) CAPS capsule  Other hyperlipidemia  Prediabetes - Plan: metFORMIN (GLUCOPHAGE) 500 MG tablet  At  risk for osteoporosis  Class 3 severe obesity with serious comorbidity and body mass index (BMI) of 45.0 to 49.9 in adult, unspecified obesity type ([place])  PLAN:  Vitamin D Deficiency Autumn Curry was informed that low vitamin D levels contributes to fatigue and are associated with obesity, breast, and colon cancer. Autumn Curry agrees to increase prescription Vit D @50 ,000 IU to twice weekly #10 with no refills and she will follow up for routine testing of vitamin D, at least 2-3 times per year. She was informed of the risk of over-replacement of vitamin D and agrees to not increase her dose unless she discusses this with [place] first. Autumn Curry agrees to follow up with our clinic in 2 weeks.  At risk for osteopenia and osteoporosis Autumn Curry was given extended  (15 minutes) osteoporosis prevention counseling today. Autumn Curry is at risk for osteopenia and osteoporosis due to her vitamin D deficiency. She was encouraged to take her vitamin D and follow her higher calcium diet and increase strengthening exercise to help strengthen her bones and decrease her risk of osteopenia and osteoporosis.  Hyperlipidemia Autumn Curry was informed of the American Heart Association Guidelines emphasizing intensive lifestyle modifications as the first line treatment for hyperlipidemia. We discussed many lifestyle modifications today in depth, and Autumn Curry will continue to work on decreasing saturated fats such as fatty red meat, butter and many fried foods. She will also increase vegetables and lean protein in her diet and continue to work on exercise and weight loss efforts.  Pre-Diabetes Autumn Curry will continue  to work on weight loss, exercise, and decreasing simple carbohydrates in her diet to help decrease the risk of diabetes. We dicussed metformin including benefits and risks. She was informed that eating too many simple carbohydrates or too many calories at one sitting increases the likelihood of GI side effects. Autumn Curry agrees to  change to metformin ER 500 mg once daily #30 with no refills and discontinue regular metformin. Autumn Curry agrees to follow up with [place] as directed to monitor her progress.  Obesity Autumn Curry is currently in the action stage of change. As such, her goal is to continue with weight loss efforts She has agreed to follow the Category 2 plan Autumn Curry has been instructed to work up to a goal of 150 minutes of combined cardio and strengthening exercise per week for weight loss and overall health benefits. We discussed the following Behavioral Modification Strategies today: planning for success and better snacking choices  Autumn Curry has agreed to follow up with our clinic in 2 weeks. She was informed of the importance of frequent follow up visits to maximize her success with intensive lifestyle modifications for her multiple health conditions.  ALLERGIES: No Known Allergies  MEDICATIONS: Current Outpatient Medications on File Prior to Visit  Medication Sig Dispense Refill   aspirin-acetaminophen-caffeine (EXCEDRIN MIGRAINE) 250-250-65 MG tablet Take by mouth every 6 (six) hours as needed for headache.     atenolol (TENORMIN) 25 MG tablet Take 1 tablet by mouth once daily 90 tablet 0   eletriptan (RELPAX) 20 MG tablet Take 1 tablet (20 mg total) by mouth as needed for migraine or headache. One tablet by mouth at onset of headache. May repeat in 2 hours if headache persists or recurs. 10 tablet 0   hydrochlorothiazide (HYDRODIURIL) 25 MG tablet TAKE 1 TABLET BY MOUTH ONCE DAILY **  MUST  HAVE  OFFICE  VISIT  BEFORE  FURTHER  REFILLS** 90 tablet 0   Ibuprofen 200 MG CAPS Take by mouth.     loratadine (CLARITIN) 10 MG tablet Take 10 mg by mouth daily.     Multiple Vitamins-Minerals (ALIVE WOMENS ENERGY) TABS Take 1 tablet by mouth daily.     valACYclovir (VALTREX) 500 MG tablet Take 1 tablet (500 mg total) by mouth 2 (two) times daily. 10 tablet 5   No current facility-administered medications on file  prior to visit.     PAST MEDICAL HISTORY: Past Medical History:  Diagnosis Date   Absence of menstruation    Carbuncle and furuncle of unspecified site    Elevated blood pressure reading without diagnosis of hypertension    Encounter for long-term (current) use of other medications    Headache(784.0)    HIV infection ([place]) 2008   dx AIDS   Hyperlipidemia    Hypertension    Routine general medical examination at a health care facility    Screening for malignant neoplasm of the cervix    Syphilis, unspecified     PAST SURGICAL HISTORY: History reviewed. No pertinent surgical history.  SOCIAL HISTORY: Social History   Tobacco Use   Smoking status: Never Smoker   Smokeless tobacco: Never Used  Substance Use Topics   Alcohol use: No    Alcohol/week: 0.0 standard drinks   Drug use: No    FAMILY HISTORY: Family History  Problem Relation Age of Onset   Hyperlipidemia Mother    Hypertension Mother    Depression Mother    Coronary artery disease Father    Diabetes Father    Heart disease Father  Hyperlipidemia Father    Stroke Father    Kidney disease Father     ROS: Review of Systems  Constitutional: Positive for malaise/fatigue. Negative for weight loss.  Cardiovascular: Negative for chest pain.  Gastrointestinal: Positive for diarrhea. Negative for blood in stool, melena, nausea and vomiting.  Musculoskeletal:       Negative for muscle weakness    PHYSICAL EXAM: Blood pressure 129/83, pulse 83, temperature 98.4 F (36.9 C), temperature source Oral, height 5\' 2"  (1.575 m), weight 251 lb (113.9 kg), SpO2 98 %. Body mass index is 45.91 kg/m. Physical Exam Vitals signs reviewed.  Constitutional:      Appearance: Normal appearance. She is well-developed. She is obese.  Cardiovascular:     Rate and Rhythm: Normal rate.  Pulmonary:     Effort: Pulmonary effort is normal.  Musculoskeletal: Normal range of motion.  Skin:    General:  Skin is warm and dry.  Neurological:     Mental Status: She is alert and oriented to person, place, and time.  Psychiatric:        Mood and Affect: Mood normal.        Behavior: Behavior normal.     RECENT LABS AND TESTS: BMET    Component Value Date/Time   NA 139 05/26/2019 1507   NA 138 02/22/2019 0853   K 3.8 05/26/2019 1507   CL 102 05/26/2019 1507   CO2 29 05/26/2019 1507   GLUCOSE 98 05/26/2019 1507   BUN 18 05/26/2019 1507   BUN 19 02/22/2019 0853   CREATININE 0.84 05/26/2019 1507   CREATININE 0.92 04/26/2019 1429   CALCIUM 9.4 05/26/2019 1507   GFRNONAA 78 04/26/2019 1429   GFRAA 91 04/26/2019 1429   Lab Results  Component Value Date   HGBA1C 5.6 05/26/2019   HGBA1C 5.7 (H) 02/22/2019   HGBA1C 5.6 09/13/2018   HGBA1C 5.7 (H) 04/22/2018   HGBA1C 5.7 (H) 12/14/2017   Lab Results  Component Value Date   INSULIN 60.9 (H) 02/22/2019   INSULIN 24.8 09/13/2018   INSULIN 28.7 (H) 04/22/2018   INSULIN 31.4 (H) 12/14/2017   CBC    Component Value Date/Time   WBC 6.3 05/26/2019 1507   RBC 4.07 05/26/2019 1507   HGB 12.1 05/26/2019 1507   HGB 12.5 12/14/2017 1131   HCT 36.3 05/26/2019 1507   HCT 39.3 12/14/2017 1131   PLT 343.0 05/26/2019 1507   MCV 89.1 05/26/2019 1507   MCV 92 12/14/2017 1131   MCH 29.3 04/26/2019 1429   MCHC 33.3 05/26/2019 1507   RDW 13.5 05/26/2019 1507   RDW 14.0 12/14/2017 1131   LYMPHSABS 3.1 05/26/2019 1507   LYMPHSABS 3.6 (H) 12/14/2017 1131   MONOABS 0.4 05/26/2019 1507   EOSABS 0.1 05/26/2019 1507   EOSABS 0.2 12/14/2017 1131   BASOSABS 0.1 05/26/2019 1507   BASOSABS 0.0 12/14/2017 1131   Iron/TIBC/Ferritin/ %Sat No results found for: IRON, TIBC, FERRITIN, IRONPCTSAT Lipid Panel     Component Value Date/Time   CHOL 219 (H) 05/26/2019 1507   CHOL 214 (H) 02/22/2019 0853   TRIG 194.0 (H) 05/26/2019 1507   HDL 37.00 (L) 05/26/2019 1507   HDL 41 02/22/2019 0853   CHOLHDL 6 05/26/2019 1507   VLDL 38.8 05/26/2019 1507    LDLCALC 144 (H) 05/26/2019 1507   LDLCALC 145 (H) 02/22/2019 0853   LDLCALC 152 (H) 11/20/2017 1647   Hepatic Function Panel     Component Value Date/Time   PROT 7.3 05/26/2019 1507  PROT 7.4 02/22/2019 0853   ALBUMIN 4.1 05/26/2019 1507   ALBUMIN 4.0 02/22/2019 0853   AST 15 05/26/2019 1507   ALT 13 05/26/2019 1507   ALKPHOS 73 05/26/2019 1507   BILITOT 0.3 05/26/2019 1507   BILITOT 0.2 02/22/2019 0853      Component Value Date/Time   TSH 1.89 05/26/2019 1507   TSH 1.710 12/14/2017 1131   TSH 1.420 05/25/2017 0921     Ref. Range 05/26/2019 15:07  VITD Latest Ref Range: 30.00 - 100.00 ng/mL 40.65    OBESITY BEHAVIORAL INTERVENTION VISIT  Today's visit was # 31  Starting weight: 253 lbs Starting date: 12/14/2017 Today's weight : 251 lbs Today's date: 06/14/2019 Total lbs lost to date: 2    06/14/2019  Height 5\' 2"  (1.575 m)  Weight 251 lb (113.9 kg)  BMI (Calculated) 45.9  BLOOD PRESSURE - SYSTOLIC [id number]  BLOOD PRESSURE - DIASTOLIC 83   Body Fat % 53 %  Total Body Water (lbs) 89.6 lbs    ASK: We discussed the diagnosis of obesity with Autumn Curry today and Autumn Curry agreed to give [place] permission to discuss obesity behavioral modification therapy today.  ASSESS: Autumn Curry has the diagnosis of obesity and her BMI today is 45.9 Autumn Curry is in the action stage of change   ADVISE: Autumn Curry was educated on the multiple health risks of obesity as well as the benefit of weight loss to improve her health. She was advised of the need for long term treatment and the importance of lifestyle modifications to improve her current health and to decrease her risk of future health problems.  AGREE: Multiple dietary modification options and treatment options were discussed and  Autumn Curry agreed to follow the recommendations documented in the above note.  ARRANGE: Autumn Curry was educated on the importance of frequent visits to treat obesity as outlined per CMS and USPSTF guidelines and  agreed to schedule her next follow up appointment today.  Corey Skains, am acting as transcriptionist for Abby Potash, PA-C I, Abby Potash, PA-C have reviewed above note and agree with its content

## 2019-06-30 ENCOUNTER — Other Ambulatory Visit: Payer: Self-pay

## 2019-06-30 ENCOUNTER — Ambulatory Visit (INDEPENDENT_AMBULATORY_CARE_PROVIDER_SITE_OTHER): Payer: Managed Care, Other (non HMO) | Admitting: Physician Assistant

## 2019-06-30 ENCOUNTER — Encounter (INDEPENDENT_AMBULATORY_CARE_PROVIDER_SITE_OTHER): Payer: Self-pay | Admitting: Physician Assistant

## 2019-06-30 VITALS — BP 126/83 | HR 90 | Temp 98.6°F | Ht 62.0 in | Wt 249.0 lb

## 2019-06-30 DIAGNOSIS — R7303 Prediabetes: Secondary | ICD-10-CM | POA: Diagnosis not present

## 2019-06-30 DIAGNOSIS — E7849 Other hyperlipidemia: Secondary | ICD-10-CM | POA: Diagnosis not present

## 2019-06-30 DIAGNOSIS — Z9189 Other specified personal risk factors, not elsewhere classified: Secondary | ICD-10-CM

## 2019-06-30 DIAGNOSIS — Z6841 Body Mass Index (BMI) 40.0 and over, adult: Secondary | ICD-10-CM

## 2019-06-30 MED ORDER — METFORMIN HCL 500 MG PO TABS: 500.0000 mg | ORAL_TABLET | Freq: Every day | ORAL | 0 refills | Status: DC

## 2019-07-05 NOTE — Progress Notes (Signed)
Office: [phone]  /  Fax: [phone]   HPI:  Chief Complaint: OBESITY Autumn Curry is here to discuss her progress with her obesity treatment plan. She is on the Category 2 plan and states she is following her eating plan approximately 90 % of the time. She states she is doing cardio exercise 15 to 30 minutes 4 times per week.  [name] states that she was able to get back on track and follow the plan better the last few weeks. She is not concerned about holiday food over Christmas.  Hyperlipidemia Autumn Curry has hyperlipidemia and she is not on medications. She has no chest pain. Autumn Curry is exercising four days a week.   Pre-Diabetes Autumn Curry has a diagnosis of prediabetes and she is on metformin. She has no nausea or vomiting, but she does have intermittent abdominal cramping and headache.    At risk for diabetes Autumn Curry is at higher than average risk for developing diabetes due to her obesity and prediabetes.    Today's visit was # 46 Starting weight: 253 lbs Starting date: 12/14/2017 Today's weight : 253 lbs Today's date: 06/30/2019 Total lbs lost to date: 14 Total lbs lost since last in-office visit: 2  ASSESSMENT AND PLAN:  Other hyperlipidemia  Prediabetes - Plan: metFORMIN (GLUCOPHAGE) 500 MG tablet  At risk for diabetes mellitus  Class 3 severe obesity with serious comorbidity and body mass index (BMI) of 45.0 to 49.9 in adult, unspecified obesity type ([place])  PLAN:  Hyperlipidemia Intensive lifestyle modifications as the first line treatment for hyperlipidemia. We discussed many lifestyle modifications today and Autumn Curry will continue to work on diet, exercise and weight loss efforts.  Pre-Diabetes Autumn Curry will continue to work on weight loss, exercise, and decreasing simple carbohydrates to help decrease the risk of diabetes. Autumn Curry agrees to continue metformin 500 mg daily #30 with no refills and follow up as directed.  Diabetes risk counseling (~15 min) Autumn Curry  is a 38 y.o. female and has risk factors for diabetes including obesity and prediabetes. We discussed intensive lifestyle modifications today with an emphasis on weight loss as well as increasing exercise and decreasing simple carbohydrates in her diet.  Obesity Autumn Curry is currently in the action stage of change. As such, her goal is to continue with weight loss efforts She has agreed to follow the Category 2 plan. Autumn Curry will increase exercise to 5 days per week with resistance training.  We discussed the following Behavioral Modification Strategies today: keeping healthy foods in the home and work on meal planning and easy cooking plans.  Autumn Curry has agreed to follow up with our clinic in 3 weeks. She was informed of the importance of frequent follow up visits to maximize her success with intensive lifestyle modifications for her multiple health conditions.  ALLERGIES: No Known Allergies  MEDICATIONS: Current Outpatient Medications on File Prior to Visit  Medication Sig Dispense Refill  . aspirin-acetaminophen-caffeine (EXCEDRIN MIGRAINE) 250-250-65 MG tablet Take by mouth every 6 (six) hours as needed for headache.    [name] atenolol (TENORMIN) 25 MG tablet Take 1 tablet by mouth once daily 90 tablet 0  . Darunavir-Cobicisctat-Emtricitabine-Tenofovir Alafenamide (SYMTUZA) 800-150-200-10 MG TABS Take 1 tablet by mouth daily with breakfast. 30 tablet 11  . eletriptan (RELPAX) 20 MG tablet Take 1 tablet (20 mg total) by mouth as needed for migraine or headache. One tablet by mouth at onset of headache. May repeat in 2 hours if headache persists or recurs. 10 tablet 0  . hydrochlorothiazide (HYDRODIURIL) 25 MG tablet TAKE  1 TABLET BY MOUTH ONCE DAILY **  MUST  HAVE  OFFICE  VISIT  BEFORE  FURTHER  REFILLS** 90 tablet 0  . Ibuprofen 200 MG CAPS Take by mouth.    . loratadine (CLARITIN) 10 MG tablet Take 10 mg by mouth daily.    . Multiple Vitamins-Minerals (ALIVE WOMENS ENERGY) TABS Take 1 tablet  by mouth daily.    . valACYclovir (VALTREX) 500 MG tablet Take 1 tablet (500 mg total) by mouth 2 (two) times daily. 10 tablet 5  . Vitamin D, Ergocalciferol, (DRISDOL) 1.25 MG (50000 UT) CAPS capsule Take 1 capsule (50,000 Units total) by mouth every 3 (three) days. 10 capsule 0   No current facility-administered medications on file prior to visit.    PAST MEDICAL HISTORY: Past Medical History:  Diagnosis Date  . Absence of menstruation   . Carbuncle and furuncle of unspecified site   . Elevated blood pressure reading without diagnosis of hypertension   . Encounter for long-term (current) use of other medications   . Headache(784.0)   . HIV infection ([place]) 2008   dx AIDS  . Hyperlipidemia   . Hypertension   . Routine general medical examination at a health care facility   . Screening for malignant neoplasm of the cervix   . Syphilis, unspecified     PAST SURGICAL HISTORY: History reviewed. No pertinent surgical history.  SOCIAL HISTORY: Social History   Tobacco Use  . Smoking status: Never Smoker  . Smokeless tobacco: Never Used  Substance Use Topics  . Alcohol use: No    Alcohol/week: 0.0 standard drinks  . Drug use: No    FAMILY HISTORY: Family History  Problem Relation Age of Onset  . Hyperlipidemia Mother   . Hypertension Mother   . Depression Mother   . Coronary artery disease Father   . Diabetes Father   . Heart disease Father   . Hyperlipidemia Father   . Stroke Father   . Kidney disease Father     ROS: Review of Systems  Constitutional: Positive for weight loss.  Cardiovascular: Negative for chest pain.  Gastrointestinal: Negative for nausea and vomiting.       Positive for abdominal cramping  Neurological: Positive for headaches.    PHYSICAL EXAM: Blood pressure 126/83, pulse 90, temperature 98.6 F (37 C), temperature source Oral, height 5\' 2"  (1.575 m), weight 249 lb (112.9 kg), SpO2 99 %. Body mass index is 45.54 kg/m. Physical  Exam Vitals reviewed.  Constitutional:      General: She is not in acute distress.    Appearance: Normal appearance. She is well-developed. She is obese.  Cardiovascular:     Rate and Rhythm: Normal rate.  Pulmonary:     Effort: Pulmonary effort is normal.  Musculoskeletal:        General: Normal range of motion.  Skin:    General: Skin is warm and dry.  Neurological:     Mental Status: She is alert and oriented to person, place, and time.  Psychiatric:        Mood and Affect: Mood normal.        Behavior: Behavior normal.     RECENT LABS AND TESTS: BMET    Component Value Date/Time   NA 139 05/26/2019 1507   NA 138 02/22/2019 0853   K 3.8 05/26/2019 1507   CL 102 05/26/2019 1507   CO2 29 05/26/2019 1507   GLUCOSE 98 05/26/2019 1507   BUN 18 05/26/2019 1507   BUN 19  02/22/2019 0853   CREATININE 0.84 05/26/2019 1507   CREATININE 0.92 04/26/2019 1429   CALCIUM 9.4 05/26/2019 1507   GFRNONAA 78 04/26/2019 1429   GFRAA 91 04/26/2019 1429   Lab Results  Component Value Date   HGBA1C 5.6 05/26/2019   HGBA1C 5.7 (H) 02/22/2019   HGBA1C 5.6 09/13/2018   HGBA1C 5.7 (H) 04/22/2018   HGBA1C 5.7 (H) 12/14/2017   Lab Results  Component Value Date   INSULIN 60.9 (H) 02/22/2019   INSULIN 24.8 09/13/2018   INSULIN 28.7 (H) 04/22/2018   INSULIN 31.4 (H) 12/14/2017   CBC    Component Value Date/Time   WBC 6.3 05/26/2019 1507   RBC 4.07 05/26/2019 1507   HGB 12.1 05/26/2019 1507   HGB 12.5 12/14/2017 1131   HCT 36.3 05/26/2019 1507   HCT 39.3 12/14/2017 1131   PLT 343.0 05/26/2019 1507   MCV 89.1 05/26/2019 1507   MCV 92 12/14/2017 1131   MCH 29.3 04/26/2019 1429   MCHC 33.3 05/26/2019 1507   RDW 13.5 05/26/2019 1507   RDW 14.0 12/14/2017 1131   LYMPHSABS 3.1 05/26/2019 1507   LYMPHSABS 3.6 (H) 12/14/2017 1131   MONOABS 0.4 05/26/2019 1507   EOSABS 0.1 05/26/2019 1507   EOSABS 0.2 12/14/2017 1131   BASOSABS 0.1 05/26/2019 1507   BASOSABS 0.0 12/14/2017 1131    Iron/TIBC/Ferritin/ %Sat No results found for: IRON, TIBC, FERRITIN, IRONPCTSAT Lipid Panel     Component Value Date/Time   CHOL 219 (H) 05/26/2019 1507   CHOL 214 (H) 02/22/2019 0853   TRIG 194.0 (H) 05/26/2019 1507   HDL 37.00 (L) 05/26/2019 1507   HDL 41 02/22/2019 0853   CHOLHDL 6 05/26/2019 1507   VLDL 38.8 05/26/2019 1507   LDLCALC 144 (H) 05/26/2019 1507   LDLCALC 145 (H) 02/22/2019 0853   LDLCALC 152 (H) 11/20/2017 1647   Hepatic Function Panel     Component Value Date/Time   PROT 7.3 05/26/2019 1507   PROT 7.4 02/22/2019 0853   ALBUMIN 4.1 05/26/2019 1507   ALBUMIN 4.0 02/22/2019 0853   AST 15 05/26/2019 1507   ALT 13 05/26/2019 1507   ALKPHOS 73 05/26/2019 1507   BILITOT 0.3 05/26/2019 1507   BILITOT 0.2 02/22/2019 0853      Component Value Date/Time   TSH 1.89 05/26/2019 1507   TSH 1.710 12/14/2017 1131   TSH 1.420 05/25/2017 0921     Ref. Range 05/26/2019 15:07  VITD Latest Ref Range: 30.00 - 100.00 ng/mL 40.65    I, Doreene Nest, am acting as Location manager for Abby Potash, PA-C I, Abby Potash, PA-C have reviewed above note and agree with its content

## 2019-07-25 ENCOUNTER — Ambulatory Visit (INDEPENDENT_AMBULATORY_CARE_PROVIDER_SITE_OTHER): Payer: Managed Care, Other (non HMO) | Admitting: Physician Assistant

## 2019-07-26 ENCOUNTER — Encounter (INDEPENDENT_AMBULATORY_CARE_PROVIDER_SITE_OTHER): Payer: Self-pay | Admitting: Physician Assistant

## 2019-07-26 ENCOUNTER — Ambulatory Visit (INDEPENDENT_AMBULATORY_CARE_PROVIDER_SITE_OTHER): Payer: BC Managed Care – PPO | Admitting: Physician Assistant

## 2019-07-26 ENCOUNTER — Other Ambulatory Visit: Payer: Self-pay

## 2019-07-26 VITALS — BP 128/90 | HR 87 | Temp 98.7°F | Ht 62.0 in | Wt 253.0 lb

## 2019-07-26 DIAGNOSIS — Z6841 Body Mass Index (BMI) 40.0 and over, adult: Secondary | ICD-10-CM

## 2019-07-26 DIAGNOSIS — E559 Vitamin D deficiency, unspecified: Secondary | ICD-10-CM

## 2019-07-26 DIAGNOSIS — Z9189 Other specified personal risk factors, not elsewhere classified: Secondary | ICD-10-CM | POA: Diagnosis not present

## 2019-07-26 DIAGNOSIS — R7303 Prediabetes: Secondary | ICD-10-CM

## 2019-07-26 MED ORDER — VITAMIN D (ERGOCALCIFEROL) 1.25 MG (50000 UNIT) PO CAPS: 50000.0000 [IU] | ORAL_CAPSULE | ORAL | 0 refills | Status: DC

## 2019-07-28 NOTE — Progress Notes (Signed)
Chief Complaint:   OBESITY Autumn Curry is here to discuss her progress with her obesity treatment plan along with follow-up of her obesity related diagnoses. Autumn Curry is on the Category 2 Plan and states she is following her eating plan approximately 85 to 90% of the time. Autumn Curry states she is exercising 0 minutes 0 times per week.  Today's visit was #: 23 Starting weight: 253 lbs Starting date: 12/14/2017 Today's weight: 253 lbs Today's date: 07/26/2019 Total lbs lost to date: 0 Total lbs lost since last in-office visit: 0  Interim History: Autumn Curry reports that she thought she followed the plan well, although she sometimes under-eats her protein at breakfast and dinner.  Subjective:   Vitamin D deficiency  Autumn Curry is on vitamin D twice weekly. Her last vitamin D level was 40.65 (05/26/19). She has no nausea, vomiting or muscle weakness.  Prediabetes Autumn Curry is on metformin. She has no nausea, vomiting or diarrhea. Her last A1c was 5.6 (05/26/19). Autumn Curry has no polyphagia.  At risk for diabetes mellitus Autumn Curry is at higher than average risk for developing diabetes due to her obesity and prediabetes.   Assessment/Plan:   Vitamin D deficiency  Low Vitamin D level contributes to fatigue and are associated with obesity, breast, and colon cancer. Autumn Curry agrees to continue to take prescription Vitamin D @50 ,000 IU every 3 days #10 with no refills and she will follow-up for routine testing of vitamin D, at least 2-3 times per year to avoid over-replacement.  Prediabetes Autumn Curry will continue with metformin and she will continue to work on weight loss, exercise, and decreasing simple carbohydrates to help decrease the risk of diabetes.   At risk for diabetes mellitus Autumn Curry was given approximately 15 minutes of diabetes education and counseling today. We discussed intensive lifestyle modifications today with an emphasis on weight loss as well as increasing exercise and  decreasing simple carbohydrates in her diet. We also reviewed medication options with an emphasis on risk versus benefit of those discussed.   Obesity Autumn Curry is currently in the action stage of change. As such, her goal is to continue with weight loss efforts. She has agreed to follow the [place].   We discussed the following exercise goals today: For substantial health benefits, adults should do at least 150 minutes (2 hours and 30 minutes) a week of moderate-intensity, or 75 minutes (1 hour and 15 minutes) a week of vigorous-intensity aerobic physical activity, or an equivalent combination of moderate- and vigorous-intensity aerobic activity. Aerobic activity should be performed in episodes of at least 10 minutes, and preferably, it should be spread throughout the week. Adults should also include muscle-strengthening activities that involve all major muscle groups on 2 or more days a week.  We discussed the following behavioral modification strategies today: increasing lean protein intake and meal planning and cooking strategies.  Autumn Curry has agreed to follow-up with our clinic in 2 weeks. She was informed of the importance of frequent follow-up visits to maximize her success with intensive lifestyle modifications for her multiple health conditions.   Objective:   Blood pressure 128/90, pulse 87, temperature 98.7 F (37.1 C), temperature source Oral, height 5\' 2"  (1.575 m), weight 253 lb (114.8 kg), SpO2 97 %. Body mass index is 46.27 kg/m.  General: Cooperative, alert, well developed, in no acute distress. HEENT: Conjunctivae and lids unremarkable. Neck: No thyromegaly.  Cardiovascular: Regular rhythm.  Lungs: Normal work of breathing. Extremities: No edema.  Neurologic: No focal deficits.  Lab Results  Component Value Date   CREATININE 0.84 05/26/2019   BUN 18 05/26/2019   NA 139 05/26/2019   K 3.8 05/26/2019   CL 102 05/26/2019   CO2 29 05/26/2019   Lab  Results  Component Value Date   ALT 13 05/26/2019   AST 15 05/26/2019   ALKPHOS 73 05/26/2019   BILITOT 0.3 05/26/2019   Lab Results  Component Value Date   HGBA1C 5.6 05/26/2019   HGBA1C 5.7 (H) 02/22/2019   HGBA1C 5.6 09/13/2018   HGBA1C 5.7 (H) 04/22/2018   HGBA1C 5.7 (H) 12/14/2017   Lab Results  Component Value Date   INSULIN 60.9 (H) 02/22/2019   INSULIN 24.8 09/13/2018   INSULIN 28.7 (H) 04/22/2018   INSULIN 31.4 (H) 12/14/2017   Lab Results  Component Value Date   TSH 1.89 05/26/2019   Lab Results  Component Value Date   CHOL 219 (H) 05/26/2019   HDL 37.00 (L) 05/26/2019   LDLCALC 144 (H) 05/26/2019   TRIG 194.0 (H) 05/26/2019   CHOLHDL 6 05/26/2019   Lab Results  Component Value Date   WBC 6.3 05/26/2019   HGB 12.1 05/26/2019   HCT 36.3 05/26/2019   MCV 89.1 05/26/2019   PLT 343.0 05/26/2019   No results found for: IRON, TIBC, FERRITIN   Ref. Range 05/26/2019 15:07  VITD Latest Ref Range: 30.00 - 100.00 ng/mL 40.65     Current Outpatient Medications  Medication Sig Dispense Refill  . aspirin-acetaminophen-caffeine (EXCEDRIN MIGRAINE) 250-250-65 MG tablet Take by mouth every 6 (six) hours as needed for headache.    [name] atenolol (TENORMIN) 25 MG tablet Take 1 tablet by mouth once daily 90 tablet 0  . Darunavir-Cobicisctat-Emtricitabine-Tenofovir Alafenamide (SYMTUZA) 800-150-200-10 MG TABS Take 1 tablet by mouth daily with breakfast. 30 tablet 11  . eletriptan (RELPAX) 20 MG tablet Take 1 tablet (20 mg total) by mouth as needed for migraine or headache. One tablet by mouth at onset of headache. May repeat in 2 hours if headache persists or recurs. 10 tablet 0  . hydrochlorothiazide (HYDRODIURIL) 25 MG tablet TAKE 1 TABLET BY MOUTH ONCE DAILY **  MUST  HAVE  OFFICE  VISIT  BEFORE  FURTHER  REFILLS** 90 tablet 0  . Ibuprofen 200 MG CAPS Take by mouth.    . loratadine (CLARITIN) 10 MG tablet Take 10 mg by mouth daily.    . metFORMIN (GLUCOPHAGE) 500 MG  tablet Take 1 tablet (500 mg total) by mouth daily with breakfast. 30 tablet 0  . Multiple Vitamins-Minerals (ALIVE WOMENS ENERGY) TABS Take 1 tablet by mouth daily.    . valACYclovir (VALTREX) 500 MG tablet Take 1 tablet (500 mg total) by mouth 2 (two) times daily. 10 tablet 5  . Vitamin D, Ergocalciferol, (DRISDOL) 1.25 MG (50000 UNIT) CAPS capsule Take 1 capsule (50,000 Units total) by mouth every 3 (three) days. 10 capsule 0   No current facility-administered medications for this visit.   Attestation Statements:   Reviewed by clinician on day of visit: allergies, medications, problem list, medical history, surgical history, family history, social history, and previous encounter notes.  Corey Skains, am acting as Location manager for [employer], PA-C.  I have reviewed the above documentation for accuracy and completeness, and I agree with the above. Abby Potash, PA-C

## 2019-08-02 ENCOUNTER — Other Ambulatory Visit: Payer: Self-pay | Admitting: Family Medicine

## 2019-08-02 DIAGNOSIS — I10 Essential (primary) hypertension: Secondary | ICD-10-CM

## 2019-08-02 MED ORDER — ATENOLOL 25 MG PO TABS: 25.0000 mg | ORAL_TABLET | Freq: Every day | ORAL | 1 refills | Status: DC

## 2019-08-02 MED ORDER — HYDROCHLOROTHIAZIDE 25 MG PO TABS: ORAL_TABLET | ORAL | 1 refills | Status: DC

## 2019-08-09 ENCOUNTER — Encounter (INDEPENDENT_AMBULATORY_CARE_PROVIDER_SITE_OTHER): Payer: Self-pay | Admitting: Physician Assistant

## 2019-08-09 ENCOUNTER — Ambulatory Visit (INDEPENDENT_AMBULATORY_CARE_PROVIDER_SITE_OTHER): Payer: BC Managed Care – PPO | Admitting: Physician Assistant

## 2019-08-09 ENCOUNTER — Other Ambulatory Visit: Payer: Self-pay

## 2019-08-09 VITALS — BP 119/82 | HR 76 | Temp 98.4°F | Ht 62.0 in | Wt 251.0 lb

## 2019-08-09 DIAGNOSIS — Z6841 Body Mass Index (BMI) 40.0 and over, adult: Secondary | ICD-10-CM | POA: Diagnosis not present

## 2019-08-09 DIAGNOSIS — R7303 Prediabetes: Secondary | ICD-10-CM

## 2019-08-10 NOTE — Progress Notes (Signed)
Chief Complaint:   OBESITY Autumn Curry is here to discuss her progress with her obesity treatment plan along with follow-up of her obesity related diagnoses. Autumn Curry is on the Category 2 Plan and states she is following her eating plan approximately 90% of the time. Autumn Curry states she walked at home for 30 minutes 3 times per week.  Today's visit was #: 44 Starting weight: 253 lbs Starting date: 12/14/17 Today's weight: 251 lbs Today's date: 08/09/2019 Total lbs lost to date: 2 Total lbs lost since last in-office visit: 2  Interim History: Autumn Curry reports eating more on the plan the last 2 weeks. She has been eating breakfast and eating 200 snack calories. She is eating yogurt for snacks at times.  Subjective:   1. Pre-diabetes Autumn Curry's last A1c was 5.6. she is on metformin once daily, and denies nausea, vomiting, or diarrhea.  Assessment/Plan:   1. Pre-diabetes Autumn Curry agreed to continue her medications, and she will continue to work on weight loss, exercise, and decreasing simple carbohydrates to help decrease the risk of diabetes.   2. Class 3 severe obesity with serious comorbidity and body mass index (BMI) of 45.0 to 49.9 in adult, unspecified obesity type ([facility]) Autumn Curry is currently in the action stage of change. As such, her goal is to continue with weight loss efforts. She has agreed to the Category 2 Plan.   Exercise goals: No exercise has been prescribed at this time.  Behavioral modification strategies: meal planning and cooking strategies and planning for success.  Autumn Curry has agreed to follow-up with our clinic in 3 weeks. She was informed of the importance of frequent follow-up visits to maximize her success with intensive lifestyle modifications for her multiple health conditions.   Objective:   Blood pressure 119/82, pulse 76, temperature 98.4 F (36.9 C), temperature source Oral, height 5\' 2"  (1.575 m), weight 251 lb (113.9 kg), SpO2 99 %. Body mass index is  45.91 kg/m.  General: Cooperative, alert, well developed, in no acute distress. HEENT: Conjunctivae and lids unremarkable. Cardiovascular: Regular rhythm.  Lungs: Normal work of breathing. Neurologic: No focal deficits.   Lab Results  Component Value Date   CREATININE 0.84 05/26/2019   BUN 18 05/26/2019   NA 139 05/26/2019   K 3.8 05/26/2019   CL 102 05/26/2019   CO2 29 05/26/2019   Lab Results  Component Value Date   ALT 13 05/26/2019   AST 15 05/26/2019   ALKPHOS 73 05/26/2019   BILITOT 0.3 05/26/2019   Lab Results  Component Value Date   HGBA1C 5.6 05/26/2019   HGBA1C 5.7 (H) 02/22/2019   HGBA1C 5.6 09/13/2018   HGBA1C 5.7 (H) 04/22/2018   HGBA1C 5.7 (H) 12/14/2017   Lab Results  Component Value Date   INSULIN 60.9 (H) 02/22/2019   INSULIN 24.8 09/13/2018   INSULIN 28.7 (H) 04/22/2018   INSULIN 31.4 (H) 12/14/2017   Lab Results  Component Value Date   TSH 1.89 05/26/2019   Lab Results  Component Value Date   CHOL 219 (H) 05/26/2019   HDL 37.00 (L) 05/26/2019   LDLCALC 144 (H) 05/26/2019   TRIG 194.0 (H) 05/26/2019   CHOLHDL 6 05/26/2019   Lab Results  Component Value Date   WBC 6.3 05/26/2019   HGB 12.1 05/26/2019   HCT 36.3 05/26/2019   MCV 89.1 05/26/2019   PLT 343.0 05/26/2019   No results found for: IRON, TIBC, FERRITIN  Attestation Statements:   Reviewed by clinician on day of visit: allergies,  medications, problem list, medical history, surgical history, family history, social history, and previous encounter notes.  Time spent on visit including pre-visit chart review and post-visit care was 24 minutes.   Wilhemena Durie, am acting as transcriptionist for [employer], PA-C.  I have reviewed the above documentation for accuracy and completeness, and I agree with the above. Abby Potash, PA-C

## 2019-08-29 ENCOUNTER — Other Ambulatory Visit: Payer: Self-pay

## 2019-08-29 ENCOUNTER — Ambulatory Visit (INDEPENDENT_AMBULATORY_CARE_PROVIDER_SITE_OTHER): Payer: BC Managed Care – PPO | Admitting: Family Medicine

## 2019-08-29 VITALS — BP 118/73 | HR 68 | Temp 98.3°F | Ht 62.0 in | Wt 250.0 lb

## 2019-08-29 DIAGNOSIS — R7303 Prediabetes: Secondary | ICD-10-CM

## 2019-08-29 DIAGNOSIS — E559 Vitamin D deficiency, unspecified: Secondary | ICD-10-CM | POA: Diagnosis not present

## 2019-08-29 DIAGNOSIS — Z9189 Other specified personal risk factors, not elsewhere classified: Secondary | ICD-10-CM | POA: Diagnosis not present

## 2019-08-29 DIAGNOSIS — Z6841 Body Mass Index (BMI) 40.0 and over, adult: Secondary | ICD-10-CM

## 2019-08-30 MED ORDER — METFORMIN HCL 500 MG PO TABS: 500.0000 mg | ORAL_TABLET | Freq: Every day | ORAL | 0 refills | Status: DC

## 2019-08-30 MED ORDER — VITAMIN D (ERGOCALCIFEROL) 1.25 MG (50000 UNIT) PO CAPS: 50000.0000 [IU] | ORAL_CAPSULE | ORAL | 0 refills | Status: DC

## 2019-08-30 NOTE — Progress Notes (Signed)
Chief Complaint:   OBESITY Autumn Curry is here to discuss her progress with her obesity treatment plan along with follow-up of her obesity related diagnoses. Autumn Curry is on the Category 2 Plan and states she is following her eating plan approximately 90% of the time. Autumn Curry states she is doing cardio exercise 15 to 30 minutes 3 times per week.  Today's visit was #: 15 Starting weight: 253 lbs Starting date: 12/14/2017 Today's weight: 250 lbs Today's date: 08/29/2019 Total lbs lost to date: 3 Total lbs lost since last in-office visit: 1  Interim History: Autumn Curry returns to the clinic 08/09/19. She reports 90% adherence to the plan. The first two weeks patient was on the plan, but last week she chose more nutritionally valid options ordering out on her husband's week ordering out.  Subjective:   Vitamin D deficiency  Autumn Curry's Vitamin D level was 40.65 on 05/26/19. She is currently taking vit D. She admits fatigue and denies nausea, vomiting or muscle weakness. Labs were discussed with patient today.  Prediabetes Autumn Curry has a diagnosis of prediabetes and she may have gotten regular metformin, not extended release. Her last insulin level was over 60 (62.9 on 05/26/19). Her last Hgb A1c was at 5.6 (05/26/19). She had significant side effect on regular metformin. She continues to work on diet and exercise to decrease her risk of diabetes. Labs were discussed with patient today.  Lab Results  Component Value Date   HGBA1C 5.6 05/26/2019   Lab Results  Component Value Date   INSULIN 60.9 (H) 02/22/2019   INSULIN 24.8 09/13/2018   INSULIN 28.7 (H) 04/22/2018   INSULIN 31.4 (H) 12/14/2017   At risk for medication nonadherence Autumn Curry is at a higher than average risk for medication non-adherence due to GI side effects of metformin.  Assessment/Plan:   Vitamin D deficiency  Low Vitamin D level contributes to fatigue and are associated with obesity, breast, and colon cancer.  Autumn Curry agrees to continue to take prescription Vitamin D @50 ,000 IU every week #4 with no refills and she will follow-up for routine testing of Vitamin D, at least 2-3 times per year to avoid over-replacement.  Prediabetes Autumn Curry agrees to take metformin ER 500 mg PO qAM #30 with no refills. She will continue to work on weight loss, exercise, and decreasing simple carbohydrates to help decrease the risk of diabetes.   At risk for medication nonadherence Autumn Curry was given approximately 15 minutes of counseling today to help her avoid medication non-adherence.  We discussed importance of taking medications at a similar time each day and the use of daily pill organizers to help improve medication adherence.  Repetitive spaced learning was employed today to elicit superior memory formation and behavioral change.  Class 3 severe obesity with serious comorbidity and body mass index (BMI) of 45.0 to 49.9 in adult, unspecified obesity type (HCC) Autumn Curry is currently in the action stage of change. As such, her goal is to continue with weight loss efforts. She has agreed to the Category 2 Plan.   Exercise goals: Autumn Curry will continue doing cardio exercise for 15 to 30 minutes, 3 times per week.  Behavioral modification strategies: increasing lean protein intake, increasing water intake, meal planning and cooking strategies, keeping healthy foods in the home and planning for success.  Autumn Curry has agreed to follow-up with our clinic in 2 weeks. She was informed of the importance of frequent follow-up visits to maximize her success with intensive lifestyle modifications for her multiple health  conditions.   Objective:   Blood pressure 118/73, pulse 68, temperature 98.3 F (36.8 C), temperature source Oral, height 5\' 2"  (1.575 m), weight 250 lb (113.4 kg), last menstrual period 08/14/2019, SpO2 98 %. Body mass index is 45.73 kg/m.  General: Cooperative, alert, well developed, in no acute  distress. HEENT: Conjunctivae and lids unremarkable. Cardiovascular: Regular rhythm.  Lungs: Normal work of breathing. Neurologic: No focal deficits.   Lab Results  Component Value Date   CREATININE 0.84 05/26/2019   BUN 18 05/26/2019   NA 139 05/26/2019   K 3.8 05/26/2019   CL 102 05/26/2019   CO2 29 05/26/2019   Lab Results  Component Value Date   ALT 13 05/26/2019   AST 15 05/26/2019   ALKPHOS 73 05/26/2019   BILITOT 0.3 05/26/2019   Lab Results  Component Value Date   HGBA1C 5.6 05/26/2019   HGBA1C 5.7 (H) 02/22/2019   HGBA1C 5.6 09/13/2018   HGBA1C 5.7 (H) 04/22/2018   HGBA1C 5.7 (H) 12/14/2017   Lab Results  Component Value Date   INSULIN 60.9 (H) 02/22/2019   INSULIN 24.8 09/13/2018   INSULIN 28.7 (H) 04/22/2018   INSULIN 31.4 (H) 12/14/2017   Lab Results  Component Value Date   TSH 1.89 05/26/2019   Lab Results  Component Value Date   CHOL 219 (H) 05/26/2019   HDL 37.00 (L) 05/26/2019   LDLCALC 144 (H) 05/26/2019   TRIG 194.0 (H) 05/26/2019   CHOLHDL 6 05/26/2019   Lab Results  Component Value Date   WBC 6.3 05/26/2019   HGB 12.1 05/26/2019   HCT 36.3 05/26/2019   MCV 89.1 05/26/2019   PLT 343.0 05/26/2019   No results found for: IRON, TIBC, FERRITIN   Ref. Range 05/26/2019 15:07  VITD Latest Ref Range: 30.00 - 100.00 ng/mL 40.65    Attestation Statements:   Reviewed by clinician on day of visit: allergies, medications, problem list, medical history, surgical history, family history, social history, and previous encounter notes.  I, Doreene Nest, am acting as transcriptionist for Eber Jones, MD.  I have reviewed the above documentation for accuracy and completeness, and I agree with the above. - Ilene Qua, MD

## 2019-09-14 ENCOUNTER — Encounter (INDEPENDENT_AMBULATORY_CARE_PROVIDER_SITE_OTHER): Payer: Self-pay | Admitting: Family Medicine

## 2019-09-14 ENCOUNTER — Other Ambulatory Visit: Payer: Self-pay

## 2019-09-14 ENCOUNTER — Ambulatory Visit (INDEPENDENT_AMBULATORY_CARE_PROVIDER_SITE_OTHER): Payer: BC Managed Care – PPO | Admitting: Family Medicine

## 2019-09-14 VITALS — BP 115/80 | HR 71 | Temp 98.5°F | Ht 62.0 in | Wt 251.0 lb

## 2019-09-14 DIAGNOSIS — R7303 Prediabetes: Secondary | ICD-10-CM | POA: Diagnosis not present

## 2019-09-14 DIAGNOSIS — E559 Vitamin D deficiency, unspecified: Secondary | ICD-10-CM | POA: Diagnosis not present

## 2019-09-14 DIAGNOSIS — Z6841 Body Mass Index (BMI) 40.0 and over, adult: Secondary | ICD-10-CM

## 2019-09-14 MED ORDER — VITAMIN D (ERGOCALCIFEROL) 1.25 MG (50000 UNIT) PO CAPS: 50000.0000 [IU] | ORAL_CAPSULE | ORAL | 0 refills | Status: DC

## 2019-09-15 ENCOUNTER — Encounter (INDEPENDENT_AMBULATORY_CARE_PROVIDER_SITE_OTHER): Payer: Self-pay | Admitting: Family Medicine

## 2019-09-15 DIAGNOSIS — E559 Vitamin D deficiency, unspecified: Secondary | ICD-10-CM | POA: Insufficient documentation

## 2019-09-15 DIAGNOSIS — R7303 Prediabetes: Secondary | ICD-10-CM | POA: Insufficient documentation

## 2019-09-15 NOTE — Progress Notes (Signed)
Chief Complaint:   OBESITY Autumn Curry is here to discuss her progress with her obesity treatment plan along with follow-up of her obesity related diagnoses. Autumn Curry is on the Category 2 Plan and states she is following her eating plan approximately 75% of the time. Autumn Curry states she is exercising 0 minutes 0 times per week.  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 She does tend to eat out on the weekends. She also deviates from the plan at dinner adding carbs such as rice.  Subjective:   Prediabetes. Autumn Curry has a diagnosis of prediabetes based on her elevated HgA1c and was informed this puts her at greater risk of developing diabetes. She continues to work on diet and exercise to decrease her risk of diabetes. She denies nausea or hypoglycemia. Autumn Curry is on metformin ER and it causes diarrhea sometimes. She reports polyphagia.  Lab Results  Component Value Date   HGBA1C 5.6 05/26/2019   Lab Results  Component Value Date   INSULIN 60.9 (H) 02/22/2019   INSULIN 24.8 09/13/2018   INSULIN 28.7 (H) 04/22/2018   INSULIN 31.4 (H) 12/14/2017   Vitamin D deficiency. Last Vitamin D level low at 40.65 on 05/26/2019. Autumn Curry is on weekly prescription Vitamin D.  Assessment/Plan:   Prediabetes. Autumn Curry will continue to work on weight loss, exercise, and decreasing simple carbohydrates to help decrease the risk of diabetes. We discussed both Contrave and Saxenda and she declined both.  Vitamin D deficiency. Low Vitamin D level contributes to fatigue and are associated with obesity, breast, and colon cancer. She was given a refill on her Vitamin D, Ergocalciferol, (DRISDOL) 1.25 MG (50000 UNIT) CAPS capsule every week #4 witih 0  refills and will follow-up for routine testing of Vitamin D in 1 month.   Class 3 severe obesity with serious comorbidity and body mass index (BMI) of 45.0 to 49.9 in adult, unspecified obesity type ([place]).  Autumn Curry is currently in the action stage of change. As such, her goal is to continue with weight loss efforts. She has agreed to the Category 2 Plan.   She will pack her lunch for work.  Exercise goals: Autumn Curry will walk 30 minutes 2 days per week.  Behavioral modification strategies: increasing lean protein intake, decreasing simple carbohydrates, meal planning and cooking strategies and planning for success.  Autumn Curry has agreed to follow-up with our clinic in 3 weeks. She was informed of the importance of frequent follow-up visits to maximize her success with intensive lifestyle modifications for her multiple health conditions.   Objective:   Blood pressure 115/80, pulse 71, temperature 98.5 F (36.9 C), temperature source Oral, height 5\' 2"  (1.575 m), weight 251 lb (113.9 kg), last menstrual period 09/08/2019, SpO2 96 %. Body mass index is 45.91 kg/m.  General: Cooperative, alert, well developed, in no acute distress. HEENT: Conjunctivae and lids unremarkable. Cardiovascular: Regular rhythm.  Lungs: Normal work of breathing. Neurologic: No focal deficits.   Lab Results  Component Value Date   CREATININE 0.84 05/26/2019   BUN 18 05/26/2019   NA 139 05/26/2019   K 3.8 05/26/2019   CL 102 05/26/2019   CO2 29 05/26/2019   Lab Results  Component Value Date   ALT 13 05/26/2019  AST 15 05/26/2019   ALKPHOS 73 05/26/2019   BILITOT 0.3 05/26/2019   Lab Results  Component Value Date   HGBA1C 5.6 05/26/2019   HGBA1C 5.7 (H) 02/22/2019   HGBA1C 5.6 09/13/2018   HGBA1C 5.7 (H) 04/22/2018   HGBA1C 5.7 (H) 12/14/2017   Lab Results  Component Value Date   INSULIN 60.9 (H) 02/22/2019   INSULIN 24.8 09/13/2018   INSULIN 28.7 (H) 04/22/2018   INSULIN 31.4 (H) 12/14/2017    Lab Results  Component Value Date   TSH 1.89 05/26/2019   Lab Results  Component Value Date   CHOL 219 (H) 05/26/2019   HDL 37.00 (L) 05/26/2019   LDLCALC 144 (H) 05/26/2019   TRIG 194.0 (H) 05/26/2019   CHOLHDL 6 05/26/2019   Lab Results  Component Value Date   WBC 6.3 05/26/2019   HGB 12.1 05/26/2019   HCT 36.3 05/26/2019   MCV 89.1 05/26/2019   PLT 343.0 05/26/2019   No results found for: IRON, TIBC, FERRITIN  Attestation Statements:   Reviewed by clinician on day of visit: allergies, medications, problem list, medical history, surgical history, family history, social history, and previous encounter notes.  IMichaelene Song, am acting as Location manager for [employer], FNP   I have reviewed the above documentation for accuracy and completeness, and I agree with the above. -  Georgianne Fick, FNP

## 2019-09-21 ENCOUNTER — Encounter: Payer: Self-pay | Admitting: Family Medicine

## 2019-09-21 ENCOUNTER — Encounter: Payer: Self-pay | Admitting: Gastroenterology

## 2019-09-21 ENCOUNTER — Ambulatory Visit (INDEPENDENT_AMBULATORY_CARE_PROVIDER_SITE_OTHER): Payer: BC Managed Care – PPO | Admitting: Family Medicine

## 2019-09-21 ENCOUNTER — Other Ambulatory Visit: Payer: Self-pay

## 2019-09-21 VITALS — Ht 62.0 in

## 2019-09-21 DIAGNOSIS — R195 Other fecal abnormalities: Secondary | ICD-10-CM | POA: Diagnosis not present

## 2019-09-21 DIAGNOSIS — R198 Other specified symptoms and signs involving the digestive system and abdomen: Secondary | ICD-10-CM | POA: Diagnosis not present

## 2019-09-21 NOTE — Progress Notes (Signed)
Virtual Visit via Video Note  I connected with Autumn Curry on 09/21/19 at  8:00 AM EST by a video enabled telemedicine application and verified that I am speaking with the correct person using two identifiers.  Location: Patient: home alone  Provider: home    I discussed the limitations of evaluation and management by telemedicine and the availability of in person appointments. The patient expressed understanding and agreed to proceed.  History of Present Illness: Pt is home c/o diarrhea last week --- she stopped the metformin and that resolved for the most part.  She has had trouble with alt constipation / diarrhea for months and has also had hemorrhoids and blood in the stool  Observations/Objective: There were no vitals filed for this visit. Pt is in nad   Assessment and Plan: 1. Heme positive stool prob from hemorrhoids  Inc fiber in diet , drink 8-10 glasses water a day  - Ambulatory referral to Gastroenterology  2. Alternating constipation and diarrhea Inc fiber in diet  Probiotic  Refer to GI Call if symptoms change or worsen - Ambulatory referral to Gastroenterology  Follow Up Instructions:    I discussed the assessment and treatment plan with the patient. The patient was provided an opportunity to ask questions and all were answered. The patient agreed with the plan and demonstrated an understanding of the instructions.   The patient was advised to call back or seek an in-person evaluation if the symptoms worsen or if the condition fails to improve as anticipated.  I provided 30 minutes of non-face-to-face time during this encounter.   Ann Held, DO

## 2019-10-10 ENCOUNTER — Encounter (INDEPENDENT_AMBULATORY_CARE_PROVIDER_SITE_OTHER): Payer: Self-pay | Admitting: Physician Assistant

## 2019-10-10 ENCOUNTER — Ambulatory Visit (INDEPENDENT_AMBULATORY_CARE_PROVIDER_SITE_OTHER): Payer: BC Managed Care – PPO | Admitting: Physician Assistant

## 2019-10-10 ENCOUNTER — Other Ambulatory Visit: Payer: Self-pay

## 2019-10-10 VITALS — BP 122/86 | HR 73 | Temp 98.0°F | Ht 62.0 in | Wt 252.0 lb

## 2019-10-10 DIAGNOSIS — R7303 Prediabetes: Secondary | ICD-10-CM | POA: Diagnosis not present

## 2019-10-10 DIAGNOSIS — Z9189 Other specified personal risk factors, not elsewhere classified: Secondary | ICD-10-CM | POA: Diagnosis not present

## 2019-10-10 DIAGNOSIS — E559 Vitamin D deficiency, unspecified: Secondary | ICD-10-CM

## 2019-10-10 DIAGNOSIS — E7849 Other hyperlipidemia: Secondary | ICD-10-CM

## 2019-10-10 DIAGNOSIS — Z6841 Body Mass Index (BMI) 40.0 and over, adult: Secondary | ICD-10-CM

## 2019-10-10 NOTE — Progress Notes (Signed)
Chief Complaint:   OBESITY SHAKERRA Curry is here to discuss her progress with her obesity treatment plan along with follow-up of her obesity related diagnoses. Autumn Curry is on the Category 2 Plan and states she is following her eating plan approximately 80% of the time. Autumn Curry states she is walking 15-20 minutes 2-3 times per week.  Today's visit was #: 91 Starting weight: 253 lbs Starting date: 12/14/2017 Today's weight: 252 lbs Today's date: 10/10/2019 Total lbs lost to date: 1 Total lbs lost since last in-office visit: 0  Interim History: Autumn Curry states that she has been eating out more often. She reports that metformin has been causing her to have "accidents" on herself.  Subjective:   Prediabetes. Autumn Curry has a diagnosis of prediabetes based on her elevated HgA1c and was informed this puts her at greater risk of developing diabetes. She continues to work on diet and exercise to decrease her risk of diabetes. She denies nausea or hypoglycemia. Autumn Curry states she stopped metformin 2 weeks ago due to diarrhea.  Lab Results  Component Value Date   HGBA1C 5.6 05/26/2019   Lab Results  Component Value Date   INSULIN 60.9 (H) 02/22/2019   INSULIN 24.8 09/13/2018   INSULIN 28.7 (H) 04/22/2018   INSULIN 31.4 (H) 12/14/2017   Vitamin D deficiency. Autumn Curry is on Vitamin D weekly. No nausea, vomiting, or muscle weakness. Last Vitamin D 40.65 on 05/26/2019. She is due for labs.  Other hyperlipidemia. Autumn Curry has hyperlipidemia and has been trying to improve her cholesterol levels with intensive lifestyle modification including a low saturated fat diet, exercise and weight loss. She denies any chest pain, claudication or myalgias. Last level was not at goal. She is on no medications. She exercises 2-3 times a week. Is due for labs.  Lab Results  Component Value Date   ALT 13 05/26/2019   AST 15 05/26/2019   ALKPHOS 73 05/26/2019   BILITOT 0.3 05/26/2019   Lab Results    Component Value Date   CHOL 219 (H) 05/26/2019   HDL 37.00 (L) 05/26/2019   LDLCALC 144 (H) 05/26/2019   TRIG 194.0 (H) 05/26/2019   CHOLHDL 6 05/26/2019   At risk for diabetes mellitus. Autumn Curry is at higher than average risk for developing diabetes due to her obesity.   Assessment/Plan:   Prediabetes. Autumn Curry will continue to work on weight loss, exercise, and decreasing simple carbohydrates to help decrease the risk of diabetes. Hemoglobin A1c, Insulin, random labs ordered. Will consider Victoza.  Vitamin D deficiency. Low Vitamin D level contributes to fatigue and are associated with obesity, breast, and colon cancer. VITAMIN D 25 Hydroxy (Vit-D Deficiency, Fractures) level ordered.  Other hyperlipidemia. Cardiovascular risk and specific lipid/LDL goals reviewed.  We discussed several lifestyle modifications today and Autumn Curry will continue to work on diet, exercise and weight loss efforts. Orders and follow up as documented in patient record. Comprehensive metabolic panel, Lipid Panel With LDL/HDL Ratio labs ordered. She will continue with exercise.  Counseling Intensive lifestyle modifications are the first line treatment for this issue.  Dietary changes: Increase soluble fiber. Decrease simple carbohydrates.  Exercise changes: Moderate to vigorous-intensity aerobic activity 150 minutes per week if tolerated.  Lipid-lowering medications: see documented in medical record.    At risk for diabetes mellitus. Autumn Curry was given approximately 15 minutes of diabetes education and counseling today. We discussed intensive lifestyle modifications today with an emphasis on weight loss as well as increasing exercise and decreasing simple carbohydrates in her  diet. We also reviewed medication options with an emphasis on risk versus benefit of those discussed.   Repetitive spaced learning was employed today to elicit superior memory formation and behavioral change.  Class 3 severe obesity with  serious comorbidity and body mass index (BMI) of 45.0 to 49.9 in adult, unspecified obesity type (Autumn Curry).  Autumn Curry is currently in the action stage of change. As such, her goal is to continue with weight loss efforts. She has agreed to the Category 2 Plan.   Exercise goals: For substantial health benefits, adults should do at least 150 minutes (2 hours and 30 minutes) a week of moderate-intensity, or 75 minutes (1 hour and 15 minutes) a week of vigorous-intensity aerobic physical activity, or an equivalent combination of moderate- and vigorous-intensity aerobic activity. Aerobic activity should be performed in episodes of at least 10 minutes, and preferably, it should be spread throughout the week.  Behavioral modification strategies: decreasing eating out and keeping healthy foods in the home.  [name] has agreed to follow-up with our clinic in 2 weeks. She was informed of the importance of frequent follow-up visits to maximize her success with intensive lifestyle modifications for her multiple health conditions.   Autumn Curry was informed we would discuss her lab results at her next visit unless there is a critical issue that needs to be addressed sooner. Autumn Curry agreed to keep her next visit at the agreed upon time to discuss these results.  Objective:   Blood pressure 122/86, pulse 73, temperature 98 F (36.7 C), temperature source Oral, height 5\' 2"  (1.575 m), weight 252 lb (114.3 kg), SpO2 97 %. Body mass index is 46.09 kg/m.  General: Cooperative, alert, well developed, in no acute distress. HEENT: Conjunctivae and lids unremarkable. Cardiovascular: Regular rhythm.  Lungs: Normal work of breathing. Neurologic: No focal deficits.   Lab Results  Component Value Date   CREATININE 0.84 05/26/2019   BUN 18 05/26/2019   NA 139 05/26/2019   K 3.8 05/26/2019   CL 102 05/26/2019   CO2 29 05/26/2019   Lab Results  Component Value Date   ALT 13 05/26/2019   AST 15 05/26/2019   ALKPHOS 73  05/26/2019   BILITOT 0.3 05/26/2019   Lab Results  Component Value Date   HGBA1C 5.6 05/26/2019   HGBA1C 5.7 (H) 02/22/2019   HGBA1C 5.6 09/13/2018   HGBA1C 5.7 (H) 04/22/2018   HGBA1C 5.7 (H) 12/14/2017   Lab Results  Component Value Date   INSULIN 60.9 (H) 02/22/2019   INSULIN 24.8 09/13/2018   INSULIN 28.7 (H) 04/22/2018   INSULIN 31.4 (H) 12/14/2017   Lab Results  Component Value Date   TSH 1.89 05/26/2019   Lab Results  Component Value Date   CHOL 219 (H) 05/26/2019   HDL 37.00 (L) 05/26/2019   LDLCALC 144 (H) 05/26/2019   TRIG 194.0 (H) 05/26/2019   CHOLHDL 6 05/26/2019   Lab Results  Component Value Date   WBC 6.3 05/26/2019   HGB 12.1 05/26/2019   HCT 36.3 05/26/2019   MCV 89.1 05/26/2019   PLT 343.0 05/26/2019   No results found for: IRON, TIBC, FERRITIN  Attestation Statements:   Reviewed by clinician on day of visit: allergies, medications, problem list, medical history, surgical history, family history, social history, and previous encounter notes.  IMichaelene Song, am acting as transcriptionist for Abby Potash, PA-C   I have reviewed the above documentation for accuracy and completeness, and I agree with the above. Abby Potash, PA-C

## 2019-10-11 LAB — COMPREHENSIVE METABOLIC PANEL
ALT: 13 IU/L (ref 0–32)
AST: 15 IU/L (ref 0–40)
Albumin/Globulin Ratio: 1.3 (ref 1.2–2.2)
Albumin: 4 g/dL (ref 3.8–4.8)
Alkaline Phosphatase: 80 IU/L (ref 39–117)
BUN/Creatinine Ratio: 16 (ref 9–23)
BUN: 12 mg/dL (ref 6–20)
Bilirubin Total: 0.3 mg/dL (ref 0.0–1.2)
CO2: 24 mmol/L (ref 20–29)
Calcium: 9.8 mg/dL (ref 8.7–10.2)
Chloride: 100 mmol/L (ref 96–106)
Creatinine, Ser: 0.76 mg/dL (ref 0.57–1.00)
GFR calc Af Amer: 114 mL/min/{1.73_m2} (ref >59)
GFR calc non Af Amer: 99 mL/min/{1.73_m2} (ref >59)
Globulin, Total: 3.1 g/dL (ref 1.5–4.5)
Glucose: 103 mg/dL — ABNORMAL HIGH (ref 65–99)
Potassium: 3.8 mmol/L (ref 3.5–5.2)
Sodium: 139 mmol/L (ref 134–144)
Total Protein: 7.1 g/dL (ref 6.0–8.5)

## 2019-10-11 LAB — HEMOGLOBIN A1C
Est. average glucose Bld gHb Est-mCnc: 114 mg/dL
Hgb A1c MFr Bld: 5.6 % (ref 4.8–5.6)

## 2019-10-11 LAB — INSULIN, RANDOM: INSULIN: 30.3 u[IU]/mL — ABNORMAL HIGH (ref 2.6–24.9)

## 2019-10-11 LAB — LIPID PANEL WITH LDL/HDL RATIO
Cholesterol, Total: 229 mg/dL — ABNORMAL HIGH (ref 100–199)
HDL: 40 mg/dL (ref >39)
LDL Chol Calc (NIH): 167 mg/dL — ABNORMAL HIGH (ref 0–99)
LDL/HDL Ratio: 4.2 ratio — ABNORMAL HIGH (ref 0.0–3.2)
Triglycerides: 120 mg/dL (ref 0–149)
VLDL Cholesterol Cal: 22 mg/dL (ref 5–40)

## 2019-10-11 LAB — VITAMIN D 25 HYDROXY (VIT D DEFICIENCY, FRACTURES): Vit D, 25-Hydroxy: 56.1 ng/mL (ref 30.0–100.0)

## 2019-10-26 ENCOUNTER — Ambulatory Visit (INDEPENDENT_AMBULATORY_CARE_PROVIDER_SITE_OTHER): Payer: BC Managed Care – PPO | Admitting: Physician Assistant

## 2019-10-26 ENCOUNTER — Encounter (INDEPENDENT_AMBULATORY_CARE_PROVIDER_SITE_OTHER): Payer: Self-pay | Admitting: Physician Assistant

## 2019-10-26 ENCOUNTER — Other Ambulatory Visit: Payer: Self-pay

## 2019-10-26 VITALS — BP 107/74 | HR 80 | Temp 98.3°F | Ht 62.0 in | Wt 251.0 lb

## 2019-10-26 DIAGNOSIS — R7303 Prediabetes: Secondary | ICD-10-CM

## 2019-10-26 DIAGNOSIS — Z6841 Body Mass Index (BMI) 40.0 and over, adult: Secondary | ICD-10-CM

## 2019-10-26 DIAGNOSIS — E559 Vitamin D deficiency, unspecified: Secondary | ICD-10-CM

## 2019-10-26 DIAGNOSIS — Z9189 Other specified personal risk factors, not elsewhere classified: Secondary | ICD-10-CM | POA: Diagnosis not present

## 2019-10-26 DIAGNOSIS — E7849 Other hyperlipidemia: Secondary | ICD-10-CM | POA: Diagnosis not present

## 2019-10-27 ENCOUNTER — Other Ambulatory Visit: Payer: BC Managed Care – PPO

## 2019-10-27 ENCOUNTER — Ambulatory Visit: Payer: BC Managed Care – PPO | Admitting: Gastroenterology

## 2019-10-27 ENCOUNTER — Encounter: Payer: Self-pay | Admitting: Gastroenterology

## 2019-10-27 VITALS — BP 122/86 | HR 64 | Temp 96.4°F | Ht 62.0 in | Wt 255.2 lb

## 2019-10-27 DIAGNOSIS — R1032 Left lower quadrant pain: Secondary | ICD-10-CM

## 2019-10-27 DIAGNOSIS — R1031 Right lower quadrant pain: Secondary | ICD-10-CM | POA: Diagnosis not present

## 2019-10-27 DIAGNOSIS — R197 Diarrhea, unspecified: Secondary | ICD-10-CM | POA: Diagnosis not present

## 2019-10-27 DIAGNOSIS — K625 Hemorrhage of anus and rectum: Secondary | ICD-10-CM

## 2019-10-27 DIAGNOSIS — Z01818 Encounter for other preprocedural examination: Secondary | ICD-10-CM

## 2019-10-27 MED ORDER — SUPREP BOWEL PREP KIT 17.5-3.13-1.6 GM/177ML PO SOLN: 1.0000 | ORAL | 0 refills | Status: DC

## 2019-10-27 NOTE — Patient Instructions (Signed)
Your provider has requested that you go to the basement level for lab work before leaving today. Press "B" on the elevator. The lab is located at the first door on the left as you exit the elevator.  You have been scheduled for a colonoscopy. Please follow written instructions given to you at your visit today.  Please pick up your prep supplies at the pharmacy within the next 1-3 days. If you use inhalers (even only as needed), please bring them with you on the day of your procedure.  We have sent the following medications to your pharmacy for you to pick up at your convenience: Suprep   If you are age 40 or older, your body mass index should be between 23-30. Your Body mass index is 46.69 kg/m. If this is out of the aforementioned range listed, please consider follow up with your Primary Care Provider.  If you are age 40 or younger, your body mass index should be between 19-25. Your Body mass index is 46.69 kg/m. If this is out of the aformentioned range listed, please consider follow up with your Primary Care Provider.    Due to recent changes in healthcare laws, you may see the results of your imaging and laboratory studies on MyChart before your provider has had a chance to review them.  We understand that in some cases there may be results that are confusing or concerning to you. Not all laboratory results come back in the same time frame and the provider may be waiting for multiple results in order to interpret others.  Please give [place] 48 hours in order for your provider to thoroughly review all the results before contacting the office for clarification of your results.   Thank you for choosing me and [facility].  Dr.Nandigam

## 2019-10-27 NOTE — Progress Notes (Signed)
Chief Complaint:   OBESITY Autumn Curry is here to discuss her progress with her obesity treatment plan along with follow-up of her obesity related diagnoses. Autumn Curry is on the Category 2 Plan and states she is following her eating plan approximately 90% of the time. Autumn Curry states she is exercising for 0 minutes 0 times per week.  Today's visit was #: 41 Starting weight: 253 lbs Starting date: 12/14/2017 Today's weight: 251 lbs Today's date: 10/26/2019 Total lbs lost to date: 2 lbs Total lbs lost since last in-office visit: 1 lb  Interim History: Autumn Curry thinks she has not been meeting her protein daily. She has not yet started exercising.  Subjective:   1. Vitamin D deficiency Autumn Curry's Vitamin D level was 56.1 on 10/10/2019. She is currently taking prescription vitamin D 50,000 IU each week. She denies nausea, vomiting or muscle weakness.  2. Other hyperlipidemia Autumn Curry has hyperlipidemia and has been trying to improve her cholesterol levels with intensive lifestyle modification including a low saturated fat diet, exercise and weight loss. She denies any chest pain, claudication or myalgias.  She is not on any medications.  Last lipid panel not at goal.  Lab Results  Component Value Date   ALT 13 10/10/2019   AST 15 10/10/2019   ALKPHOS 80 10/10/2019   BILITOT 0.3 10/10/2019   Lab Results  Component Value Date   CHOL 229 (H) 10/10/2019   HDL 40 10/10/2019   LDLCALC 167 (H) 10/10/2019   TRIG 120 10/10/2019   CHOLHDL 6 05/26/2019   3. Prediabetes Autumn Curry has a diagnosis of prediabetes based on her elevated HgA1c and was informed this puts her at greater risk of developing diabetes. She continues to work on diet and exercise to decrease her risk of diabetes. She denies nausea or hypoglycemia.  She is not on any medications currently.  Insulin level is elevated.  Lab Results  Component Value Date   HGBA1C 5.6 10/10/2019   Lab Results  Component Value Date   INSULIN 30.3  (H) 10/10/2019   INSULIN 60.9 (H) 02/22/2019   INSULIN 24.8 09/13/2018   INSULIN 28.7 (H) 04/22/2018   INSULIN 31.4 (H) 12/14/2017   4. At risk for heart disease Autumn Curry is at a higher than average risk for cardiovascular disease due to obesity.   Assessment/Plan:   1. Vitamin D deficiency Low Vitamin D level contributes to fatigue and are associated with obesity, breast, and colon cancer. She agrees to stop prescription Vitamin D and start OTC Vitamin D @5 ,000 IU daily and will follow-up for routine testing of Vitamin D, at least 2-3 times per year to avoid over-replacement.  2. Other hyperlipidemia Educated on elevated lipids and encouraged medication, but patient refusing at this time.  Counseling Intensive lifestyle modifications are the first line treatment for this issue. . Dietary changes: Increase soluble fiber. Decrease simple carbohydrates. . Exercise changes: Moderate to vigorous-intensity aerobic activity 150 minutes per week if tolerated. . Lipid-lowering medications: see documented in medical record.  3. Prediabetes Refusing metformin and Victoza at this time.  4. At risk for heart disease Autumn Curry was given approximately 15 minutes of coronary artery disease prevention counseling today. She is 39 y.o. female and has risk factors for heart disease including obesity. We discussed intensive lifestyle modifications today with an emphasis on specific weight loss instructions and strategies.   Repetitive spaced learning was employed today to elicit superior memory formation and behavioral change.  5. Class 3 severe obesity with serious comorbidity and body  mass index (BMI) of 45.0 to 49.9 in adult, unspecified obesity type (HCC) Autumn Curry is currently in the action stage of change. As such, her goal is to continue with weight loss efforts. She has agreed to keeping a food journal and adhering to recommended goals of 1000-1200 calories and 80 grams of protein daily.   Exercise  goals: For substantial health benefits, adults should do at least 150 minutes (2 hours and 30 minutes) a week of moderate-intensity, or 75 minutes (1 hour and 15 minutes) a week of vigorous-intensity aerobic physical activity, or an equivalent combination of moderate- and vigorous-intensity aerobic activity. Aerobic activity should be performed in episodes of at least 10 minutes, and preferably, it should be spread throughout the week.  Behavioral modification strategies: increasing lean protein intake and meal planning and cooking strategies.  Autumn Curry has agreed to follow-up with our clinic in 2 weeks. She was informed of the importance of frequent follow-up visits to maximize her success with intensive lifestyle modifications for her multiple health conditions.   Objective:   Blood pressure 107/74, pulse 80, temperature 98.3 F (36.8 C), temperature source Oral, height 5\' 2"  (1.575 m), weight 251 lb (113.9 kg), SpO2 98 %. Body mass index is 45.91 kg/m.  General: Cooperative, alert, well developed, in no acute distress. HEENT: Conjunctivae and lids unremarkable. Cardiovascular: Regular rhythm.  Lungs: Normal work of breathing. Neurologic: No focal deficits.   Lab Results  Component Value Date   CREATININE 0.76 10/10/2019   BUN 12 10/10/2019   NA 139 10/10/2019   K 3.8 10/10/2019   CL 100 10/10/2019   CO2 24 10/10/2019   Lab Results  Component Value Date   ALT 13 10/10/2019   AST 15 10/10/2019   ALKPHOS 80 10/10/2019   BILITOT 0.3 10/10/2019   Lab Results  Component Value Date   HGBA1C 5.6 10/10/2019   HGBA1C 5.6 05/26/2019   HGBA1C 5.7 (H) 02/22/2019   HGBA1C 5.6 09/13/2018   HGBA1C 5.7 (H) 04/22/2018   Lab Results  Component Value Date   INSULIN 30.3 (H) 10/10/2019   INSULIN 60.9 (H) 02/22/2019   INSULIN 24.8 09/13/2018   INSULIN 28.7 (H) 04/22/2018   INSULIN 31.4 (H) 12/14/2017   Lab Results  Component Value Date   TSH 1.89 05/26/2019   Lab Results    Component Value Date   CHOL 229 (H) 10/10/2019   HDL 40 10/10/2019   LDLCALC 167 (H) 10/10/2019   TRIG 120 10/10/2019   CHOLHDL 6 05/26/2019   Lab Results  Component Value Date   WBC 6.3 05/26/2019   HGB 12.1 05/26/2019   HCT 36.3 05/26/2019   MCV 89.1 05/26/2019   PLT 343.0 05/26/2019   Attestation Statements:   Reviewed by clinician on day of visit: allergies, medications, problem list, medical history, surgical history, family history, social history, and previous encounter notes.  I, Water quality scientist, CMA, am acting as Location manager for [employer], PA-C.  I have reviewed the above documentation for accuracy and completeness, and I agree with the above. Abby Potash, PA-C

## 2019-10-27 NOTE — Progress Notes (Signed)
Autumn Curry    FB:[id number]    [birth year]  Primary Care Physician:Lowne Cheri Rous Alferd Apa, DO  Referring Physician: Carollee Herter, Alferd Apa, DO [address] [place],  [place] [address]   Chief complaint:  Abdominal pain, BRBPR, alternating constipation and diarrhea  HPI:  39 year old female here for new patient visit with complaints of change in bowel habits with alternating constipation and diarrhea  She initially started having diarrhea 6 months ago, was started on Metformin for borderline diabetes but she stopped it and continues to have persistent diarrhea intermittently  On average she has 5-6 bowel movements per day with formed to liquid stool on 4 to 5 days a week and she has constipation on 1 or 2 days a week  She has fecal urgency and also had an episode of bright red blood per rectum after bowel movement when she wiped last week   H/o HIV with undetectable viral load   No family history of IBD or colon cancer  No decreased appetite or weight loss   Outpatient Encounter Medications as of 10/27/2019  Medication Sig  . aspirin-acetaminophen-caffeine (EXCEDRIN MIGRAINE) 250-250-65 MG tablet Take by mouth every 6 (six) hours as needed for headache.  [name] atenolol (TENORMIN) 25 MG tablet Take 1 tablet by mouth once daily  . Cholecalciferol (VITAMIN D) 125 MCG (5000 UT) CAPS Take 1 capsule by mouth daily.  . Darunavir-Cobicisctat-Emtricitabine-Tenofovir Alafenamide (SYMTUZA) 800-150-200-10 MG TABS Take 1 tablet by mouth daily with breakfast.  . hydrochlorothiazide (HYDRODIURIL) 25 MG tablet TAKE 1 TABLET BY MOUTH ONCE DAILY **  MUST  HAVE  OFFICE  VISIT  BEFORE  FURTHER  REFILLS**  . Ibuprofen 200 MG CAPS Take 200 mg by mouth every 6 (six) hours as needed.   . loratadine (CLARITIN) 10 MG tablet Take 10 mg by mouth daily.  . Multiple Vitamins-Minerals (ALIVE WOMENS ENERGY) TABS Take 1 tablet by mouth daily.  . valACYclovir (VALTREX) 500 MG tablet  Take 1 tablet (500 mg total) by mouth 2 (two) times daily. (Patient taking differently: Take 500 mg by mouth 2 (two) times daily as needed. )  . [DISCONTINUED] atenolol (TENORMIN) 25 MG tablet Take 1 tablet (25 mg total) by mouth daily. (Patient not taking: Reported on 10/27/2019)  . [DISCONTINUED] hydrochlorothiazide (HYDRODIURIL) 25 MG tablet TAKE 1 TABLET BY MOUTH ONCE DAILY (Patient not taking: Reported on 10/27/2019)  . [DISCONTINUED] Vitamin D, Ergocalciferol, (DRISDOL) 1.25 MG (50000 UNIT) CAPS capsule Take 1 capsule (50,000 Units total) by mouth every 7 (seven) days. (Patient not taking: Reported on 10/27/2019)   No facility-administered encounter medications on file as of 10/27/2019.    Allergies as of 10/27/2019  . (No Known Allergies)    Past Medical History:  Diagnosis Date  . Absence of menstruation   . Carbuncle and furuncle of unspecified site   . Elevated blood pressure reading without diagnosis of hypertension   . Encounter for long-term (current) use of other medications   . Headache(784.0)   . 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 Hyperlipidemia   . Hypertension   . Routine general medical examination at a health care facility   . Screening for malignant neoplasm of the cervix   . Syphilis, unspecified     Past Surgical History:  Procedure Laterality Date  . NO PAST SURGERIES      Family History  Problem Relation Age of Onset  . Hyperlipidemia Mother   .  Hypertension Mother   . Depression Mother   . Coronary artery disease Father   . Diabetes Father   . Heart disease Father   . Hyperlipidemia Father   . Stroke Father   . Kidney disease Father   . Colon cancer Neg Hx   . Esophageal cancer Neg Hx   . Colon polyps Neg Hx   . Pancreatic cancer Neg Hx   . Stomach cancer Neg Hx     Social History   Socioeconomic History  . Marital status: Married    Spouse name: Randal Gerardot  . Number of children: 1  . Years of education: Not on file  . Highest  education level: Not on file  Occupational History  . Occupation: Pharmacist, hospital    Comment: head start   Tobacco Use  . Smoking status: Never Smoker  . Smokeless tobacco: Never Used  Substance and Sexual Activity  . Alcohol use: No    Alcohol/week: 0.0 standard drinks  . Drug use: No  . Sexual activity: Yes    Partners: Male    Birth control/protection: Condom    Comment: given condoms 05/2019  Other Topics Concern  . Not on file  Social History Narrative  . Not on file   Social Determinants of Health   Financial Resource Strain:   . Difficulty of Paying Living Expenses:   Food Insecurity:   . Worried About Charity fundraiser in the Last Year:   . Arboriculturist in the Last Year:   Transportation Needs:   . Film/video editor (Medical):   [name] Lack of Transportation (Non-Medical):   Physical Activity: Insufficiently Active  . Days of Exercise per Week: 3 days  . Minutes of Exercise per Session: 30 min  Stress:   . Feeling of Stress :   Social Connections:   . Frequency of Communication with Friends and Family:   . Frequency of Social Gatherings with Friends and Family:   . Attends Religious Services:   . Active Member of Clubs or Organizations:   . Attends Archivist Meetings:   [name] Marital Status:   Intimate Partner Violence:   . Fear of Current or Ex-Partner:   . Emotionally Abused:   [name] Physically Abused:   . Sexually Abused:       Review of systems: All other review of systems negative except as mentioned in the HPI.   Physical Exam: Vitals:   10/27/19 0851  BP: 122/86  Pulse: 64  Temp: (!) 96.4 F (35.8 C)  SpO2: 97%   Body mass index is 46.69 kg/m. Gen:      No acute distress Abd:       soft, lateral lower quadrant-tender; no palpable masses, no distension Neuro: alert and oriented x 3 Psych: normal mood and affect  Data Reviewed:  Reviewed labs, radiology imaging, old records and pertinent past GI work up   Assessment and  Plan/Recommendations:  39 year old very pleasant female here with complaints of chronic diarrhea, change in bowel habits and bright red blood per rectum Has lower abdominal cramping and tenderness bilateral lower quadrant History of HIV with undetectable viral load on [place] treatment   Check GI pathogen panel to exclude infectious etiology Schedule for colonoscopy with biopsies to evaluate for possible IBD or microscopic colitis   The patient was provided an opportunity to ask questions and all were answered. The patient agreed with the plan and demonstrated an understanding of the instructions.  Damaris Hippo ,  MD    CC: Carollee Herter, Alferd Apa, *

## 2019-10-28 ENCOUNTER — Encounter: Payer: Self-pay | Admitting: Gastroenterology

## 2019-11-07 ENCOUNTER — Other Ambulatory Visit: Payer: BC Managed Care – PPO

## 2019-11-07 DIAGNOSIS — K625 Hemorrhage of anus and rectum: Secondary | ICD-10-CM

## 2019-11-07 DIAGNOSIS — R1031 Right lower quadrant pain: Secondary | ICD-10-CM

## 2019-11-07 DIAGNOSIS — R1032 Left lower quadrant pain: Secondary | ICD-10-CM

## 2019-11-07 DIAGNOSIS — R197 Diarrhea, unspecified: Secondary | ICD-10-CM | POA: Diagnosis not present

## 2019-11-07 DIAGNOSIS — R152 Fecal urgency: Secondary | ICD-10-CM | POA: Diagnosis not present

## 2019-11-07 DIAGNOSIS — K5900 Constipation, unspecified: Secondary | ICD-10-CM | POA: Diagnosis not present

## 2019-11-07 DIAGNOSIS — R194 Change in bowel habit: Secondary | ICD-10-CM | POA: Diagnosis not present

## 2019-11-09 ENCOUNTER — Encounter (INDEPENDENT_AMBULATORY_CARE_PROVIDER_SITE_OTHER): Payer: Self-pay | Admitting: Physician Assistant

## 2019-11-09 ENCOUNTER — Ambulatory Visit (INDEPENDENT_AMBULATORY_CARE_PROVIDER_SITE_OTHER): Payer: BC Managed Care – PPO | Admitting: Physician Assistant

## 2019-11-09 ENCOUNTER — Other Ambulatory Visit: Payer: Self-pay

## 2019-11-09 VITALS — BP 106/73 | HR 79 | Temp 99.1°F | Ht 62.0 in | Wt 254.0 lb

## 2019-11-09 DIAGNOSIS — E7849 Other hyperlipidemia: Secondary | ICD-10-CM

## 2019-11-09 DIAGNOSIS — Z6841 Body Mass Index (BMI) 40.0 and over, adult: Secondary | ICD-10-CM

## 2019-11-09 LAB — GI PROFILE, STOOL, PCR

## 2019-11-10 NOTE — Progress Notes (Signed)
Chief Complaint:   OBESITY Autumn Curry is here to discuss her progress with her obesity treatment plan along with follow-up of her obesity related diagnoses. Autumn Curry is on the Category 2 Plan and states she is following her eating plan approximately 90-95% of the time. Autumn Curry states she is doing cardio 30 minutes 4 times per week.  Today's visit was #: 55 Starting weight: 253 lbs Starting date: 12/14/2017 Today's weight: 254 lbs Today's date: 11/09/2019 Total lbs lost to date: 0 Total lbs lost since last in-office visit: 0  Interim History: Autumn Curry is disappointed with lack of weight loss as she feels she did well. She is overeating her snack calories. She reports she is journaling more often.  Subjective:   Other hyperlipidemia. Autumn Curry has hyperlipidemia and has been trying to improve her cholesterol levels with intensive lifestyle modification including a low saturated fat diet, exercise and weight loss. She denies any chest pain. Autumn Curry is on no medication. She is exercising regularly.  Lab Results  Component Value Date   ALT 13 10/10/2019   AST 15 10/10/2019   ALKPHOS 80 10/10/2019   BILITOT 0.3 10/10/2019   Lab Results  Component Value Date   CHOL 229 (H) 10/10/2019   HDL 40 10/10/2019   LDLCALC 167 (H) 10/10/2019   TRIG 120 10/10/2019   CHOLHDL 6 05/26/2019   Assessment/Plan:   Other hyperlipidemia. Cardiovascular risk and specific lipid/LDL goals reviewed.  We discussed several lifestyle modifications today and [name] will continue to work on diet, exercise and weight loss efforts. Orders and follow up as documented in patient record.   Counseling Intensive lifestyle modifications are the first line treatment for this issue. . Dietary changes: Increase soluble fiber. Decrease simple carbohydrates. . Exercise changes: Moderate to vigorous-intensity aerobic activity 150 minutes per week if tolerated. . Lipid-lowering medications: see documented in  medical record.  Class 3 severe obesity with serious comorbidity and body mass index (BMI) of 45.0 to 49.9 in adult, unspecified obesity type ([place]).  Autumn Curry is currently in the action stage of change. As such, her goal is to continue with weight loss efforts. She has agreed to keeping a food journal and adhering to recommended goals of 1000-1050 calories and 75 grams of protein daily.   Exercise goals: For substantial health benefits, adults should do at least 150 minutes (2 hours and 30 minutes) a week of moderate-intensity, or 75 minutes (1 hour and 15 minutes) a week of vigorous-intensity aerobic physical activity, or an equivalent combination of moderate- and vigorous-intensity aerobic activity. Aerobic activity should be performed in episodes of at least 10 minutes, and preferably, it should be spread throughout the week.  Behavioral modification strategies: planning for success and keeping a strict food journal.  Autumn Curry has agreed to follow-up with our clinic in 2 weeks. She was informed of the importance of frequent follow-up visits to maximize her success with intensive lifestyle modifications for her multiple health conditions.   Objective:   Blood pressure 106/73, pulse 79, temperature 99.1 F (37.3 C), temperature source Oral, height 5\' 2"  (1.575 m), weight 254 lb (115.2 kg), SpO2 98 %. Body mass index is 46.46 kg/m.  General: Cooperative, alert, well developed, in no acute distress. HEENT: Conjunctivae and lids unremarkable. Cardiovascular: Regular rhythm.  Lungs: Normal work of breathing. Neurologic: No focal deficits.   Lab Results  Component Value Date   CREATININE 0.76 10/10/2019   BUN 12 10/10/2019   NA 139 10/10/2019   K 3.8 10/10/2019  CL 100 10/10/2019   CO2 24 10/10/2019   Lab Results  Component Value Date   ALT 13 10/10/2019   AST 15 10/10/2019   ALKPHOS 80 10/10/2019   BILITOT 0.3 10/10/2019   Lab Results  Component Value Date   HGBA1C 5.6  10/10/2019   HGBA1C 5.6 05/26/2019   HGBA1C 5.7 (H) 02/22/2019   HGBA1C 5.6 09/13/2018   HGBA1C 5.7 (H) 04/22/2018   Lab Results  Component Value Date   INSULIN 30.3 (H) 10/10/2019   INSULIN 60.9 (H) 02/22/2019   INSULIN 24.8 09/13/2018   INSULIN 28.7 (H) 04/22/2018   INSULIN 31.4 (H) 12/14/2017   Lab Results  Component Value Date   TSH 1.89 05/26/2019   Lab Results  Component Value Date   CHOL 229 (H) 10/10/2019   HDL 40 10/10/2019   LDLCALC 167 (H) 10/10/2019   TRIG 120 10/10/2019   CHOLHDL 6 05/26/2019   Lab Results  Component Value Date   WBC 6.3 05/26/2019   HGB 12.1 05/26/2019   HCT 36.3 05/26/2019   MCV 89.1 05/26/2019   PLT 343.0 05/26/2019   No results found for: IRON, TIBC, FERRITIN  Attestation Statements:   Reviewed by clinician on day of visit: allergies, medications, problem list, medical history, surgical history, family history, social history, and previous encounter notes.  Time spent on visit including pre-visit chart review and post-visit charting and care was 31 minutes.   IMichaelene Song, am acting as transcriptionist for Abby Potash, PA-C   I have reviewed the above documentation for accuracy and completeness, and I agree with the above. Abby Potash, PA-C

## 2019-11-24 ENCOUNTER — Encounter: Payer: Self-pay | Admitting: Gastroenterology

## 2019-11-24 ENCOUNTER — Ambulatory Visit (INDEPENDENT_AMBULATORY_CARE_PROVIDER_SITE_OTHER): Payer: BC Managed Care – PPO | Admitting: Physician Assistant

## 2019-12-02 ENCOUNTER — Ambulatory Visit (INDEPENDENT_AMBULATORY_CARE_PROVIDER_SITE_OTHER): Payer: BC Managed Care – PPO

## 2019-12-02 ENCOUNTER — Other Ambulatory Visit: Payer: Self-pay | Admitting: Gastroenterology

## 2019-12-02 DIAGNOSIS — Z1159 Encounter for screening for other viral diseases: Secondary | ICD-10-CM

## 2019-12-03 LAB — SARS CORONAVIRUS 2 (TAT 6-24 HRS): SARS Coronavirus 2: NEGATIVE

## 2019-12-06 ENCOUNTER — Ambulatory Visit (AMBULATORY_SURGERY_CENTER): Payer: BC Managed Care – PPO | Admitting: Gastroenterology

## 2019-12-06 ENCOUNTER — Other Ambulatory Visit: Payer: Self-pay

## 2019-12-06 ENCOUNTER — Encounter: Payer: Self-pay | Admitting: Gastroenterology

## 2019-12-06 VITALS — BP 101/64 | HR 57 | Temp 96.8°F | Resp 17 | Ht 62.0 in | Wt 255.0 lb

## 2019-12-06 DIAGNOSIS — K649 Unspecified hemorrhoids: Secondary | ICD-10-CM

## 2019-12-06 DIAGNOSIS — K621 Rectal polyp: Secondary | ICD-10-CM | POA: Diagnosis not present

## 2019-12-06 DIAGNOSIS — R197 Diarrhea, unspecified: Secondary | ICD-10-CM | POA: Diagnosis not present

## 2019-12-06 DIAGNOSIS — K5730 Diverticulosis of large intestine without perforation or abscess without bleeding: Secondary | ICD-10-CM

## 2019-12-06 DIAGNOSIS — K625 Hemorrhage of anus and rectum: Secondary | ICD-10-CM | POA: Diagnosis not present

## 2019-12-06 DIAGNOSIS — D128 Benign neoplasm of rectum: Secondary | ICD-10-CM

## 2019-12-06 MED ORDER — SODIUM CHLORIDE 0.9 % IV SOLN: 500.0000 mL | Freq: Once | INTRAVENOUS | Status: DC

## 2019-12-06 NOTE — Patient Instructions (Signed)
HANDOUTS PROVIDED ON: POLYPS, DIVERTICULOSIS, & HEMORRHOIDS  The polyp removed/biopsies taken today have been sent for pathology.  The results can take 1-3 weeks to receive.  When your next colonoscopy should occur will be based on the pathology results.    You may resume your previous diet and medication schedule.  Thank you for allowing [place] to care for you today!!!   YOU HAD AN ENDOSCOPIC PROCEDURE TODAY AT [facility]:   Refer to the procedure report that was given to you for any specific questions about what was found during the examination.  If the procedure report does not answer your questions, please call your gastroenterologist to clarify.  If you requested that your care partner not be given the details of your procedure findings, then the procedure report has been included in a sealed envelope for you to review at your convenience later.  YOU SHOULD EXPECT: Some feelings of bloating in the abdomen. Passage of more gas than usual.  Walking can help get rid of the air that was put into your GI tract during the procedure and reduce the bloating. If you had a lower endoscopy (such as a colonoscopy or flexible sigmoidoscopy) you may notice spotting of blood in your stool or on the toilet paper. If you underwent a bowel prep for your procedure, you may not have a normal bowel movement for a few days.  Please Note:  You might notice some irritation and congestion in your nose or some drainage.  This is from the oxygen used during your procedure.  There is no need for concern and it should clear up in a day or so.  SYMPTOMS TO REPORT IMMEDIATELY:   Following lower endoscopy (colonoscopy or flexible sigmoidoscopy):  Excessive amounts of blood in the stool  Significant tenderness or worsening of abdominal pains  Swelling of the abdomen that is new, acute  Fever of 100F or higher  For urgent or emergent issues, a gastroenterologist can be reached at any hour by calling [phone]  [phone]. Do not use MyChart messaging for urgent concerns.    DIET:  We do recommend a small meal at first, but then you may proceed to your regular diet.  Drink plenty of fluids but you should avoid alcoholic beverages for 24 hours.  ACTIVITY:  You should plan to take it easy for the rest of today and you should NOT DRIVE or use heavy machinery until tomorrow (because of the sedation medicines used during the test).    FOLLOW UP: Our staff will call the number listed on your records 48-72 hours following your procedure to check on you and address any questions or concerns that you may have regarding the information given to you following your procedure. If we do not reach you, we will leave a message.  We will attempt to reach you two times.  During this call, we will ask if you have developed any symptoms of COVID 19. If you develop any symptoms (ie: fever, flu-like symptoms, shortness of breath, cough etc.) before then, please call [phone].  If you test positive for Covid 19 in the 2 weeks post procedure, please call and report this information to [place].    If any biopsies were taken you will be contacted by phone or by letter within the next 1-3 weeks.  Please call us at [phone] if you have not heard about the biopsies in 3 weeks.    SIGNATURES/CONFIDENTIALITY: You and/or your care partner have signed paperwork which  will be entered into your electronic medical record.  These signatures attest to the fact that that the information above on your After Visit Summary has been reviewed and is understood.  Full responsibility of the confidentiality of this discharge information lies with you and/or your care-partner.

## 2019-12-06 NOTE — Op Note (Signed)
[facility] Patient Name: Autumn Curry Procedure Date: 12/06/2019 11:10 AM MRN: [id number] Endoscopist: Mauri Pole , MD Age: 39 Referring MD:  Date of Birth: 11/25/1980 Gender: Female Account #: [id number] Procedure:                Colonoscopy Indications:              Evaluation of unexplained GI bleeding presenting                            with BRBPR, Clinically significant diarrhea of                            unexplained origin Medicines:                Monitored Anesthesia Care Procedure:                Pre-Anesthesia Assessment:                           - Prior to the procedure, a History and Physical                            was performed, and patient medications and                            allergies were reviewed. The patient's tolerance of                            previous anesthesia was also reviewed. The risks                            and benefits of the procedure and the sedation                            options and risks were discussed with the patient.                            All questions were answered, and informed consent                            was obtained. Prior Anticoagulants: The patient has                            taken no previous anticoagulant or antiplatelet                            agents. ASA Grade Assessment: II - A patient with                            mild systemic disease. After reviewing the risks                            and benefits, the patient was deemed in  satisfactory condition to undergo the procedure.                           After obtaining informed consent, the colonoscope                            was passed under direct vision. Throughout the                            procedure, the patient's blood pressure, pulse, and                            oxygen saturations were monitored continuously. The                            Colonoscope was introduced through the  anus and                            advanced to the the cecum, identified by                            appendiceal orifice and ileocecal valve. The                            colonoscopy was performed without difficulty. The                            patient tolerated the procedure well. The quality                            of the bowel preparation was not adequate to                            identify polyps 6 mm and larger in size in the                            right colon. The ileocecal valve, appendiceal                            orifice, and rectum were photographed. Scope In: 11:20:23 AM Scope Out: 11:34:20 AM Scope Withdrawal Time: 0 hours 11 minutes 4 seconds  Total Procedure Duration: 0 hours 13 minutes 57 seconds  Findings:                 The perianal and digital rectal examinations were                            normal.                           A less than 1 mm polyp was found in the rectum. The                            polyp was sessile. The polyp was removed with a  cold biopsy forceps. Resection and retrieval were                            complete.                           Normal mucosa was found in the entire colon.                            Biopsies for histology were taken with a cold                            forceps from the right colon and left colon for                            evaluation of microscopic colitis.                           A patchy area of mucosa in the terminal ileum was                            mildly erythematous. Biopsies were taken with a                            cold forceps for histology.                           A few small-mouthed diverticula were found in the                            sigmoid colon, descending colon, transverse colon                            and ascending colon.                           Non-bleeding internal hemorrhoids were found during                             retroflexion. The hemorrhoids were small. Complications:            No immediate complications. Estimated Blood Loss:     Estimated blood loss was minimal. Impression:               - One less than 1 mm polyp in the rectum, removed                            with a cold biopsy forceps. Resected and retrieved.                           - Normal mucosa in the entire examined colon.                            Biopsied.                           -  Erythematous mucosa in the terminal ileum.                            Biopsied.                           - Diverticulosis in the sigmoid colon, in the                            descending colon, in the transverse colon and in                            the ascending colon.                           - Non-bleeding internal hemorrhoids. Recommendation:           - Patient has a contact number available for                            emergencies. The signs and symptoms of potential                            delayed complications were discussed with the                            patient. Return to normal activities tomorrow.                            Written discharge instructions were provided to the                            patient.                           - Resume previous diet.                           - Continue present medications.                           - Await pathology results.                           - Return to GI clinic at the next available                            appointment.                           - Repeat colonoscopy at age 40 for screening                            purposes as the prep in the right colon was                            inadequate. Mauri Pole, MD 12/06/2019  11:44:39 AM This report has been signed electronically.

## 2019-12-06 NOTE — Progress Notes (Signed)
VS by DT    

## 2019-12-06 NOTE — Progress Notes (Signed)
Report given to PACU, vss 

## 2019-12-06 NOTE — Progress Notes (Signed)
Called to room to assist during endoscopic procedure.  Patient ID and intended procedure confirmed with present staff. Received instructions for my participation in the procedure from the performing physician.  

## 2019-12-07 ENCOUNTER — Other Ambulatory Visit: Payer: Self-pay

## 2019-12-08 ENCOUNTER — Telehealth: Payer: Self-pay

## 2019-12-08 ENCOUNTER — Telehealth: Payer: Self-pay | Admitting: *Deleted

## 2019-12-08 NOTE — Telephone Encounter (Signed)
  Follow up Call-  Call back number 12/06/2019  Post procedure Call Back phone  # [phone]  Permission to leave phone message Yes  Some recent data might be hidden     Patient questions:  Do you have a fever, pain , or abdominal swelling? No. Pain Score  0 *  Have you tolerated food without any problems? Yes.    Have you been able to return to your normal activities? Yes.    Do you have any questions about your discharge instructions: Diet   No. Medications  No. Follow up visit  No.  Do you have questions or concerns about your Care? No.  Actions: * If pain score is 4 or above: No action needed, pain <4.  1. Have you developed a fever since your procedure? no  2.   Have you had an respiratory symptoms (SOB or cough) since your procedure? no  3.   Have you tested positive for COVID 19 since your procedure no  4.   Have you had any family members/close contacts diagnosed with the COVID 19 since your procedure?  no   If yes to any of these questions please route to Joylene John, RN and Erenest Rasher, RN

## 2019-12-08 NOTE — Telephone Encounter (Signed)
LVM

## 2019-12-15 ENCOUNTER — Encounter: Payer: Self-pay | Admitting: Gastroenterology

## 2020-02-09 ENCOUNTER — Ambulatory Visit: Payer: BC Managed Care – PPO | Admitting: Gastroenterology

## 2020-02-09 ENCOUNTER — Encounter: Payer: Self-pay | Admitting: Gastroenterology

## 2020-02-09 VITALS — HR 75 | Ht 62.0 in | Wt 264.0 lb

## 2020-02-09 DIAGNOSIS — K9089 Other intestinal malabsorption: Secondary | ICD-10-CM | POA: Diagnosis not present

## 2020-02-09 DIAGNOSIS — K580 Irritable bowel syndrome with diarrhea: Secondary | ICD-10-CM | POA: Diagnosis not present

## 2020-02-09 NOTE — Progress Notes (Signed)
Autumn Curry    [id number]    1981/03/12  Primary Care Physician:Lowne Cheri Rous Alferd Apa, DO  Referring Physician: Carollee Herter, Alferd Apa, DO [address] [place],  [place] [address]   Chief complaint:  Diarrhea  HPI:  39 year old very pleasant female here for follow-up visit for diarrhea  History of HIV on therapy with undetectable viral load  November 07, 2019: GI pathogen panel negative  Colonoscopy Dec 06, 2019: Diverticulosis, 1 mm hyperplastic polyp removed from rectum.  Mild erythema in terminal ileum biopsies negative for any acute inflammation or abnormality.  Right and left colon biopsies showed normal mucosa, negative for IBD or microscopic colitis.  Internal hemorrhoids.   Overall her symptoms have improved.  No longer has rectal bleeding or mucus.  She has 3-4 formed bowel movements daily with intermittent fecal urgency  She is taking 2 tablets of Fiberchoice and 1 chewable daily  Denies any nausea, vomiting, abdominal pain, melena or bright red blood per rectum   Outpatient Encounter Medications as of 02/09/2020  Medication Sig  . aspirin-acetaminophen-caffeine (EXCEDRIN MIGRAINE) 250-250-65 MG tablet Take by mouth every 6 (six) hours as needed for headache.  [name] atenolol (TENORMIN) 25 MG tablet Take 1 tablet by mouth once daily  . Cholecalciferol (VITAMIN D) 125 MCG (5000 UT) CAPS Take 1 capsule by mouth daily.  . Darunavir-Cobicisctat-Emtricitabine-Tenofovir Alafenamide (SYMTUZA) 800-150-200-10 MG TABS Take 1 tablet by mouth daily with breakfast.  . hydrochlorothiazide (HYDRODIURIL) 25 MG tablet TAKE 1 TABLET BY MOUTH ONCE DAILY **  MUST  HAVE  OFFICE  VISIT  BEFORE  FURTHER  REFILLS**  . Ibuprofen 200 MG CAPS Take 200 mg by mouth every 6 (six) hours as needed.   . loratadine (CLARITIN) 10 MG tablet Take 10 mg by mouth daily.  . Multiple Vitamins-Minerals (ALIVE WOMENS ENERGY) TABS Take 1 tablet by mouth daily.  . valACYclovir (VALTREX)  500 MG tablet Take 1 tablet (500 mg total) by mouth 2 (two) times daily. (Patient taking differently: Take 500 mg by mouth 2 (two) times daily as needed. )   No facility-administered encounter medications on file as of 02/09/2020.    Allergies as of 02/09/2020  . (No Known Allergies)    Past Medical History:  Diagnosis Date  . Absence of menstruation   . Carbuncle and furuncle of unspecified site   . Elevated blood pressure reading without diagnosis of hypertension   . Encounter for long-term (current) use of other medications   . Headache(784.0)   . 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 Hyperlipidemia   . Hypertension   . Routine general medical examination at a health care facility   . Screening for malignant neoplasm of the cervix   . Syphilis, unspecified     Past Surgical History:  Procedure Laterality Date  . NO PAST SURGERIES      Family History  Problem Relation Age of Onset  . Hyperlipidemia Mother   . Hypertension Mother   . Depression Mother   . Coronary artery disease Father   . Diabetes Father   . Heart disease Father   . Hyperlipidemia Father   . Stroke Father   . Kidney disease Father   . Colon cancer Neg Hx   . Esophageal cancer Neg Hx   . Colon polyps Neg Hx   . Pancreatic cancer Neg Hx   . Stomach cancer Neg Hx  Social History   Socioeconomic History  . Marital status: Married    Spouse name: Alira Fretwell  . Number of children: 1  . Years of education: Not on file  . Highest education level: Not on file  Occupational History  . Occupation: Pharmacist, hospital    Comment: head start   Tobacco Use  . Smoking status: Never Smoker  . Smokeless tobacco: Never Used  Vaping Use  . Vaping Use: Never used  Substance and Sexual Activity  . Alcohol use: No    Alcohol/week: 0.0 standard drinks  . Drug use: No  . Sexual activity: Yes    Partners: Male    Birth control/protection: Condom    Comment: given condoms 05/2019  Other Topics Concern  . Not  on file  Social History Narrative  . Not on file   Social Determinants of Health   Financial Resource Strain:   . Difficulty of Paying Living Expenses:   Food Insecurity:   . Worried About Charity fundraiser in the Last Year:   . Arboriculturist in the Last Year:   Transportation Needs:   . Film/video editor (Medical):   [name] Lack of Transportation (Non-Medical):   Physical Activity: Insufficiently Active  . Days of Exercise per Week: 3 days  . Minutes of Exercise per Session: 30 min  Stress:   . Feeling of Stress :   Social Connections:   . Frequency of Communication with Friends and Family:   . Frequency of Social Gatherings with Friends and Family:   . Attends Religious Services:   . Active Member of Clubs or Organizations:   . Attends Archivist Meetings:   [name] Marital Status:   Intimate Partner Violence:   . Fear of Current or Ex-Partner:   . Emotionally Abused:   [name] Physically Abused:   . Sexually Abused:       Review of systems:  All other review of systems negative except as mentioned in the HPI.   Physical Exam: Vitals:   02/09/20 0823  Pulse: 75   Body mass index is 48.29 kg/m. Gen:      No acute distress Neuro: alert and oriented x 3 Psych: normal mood and affect  Data Reviewed:  Reviewed labs, radiology imaging, old records and pertinent past GI work up   Assessment and Plan/Recommendations:  39 year old very pleasant female with history of HIV on treatment with undetectable viral load and chronic diarrhea here for follow-up visit  Work-up so far negative for infectious etiology, inflammatory bowel disease or microscopic colitis  Possible bile salt induced diarrhea Trial of Colestid 1 g daily for 1 month.  Advised patient to avoid taking it within 3 to 4 hours of other medications to prevent drug interaction  Trial of lactose-free diet for 1 to 2 weeks  Continue Fiberchoice 1 tablet twice daily with meals, advised patient to  avoid taking all the tablets at once and to spread it throughout the day  Return in 3 to 4 months or sooner if needed  The patient was provided an opportunity to ask questions and all were answered. The patient agreed with the plan and demonstrated an understanding of the instructions.  Damaris Hippo , MD    CC: Carollee Herter, Alferd Apa, *

## 2020-02-09 NOTE — Patient Instructions (Signed)
Continue Fiberchoice  Take Colestid 1 gm daily ( AVOID taking it with in 3-4 hours of other meds)  Follow up in 3-4 months   Thank you for choosing [facility]  Kavitha Nandigam,MD

## 2020-02-28 ENCOUNTER — Encounter (INDEPENDENT_AMBULATORY_CARE_PROVIDER_SITE_OTHER): Payer: Self-pay | Admitting: Physician Assistant

## 2020-02-28 ENCOUNTER — Ambulatory Visit (INDEPENDENT_AMBULATORY_CARE_PROVIDER_SITE_OTHER): Payer: BC Managed Care – PPO | Admitting: Physician Assistant

## 2020-02-28 ENCOUNTER — Other Ambulatory Visit: Payer: Self-pay

## 2020-02-28 VITALS — BP 126/85 | HR 72 | Temp 98.2°F | Ht 62.0 in | Wt 261.0 lb

## 2020-02-28 DIAGNOSIS — I10 Essential (primary) hypertension: Secondary | ICD-10-CM | POA: Diagnosis not present

## 2020-02-28 DIAGNOSIS — E66813 Obesity, class 3: Secondary | ICD-10-CM

## 2020-02-28 DIAGNOSIS — B20 Human immunodeficiency virus [HIV] disease: Secondary | ICD-10-CM | POA: Diagnosis not present

## 2020-02-28 DIAGNOSIS — Z6841 Body Mass Index (BMI) 40.0 and over, adult: Secondary | ICD-10-CM

## 2020-02-29 NOTE — Progress Notes (Signed)
Chief Complaint:   OBESITY Autumn Curry is here to discuss her progress with her obesity treatment plan along with follow-up of her obesity related diagnoses. Autumn Curry is on the Category 2 Plan and states she is following her eating plan approximately 0% of the time. Autumn Curry states she is exercising 0 minutes 0 times per week.  Today's visit was #: 40 Starting weight: 253 lbs Starting date: 12/14/2017 Today's weight: 261 lbs Today's date: 02/28/2020 Total lbs lost to date: 0 Total lbs lost since last in-office visit: 0  Interim History: Ceria reports that the last time she was here she felt discouraged and, therefore, took a break. She is ready to get restarted.  Subjective:   Essential hypertension. Doranne is on Tenormin and HCTZ. Blood pressure is controlled.  BP Readings from Last 3 Encounters:  02/28/20 126/85  12/06/19 101/64  11/09/19 106/73   Lab Results  Component Value Date   CREATININE 0.76 10/10/2019   CREATININE 0.84 05/26/2019   CREATININE 0.92 04/26/2019   Human immunodeficiency virus (HIV) disease ([place]). Autumn Curry is followed by Infectious Disease.  Assessment/Plan:   Essential hypertension. Autumn Curry is working on healthy weight loss and exercise to improve blood pressure control. We will watch for signs of hypotension as she continues her lifestyle modifications. She will continue her medications as directed.   Human immunodeficiency virus (HIV) disease ([place]). Autumn Curry will continue her medication as directed and follow-up with Infectious Disease as scheduled and as directed.   Class 3 severe obesity with serious comorbidity and body mass index (BMI) of 45.0 to 49.9 in adult, unspecified obesity type ([place]).  Autumn Curry is currently in the action stage of change. As such, her goal is to continue with weight loss efforts. She has agreed to the Category 2 Plan and will journal 1100-1200 calories and 85 grams of protein daily.   Exercise goals: For  substantial health benefits, adults should do at least 150 minutes (2 hours and 30 minutes) a week of moderate-intensity, or 75 minutes (1 hour and 15 minutes) a week of vigorous-intensity aerobic physical activity, or an equivalent combination of moderate- and vigorous-intensity aerobic activity. Aerobic activity should be performed in episodes of at least 10 minutes, and preferably, it should be spread throughout the week.  Behavioral modification strategies: no skipping meals and planning for success.  Autumn Curry has agreed to follow-up with our clinic in 2 weeks. She was informed of the importance of frequent follow-up visits to maximize her success with intensive lifestyle modifications for her multiple health conditions.   Objective:   Blood pressure 126/85, pulse 72, temperature 98.2 F (36.8 C), temperature source Oral, height 5\' 2"  (1.575 m), weight 261 lb (118.4 kg), last menstrual period 02/18/2020, SpO2 97 %. Body mass index is 47.74 kg/m.  General: Cooperative, alert, well developed, in no acute distress. HEENT: Conjunctivae and lids unremarkable. Cardiovascular: Regular rhythm.  Lungs: Normal work of breathing. Neurologic: No focal deficits.   Lab Results  Component Value Date   CREATININE 0.76 10/10/2019   BUN 12 10/10/2019   NA 139 10/10/2019   K 3.8 10/10/2019   CL 100 10/10/2019   CO2 24 10/10/2019   Lab Results  Component Value Date   ALT 13 10/10/2019   AST 15 10/10/2019   ALKPHOS 80 10/10/2019   BILITOT 0.3 10/10/2019   Lab Results  Component Value Date   HGBA1C 5.6 10/10/2019   HGBA1C 5.6 05/26/2019   HGBA1C 5.7 (H) 02/22/2019   HGBA1C 5.6 09/13/2018  HGBA1C 5.7 (H) 04/22/2018   Lab Results  Component Value Date   INSULIN 30.3 (H) 10/10/2019   INSULIN 60.9 (H) 02/22/2019   INSULIN 24.8 09/13/2018   INSULIN 28.7 (H) 04/22/2018   INSULIN 31.4 (H) 12/14/2017   Lab Results  Component Value Date   TSH 1.89 05/26/2019   Lab Results  Component  Value Date   CHOL 229 (H) 10/10/2019   HDL 40 10/10/2019   LDLCALC 167 (H) 10/10/2019   TRIG 120 10/10/2019   CHOLHDL 6 05/26/2019   Lab Results  Component Value Date   WBC 6.3 05/26/2019   HGB 12.1 05/26/2019   HCT 36.3 05/26/2019   MCV 89.1 05/26/2019   PLT 343.0 05/26/2019   No results found for: IRON, TIBC, FERRITIN  Attestation Statements:   Reviewed by clinician on day of visit: allergies, medications, problem list, medical history, surgical history, family history, social history, and previous encounter notes.  Time spent on visit including pre-visit chart review and post-visit charting and care was 30 minutes.   IMichaelene Song, am acting as transcriptionist for Abby Potash, PA-C   I have reviewed the above documentation for accuracy and completeness, and I agree with the above. Abby Potash, PA-C

## 2020-03-15 ENCOUNTER — Other Ambulatory Visit: Payer: Self-pay

## 2020-03-15 ENCOUNTER — Encounter (INDEPENDENT_AMBULATORY_CARE_PROVIDER_SITE_OTHER): Payer: Self-pay | Admitting: Family Medicine

## 2020-03-15 ENCOUNTER — Ambulatory Visit (INDEPENDENT_AMBULATORY_CARE_PROVIDER_SITE_OTHER): Payer: BC Managed Care – PPO | Admitting: Family Medicine

## 2020-03-15 VITALS — BP 120/76 | HR 87 | Temp 98.2°F | Ht 62.0 in | Wt 261.0 lb

## 2020-03-15 DIAGNOSIS — Z6841 Body Mass Index (BMI) 40.0 and over, adult: Secondary | ICD-10-CM

## 2020-03-15 DIAGNOSIS — R7303 Prediabetes: Secondary | ICD-10-CM

## 2020-03-15 DIAGNOSIS — E7849 Other hyperlipidemia: Secondary | ICD-10-CM | POA: Diagnosis not present

## 2020-03-15 DIAGNOSIS — E559 Vitamin D deficiency, unspecified: Secondary | ICD-10-CM

## 2020-03-15 NOTE — Progress Notes (Signed)
Chief Complaint:   OBESITY Autumn Curry is here to discuss her progress with her obesity treatment plan along with follow-up of her obesity related diagnoses. Autumn Curry is on the Category 2 Plan or keeping a food journal and adhering to recommended goals of 1100-1200 calories and 85 grams of protein daily and states she is following her eating plan approximately 80% of the time. Autumn Curry states she is doing cardio for 25-30 minutes 4 times per week.  Today's visit was #: 70 Starting weight: 253 lbs Starting date: 12/14/2017 Today's weight: 261 lbs Today's date: 03/15/2020 Total lbs lost to date: 0 Total lbs lost since last in-office visit: 0  Interim History: Autumn Curry has not been journaling consistently. When she does journal, she reports that her calories average 1300 per day and protein averages around 85 grams per day. She admits to drinking some caloric beverages ([place] green tea).  Subjective:   1. Pre-diabetes Autumn Curry has a diagnosis of pre-diabetes based on her elevated Hgb A1c and was informed this puts her at greater risk of developing diabetes. She notes some polyphagia when adhering to the plan. She did not tolerate metformin in the past due to diarrhea. She continues to work on diet and exercise to decrease her risk of diabetes.   Lab Results  Component Value Date   HGBA1C 5.6 10/10/2019   Lab Results  Component Value Date   INSULIN 30.3 (H) 10/10/2019   INSULIN 60.9 (H) 02/22/2019   INSULIN 24.8 09/13/2018   INSULIN 28.7 (H) 04/22/2018   INSULIN 31.4 (H) 12/14/2017   2. Other hyperlipidemia Autumn Curry has hyperlipidemia and has been trying to improve her cholesterol levels with intensive lifestyle modification including a low saturated fat diet, exercise and weight loss. She is not on statin, and LDL was elevated at 167. Her HDL was low at 40, and triglycerides within normal limits.   Lab Results  Component Value Date   ALT 13 10/10/2019   AST 15 10/10/2019   ALKPHOS 80  10/10/2019   BILITOT 0.3 10/10/2019   Lab Results  Component Value Date   CHOL 229 (H) 10/10/2019   HDL 40 10/10/2019   LDLCALC 167 (H) 10/10/2019   TRIG 120 10/10/2019   CHOLHDL 6 05/26/2019   3. Vitamin D deficiency Autumn Curry's Vit D is at goal. She is not on Vit D at this point. She forgot to purchase OTC Vit D as advised.  Assessment/Plan:   1. Pre-diabetes Autumn Curry will continue to work on weight loss, exercise, and decreasing simple carbohydrates to help decrease the risk of diabetes. We will check labs today. We discussed Ozempic and she will consider this.   - Comprehensive metabolic panel - Hemoglobin A1c - Insulin, random  2. Other hyperlipidemia Cardiovascular risk and specific lipid/LDL goals reviewed. We discussed several lifestyle modifications today. Autumn Curry will continue to work on diet, exercise and weight loss efforts. We will check labs today.   - Lipid Panel With LDL/HDL Ratio  3. Vitamin D deficiency Check vit D level today. - VITAMIN D 25 Hydroxy (Vit-D Deficiency, Fractures)  4. Class 3 severe obesity with serious comorbidity and body mass index (BMI) of 45.0 to 49.9 in adult, unspecified obesity type ([facility]) Autumn Curry is currently in the action stage of change. As such, her goal is to continue with weight loss efforts. She has agreed to keeping a food journal and adhering to recommended goals of 1100-1200 calories and 85 grams of protein daily.   Exercise goals: As is.  Behavioral  modification strategies: decreasing simple carbohydrates, decreasing liquid calories and keeping a strict food journal.  [name] has agreed to follow-up with our clinic in 2 weeks with Autumn Potash, PA-C.  [name] was informed we would discuss her lab results at her next visit unless there is a critical issue that needs to be addressed sooner. [name] agreed to keep her next visit at the agreed upon time to discuss these results.  Objective:   Blood pressure 120/76, pulse 87,  temperature 98.2 F (36.8 C), temperature source Oral, height 5\' 2"  (1.575 m), weight 261 lb (118.4 kg), last menstrual period 02/18/2020, SpO2 98 %. Body mass index is 47.74 kg/m.  General: Cooperative, alert, well developed, in no acute distress. HEENT: Conjunctivae and lids unremarkable. Cardiovascular: Regular rhythm.  Lungs: Normal work of breathing. Neurologic: No focal deficits.   Lab Results  Component Value Date   CREATININE 0.76 10/10/2019   BUN 12 10/10/2019   NA 139 10/10/2019   K 3.8 10/10/2019   CL 100 10/10/2019   CO2 24 10/10/2019   Lab Results  Component Value Date   ALT 13 10/10/2019   AST 15 10/10/2019   ALKPHOS 80 10/10/2019   BILITOT 0.3 10/10/2019   Lab Results  Component Value Date   HGBA1C 5.6 10/10/2019   HGBA1C 5.6 05/26/2019   HGBA1C 5.7 (H) 02/22/2019   HGBA1C 5.6 09/13/2018   HGBA1C 5.7 (H) 04/22/2018   Lab Results  Component Value Date   INSULIN 30.3 (H) 10/10/2019   INSULIN 60.9 (H) 02/22/2019   INSULIN 24.8 09/13/2018   INSULIN 28.7 (H) 04/22/2018   INSULIN 31.4 (H) 12/14/2017   Lab Results  Component Value Date   TSH 1.89 05/26/2019   Lab Results  Component Value Date   CHOL 229 (H) 10/10/2019   HDL 40 10/10/2019   LDLCALC 167 (H) 10/10/2019   TRIG 120 10/10/2019   CHOLHDL 6 05/26/2019   Lab Results  Component Value Date   WBC 6.3 05/26/2019   HGB 12.1 05/26/2019   HCT 36.3 05/26/2019   MCV 89.1 05/26/2019   PLT 343.0 05/26/2019   No results found for: IRON, TIBC, FERRITIN   Attestation Statements:   Reviewed by clinician on day of visit: allergies, medications, problem list, medical history, surgical history, family history, social history, and previous encounter notes.   Wilhemena Durie, am acting as Location manager for [employer], FNP-C.  I have reviewed the above documentation for accuracy and completeness, and I agree with the above. -  Georgianne Fick, FNP

## 2020-03-16 LAB — COMPREHENSIVE METABOLIC PANEL
ALT: 15 IU/L (ref 0–32)
AST: 13 IU/L (ref 0–40)
Albumin/Globulin Ratio: 1.3 (ref 1.2–2.2)
Albumin: 4 g/dL (ref 3.8–4.8)
Alkaline Phosphatase: 86 IU/L (ref 48–121)
BUN/Creatinine Ratio: 16 (ref 9–23)
BUN: 12 mg/dL (ref 6–20)
Bilirubin Total: 0.3 mg/dL (ref 0.0–1.2)
CO2: 25 mmol/L (ref 20–29)
Calcium: 9.8 mg/dL (ref 8.7–10.2)
Chloride: 98 mmol/L (ref 96–106)
Creatinine, Ser: 0.76 mg/dL (ref 0.57–1.00)
GFR calc Af Amer: 114 mL/min/{1.73_m2} (ref >59)
GFR calc non Af Amer: 99 mL/min/{1.73_m2} (ref >59)
Globulin, Total: 3.2 g/dL (ref 1.5–4.5)
Glucose: 102 mg/dL — ABNORMAL HIGH (ref 65–99)
Potassium: 3.9 mmol/L (ref 3.5–5.2)
Sodium: 138 mmol/L (ref 134–144)
Total Protein: 7.2 g/dL (ref 6.0–8.5)

## 2020-03-16 LAB — LIPID PANEL WITH LDL/HDL RATIO
Cholesterol, Total: 222 mg/dL — ABNORMAL HIGH (ref 100–199)
HDL: 43 mg/dL (ref >39)
LDL Chol Calc (NIH): 158 mg/dL — ABNORMAL HIGH (ref 0–99)
LDL/HDL Ratio: 3.7 ratio — ABNORMAL HIGH (ref 0.0–3.2)
Triglycerides: 116 mg/dL (ref 0–149)
VLDL Cholesterol Cal: 21 mg/dL (ref 5–40)

## 2020-03-16 LAB — HEMOGLOBIN A1C
Est. average glucose Bld gHb Est-mCnc: 117 mg/dL
Hgb A1c MFr Bld: 5.7 % — ABNORMAL HIGH (ref 4.8–5.6)

## 2020-03-16 LAB — INSULIN, RANDOM: INSULIN: 51.3 u[IU]/mL — ABNORMAL HIGH (ref 2.6–24.9)

## 2020-03-16 LAB — VITAMIN D 25 HYDROXY (VIT D DEFICIENCY, FRACTURES): Vit D, 25-Hydroxy: 30.7 ng/mL (ref 30.0–100.0)

## 2020-03-29 ENCOUNTER — Encounter (INDEPENDENT_AMBULATORY_CARE_PROVIDER_SITE_OTHER): Payer: Self-pay | Admitting: Physician Assistant

## 2020-03-29 ENCOUNTER — Other Ambulatory Visit: Payer: Self-pay

## 2020-03-29 ENCOUNTER — Ambulatory Visit (INDEPENDENT_AMBULATORY_CARE_PROVIDER_SITE_OTHER): Payer: BC Managed Care – PPO | Admitting: Physician Assistant

## 2020-03-29 VITALS — BP 121/83 | HR 76 | Temp 98.1°F | Ht 62.0 in | Wt 264.0 lb

## 2020-03-29 DIAGNOSIS — E559 Vitamin D deficiency, unspecified: Secondary | ICD-10-CM

## 2020-03-29 DIAGNOSIS — Z6841 Body Mass Index (BMI) 40.0 and over, adult: Secondary | ICD-10-CM

## 2020-03-29 DIAGNOSIS — R7303 Prediabetes: Secondary | ICD-10-CM | POA: Diagnosis not present

## 2020-03-29 DIAGNOSIS — Z9189 Other specified personal risk factors, not elsewhere classified: Secondary | ICD-10-CM | POA: Diagnosis not present

## 2020-03-29 DIAGNOSIS — E7849 Other hyperlipidemia: Secondary | ICD-10-CM

## 2020-04-01 NOTE — Progress Notes (Signed)
Chief Complaint:   OBESITY Autumn Curry is here to discuss her progress with her obesity treatment plan along with follow-up of her obesity related diagnoses. Autumn Curry is on keeping a food journal and adhering to recommended goals of 1100-1200 calories and 85 grams of protein daily and states she is following her eating plan approximately 80% of the time. Autumn Curry states she is cardio for 25-30 minutes 3 times per week.  Today's visit was #: 35 Starting weight: 253 lbs Starting date: 12/14/2017 Today's weight: 264 lbs Today's date: 03/29/2020 Total lbs lost to date: 0 Total lbs lost since last in-office visit: 0  Interim History: Autumn Curry reports that she has been eating between 1300-1350 calories daily. She is getting about 60-65 grams of protein daily. She is unmotivated and frustrated today.  Subjective:   1. Hyperlipidemia associated with hypertension Autumn Curry is not on any medications for hyperlipidemia. I discussed labs with the patient today.  2. Pre-diabetes Autumn Curry is not on medications currently, and she reports polyphagia. Last A1c was not at goal. I discussed labs with the patient today.  3. Vitamin D deficiency Autumn Curry's last Vit D level was not at goal. I discussed labs with the patient today.  4. At risk for heart disease Autumn Curry is at a higher than average risk for cardiovascular disease due to obesity.   Assessment/Plan:   1. Hyperlipidemia associated with hypertension Cardiovascular risk and specific lipid/LDL goals reviewed. We discussed several lifestyle modifications today. Autumn Curry will continue to work on diet, exercise and weight loss efforts. She will speak with her primary care physician in regards to hyperlipidemia. Orders and follow up as documented in patient record.   Counseling Intensive lifestyle modifications are the first line treatment for this issue.  Dietary changes: Increase soluble fiber. Decrease simple carbohydrates.  Exercise changes:  Moderate to vigorous-intensity aerobic activity 150 minutes per week if tolerated.  Lipid-lowering medications: see documented in medical record.  2. Pre-diabetes Autumn Curry will continue to work on weight loss, exercise, and decreasing simple carbohydrates to help decrease the risk of diabetes.   3. Vitamin D deficiency Low Vitamin D level contributes to fatigue and are associated with obesity, breast, and colon cancer. Autumn Curry agreed to start prescription Vitamin D 50,000 IU every week with no refills. She will follow-up for routine testing of Vitamin D, at least 2-3 times per year to avoid over-replacement.  - Vitamin D, Ergocalciferol, (DRISDOL) 1.25 MG (50000 UNIT) CAPS capsule; Take 1 capsule (50,000 Units total) by mouth every 7 (seven) days.  Dispense: 4 capsule; Refill: 0  4. At risk for heart disease Autumn Curry was given approximately 15 minutes of coronary artery disease prevention counseling today. She is 39 y.o. female and has risk factors for heart disease including obesity. We discussed intensive lifestyle modifications today with an emphasis on specific weight loss instructions and strategies.   Repetitive spaced learning was employed today to elicit superior memory formation and behavioral change.  5. Class 3 severe obesity with serious comorbidity and body mass index (BMI) of 45.0 to 49.9 in adult, unspecified obesity type (HCC) Autumn Curry is currently in the action stage of change. As such, her goal is to continue with weight loss efforts. She has agreed to keeping a food journal and adhering to recommended goals of 1200 calories and 85 grams of protein daily.   We discussed various medication options to help Autumn Curry with her weight loss efforts and we both agreed to start Saxenda 3.0 mg SubQ daily with no refills (she  is to take 0.6 mg daily), and pen needles #100 with no refills.  - Liraglutide -Weight Management (SAXENDA) 18 MG/3ML SOPN; Inject 3 mg into the skin daily.  Dispense:  15 mL; Refill: 0 - Insulin Pen Needle 32G X 4 MM MISC; 1 Package by Does not apply route daily.  Dispense: 100 each; Refill: 0  Exercise goals: As is.  Behavioral modification strategies: meal planning and cooking strategies and keeping healthy foods in the home.  Autumn Curry has agreed to follow-up with our clinic in 2 weeks. She was informed of the importance of frequent follow-up visits to maximize her success with intensive lifestyle modifications for her multiple health conditions.   Objective:   Blood pressure 121/83, pulse 76, temperature 98.1 F (36.7 C), height 5\' 2"  (1.575 m), weight 264 lb (119.7 kg), SpO2 97 %. Body mass index is 48.29 kg/m.  General: Cooperative, alert, well developed, in no acute distress. HEENT: Conjunctivae and lids unremarkable. Cardiovascular: Regular rhythm.  Lungs: Normal work of breathing. Neurologic: No focal deficits.   Lab Results  Component Value Date   CREATININE 0.76 03/15/2020   BUN 12 03/15/2020   NA 138 03/15/2020   K 3.9 03/15/2020   CL 98 03/15/2020   CO2 25 03/15/2020   Lab Results  Component Value Date   ALT 15 03/15/2020   AST 13 03/15/2020   ALKPHOS 86 03/15/2020   BILITOT 0.3 03/15/2020   Lab Results  Component Value Date   HGBA1C 5.7 (H) 03/15/2020   HGBA1C 5.6 10/10/2019   HGBA1C 5.6 05/26/2019   HGBA1C 5.7 (H) 02/22/2019   HGBA1C 5.6 09/13/2018   Lab Results  Component Value Date   INSULIN 51.3 (H) 03/15/2020   INSULIN 30.3 (H) 10/10/2019   INSULIN 60.9 (H) 02/22/2019   INSULIN 24.8 09/13/2018   INSULIN 28.7 (H) 04/22/2018   Lab Results  Component Value Date   TSH 1.89 05/26/2019   Lab Results  Component Value Date   CHOL 222 (H) 03/15/2020   HDL 43 03/15/2020   LDLCALC 158 (H) 03/15/2020   TRIG 116 03/15/2020   CHOLHDL 6 05/26/2019   Lab Results  Component Value Date   WBC 6.3 05/26/2019   HGB 12.1 05/26/2019   HCT 36.3 05/26/2019   MCV 89.1 05/26/2019   PLT 343.0 05/26/2019   No results  found for: IRON, TIBC, FERRITIN  Attestation Statements:   Reviewed by clinician on day of visit: allergies, medications, problem list, medical history, surgical history, family history, social history, and previous encounter notes.   Wilhemena Durie, am acting as transcriptionist for [employer], PA-C.  I have reviewed the above documentation for accuracy and completeness, and I agree with the above. Abby Potash, PA-C

## 2020-04-02 MED ORDER — VITAMIN D (ERGOCALCIFEROL) 1.25 MG (50000 UNIT) PO CAPS: 50000.0000 [IU] | ORAL_CAPSULE | ORAL | 0 refills | Status: DC

## 2020-04-02 MED ORDER — SAXENDA 18 MG/3ML ~~LOC~~ SOPN: 3.0000 mg | PEN_INJECTOR | Freq: Every day | SUBCUTANEOUS | 0 refills | Status: DC

## 2020-04-02 MED ORDER — INSULIN PEN NEEDLE 32G X 4 MM MISC: 1.0000 | Freq: Every day | 0 refills | Status: DC

## 2020-04-09 ENCOUNTER — Encounter (INDEPENDENT_AMBULATORY_CARE_PROVIDER_SITE_OTHER): Payer: Self-pay

## 2020-04-12 ENCOUNTER — Ambulatory Visit (INDEPENDENT_AMBULATORY_CARE_PROVIDER_SITE_OTHER): Payer: BC Managed Care – PPO | Admitting: Physician Assistant

## 2020-04-12 ENCOUNTER — Other Ambulatory Visit: Payer: Self-pay

## 2020-04-12 ENCOUNTER — Encounter (INDEPENDENT_AMBULATORY_CARE_PROVIDER_SITE_OTHER): Payer: Self-pay | Admitting: Physician Assistant

## 2020-04-12 VITALS — BP 131/81 | HR 81 | Temp 98.0°F | Ht 62.0 in | Wt 261.0 lb

## 2020-04-12 DIAGNOSIS — R7303 Prediabetes: Secondary | ICD-10-CM | POA: Diagnosis not present

## 2020-04-12 DIAGNOSIS — I10 Essential (primary) hypertension: Secondary | ICD-10-CM | POA: Diagnosis not present

## 2020-04-12 DIAGNOSIS — Z6841 Body Mass Index (BMI) 40.0 and over, adult: Secondary | ICD-10-CM | POA: Diagnosis not present

## 2020-04-12 NOTE — Progress Notes (Signed)
Chief Complaint:   OBESITY Autumn Curry is here to discuss her progress with her obesity treatment plan along with follow-up of her obesity related diagnoses. Autumn Curry is on keeping a food journal and adhering to recommended goals of 1200 calories and 85 grams of protein daily and states she is following her eating plan approximately 85% of the time. Autumn Curry states she is doing 0 minutes 0 times per week.  Today's visit was #: 17 Starting weight: 253 lbs Starting date: 12/14/2017 Today's weight: 261 lbs Today's date: 04/12/2020 Total lbs lost to date: 0 Total lbs lost since last in-office visit: 3  Interim History: Autumn Curry prepares her breakfast and lunch daily, and meal plans her dinner. She has not yet started her Saxenda. When journaling, she is getting around 1300 calories and 85-90 grams of protein daily.  Subjective:   1. Pre-diabetes Autumn Curry's last A1c was 5.7 and she denies polyphagia or cravings.  2. Essential hypertension Autumn Curry is on hydrochlorothiazide, and her blood pressure is controlled.  Assessment/Plan:   1. Pre-diabetes Autumn Curry will continue to work on weight loss, exercise, and decreasing simple carbohydrates to help decrease the risk of diabetes.   2. Essential hypertension Autumn Curry will continue her medications, and will continue working on healthy weight loss and exercise to improve blood pressure control. We will watch for signs of hypotension as she continues her lifestyle modifications.  3. Class 3 severe obesity with serious comorbidity and body mass index (BMI) of 45.0 to 49.9 in adult, unspecified obesity type (HCC) Autumn Curry is currently in the action stage of change. As such, her goal is to continue with weight loss efforts. She has agreed to keeping a food journal and adhering to recommended goals of 1200 calories and 85 grams of protein daily.   Exercise goals: No exercise has been prescribed at this time.  Behavioral modification strategies: meal  planning and cooking strategies and keeping healthy foods in the home.  Autumn Curry has agreed to follow-up with our clinic in 2 weeks. She was informed of the importance of frequent follow-up visits to maximize her success with intensive lifestyle modifications for her multiple health conditions.   Objective:   Blood pressure 131/81, pulse 81, temperature 98 F (36.7 C), height 5\' 2"  (1.575 m), weight 261 lb (118.4 kg), SpO2 98 %. Body mass index is 47.74 kg/m.  General: Cooperative, alert, well developed, in no acute distress. HEENT: Conjunctivae and lids unremarkable. Cardiovascular: Regular rhythm.  Lungs: Normal work of breathing. Neurologic: No focal deficits.   Lab Results  Component Value Date   CREATININE 0.76 03/15/2020   BUN 12 03/15/2020   NA 138 03/15/2020   K 3.9 03/15/2020   CL 98 03/15/2020   CO2 25 03/15/2020   Lab Results  Component Value Date   ALT 15 03/15/2020   AST 13 03/15/2020   ALKPHOS 86 03/15/2020   BILITOT 0.3 03/15/2020   Lab Results  Component Value Date   HGBA1C 5.7 (H) 03/15/2020   HGBA1C 5.6 10/10/2019   HGBA1C 5.6 05/26/2019   HGBA1C 5.7 (H) 02/22/2019   HGBA1C 5.6 09/13/2018   Lab Results  Component Value Date   INSULIN 51.3 (H) 03/15/2020   INSULIN 30.3 (H) 10/10/2019   INSULIN 60.9 (H) 02/22/2019   INSULIN 24.8 09/13/2018   INSULIN 28.7 (H) 04/22/2018   Lab Results  Component Value Date   TSH 1.89 05/26/2019   Lab Results  Component Value Date   CHOL 222 (H) 03/15/2020   HDL 43 03/15/2020  LDLCALC 158 (H) 03/15/2020   TRIG 116 03/15/2020   CHOLHDL 6 05/26/2019   Lab Results  Component Value Date   WBC 6.3 05/26/2019   HGB 12.1 05/26/2019   HCT 36.3 05/26/2019   MCV 89.1 05/26/2019   PLT 343.0 05/26/2019   No results found for: IRON, TIBC, FERRITIN  Attestation Statements:   Reviewed by clinician on day of visit: allergies, medications, problem list, medical history, surgical history, family history, social  history, and previous encounter notes.  Time spent on visit including pre-visit chart review and post-visit care and charting was 30 minutes.    Wilhemena Durie, am acting as transcriptionist for [employer], PA-C.  I have reviewed the above documentation for accuracy and completeness, and I agree with the above. Abby Potash, PA-C

## 2020-04-26 ENCOUNTER — Encounter (INDEPENDENT_AMBULATORY_CARE_PROVIDER_SITE_OTHER): Payer: Self-pay | Admitting: Physician Assistant

## 2020-04-26 ENCOUNTER — Other Ambulatory Visit: Payer: Self-pay

## 2020-04-26 ENCOUNTER — Ambulatory Visit (INDEPENDENT_AMBULATORY_CARE_PROVIDER_SITE_OTHER): Payer: BC Managed Care – PPO | Admitting: Physician Assistant

## 2020-04-26 VITALS — BP 110/61 | HR 82 | Temp 98.1°F | Ht 62.0 in | Wt 261.0 lb

## 2020-04-26 DIAGNOSIS — Z9189 Other specified personal risk factors, not elsewhere classified: Secondary | ICD-10-CM | POA: Diagnosis not present

## 2020-04-26 DIAGNOSIS — R7303 Prediabetes: Secondary | ICD-10-CM | POA: Diagnosis not present

## 2020-04-26 DIAGNOSIS — E559 Vitamin D deficiency, unspecified: Secondary | ICD-10-CM | POA: Diagnosis not present

## 2020-04-26 DIAGNOSIS — Z6841 Body Mass Index (BMI) 40.0 and over, adult: Secondary | ICD-10-CM | POA: Diagnosis not present

## 2020-04-26 MED ORDER — VITAMIN D (ERGOCALCIFEROL) 1.25 MG (50000 UNIT) PO CAPS: 50000.0000 [IU] | ORAL_CAPSULE | ORAL | 0 refills | Status: DC

## 2020-04-30 NOTE — Progress Notes (Signed)
Chief Complaint:   OBESITY Autumn Curry is here to discuss her progress with her obesity treatment plan along with follow-up of her obesity related diagnoses. Autumn Curry is on the Category 2 Plan and states she is following her eating plan approximately 80% of the time. Autumn Curry states she walks at work for exercise.   Today's visit was #: 51 Starting weight: 253 lbs Starting date: 12/14/2017 Today's weight: 261 lbs Today's date: 04/26/2020 Total lbs lost to date: 0 Total lbs lost since last in-office visit: 0  Interim History: Autumn Curry notes that she has been eating out more often. She started [place] 5 days ago and is tolerating it well. She is going to be prepping her food more often and cooking at home.  Subjective:   Vitamin D deficiency. Autumn Curry is on prescription Vitamin D weekly, which she is tolerating well.   Ref. Range 03/15/2020 07:58  Vitamin D, 25-Hydroxy Latest Ref Range: 30.0 - 100.0 ng/mL 30.7   Pre-diabetes. Autumn Curry has a diagnosis of prediabetes based on her elevated HgA1c and was informed this puts her at greater risk of developing diabetes. She continues to work on diet and exercise to decrease her risk of diabetes. She denies nausea or hypoglycemia. Autumn Curry is on no medication. She does not tolerate metformin.  Lab Results  Component Value Date   HGBA1C 5.7 (H) 03/15/2020   Lab Results  Component Value Date   INSULIN 51.3 (H) 03/15/2020   INSULIN 30.3 (H) 10/10/2019   INSULIN 60.9 (H) 02/22/2019   INSULIN 24.8 09/13/2018   INSULIN 28.7 (H) 04/22/2018   At risk for diabetes mellitus. Autumn Curry is at higher than average risk for developing diabetes due to her obesity.   Assessment/Plan:   Vitamin D deficiency. Low Vitamin D level contributes to fatigue and are associated with obesity, breast, and colon cancer. She was given a refill on her Vitamin D, Ergocalciferol, (DRISDOL) 1.25 MG (50000 UNIT) CAPS capsule every week #4 with 0 refills and will  follow-up for routine testing of Vitamin D, at least 2-3 times per year to avoid over-replacement.   Pre-diabetes. Autumn Curry will continue to work on weight loss, exercise, and decreasing simple carbohydrates to help decrease the risk of diabetes.   At risk for diabetes mellitus. Autumn Curry was given approximately 15 minutes of diabetes education and counseling today. We discussed intensive lifestyle modifications today with an emphasis on weight loss as well as increasing exercise and decreasing simple carbohydrates in her diet. We also reviewed medication options with an emphasis on risk versus benefit of those discussed.   Repetitive spaced learning was employed today to elicit superior memory formation and behavioral change.  Class 3 severe obesity with serious comorbidity and body mass index (BMI) of 45.0 to 49.9 in adult, unspecified obesity type (Autumn Curry).  Autumn Curry is currently in the action stage of change. As such, her goal is to continue with weight loss efforts. She has agreed to the Category 2 Plan.   Exercise goals: For substantial health benefits, adults should do at least 150 minutes (2 hours and 30 minutes) a week of moderate-intensity, or 75 minutes (1 hour and 15 minutes) a week of vigorous-intensity aerobic physical activity, or an equivalent combination of moderate- and vigorous-intensity aerobic activity. Aerobic activity should be performed in episodes of at least 10 minutes, and preferably, it should be spread throughout the week.  Behavioral modification strategies: decreasing eating out and meal planning and cooking strategies.  Autumn Curry has agreed to follow-up with our  clinic in 2 weeks. She was informed of the importance of frequent follow-up visits to maximize her success with intensive lifestyle modifications for her multiple health conditions.   Objective:   Blood pressure 110/61, pulse 82, temperature 98.1 F (36.7 C), height 5\' 2"  (1.575 m), weight 261 lb (118.4 kg), SpO2  97 %. Body mass index is 47.74 kg/m.  General: Cooperative, alert, well developed, in no acute distress. HEENT: Conjunctivae and lids unremarkable. Cardiovascular: Regular rhythm.  Lungs: Normal work of breathing. Neurologic: No focal deficits.   Lab Results  Component Value Date   CREATININE 0.76 03/15/2020   BUN 12 03/15/2020   NA 138 03/15/2020   K 3.9 03/15/2020   CL 98 03/15/2020   CO2 25 03/15/2020   Lab Results  Component Value Date   ALT 15 03/15/2020   AST 13 03/15/2020   ALKPHOS 86 03/15/2020   BILITOT 0.3 03/15/2020   Lab Results  Component Value Date   HGBA1C 5.7 (H) 03/15/2020   HGBA1C 5.6 10/10/2019   HGBA1C 5.6 05/26/2019   HGBA1C 5.7 (H) 02/22/2019   HGBA1C 5.6 09/13/2018   Lab Results  Component Value Date   INSULIN 51.3 (H) 03/15/2020   INSULIN 30.3 (H) 10/10/2019   INSULIN 60.9 (H) 02/22/2019   INSULIN 24.8 09/13/2018   INSULIN 28.7 (H) 04/22/2018   Lab Results  Component Value Date   TSH 1.89 05/26/2019   Lab Results  Component Value Date   CHOL 222 (H) 03/15/2020   HDL 43 03/15/2020   LDLCALC 158 (H) 03/15/2020   TRIG 116 03/15/2020   CHOLHDL 6 05/26/2019   Lab Results  Component Value Date   WBC 6.3 05/26/2019   HGB 12.1 05/26/2019   HCT 36.3 05/26/2019   MCV 89.1 05/26/2019   PLT 343.0 05/26/2019   No results found for: IRON, TIBC, FERRITIN  Attestation Statements:   Reviewed by clinician on day of visit: allergies, medications, problem list, medical history, surgical history, family history, social history, and previous encounter notes.  IMichaelene Song, am acting as transcriptionist for Abby Potash, PA-C   I have reviewed the above documentation for accuracy and completeness, and I agree with the above. Abby Potash, PA-C

## 2020-05-01 ENCOUNTER — Other Ambulatory Visit: Payer: Self-pay | Admitting: Family Medicine

## 2020-05-01 DIAGNOSIS — I10 Essential (primary) hypertension: Secondary | ICD-10-CM

## 2020-05-07 ENCOUNTER — Other Ambulatory Visit: Payer: BC Managed Care – PPO

## 2020-05-07 ENCOUNTER — Other Ambulatory Visit: Payer: Self-pay

## 2020-05-07 DIAGNOSIS — B20 Human immunodeficiency virus [HIV] disease: Secondary | ICD-10-CM

## 2020-05-08 LAB — T-HELPER CELL (CD4) - (RCID CLINIC ONLY)
CD4 % Helper T Cell: 37 % (ref 33–65)
CD4 T Cell Abs: 1053 /uL (ref 400–1790)

## 2020-05-09 ENCOUNTER — Ambulatory Visit (INDEPENDENT_AMBULATORY_CARE_PROVIDER_SITE_OTHER): Payer: BC Managed Care – PPO | Admitting: Family Medicine

## 2020-05-09 ENCOUNTER — Other Ambulatory Visit: Payer: Self-pay

## 2020-05-09 ENCOUNTER — Encounter (INDEPENDENT_AMBULATORY_CARE_PROVIDER_SITE_OTHER): Payer: Self-pay | Admitting: Family Medicine

## 2020-05-09 VITALS — BP 117/75 | HR 74 | Temp 98.1°F | Ht 62.0 in | Wt 262.0 lb

## 2020-05-09 DIAGNOSIS — I10 Essential (primary) hypertension: Secondary | ICD-10-CM

## 2020-05-09 DIAGNOSIS — Z6841 Body Mass Index (BMI) 40.0 and over, adult: Secondary | ICD-10-CM

## 2020-05-09 LAB — CBC
HCT: 38 % (ref 35.0–45.0)
Hemoglobin: 12.7 g/dL (ref 11.7–15.5)
MCH: 30.2 pg (ref 27.0–33.0)
MCHC: 33.4 g/dL (ref 32.0–36.0)
MCV: 90.3 fL (ref 80.0–100.0)
MPV: 10.2 fL (ref 7.5–12.5)
Platelets: 339 10*3/uL (ref 140–400)
RBC: 4.21 10*6/uL (ref 3.80–5.10)
RDW: 12.5 % (ref 11.0–15.0)
WBC: 7.3 10*3/uL (ref 3.8–10.8)

## 2020-05-09 LAB — HIV-1 RNA QUANT-NO REFLEX-BLD
HIV 1 RNA Quant: <20 Copies/mL — ABNORMAL HIGH
HIV-1 RNA Quant, Log: <1.3 Log cps/mL — ABNORMAL HIGH

## 2020-05-09 LAB — LIPID PANEL
Cholesterol: 223 mg/dL — ABNORMAL HIGH (ref <200)
HDL: 43 mg/dL — ABNORMAL LOW (ref >=50)
LDL Cholesterol (Calc): 156 mg/dL (calc) — ABNORMAL HIGH
Non-HDL Cholesterol (Calc): 180 mg/dL (calc) — ABNORMAL HIGH (ref <130)
Total CHOL/HDL Ratio: 5.2 (calc) — ABNORMAL HIGH (ref <5.0)
Triglycerides: 121 mg/dL (ref <150)

## 2020-05-09 LAB — RPR: RPR Ser Ql: NONREACTIVE

## 2020-05-10 ENCOUNTER — Encounter (INDEPENDENT_AMBULATORY_CARE_PROVIDER_SITE_OTHER): Payer: Self-pay | Admitting: Family Medicine

## 2020-05-10 NOTE — Progress Notes (Signed)
Chief Complaint:   OBESITY Autumn Curry is here to discuss her progress with her obesity treatment plan along with follow-up of her obesity related diagnoses. Autumn Curry is on the Category 2 Plan and states she is following her eating plan approximately 75-80% of the time. Autumn Curry states she is doing 0 minutes 0 times per week.  Today's visit was #: 14 Starting weight: 253 lbs Starting date: 12/14/2017 Today's weight: 262 lbs Today's date: 05/09/2020 Total lbs lost to date: 0 Total lbs lost since last in-office visit: 0 (+2)  Interim History: Autumn Curry's Saxenda dose was increased to 1.2 mg at her last office visit, and did not feel "right" so she reduced dose to 0.9 mg. She notes mild constipation. She feels it is helping with her appetite. She is on the plan for breakfast and lunch. She is eating out at dinner. She is headed to an all inclusive resort in [place] tomorrow for 4 days. She is up 2 lbs today.  Subjective:   1. Essential hypertension [name]'s blood pressure is well controlled on atenolol. Cardiovascular ROS: no chest pain or dyspnea on exertion.  BP Readings from Last 3 Encounters:  05/09/20 117/75  04/26/20 110/61  04/12/20 131/81   Lab Results  Component Value Date   CREATININE 0.76 03/15/2020   CREATININE 0.76 10/10/2019   CREATININE 0.84 05/26/2019   Assessment/Plan:   1. Essential hypertension [name] will continue atenolol.  2. Class 3 severe obesity with serious comorbidity and body mass index (BMI) of 45.0 to 49.9 in adult, unspecified obesity type (HCC) Autumn Curry is currently in the action stage of change. As such, her goal is to continue with weight loss efforts. She has agreed to the Category 2 Plan.   Autumn Curry may increase Saxenda to 1.2 mg.  Exercise goals: Encouraged her to begin some form of aerobic exercise.  Behavioral modification strategies: increasing lean protein intake and travel eating strategies.  Autumn Curry has agreed to follow-up with our  clinic in 3 weeks with Autumn Potash, PA-C.  Objective:   Blood pressure 117/75, pulse 74, temperature 98.1 F (36.7 C), height 5\' 2"  (1.575 m), weight 262 lb (118.8 kg), SpO2 98 %. Body mass index is 47.92 kg/m.  General: Cooperative, alert, well developed, in no acute distress. HEENT: Conjunctivae and lids unremarkable. Cardiovascular: Regular rhythm.  Lungs: Normal work of breathing. Neurologic: No focal deficits.   Lab Results  Component Value Date   CREATININE 0.76 03/15/2020   BUN 12 03/15/2020   NA 138 03/15/2020   K 3.9 03/15/2020   CL 98 03/15/2020   CO2 25 03/15/2020   Lab Results  Component Value Date   ALT 15 03/15/2020   AST 13 03/15/2020   ALKPHOS 86 03/15/2020   BILITOT 0.3 03/15/2020   Lab Results  Component Value Date   HGBA1C 5.7 (H) 03/15/2020   HGBA1C 5.6 10/10/2019   HGBA1C 5.6 05/26/2019   HGBA1C 5.7 (H) 02/22/2019   HGBA1C 5.6 09/13/2018   Lab Results  Component Value Date   INSULIN 51.3 (H) 03/15/2020   INSULIN 30.3 (H) 10/10/2019   INSULIN 60.9 (H) 02/22/2019   INSULIN 24.8 09/13/2018   INSULIN 28.7 (H) 04/22/2018   Lab Results  Component Value Date   TSH 1.89 05/26/2019   Lab Results  Component Value Date   CHOL 223 (H) 05/07/2020   HDL 43 (L) 05/07/2020   LDLCALC 156 (H) 05/07/2020   TRIG 121 05/07/2020   CHOLHDL 5.2 (H) 05/07/2020   Lab Results  Component Value Date   WBC 7.3 05/07/2020   HGB 12.7 05/07/2020   HCT 38.0 05/07/2020   MCV 90.3 05/07/2020   PLT 339 05/07/2020   No results found for: IRON, TIBC, FERRITIN  Attestation Statements:   Reviewed by clinician on day of visit: allergies, medications, problem list, medical history, surgical history, family history, social history, and previous encounter notes.   Autumn Curry, am acting as Location manager for [employer], FNP-C.  I have reviewed the above documentation for accuracy and completeness, and I agree with the above. -  Georgianne Fick,  FNP

## 2020-05-21 ENCOUNTER — Encounter: Payer: Managed Care, Other (non HMO) | Admitting: Internal Medicine

## 2020-05-23 ENCOUNTER — Encounter: Payer: Self-pay | Admitting: Internal Medicine

## 2020-05-23 ENCOUNTER — Ambulatory Visit: Payer: BC Managed Care – PPO | Admitting: Internal Medicine

## 2020-05-23 ENCOUNTER — Other Ambulatory Visit: Payer: Self-pay

## 2020-05-23 VITALS — BP 129/87 | HR 83 | Temp 98.3°F | Ht 62.0 in | Wt 266.0 lb

## 2020-05-23 DIAGNOSIS — B20 Human immunodeficiency virus [HIV] disease: Secondary | ICD-10-CM | POA: Diagnosis not present

## 2020-05-23 DIAGNOSIS — Z23 Encounter for immunization: Secondary | ICD-10-CM

## 2020-05-23 MED ORDER — SYMTUZA 800-150-200-10 MG PO TABS: 1.0000 | ORAL_TABLET | Freq: Every day | ORAL | 11 refills | Status: DC

## 2020-05-23 NOTE — Progress Notes (Signed)
[facility]  Patient Active Problem List   Diagnosis Date Noted  . Human immunodeficiency virus (HIV) disease ([place]) 09/03/2006    Priority: High  . Prediabetes 09/15/2019  . Vitamin D deficiency 09/15/2019  . Class 3 severe obesity with serious comorbidity and body mass index (BMI) of 45.0 to 49.9 in adult ([place]) 12/23/2018  . 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 Hyperlipidemia 01/26/2018  . Shortness of breath on exertion 12/14/2017  . Essential hypertension 12/14/2017  . Preventative health care 08/18/2016  . Genital herpes 02/16/2012  . 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 BOILS, RECURRENT 08/31/2007  . HEADACHE 08/31/2007  . SYPHILIS NOS 09/11/2006    Patient's Medications  New Prescriptions   No medications on file  Previous Medications   ASPIRIN-ACETAMINOPHEN-CAFFEINE (EXCEDRIN MIGRAINE) 250-250-65 MG TABLET    Take by mouth every 6 (six) hours as needed for headache.   ATENOLOL (TENORMIN) 25 MG TABLET    Take 1 tablet by mouth once daily   HYDROCHLOROTHIAZIDE (HYDRODIURIL) 25 MG TABLET    TAKE 1 TABLET BY MOUTH ONCE DAILY **  MUST  HAVE  OFFICE  VISIT  BEFORE  FURTHER  REFILLS**   IBUPROFEN 200 MG CAPS    Take 200 mg by mouth every 6 (six) hours as needed.    INSULIN PEN NEEDLE 32G X 4 MM MISC    1 Package by Does not apply route daily.   LIRAGLUTIDE -WEIGHT MANAGEMENT (SAXENDA) 18 MG/3ML SOPN    Inject 3 mg into the skin daily.   LORATADINE (CLARITIN) 10 MG TABLET    Take 10 mg by mouth daily.   MULTIPLE VITAMINS-MINERALS (ALIVE WOMENS ENERGY) TABS    Take 1 tablet by mouth daily.   VALACYCLOVIR (VALTREX) 500 MG TABLET    Take 1 tablet (500 mg total) by mouth 2 (two) times daily.   VITAMIN D, ERGOCALCIFEROL, (DRISDOL) 1.25 MG (50000 UNIT) CAPS CAPSULE    Take 1 capsule (50,000 Units total) by mouth every 7 (seven) days.  Modified Medications   Modified Medication Previous Medication   DARUNAVIR-COBICISCTAT-EMTRICITABINE-TENOFOVIR ALAFENAMIDE  (SYMTUZA) 800-150-200-10 MG TABS Darunavir-Cobicisctat-Emtricitabine-Tenofovir Alafenamide (SYMTUZA) 800-150-200-10 MG TABS      Take 1 tablet by mouth daily with breakfast.    Take 1 tablet by mouth daily with breakfast.  Discontinued Medications   No medications on file    Subjective: Autumn Curry is in for her routine follow-up visit. She denies any problems obtaining, taking or tolerating her Symtuza and does not recall missing any dosages. She has received her first 2 Covid vaccines and is not due for her booster yet. She is back working in the office now. She recently went on a vacation to [place], [place]. She says that that got her out of getting regular exercise. She is not feeling anxious or depressed.  Review of Systems: Review of Systems  Constitutional: Negative for fever, malaise/fatigue and weight loss.  Psychiatric/Behavioral: Negative for depression. The patient is not nervous/anxious.     Past Medical History:  Diagnosis Date  . Absence of menstruation   . Carbuncle and furuncle of unspecified site   . Elevated blood pressure reading without diagnosis of hypertension   . Encounter for long-term (current) use of other medications   . Headache(784.0)   . 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 Hyperlipidemia   . Hypertension   . Routine general medical examination at a health care facility   . Screening for malignant neoplasm of  the cervix   . Syphilis, unspecified     Social History   Tobacco Use  . Smoking status: Never Smoker  . Smokeless tobacco: Never Used  Vaping Use  . Vaping Use: Never used  Substance Use Topics  . Alcohol use: Yes    Alcohol/week: 0.0 standard drinks    Comment: rarely  . Drug use: No    Family History  Problem Relation Age of Onset  . Hyperlipidemia Mother   . Hypertension Mother   . Depression Mother   . Coronary artery disease Father   . Diabetes Father   . Heart disease Father   . Hyperlipidemia Father   . Stroke Father   .  Kidney disease Father   . Colon cancer Neg Hx   . Esophageal cancer Neg Hx   . Colon polyps Neg Hx   . Pancreatic cancer Neg Hx   . Stomach cancer Neg Hx     No Known Allergies  Objective: Vitals:   05/23/20 1038  BP: 129/87  Pulse: 83  Temp: 98.3 F (36.8 C)  TempSrc: Oral  SpO2: 98%  Weight: 266 lb (120.7 kg)  Height: 5\' 2"  (1.575 m)   Body mass index is 48.65 kg/m.  Physical Exam Constitutional:      Comments: Her weight is unchanged.  Cardiovascular:     Rate and Rhythm: Normal rate and regular rhythm.     Heart sounds: No murmur heard.   Pulmonary:     Effort: Pulmonary effort is normal.     Breath sounds: Normal breath sounds.  Psychiatric:        Mood and Affect: Mood normal.     Lab Results HIV 1 RNA Quant  Date Value  05/07/2020 <20 Copies/mL (H)  04/26/2019 <20 NOT DETECTED copies/mL  04/07/2018 <20 NOT DETECTED copies/mL   CD4 T Cell Abs (/uL)  Date Value  05/07/2020 1,053  04/26/2019 1,111  04/07/2018 1,180     Problem List Items Addressed This Visit      High   Human immunodeficiency virus (HIV) disease ([place])    Her infection remains under excellent, long-term control. She received her influenza vaccine here today. She will continue Symtuza and follow-up after lab work in 1 year.      Relevant Medications   Darunavir-Cobicisctat-Emtricitabine-Tenofovir Alafenamide (SYMTUZA) 800-150-200-10 MG TABS   Other Relevant Orders   CBC   T-helper cell (CD4)- ([facility] clinic only)   Comprehensive metabolic panel   Lipid panel   RPR   HIV-1 RNA quant-no reflex-bld       Michel Bickers, MD [facility] [place] [phone] pager   [phone] cell 05/23/2020, 11:04 AM

## 2020-05-23 NOTE — Assessment & Plan Note (Signed)
Her infection remains under excellent, long-term control. She received her influenza vaccine here today. She will continue Symtuza and follow-up after lab work in 1 year.

## 2020-05-24 DIAGNOSIS — Z20822 Contact with and (suspected) exposure to covid-19: Secondary | ICD-10-CM | POA: Diagnosis not present

## 2020-05-30 ENCOUNTER — Ambulatory Visit (INDEPENDENT_AMBULATORY_CARE_PROVIDER_SITE_OTHER): Payer: Self-pay | Admitting: Physician Assistant

## 2020-05-30 ENCOUNTER — Other Ambulatory Visit: Payer: Self-pay | Admitting: Internal Medicine

## 2020-05-30 DIAGNOSIS — B20 Human immunodeficiency virus [HIV] disease: Secondary | ICD-10-CM

## 2020-06-01 ENCOUNTER — Encounter: Payer: Self-pay | Admitting: Family Medicine

## 2020-06-01 ENCOUNTER — Ambulatory Visit (INDEPENDENT_AMBULATORY_CARE_PROVIDER_SITE_OTHER): Payer: BC Managed Care – PPO | Admitting: Family Medicine

## 2020-06-01 ENCOUNTER — Other Ambulatory Visit: Payer: Self-pay

## 2020-06-01 VITALS — BP 124/86 | HR 81 | Temp 98.2°F | Resp 18 | Ht 62.0 in | Wt 266.8 lb

## 2020-06-01 DIAGNOSIS — Z0000 Encounter for general adult medical examination without abnormal findings: Secondary | ICD-10-CM

## 2020-06-01 DIAGNOSIS — Z6841 Body Mass Index (BMI) 40.0 and over, adult: Secondary | ICD-10-CM

## 2020-06-01 DIAGNOSIS — Z1231 Encounter for screening mammogram for malignant neoplasm of breast: Secondary | ICD-10-CM | POA: Diagnosis not present

## 2020-06-01 DIAGNOSIS — B20 Human immunodeficiency virus [HIV] disease: Secondary | ICD-10-CM | POA: Diagnosis not present

## 2020-06-01 DIAGNOSIS — I10 Essential (primary) hypertension: Secondary | ICD-10-CM | POA: Diagnosis not present

## 2020-06-01 DIAGNOSIS — E8881 Metabolic syndrome: Secondary | ICD-10-CM

## 2020-06-01 DIAGNOSIS — E785 Hyperlipidemia, unspecified: Secondary | ICD-10-CM

## 2020-06-01 MED ORDER — HYDROCHLOROTHIAZIDE 25 MG PO TABS: ORAL_TABLET | ORAL | 3 refills | Status: DC

## 2020-06-01 MED ORDER — ATENOLOL 25 MG PO TABS: 25.0000 mg | ORAL_TABLET | Freq: Every day | ORAL | 3 refills | Status: DC

## 2020-06-01 NOTE — Assessment & Plan Note (Signed)
ghm utd Check labs con't healthy weight and wellness

## 2020-06-01 NOTE — Assessment & Plan Note (Signed)
Encouraged heart healthy diet, increase exercise, avoid trans fats, consider a krill oil cap daily Lab Results  Component Value Date   CHOL 223 (H) 05/07/2020   HDL 43 (L) 05/07/2020   LDLCALC 156 (H) 05/07/2020   TRIG 121 05/07/2020   CHOLHDL 5.2 (H) 05/07/2020

## 2020-06-01 NOTE — Assessment & Plan Note (Signed)
Well controlled, no changes to meds. Encouraged heart healthy diet such as the DASH diet and exercise as tolerated.  °

## 2020-06-01 NOTE — Assessment & Plan Note (Signed)
Per ID 

## 2020-06-01 NOTE — Progress Notes (Signed)
Subjective:     Autumn Curry is a 39 y.o. female and is here for a comprehensive physical exam. The patient reports no new problems . Pt seeing healthy weight and wellness for weight loss and ID for Hiv.     Social History   Socioeconomic History  . Marital status: Married    Spouse name: Cozette Braggs  . Number of children: 1  . Years of education: Not on file  . Highest education level: Not on file  Occupational History  . Occupation: Pharmacist, hospital    Comment: head start   Tobacco Use  . Smoking status: Never Smoker  . Smokeless tobacco: Never Used  Vaping Use  . Vaping Use: Never used  Substance and Sexual Activity  . Alcohol use: Yes    Alcohol/week: 0.0 standard drinks    Comment: rarely  . Drug use: No  . Sexual activity: Yes    Partners: Male    Birth control/protection: Condom    Comment: given condoms 05/2020  Other Topics Concern  . Not on file  Social History Narrative  . Not on file   Social Determinants of Health   Financial Resource Strain:   . Difficulty of Paying Living Expenses: Not on file  Food Insecurity:   . Worried About Charity fundraiser in the Last Year: Not on file  . Ran Out of Food in the Last Year: Not on file  Transportation Needs:   . Lack of Transportation (Medical): Not on file  . Lack of Transportation (Non-Medical): Not on file  Physical Activity:   . Days of Exercise per Week: Not on file  . Minutes of Exercise per Session: Not on file  Stress:   . Feeling of Stress : Not on file  Social Connections:   . Frequency of Communication with Friends and Family: Not on file  . Frequency of Social Gatherings with Friends and Family: Not on file  . Attends Religious Services: Not on file  . Active Member of Clubs or Organizations: Not on file  . Attends Archivist Meetings: Not on file  . Marital Status: Not on file  Intimate Partner Violence:   . Fear of Current or Ex-Partner: Not on file  . Emotionally Abused: Not on  file  . Physically Abused: Not on file  . Sexually Abused: Not on file   Health Maintenance  Topic Date Due  . URINE MICROALBUMIN  Never done  . PAP SMEAR-Modifier  05/25/2020  . TETANUS/TDAP  07/11/2026  . INFLUENZA VACCINE  Completed  . COVID-19 Vaccine  Completed  . Hepatitis C Screening  Completed  . HIV Screening  Completed    The following portions of the patient's history were reviewed and updated as appropriate:  She  has a past medical history of Absence of menstruation, Carbuncle and furuncle of unspecified site, Elevated blood pressure reading without diagnosis of hypertension, Encounter for long-term (current) use of other medications, Headache(784.0), HIV infection ([place]) (2008), Hyperlipidemia, Hypertension, Routine general medical examination at a health care facility, Screening for malignant neoplasm of the cervix, and Syphilis, unspecified. She does not have any pertinent problems on file. She  has a past surgical history that includes No past surgeries. Her family history includes Coronary artery disease in her father; Depression in her mother; Diabetes in her father; Heart disease in her father; Hyperlipidemia in her father and mother; Hypertension in her mother; Kidney disease in her father; Stroke in her father. She  reports that she  has never smoked. She has never used smokeless tobacco. She reports current alcohol use. She reports that she does not use drugs. She has a current medication list which includes the following prescription(s): aspirin-acetaminophen-caffeine, atenolol, hydrochlorothiazide, ibuprofen, insulin pen needle, saxenda, loratadine, alive womens energy, symtuza, valacyclovir, and vitamin d (ergocalciferol). Current Outpatient Medications on File Prior to Visit  Medication Sig Dispense Refill  . aspirin-acetaminophen-caffeine (EXCEDRIN MIGRAINE) 250-250-65 MG tablet Take by mouth every 6 (six) hours as needed for headache.    [name] atenolol (TENORMIN) 25 MG  tablet Take 1 tablet by mouth once daily 90 tablet 1  . hydrochlorothiazide (HYDRODIURIL) 25 MG tablet TAKE 1 TABLET BY MOUTH ONCE DAILY **  MUST  HAVE  OFFICE  VISIT  BEFORE  FURTHER  REFILLS** 90 tablet 0  . Ibuprofen 200 MG CAPS Take 200 mg by mouth every 6 (six) hours as needed.     . Insulin Pen Needle 32G X 4 MM MISC 1 Package by Does not apply route daily. 100 each 0  . Liraglutide -Weight Management (SAXENDA) 18 MG/3ML SOPN Inject 3 mg into the skin daily. 15 mL 0  . loratadine (CLARITIN) 10 MG tablet Take 10 mg by mouth daily.    . Multiple Vitamins-Minerals (ALIVE WOMENS ENERGY) TABS Take 1 tablet by mouth daily.    . SYMTUZA 800-150-200-10 MG TABS TAKE 1 TABLET BY MOUTH DAILY WITH BREAKFAST. 30 tablet 10  . valACYclovir (VALTREX) 500 MG tablet Take 1 tablet (500 mg total) by mouth 2 (two) times daily. (Patient taking differently: Take 500 mg by mouth 2 (two) times daily as needed. ) 10 tablet 5  . Vitamin D, Ergocalciferol, (DRISDOL) 1.25 MG (50000 UNIT) CAPS capsule Take 1 capsule (50,000 Units total) by mouth every 7 (seven) days. 4 capsule 0   No current facility-administered medications on file prior to visit.   She has No Known Allergies..  Review of Systems Review of Systems  Constitutional: Negative for activity change, appetite change and fatigue.  HENT: Negative for hearing loss, congestion, tinnitus and ear discharge.  dentist q72m Eyes: Negative for visual disturbance (see optho q1y -- vision corrected to 20/20 with glasses).  Respiratory: Negative for cough, chest tightness and shortness of breath.   Cardiovascular: Negative for chest pain, palpitations and leg swelling.  Gastrointestinal: Negative for abdominal pain, diarrhea, constipation and abdominal distention.  Genitourinary: Negative for urgency, frequency, decreased urine volume and difficulty urinating.  Musculoskeletal: Negative for back pain, arthralgias and gait problem.  Skin: Negative for color change,  pallor and rash.  Neurological: Negative for dizziness, light-headedness, numbness and headaches.  Hematological: Negative for adenopathy. Does not bruise/bleed easily.  Psychiatric/Behavioral: Negative for suicidal ideas, confusion, sleep disturbance, self-injury, dysphoric mood, decreased concentration and agitation.       Objective:    BP 124/86 (BP Location: Right Arm, Patient Position: Sitting, Cuff Size: Large)   Pulse 81   Temp 98.2 F (36.8 C) (Oral)   Resp 18   Ht 5\' 2"  (1.575 m)   Wt 266 lb 12.8 oz (121 kg)   SpO2 97%   BMI 48.80 kg/m  General appearance: alert, cooperative, appears stated age and no distress Head: Normocephalic, without obvious abnormality, atraumatic Eyes: negative findings: lids and lashes normal, conjunctivae and sclerae normal and pupils equal, round, reactive to light and accomodation Ears: normal TM's and external ear canals both ears Neck: no adenopathy, no carotid bruit, no JVD, supple, symmetrical, trachea midline and thyroid not enlarged, symmetric, no tenderness/mass/nodules Back: symmetric,  no curvature. ROM normal. No CVA tenderness. Lungs: clear to auscultation bilaterally Breasts: no masses, no dimpling , no inverted nipples , no axillary nodes , no nipple d/c Heart: regular rate and rhythm, S1, S2 normal, no murmur, click, rub or gallop Abdomen: soft, non-tender; bowel sounds normal; no masses,  no organomegaly Pelvic: deferred --pt preferred to defer Extremities: extremities normal, atraumatic, no cyanosis or edema Pulses: 2+ and symmetric Skin: Skin color, texture, turgor normal. No rashes or lesions Lymph nodes: Cervical, supraclavicular, and axillary nodes normal. Neurologic: Alert and oriented X 3, normal strength and tone. Normal symmetric reflexes. Normal coordination and gait    Assessment:    Healthy female exam.      Plan:    ghm utd Check labs  See After Visit Summary for Counseling Recommendations    1. Essential  hypertension Well controlled, no changes to meds. Encouraged heart healthy diet such as the DASH diet and exercise as tolerated.   - hydrochlorothiazide (HYDRODIURIL) 25 MG tablet; 1 po qd  Dispense: 90 tablet; Refill: 3 - atenolol (TENORMIN) 25 MG tablet; Take 1 tablet (25 mg total) by mouth daily.  Dispense: 90 tablet; Refill: 3  2. Preventative health care See above Labs done with [facility]  3. Encounter for screening mammogram for malignant neoplasm of breast   - MM DIGITAL SCREENING BILATERAL; Future  4. Human immunodeficiency virus (HIV) disease ([place]) Per id  5. Hyperlipidemia, unspecified hyperlipidemia type Encouraged heart healthy diet, increase exercise, avoid trans fats, consider a krill oil cap daily Lab Results  Component Value Date   CHOL 223 (H) 05/07/2020   HDL 43 (L) 05/07/2020   LDLCALC 156 (H) 05/07/2020   TRIG 121 05/07/2020   CHOLHDL 5.2 (H) 05/07/2020     6. Class 3 severe obesity with serious comorbidity and body mass index (BMI) of 45.0 to 49.9 in adult, unspecified obesity type ([place]) Per [facility]  7. Insulin resistance On saxenda  Cont [facility]

## 2020-06-01 NOTE — Assessment & Plan Note (Signed)
Per healthy weight and wellness

## 2020-06-01 NOTE — Patient Instructions (Signed)

## 2020-06-01 NOTE — Assessment & Plan Note (Signed)
con't meds and labs per [facility]

## 2020-07-18 ENCOUNTER — Encounter (INDEPENDENT_AMBULATORY_CARE_PROVIDER_SITE_OTHER): Payer: Self-pay | Admitting: Physician Assistant

## 2020-07-18 ENCOUNTER — Other Ambulatory Visit: Payer: Self-pay

## 2020-07-18 ENCOUNTER — Ambulatory Visit (INDEPENDENT_AMBULATORY_CARE_PROVIDER_SITE_OTHER): Payer: BC Managed Care – PPO | Admitting: Physician Assistant

## 2020-07-18 VITALS — BP 115/66 | Temp 98.6°F | Ht 62.0 in | Wt 262.0 lb

## 2020-07-18 DIAGNOSIS — I10 Essential (primary) hypertension: Secondary | ICD-10-CM | POA: Diagnosis not present

## 2020-07-18 DIAGNOSIS — Z6841 Body Mass Index (BMI) 40.0 and over, adult: Secondary | ICD-10-CM | POA: Diagnosis not present

## 2020-07-18 DIAGNOSIS — Z9189 Other specified personal risk factors, not elsewhere classified: Secondary | ICD-10-CM

## 2020-07-18 DIAGNOSIS — E559 Vitamin D deficiency, unspecified: Secondary | ICD-10-CM | POA: Diagnosis not present

## 2020-07-19 NOTE — Progress Notes (Signed)
Chief Complaint:   OBESITY Autumn Curry is here to discuss her progress with her obesity treatment plan along with follow-up of her obesity related diagnoses. Autumn Curry is on the Category 2 Plan and states she is following her eating plan approximately 50% of the time. Autumn Curry states she is doing cardio for 30 minutes 2 times per week.  Today's visit was #: 48 Starting weight: 253 lbs Starting date: 12/14/2017 Today's weight: 262 lbs Today's date: 07/18/2020 Total lbs lost to date: 0 Total lbs lost since last in-office visit: 0  Interim History: Autumn Curry was not on the plan over the holidays. She just got back on the plan 2 days ago. She is ready to recommit to the plan.  Subjective:   1. Vitamin D deficiency Autumn Curry is on Vit D, and she denies nausea, vomiting, or muscle weakness. She is due for labs soon.  2. Essential hypertension Autumn Curry's blood pressure is controlled, and she denies headache or chest pain.  3. At risk for heart disease Autumn Curry is at a higher than average risk for cardiovascular disease due to obesity.   Assessment/Plan:   1. Vitamin D deficiency Low Vitamin D level contributes to fatigue and are associated with obesity, breast, and colon cancer. We will refill prescription Vitamin D for 1 month. Autumn Curry will follow-up for routine testing of Vitamin D, at least 2-3 times per year to avoid over-replacement.  2. Essential hypertension Autumn Curry will continue her medications, and her meal plan. She will continue working on healthy weight loss and exercise to improve blood pressure control. We will watch for signs of hypotension as she continues her lifestyle modifications.  3. At risk for heart disease Autumn Curry was given approximately 15 minutes of coronary artery disease prevention counseling today. She is 40 y.o. female and has risk factors for heart disease including obesity. We discussed intensive lifestyle modifications today with an emphasis on specific weight loss  instructions and strategies.   Repetitive spaced learning was employed today to elicit superior memory formation and behavioral change.  4. Class 3 severe obesity with serious comorbidity and body mass index (BMI) of 45.0 to 49.9 in adult, unspecified obesity type ([facility]) Autumn Curry is currently in the action stage of change. As such, her goal is to continue with weight loss efforts. She has agreed to keeping a food journal and adhering to recommended goals of 1100-1200 calories and 85 grams of protein daily.   Exercise goals: As is.  Behavioral modification strategies: meal planning and cooking strategies and keeping healthy foods in the home.  Autumn Curry has agreed to follow-up with our clinic in 2 weeks. She was informed of the importance of frequent follow-up visits to maximize her success with intensive lifestyle modifications for her multiple health conditions.   Objective:   Blood pressure 115/66, temperature 98.6 F (37 C), height 5\' 2"  (1.575 m), weight 262 lb (118.8 kg). Body mass index is 47.92 kg/m.  General: Cooperative, alert, well developed, in no acute distress. HEENT: Conjunctivae and lids unremarkable. Cardiovascular: Regular rhythm.  Lungs: Normal work of breathing. Neurologic: No focal deficits.   Lab Results  Component Value Date   CREATININE 0.76 03/15/2020   BUN 12 03/15/2020   NA 138 03/15/2020   K 3.9 03/15/2020   CL 98 03/15/2020   CO2 25 03/15/2020   Lab Results  Component Value Date   ALT 15 03/15/2020   AST 13 03/15/2020   ALKPHOS 86 03/15/2020   BILITOT 0.3 03/15/2020   Lab Results  Component Value Date   HGBA1C 5.7 (H) 03/15/2020   HGBA1C 5.6 10/10/2019   HGBA1C 5.6 05/26/2019   HGBA1C 5.7 (H) 02/22/2019   HGBA1C 5.6 09/13/2018   Lab Results  Component Value Date   INSULIN 51.3 (H) 03/15/2020   INSULIN 30.3 (H) 10/10/2019   INSULIN 60.9 (H) 02/22/2019   INSULIN 24.8 09/13/2018   INSULIN 28.7 (H) 04/22/2018   Lab Results  Component  Value Date   TSH 1.89 05/26/2019   Lab Results  Component Value Date   CHOL 223 (H) 05/07/2020   HDL 43 (L) 05/07/2020   LDLCALC 156 (H) 05/07/2020   TRIG 121 05/07/2020   CHOLHDL 5.2 (H) 05/07/2020   Lab Results  Component Value Date   WBC 7.3 05/07/2020   HGB 12.7 05/07/2020   HCT 38.0 05/07/2020   MCV 90.3 05/07/2020   PLT 339 05/07/2020   No results found for: IRON, TIBC, FERRITIN  Attestation Statements:   Reviewed by clinician on day of visit: allergies, medications, problem list, medical history, surgical history, family history, social history, and previous encounter notes.   Trude Mcburney, am acting as transcriptionist for [employer], PA-C.  I have reviewed the above documentation for accuracy and completeness, and I agree with the above. Alois Cliche, PA-C

## 2020-07-23 MED ORDER — VITAMIN D (ERGOCALCIFEROL) 1.25 MG (50000 UNIT) PO CAPS: 50000.0000 [IU] | ORAL_CAPSULE | ORAL | 0 refills | Status: DC

## 2020-07-25 ENCOUNTER — Other Ambulatory Visit: Payer: Self-pay | Admitting: Family Medicine

## 2020-07-25 DIAGNOSIS — I10 Essential (primary) hypertension: Secondary | ICD-10-CM

## 2020-08-01 DIAGNOSIS — U071 COVID-19: Secondary | ICD-10-CM | POA: Diagnosis not present

## 2020-08-02 ENCOUNTER — Other Ambulatory Visit: Payer: Self-pay

## 2020-08-02 ENCOUNTER — Encounter (INDEPENDENT_AMBULATORY_CARE_PROVIDER_SITE_OTHER): Payer: Self-pay | Admitting: Physician Assistant

## 2020-08-02 ENCOUNTER — Telehealth (INDEPENDENT_AMBULATORY_CARE_PROVIDER_SITE_OTHER): Payer: BC Managed Care – PPO | Admitting: Physician Assistant

## 2020-08-02 DIAGNOSIS — E7849 Other hyperlipidemia: Secondary | ICD-10-CM

## 2020-08-02 DIAGNOSIS — R7303 Prediabetes: Secondary | ICD-10-CM

## 2020-08-02 DIAGNOSIS — Z6841 Body Mass Index (BMI) 40.0 and over, adult: Secondary | ICD-10-CM | POA: Diagnosis not present

## 2020-08-06 DIAGNOSIS — Z20822 Contact with and (suspected) exposure to covid-19: Secondary | ICD-10-CM | POA: Diagnosis not present

## 2020-08-06 NOTE — Progress Notes (Signed)
TeleHealth Visit:  Due to the COVID-19 pandemic, this visit was completed with telemedicine (audio/video) technology to reduce patient and provider exposure as well as to preserve personal protective equipment.   Autumn Curry has verbally consented to this TeleHealth visit. The patient is located at home, the provider is located at the [facility] office. The participants in this visit include the listed provider and patient. The visit was conducted today via MyChart Video.   Chief Complaint: OBESITY Autumn Curry is here to discuss her progress with her obesity treatment plan along with follow-up of her obesity related diagnoses. Autumn Curry is on the Category 2 Plan and states she is following her eating plan approximately 80% of the time. Autumn Curry states she is doing cardio for 30 minutes 3 times per week.  Today's visit was #: 74 Starting weight: 253 lbs Starting date: 12/14/2017  Interim History: Autumn Curry was diagnosed with COVID yesterday. Her appetite has been decreased. She is fatigued, congested and she has been sleeping a lot. Prior to getting sick, she states that she was doing well with the plan. Her Kirke Shaggy was not approved by her insurance.  Subjective:   1. Hyperlipidemia associated with hypertension Autumn Curry is not on any medications, and she denies chest pain or headache.  2. Pre-diabetes Autumn Curry is not on medications, and her last A1c was 5.7. She reports intermittent polyphagia.  Assessment/Plan:   1. Hyperlipidemia associated with hypertension [name] will continue with her meal plan, and will continue working on healthy weight loss and exercise to improve blood pressure control. We will watch for signs of hypotension as she continues her lifestyle modifications.  2. Pre-diabetes Autumn Curry will continue to work on weight loss, exercise, and decreasing simple carbohydrates to help decrease the risk of diabetes. She is to speak with her insurance company to see which  GLP-1 they will approve.  3. Class 3 severe obesity with serious comorbidity and body mass index (BMI) of 45.0 to 49.9 in adult, unspecified obesity type (HCC) Autumn Curry is currently in the action stage of change. As such, her goal is to continue with weight loss efforts. She has agreed to the Category 2 Plan.   Exercise goals: As is.  Behavioral modification strategies: increasing lean protein intake and meal planning and cooking strategies.  Autumn Curry has agreed to follow-up with our clinic in 2 weeks. She was informed of the importance of frequent follow-up visits to maximize her success with intensive lifestyle modifications for her multiple health conditions.  Objective:   VITALS: Per patient if applicable, see vitals. GENERAL: Alert and in no acute distress. CARDIOPULMONARY: No increased WOB. Speaking in clear sentences.  PSYCH: Pleasant and cooperative. Speech normal rate and rhythm. Affect is appropriate. Insight and judgement are appropriate. Attention is focused, linear, and appropriate.  NEURO: Oriented as arrived to appointment on time with no prompting.   Lab Results  Component Value Date   CREATININE 0.76 03/15/2020   BUN 12 03/15/2020   NA 138 03/15/2020   K 3.9 03/15/2020   CL 98 03/15/2020   CO2 25 03/15/2020   Lab Results  Component Value Date   ALT 15 03/15/2020   AST 13 03/15/2020   ALKPHOS 86 03/15/2020   BILITOT 0.3 03/15/2020   Lab Results  Component Value Date   HGBA1C 5.7 (H) 03/15/2020   HGBA1C 5.6 10/10/2019   HGBA1C 5.6 05/26/2019   HGBA1C 5.7 (H) 02/22/2019   HGBA1C 5.6 09/13/2018   Lab Results  Component Value Date   INSULIN  51.3 (H) 03/15/2020   INSULIN 30.3 (H) 10/10/2019   INSULIN 60.9 (H) 02/22/2019   INSULIN 24.8 09/13/2018   INSULIN 28.7 (H) 04/22/2018   Lab Results  Component Value Date   TSH 1.89 05/26/2019   Lab Results  Component Value Date   CHOL 223 (H) 05/07/2020   HDL 43 (L) 05/07/2020   LDLCALC 156 (H) 05/07/2020    TRIG 121 05/07/2020   CHOLHDL 5.2 (H) 05/07/2020   Lab Results  Component Value Date   WBC 7.3 05/07/2020   HGB 12.7 05/07/2020   HCT 38.0 05/07/2020   MCV 90.3 05/07/2020   PLT 339 05/07/2020   No results found for: IRON, TIBC, FERRITIN  Attestation Statements:   Reviewed by clinician on day of visit: allergies, medications, problem list, medical history, surgical history, family history, social history, and previous encounter notes.   Wilhemena Durie, am acting as transcriptionist for [employer], PA-C.  I have reviewed the above documentation for accuracy and completeness, and I agree with the above. Abby Potash, PA-C

## 2020-08-15 ENCOUNTER — Other Ambulatory Visit: Payer: Self-pay | Admitting: Family Medicine

## 2020-08-15 DIAGNOSIS — I10 Essential (primary) hypertension: Secondary | ICD-10-CM

## 2020-08-20 ENCOUNTER — Other Ambulatory Visit: Payer: Self-pay

## 2020-08-20 ENCOUNTER — Encounter (INDEPENDENT_AMBULATORY_CARE_PROVIDER_SITE_OTHER): Payer: Self-pay | Admitting: Family Medicine

## 2020-08-20 ENCOUNTER — Telehealth (INDEPENDENT_AMBULATORY_CARE_PROVIDER_SITE_OTHER): Payer: Self-pay

## 2020-08-20 ENCOUNTER — Ambulatory Visit (INDEPENDENT_AMBULATORY_CARE_PROVIDER_SITE_OTHER): Payer: BC Managed Care – PPO | Admitting: Family Medicine

## 2020-08-20 VITALS — BP 121/80 | HR 76 | Temp 98.1°F | Ht 62.0 in | Wt 268.0 lb

## 2020-08-20 DIAGNOSIS — E559 Vitamin D deficiency, unspecified: Secondary | ICD-10-CM | POA: Diagnosis not present

## 2020-08-20 DIAGNOSIS — I10 Essential (primary) hypertension: Secondary | ICD-10-CM

## 2020-08-20 DIAGNOSIS — Z9189 Other specified personal risk factors, not elsewhere classified: Secondary | ICD-10-CM

## 2020-08-20 DIAGNOSIS — Z6841 Body Mass Index (BMI) 40.0 and over, adult: Secondary | ICD-10-CM

## 2020-08-20 DIAGNOSIS — R7303 Prediabetes: Secondary | ICD-10-CM | POA: Diagnosis not present

## 2020-08-20 MED ORDER — INSULIN PEN NEEDLE 32G X 4 MM MISC: 1.0000 | Freq: Every day | 0 refills | Status: DC

## 2020-08-20 MED ORDER — VITAMIN D (ERGOCALCIFEROL) 1.25 MG (50000 UNIT) PO CAPS: 50000.0000 [IU] | ORAL_CAPSULE | ORAL | 0 refills | Status: DC

## 2020-08-20 MED ORDER — SAXENDA 18 MG/3ML ~~LOC~~ SOPN: 3.0000 mg | PEN_INJECTOR | Freq: Every day | SUBCUTANEOUS | 0 refills | Status: DC

## 2020-08-20 NOTE — Telephone Encounter (Signed)
Prior Auth submitted via cover my meds for saxenda:  Your information has been submitted to [place]. [employer] [place] will review the request and notify you of the determination decision directly, typically within 72 hours of receiving all information.  You will also receive your request decision electronically. To check for an update later, open this request again from your dashboard.  If [employer] has not responded within the specified timeframe or if you have any questions about your PA submission, contact Senath Bruce directly at [phone].

## 2020-08-22 NOTE — Progress Notes (Signed)
Chief Complaint:   OBESITY Autumn Curry is here to discuss her progress with her obesity treatment plan along with follow-up of her obesity related diagnoses.   Today's visit was #: 64 Starting weight: 253 lbs Starting date: 12/14/2017 Today's weight: 268 lbs Today's date: 08/20/2020 Total lbs lost to date: +15 lbs Body mass index is 49.02 kg/m.   Interim History: Char says that breakfast and dinner are difficult to follow.  Otherwise, she has no issues with the plan.  Meal prep and planning are challenging for her.  No hunger or cravings when eating on plan.  Plan:  Refill Saxenda.  She denies the need for a meal plan change.  Strategies for meal prep and planning were discussed in great detail.  Nutrition Plan: Category 2 for 50% of the time. Anti-obesity medications: Saxenda. Reported side effects: None. Activity: Cardio at home / [employer] for 30 mintues 2 times per week.  Assessment/Plan:   No orders of the defined types were placed in this encounter.   Medications Discontinued During This Encounter  Medication Reason  . Liraglutide -Weight Management (SAXENDA) 18 MG/3ML SOPN Reorder  . Insulin Pen Needle 32G X 4 MM MISC Reorder  . Vitamin D, Ergocalciferol, (DRISDOL) 1.25 MG (50000 UNIT) CAPS capsule Reorder     Meds ordered this encounter  Medications  . Insulin Pen Needle 32G X 4 MM MISC    Sig: 1 Package by Does not apply route daily.    Dispense:  100 each    Refill:  0  . Liraglutide -Weight Management (SAXENDA) 18 MG/3ML SOPN    Sig: Inject 3 mg into the skin daily.    Dispense:  15 mL    Refill:  0  . Vitamin D, Ergocalciferol, (DRISDOL) 1.25 MG (50000 UNIT) CAPS capsule    Sig: Take 1 capsule (50,000 Units total) by mouth every 7 (seven) days.    Dispense:  4 capsule    Refill:  0     1. Prediabetes She has not called/spoken to her insurance yet regarding coverage for prediabetes meds/GLP-1 coverage.  A1c 5.7, FI 51.3.  Plan:  Will refill  Saxenda today (but we are not sure it will be covered by insurance now).  Consider rechecking A1c in the near future along with FI.   Lab Results  Component Value Date   HGBA1C 5.7 (H) 03/15/2020   Lab Results  Component Value Date   INSULIN 51.3 (H) 03/15/2020   INSULIN 30.3 (H) 10/10/2019   INSULIN 60.9 (H) 02/22/2019   INSULIN 24.8 09/13/2018   INSULIN 28.7 (H) 04/22/2018   2. Vitamin D deficiency Not at goal. Current vitamin D is 30.7, tested on 03/15/2020. Optimal goal > 50 ng/dL.   Plan: Continue to take prescription Vitamin D @50 ,000 IU every week as prescribed.  Will refill vitamin D today, as per below.  Will reconsider rechecking vitamin D level in the near future.  - Refill Vitamin D, Ergocalciferol, (DRISDOL) 1.25 MG (50000 UNIT) CAPS capsule; Take 1 capsule (50,000 Units total) by mouth every 7 (seven) days.  Dispense: 4 capsule; Refill: 0  3. Essential hypertension At goal. Medications: atenolol 25 mg daily.  She does not do any home blood pressure monitoring.  No symptoms or concerns.  Plan: Avoid buying foods that are: processed, frozen, or prepackaged to avoid excess salt. We will continue to monitor closely alongside her PCP and/or Specialist.  Regular follow up with PCP and specialists was also encouraged.   BP  Readings from Last 3 Encounters:  08/20/20 121/80  07/18/20 115/66  06/01/20 124/86   Lab Results  Component Value Date   CREATININE 0.76 03/15/2020   4. At risk for impaired metabolic function Due to Azya's current state of health and medical condition(s), she is at a significantly higher risk for impaired metabolic function.  This places the patient at a much greater risk to subsequently develop cardiopulmonary conditions that can negatively affect the patient's quality of life.  At least 9 minutes was spent on counseling Keylie about these concerns today, and I stressed the importance of reversing these risks factors.  The initial goal is to lose at  least 5-10% of starting weight to help reduce risk factors.  Counseling:  Intensive lifestyle modifications discussed with Lourene as the most appropriate first line treatment.  she will continue to work on diet, exercise, and weight loss efforts.  We will continue to reassess these conditions on a fairly regular basis in an attempt to decrease the patient's overall morbidity and mortality.  5. Class 3 severe obesity with serious comorbidity and body mass index (BMI) of 45.0 to 49.9 in adult, unspecified obesity type (HCC)  - Refill Liraglutide -Weight Management (SAXENDA) 18 MG/3ML SOPN; Inject 3 mg into the skin daily.  Dispense: 15 mL; Refill: 0 - Refill Insulin Pen Needle 32G X 4 MM MISC; 1 Package by Does not apply route daily.  Dispense: 100 each; Refill: 0  Course: Kielyn is currently in the action stage of change. As such, her goal is to continue with weight loss efforts.   Nutrition goals: She has agreed to the Category 2 Plan.   Exercise goals: Increase to 150 minutes of exercise per week as tolerated.  Behavioral modification strategies: increasing lean protein intake, meal planning and cooking strategies, keeping healthy foods in the home and planning for success.  Darthy has agreed to follow-up with our clinic in 2-3 weeks with APP. She was informed of the importance of frequent follow-up visits to maximize her success with intensive lifestyle modifications for her multiple health conditions.   Objective:   Blood pressure 121/80, pulse 76, temperature 98.1 F (36.7 C), height 5\' 2"  (1.575 m), weight 268 lb (121.6 kg), SpO2 97 %. Body mass index is 49.02 kg/m.  General: Cooperative, alert, well developed, in no acute distress. HEENT: Conjunctivae and lids unremarkable. Cardiovascular: Regular rhythm.  Lungs: Normal work of breathing. Neurologic: No focal deficits.   Lab Results  Component Value Date   CREATININE 0.76 03/15/2020   BUN 12 03/15/2020   NA 138 03/15/2020    K 3.9 03/15/2020   CL 98 03/15/2020   CO2 25 03/15/2020   Lab Results  Component Value Date   ALT 15 03/15/2020   AST 13 03/15/2020   ALKPHOS 86 03/15/2020   BILITOT 0.3 03/15/2020   Lab Results  Component Value Date   HGBA1C 5.7 (H) 03/15/2020   HGBA1C 5.6 10/10/2019   HGBA1C 5.6 05/26/2019   HGBA1C 5.7 (H) 02/22/2019   HGBA1C 5.6 09/13/2018   Lab Results  Component Value Date   INSULIN 51.3 (H) 03/15/2020   INSULIN 30.3 (H) 10/10/2019   INSULIN 60.9 (H) 02/22/2019   INSULIN 24.8 09/13/2018   INSULIN 28.7 (H) 04/22/2018   Lab Results  Component Value Date   TSH 1.89 05/26/2019   Lab Results  Component Value Date   CHOL 223 (H) 05/07/2020   HDL 43 (L) 05/07/2020   LDLCALC 156 (H) 05/07/2020   TRIG 121  05/07/2020   CHOLHDL 5.2 (H) 05/07/2020   Lab Results  Component Value Date   WBC 7.3 05/07/2020   HGB 12.7 05/07/2020   HCT 38.0 05/07/2020   MCV 90.3 05/07/2020   PLT 339 05/07/2020   Attestation Statements:   Reviewed by clinician on day of visit: allergies, medications, problem list, medical history, surgical history, family history, social history, and previous encounter notes.  I, Water quality scientist, CMA, am acting as Location manager for [employer], DO.  I have reviewed the above documentation for accuracy and completeness, and I agree with the above. Marjory Sneddon, D.O.  The [place] was signed into law in 2016 which includes the topic of electronic health records.  This provides immediate access to information in MyChart.  This includes consultation notes, operative notes, office notes, lab results and pathology reports.  If you have any questions about what you read please let us know at your next visit so we can discuss your concerns and take corrective action if need be.  We are right here with you.

## 2020-08-23 NOTE — Telephone Encounter (Signed)
Fax from [employer]:  Prior Auth for Autumn Curry has been denied. The requested medication is not covered per the benefit plan. Pt has been informed on my chart, Dr Raliegh Scarlet is aware.

## 2020-09-03 ENCOUNTER — Ambulatory Visit (INDEPENDENT_AMBULATORY_CARE_PROVIDER_SITE_OTHER): Payer: BC Managed Care – PPO | Admitting: Physician Assistant

## 2020-09-03 ENCOUNTER — Encounter (INDEPENDENT_AMBULATORY_CARE_PROVIDER_SITE_OTHER): Payer: Self-pay | Admitting: Physician Assistant

## 2020-09-03 ENCOUNTER — Other Ambulatory Visit: Payer: Self-pay

## 2020-09-03 VITALS — BP 125/76 | HR 82 | Temp 98.5°F | Ht 62.0 in | Wt 263.0 lb

## 2020-09-03 DIAGNOSIS — R7303 Prediabetes: Secondary | ICD-10-CM | POA: Diagnosis not present

## 2020-09-03 DIAGNOSIS — Z6841 Body Mass Index (BMI) 40.0 and over, adult: Secondary | ICD-10-CM

## 2020-09-03 DIAGNOSIS — Z9189 Other specified personal risk factors, not elsewhere classified: Secondary | ICD-10-CM

## 2020-09-03 DIAGNOSIS — E559 Vitamin D deficiency, unspecified: Secondary | ICD-10-CM

## 2020-09-03 MED ORDER — VITAMIN D (ERGOCALCIFEROL) 1.25 MG (50000 UNIT) PO CAPS: 50000.0000 [IU] | ORAL_CAPSULE | ORAL | 0 refills | Status: DC

## 2020-09-03 MED ORDER — OZEMPIC (0.25 OR 0.5 MG/DOSE) 2 MG/1.5ML ~~LOC~~ SOPN: 0.2500 mg | PEN_INJECTOR | SUBCUTANEOUS | 0 refills | Status: DC

## 2020-09-04 NOTE — Telephone Encounter (Signed)
Last seen Tracey 

## 2020-09-04 NOTE — Progress Notes (Signed)
Chief Complaint:   OBESITY Autumn Curry is here to discuss her progress with her obesity treatment plan along with follow-up of her obesity related diagnoses. Autumn Curry is on the Category 2 Plan and states she is following her eating plan approximately 80-85% of the time. Autumn Curry states she is doing 0 minutes 0 times per week.  Today's visit was #: 43 Starting weight: 253 lbs Starting date: 12/14/2017 Today's weight: 263 lbs Today's date: 09/03/2020 Total lbs lost to date: 0 Total lbs lost since last in-office visit: 5  Interim History: Autumn Curry reports that she has been journaling consistently and meal prepping and planning. She is on 0.6 mg of Saxenda but she still feels hungry at times between meals.  Subjective:   1. Pre-diabetes Autumn Curry is on Saxenda for weight loss but insurance is no longer going to pay for it. She denies nausea, vomiting or diarrhea. She is not on pre-diabetes medications.  2. Vitamin D deficiency Autumn Curry is on prescription Vit D, and she denies nausea, vomiting, or muscle weakness.  3. At risk for diabetes mellitus Autumn Curry is at higher than average risk for developing diabetes due to obesity.   Assessment/Plan:   1. Pre-diabetes Autumn Curry agreed to start Ozempic 0.25 mg #4 with no refills. She will continue to work on weight loss, exercise, and decreasing simple carbohydrates to help decrease the risk of diabetes.   2. Vitamin D deficiency Low Vitamin D level contributes to fatigue and are associated with obesity, breast, and colon cancer. We will refill prescription Vitamin D for 1 month. Autumn Curry will follow-up for routine testing of Vitamin D, at least 2-3 times per year to avoid over-replacement.  - Vitamin D, Ergocalciferol, (DRISDOL) 1.25 MG (50000 UNIT) CAPS capsule; Take 1 capsule (50,000 Units total) by mouth every 7 (seven) days.  Dispense: 4 capsule; Refill: 0  3. At risk for diabetes mellitus Autumn Curry was given approximately 15 minutes of diabetes  education and counseling today. We discussed intensive lifestyle modifications today with an emphasis on weight loss as well as increasing exercise and decreasing simple carbohydrates in her diet. We also reviewed medication options with an emphasis on risk versus benefit of those discussed.   Repetitive spaced learning was employed today to elicit superior memory formation and behavioral change.  4. Class 3 severe obesity with serious comorbidity and body mass index (BMI) of 45.0 to 49.9 in adult, unspecified obesity type (HCC) Autumn Curry is currently in the action stage of change. As such, her goal is to continue with weight loss efforts. She has agreed to the Category 2 Plan.   We discussed various medication options to help Autumn Curry with her weight loss efforts and we both agreed to discontinue Saxenda when she is finished.  Exercise goals: No exercise has been prescribed at this time.  Behavioral modification strategies: planning for success and keeping a strict food journal.  Autumn Curry has agreed to follow-up with our clinic in 2 weeks. She was informed of the importance of frequent follow-up visits to maximize her success with intensive lifestyle modifications for her multiple health conditions.   Objective:   Blood pressure 125/76, pulse 82, temperature 98.5 F (36.9 C), height 5\' 2"  (1.575 m), weight 263 lb (119.3 kg), SpO2 97 %. Body mass index is 48.1 kg/m.  General: Cooperative, alert, well developed, in no acute distress. HEENT: Conjunctivae and lids unremarkable. Cardiovascular: Regular rhythm.  Lungs: Normal work of breathing. Neurologic: No focal deficits.   Lab Results  Component Value Date  CREATININE 0.76 03/15/2020   BUN 12 03/15/2020   NA 138 03/15/2020   K 3.9 03/15/2020   CL 98 03/15/2020   CO2 25 03/15/2020   Lab Results  Component Value Date   ALT 15 03/15/2020   AST 13 03/15/2020   ALKPHOS 86 03/15/2020   BILITOT 0.3 03/15/2020   Lab Results   Component Value Date   HGBA1C 5.7 (H) 03/15/2020   HGBA1C 5.6 10/10/2019   HGBA1C 5.6 05/26/2019   HGBA1C 5.7 (H) 02/22/2019   HGBA1C 5.6 09/13/2018   Lab Results  Component Value Date   INSULIN 51.3 (H) 03/15/2020   INSULIN 30.3 (H) 10/10/2019   INSULIN 60.9 (H) 02/22/2019   INSULIN 24.8 09/13/2018   INSULIN 28.7 (H) 04/22/2018   Lab Results  Component Value Date   TSH 1.89 05/26/2019   Lab Results  Component Value Date   CHOL 223 (H) 05/07/2020   HDL 43 (L) 05/07/2020   LDLCALC 156 (H) 05/07/2020   TRIG 121 05/07/2020   CHOLHDL 5.2 (H) 05/07/2020   Lab Results  Component Value Date   WBC 7.3 05/07/2020   HGB 12.7 05/07/2020   HCT 38.0 05/07/2020   MCV 90.3 05/07/2020   PLT 339 05/07/2020   No results found for: IRON, TIBC, FERRITIN  Attestation Statements:   Reviewed by clinician on day of visit: allergies, medications, problem list, medical history, surgical history, family history, social history, and previous encounter notes.   Wilhemena Durie, am acting as transcriptionist for [employer], PA-C.  I have reviewed the above documentation for accuracy and completeness, and I agree with the above. Abby Potash, PA-C

## 2020-09-05 ENCOUNTER — Encounter: Payer: Self-pay | Admitting: Family Medicine

## 2020-09-05 ENCOUNTER — Telehealth: Payer: Self-pay

## 2020-09-05 ENCOUNTER — Other Ambulatory Visit: Payer: Self-pay

## 2020-09-05 ENCOUNTER — Ambulatory Visit: Payer: BC Managed Care – PPO | Admitting: Family Medicine

## 2020-09-05 VITALS — BP 130/84 | HR 83 | Temp 98.3°F | Ht 62.0 in | Wt 268.2 lb

## 2020-09-05 DIAGNOSIS — H1031 Unspecified acute conjunctivitis, right eye: Secondary | ICD-10-CM | POA: Diagnosis not present

## 2020-09-05 MED ORDER — POLYMYXIN B-TRIMETHOPRIM 10000-0.1 UNIT/ML-% OP SOLN: 2.0000 [drp] | Freq: Four times a day (QID) | OPHTHALMIC | 0 refills | Status: DC

## 2020-09-05 NOTE — Telephone Encounter (Signed)
Nurse Assessment Nurse: Micki Riley, RN, Domenick Gong Date/Time (Eastern Time): 09/05/2020 7:17:11 AM Confirm and document reason for call. If symptomatic, describe symptoms. ---Caller states she has been having blurry vision & redness/ itchiness to right eye, barely able to see; first noted last week. Denies any other symptoms. States has been feeling fatigued since having COVID last month. Does the patient have any new or worsening symptoms? ---Yes Will a triage be completed? ---Yes Related visit to physician within the last 2 weeks? ---N/A Does the PT have any chronic conditions? (i.e. diabetes, asthma, this includes High risk factors for pregnancy, etc.) ---Unknown Is the patient pregnant or possibly pregnant? (Ask all females between the ages of 58-55) ---No Is this a behavioral health or substance abuse call? ---No Guidelines Guideline Title Affirmed Question Affirmed Notes Nurse Date/Time (Eastern Time) Vision Loss or Change [1] Eye pain AND [2] brief (now gone) blurred vision or visual changes Cazares, RN, Domenick Gong 09/05/2020 7:20:41 AM Disp. Time Eilene Ghazi Time) Disposition Final User 09/05/2020 7:23:01 AM See HCP within 4 Hours (or PCP triage) Yes Cazares, RN, Domenick Gong PLEASE NOTE: All timestamps contained within this report are represented as [place] Standard Time. CONFIDENTIALTY NOTICE: This fax transmission is intended only for the addressee. It contains information that is legally privileged, confidential or otherwise protected from use or disclosure. If you are not the intended recipient, you are strictly prohibited from reviewing, disclosing, copying using or disseminating any of this information or taking any action in reliance on or regarding this information. If you have received this fax in error, please notify us immediately by telephone so that we can arrange for its return to [place]. Phone: [phone], Toll-Free: [phone], Fax: [phone] Page:  2 of 2 Call Id: [id number] Caller Disagree/Comply Comply Caller Understands Yes PreDisposition InappropriateToAsk Care Advice Given Per Guideline CARE ADVICE given per Vision Loss or Change (Adult) guideline. CALL BACK IF: * You become worse ANOTHER ADULT SHOULD DRIVE: SEE HCP (OR PCP TRIAGE) WITHIN 4 HOURS: * IF OFFICE WILL BE OPEN: You need to be seen within the next 3 or 4 hours. Call your doctor (or NP/PA) now or as soon as the office opens. Referrals REFERRED TO PCP OFFICE

## 2020-09-05 NOTE — Patient Instructions (Addendum)
Artificial tears like Refresh and Systane may be used for comfort. OK to get generic version. Generally people use them every 2-4 hours, but you can use them as much as you want because there is no medication in it.  Try not to touch your face.  Warm compresses can be helpful also.   Send me a message in a week if no better.    Let us know if you need anything.

## 2020-09-05 NOTE — Progress Notes (Signed)
Chief Complaint  Patient presents with  . Eye Pain  . Eye Drainage    Right eye     Autumn Curry is here for right eye irritation.  Duration: a few weeks Itchy, red, burning, crusty eyes Chemical exposure? No  Recent URI? Had covid in Jan Contact lenses? No  History of allergies? Yes, seasonal allergies Treatment to date: OTC eye drops, cold medication  Past Medical History:  Diagnosis Date  . Absence of menstruation   . Carbuncle and furuncle of unspecified site   . Elevated blood pressure reading without diagnosis of hypertension   . Encounter for long-term (current) use of other medications   . Headache(784.0)   . 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 Hyperlipidemia   . Hypertension   . Routine general medical examination at a health care facility   . Screening for malignant neoplasm of the cervix   . Syphilis, unspecified    Family History  Problem Relation Age of Onset  . Hyperlipidemia Mother   . Hypertension Mother   . Depression Mother   . Coronary artery disease Father   . Diabetes Father   . Heart disease Father   . Hyperlipidemia Father   . Stroke Father   . Kidney disease Father   . Colon cancer Neg Hx   . Esophageal cancer Neg Hx   . Colon polyps Neg Hx   . Pancreatic cancer Neg Hx   . Stomach cancer Neg Hx     BP 130/84 (BP Location: Left Arm, Patient Position: Sitting, Cuff Size: Large)   Pulse 83   Temp 98.3 F (36.8 C) (Oral)   Ht 5\' 2"  (1.575 m)   Wt 268 lb 4 oz (121.7 kg)   SpO2 97%   BMI 49.06 kg/m  Gen: Awake, alert, appears stated age Eyes: Lidsneg, Sclera neg, PERRLA, EOMi; ear is tearing Nose: Nares patent without discharge Mouth: MMM, pharynx without erythema or exudate Psych: Age appropriate judgment and insight; mood and affect normal  Acute conjunctivitis of right eye, unspecified acute conjunctivitis type - Plan: trimethoprim-polymyxin b (POLYTRIM) ophthalmic solution  Orders as above. Instructed to practice good  hand hygiene and try not to touch face. Warm compresses and artificial tears also recommended. F/u if no improvement in 7-10 days. Pt voiced understanding and agreement to the plan.  [place], DO 09/05/20 10:38 AM

## 2020-09-05 NOTE — Telephone Encounter (Signed)
Pt has visit with Nani Ravens today

## 2020-09-06 NOTE — Telephone Encounter (Signed)
Did she pick up and start using the ozempic?

## 2020-09-06 NOTE — Telephone Encounter (Signed)
Ozempic was prescribed on her last visit. Did you want to make a change prior to her next visit?

## 2020-09-10 ENCOUNTER — Encounter (INDEPENDENT_AMBULATORY_CARE_PROVIDER_SITE_OTHER): Payer: Self-pay

## 2020-09-17 ENCOUNTER — Other Ambulatory Visit: Payer: Self-pay

## 2020-09-17 ENCOUNTER — Encounter (INDEPENDENT_AMBULATORY_CARE_PROVIDER_SITE_OTHER): Payer: Self-pay | Admitting: Physician Assistant

## 2020-09-17 ENCOUNTER — Ambulatory Visit (INDEPENDENT_AMBULATORY_CARE_PROVIDER_SITE_OTHER): Payer: BC Managed Care – PPO | Admitting: Physician Assistant

## 2020-09-17 VITALS — BP 119/76 | HR 84 | Temp 98.2°F | Ht 62.0 in | Wt 258.0 lb

## 2020-09-17 DIAGNOSIS — E559 Vitamin D deficiency, unspecified: Secondary | ICD-10-CM | POA: Diagnosis not present

## 2020-09-17 DIAGNOSIS — Z6841 Body Mass Index (BMI) 40.0 and over, adult: Secondary | ICD-10-CM

## 2020-09-17 DIAGNOSIS — Z9189 Other specified personal risk factors, not elsewhere classified: Secondary | ICD-10-CM | POA: Diagnosis not present

## 2020-09-17 DIAGNOSIS — R7303 Prediabetes: Secondary | ICD-10-CM

## 2020-09-18 NOTE — Progress Notes (Signed)
Chief Complaint:   OBESITY Autumn Curry is here to discuss her progress with her obesity treatment plan along with follow-up of her obesity related diagnoses. Autumn Curry is on the Category 2 Plan and states she is following her eating plan approximately 90% of the time. Autumn Curry states she is doing 0 minutes 0 times per week.  Today's visit was #: 106 Starting weight: 253 lbs Starting date: 12/14/2017 Today's weight: 258 lbs Today's date: 09/17/2020 Total lbs lost to date: 0 Total lbs lost since last in-office visit: 5  Interim History: Autumn Curry reports that she got her Ozempic, but she hasn't started it yet. She has no reservations with taking it. She has been meal planning well. She denies excessive hunger.  Subjective:   1. Pre-diabetes I discussed the role of insulin and A1c with Autumn Curry today. She has not yet started Ozempic. Last A1c was 5.7 and insulin was mildly elevated. She is due for labs.  2. Vitamin D deficiency Autumn Curry is on Vit D weekly, and she is tolerating it well. She is due for labs.  3. At risk for hypoglycemia Autumn Curry has a history of some elevated blood glucose readings without a diagnosis of diabetes.   Assessment/Plan:   1. Pre-diabetes Autumn Curry will continue to work on weight loss, exercise, and decreasing simple carbohydrates to help decrease the risk of diabetes. Autumn Curry agreed to start Ozempic as prescribed (already has the script), and we will recheck labs next month.  2. Vitamin D deficiency Low Vitamin D level contributes to fatigue and are associated with obesity, breast, and colon cancer. Autumn Curry agreed to continue taking prescription Vitamin D 50,000 IU every week and will follow-up for routine testing of Vitamin D, at least 2-3 times per year to avoid over-replacement.  3. At risk for hypoglycemia Autumn Curry was given approximately 15 minutes of counseling today regarding prevention of hypoglycemia. She was advised of symptoms of hypoglycemia. Autumn Curry was  instructed to avoid skipping meals, eat regular protein rich meals and schedule low calorie snacks as needed.   Repetitive spaced learning was employed today to elicit superior memory formation and behavioral change  4. Class 3 severe obesity with serious comorbidity and body mass index (BMI) of 45.0 to 49.9 in adult, unspecified obesity type (HCC) Autumn Curry is currently in the action stage of change. As such, her goal is to continue with weight loss efforts. She has agreed to the Category 2 Plan.   Exercise goals: No exercise has been prescribed at this time.  Behavioral modification strategies: meal planning and cooking strategies and celebration eating strategies.  Autumn Curry has agreed to follow-up with our clinic in 3 weeks. She was informed of the importance of frequent follow-up visits to maximize her success with intensive lifestyle modifications for her multiple health conditions.   Objective:   Blood pressure 119/76, pulse 84, temperature 98.2 F (36.8 C), height 5\' 2"  (1.575 m), weight 258 lb (117 kg), SpO2 98 %. Body mass index is 47.19 kg/m.  General: Cooperative, alert, well developed, in no acute distress. HEENT: Conjunctivae and lids unremarkable. Cardiovascular: Regular rhythm.  Lungs: Normal work of breathing. Neurologic: No focal deficits.   Lab Results  Component Value Date   CREATININE 0.76 03/15/2020   BUN 12 03/15/2020   NA 138 03/15/2020   K 3.9 03/15/2020   CL 98 03/15/2020   CO2 25 03/15/2020   Lab Results  Component Value Date   ALT 15 03/15/2020   AST 13 03/15/2020   ALKPHOS 86 03/15/2020  BILITOT 0.3 03/15/2020   Lab Results  Component Value Date   HGBA1C 5.7 (H) 03/15/2020   HGBA1C 5.6 10/10/2019   HGBA1C 5.6 05/26/2019   HGBA1C 5.7 (H) 02/22/2019   HGBA1C 5.6 09/13/2018   Lab Results  Component Value Date   INSULIN 51.3 (H) 03/15/2020   INSULIN 30.3 (H) 10/10/2019   INSULIN 60.9 (H) 02/22/2019   INSULIN 24.8 09/13/2018   INSULIN 28.7  (H) 04/22/2018   Lab Results  Component Value Date   TSH 1.89 05/26/2019   Lab Results  Component Value Date   CHOL 223 (H) 05/07/2020   HDL 43 (L) 05/07/2020   LDLCALC 156 (H) 05/07/2020   TRIG 121 05/07/2020   CHOLHDL 5.2 (H) 05/07/2020   Lab Results  Component Value Date   WBC 7.3 05/07/2020   HGB 12.7 05/07/2020   HCT 38.0 05/07/2020   MCV 90.3 05/07/2020   PLT 339 05/07/2020   No results found for: IRON, TIBC, FERRITIN  Attestation Statements:   Reviewed by clinician on day of visit: allergies, medications, problem list, medical history, surgical history, family history, social history, and previous encounter notes.   Wilhemena Durie, am acting as transcriptionist for [employer], PA-C.  I have reviewed the above documentation for accuracy and completeness, and I agree with the above. Abby Potash, PA-C
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
## 2020-10-08 ENCOUNTER — Other Ambulatory Visit: Payer: Self-pay

## 2020-10-08 ENCOUNTER — Encounter (INDEPENDENT_AMBULATORY_CARE_PROVIDER_SITE_OTHER): Payer: Self-pay | Admitting: Physician Assistant

## 2020-10-08 ENCOUNTER — Ambulatory Visit (INDEPENDENT_AMBULATORY_CARE_PROVIDER_SITE_OTHER): Payer: BC Managed Care – PPO | Admitting: Physician Assistant

## 2020-10-08 VITALS — BP 125/76 | HR 80 | Temp 98.3°F | Ht 62.0 in | Wt 268.0 lb

## 2020-10-08 DIAGNOSIS — E559 Vitamin D deficiency, unspecified: Secondary | ICD-10-CM | POA: Diagnosis not present

## 2020-10-08 DIAGNOSIS — I10 Essential (primary) hypertension: Secondary | ICD-10-CM

## 2020-10-08 DIAGNOSIS — Z6841 Body Mass Index (BMI) 40.0 and over, adult: Secondary | ICD-10-CM | POA: Diagnosis not present

## 2020-10-08 DIAGNOSIS — Z9189 Other specified personal risk factors, not elsewhere classified: Secondary | ICD-10-CM

## 2020-10-08 DIAGNOSIS — E66813 Obesity, class 3: Secondary | ICD-10-CM

## 2020-10-08 MED ORDER — VITAMIN D (ERGOCALCIFEROL) 1.25 MG (50000 UNIT) PO CAPS: 50000.0000 [IU] | ORAL_CAPSULE | ORAL | 0 refills | Status: DC

## 2020-10-10 NOTE — Progress Notes (Signed)
Chief Complaint:   OBESITY Autumn Curry is here to discuss her progress with her obesity treatment plan along with follow-up of her obesity related diagnoses. Autumn Curry is on the Category 2 Plan and states she is following her eating plan approximately 85% of the time. Autumn Curry states she is doing 0 minutes 0 times per week.  Today's visit was #: 22 Starting weight: 253 lbs Starting date: 12/14/2017 Today's weight: 268 lbs Today's date: 10/08/2020 Total lbs lost to date: 5 Total lbs lost since last in-office visit: 0  Interim History: Autumn Curry reports that she is retaining a lot of fluid today in her bilateral lower extremities. She has not had shortness of breath, she is not using extra pillows to sleep, and she has not had a cough. She reports that her breakfast has been waffles some days, and she had sugary drinks on 2 occasions.  Subjective:   1. Essential hypertension Autumn Curry's blood pressure is controlled. She denies chest pain, shortness of breath, or palpitations.  2. Vitamin D deficiency Autumn Curry is on Vit D, and she is tolerating it well.  3. At risk for heart disease Autumn Curry is at a higher than average risk for cardiovascular disease due to obesity.   Assessment/Plan:   1. Essential hypertension Autumn Curry will continue her medications, and will continue working on healthy weight loss and exercise to improve blood pressure control. We will watch for signs of hypotension as she continues her lifestyle modifications.  2. Vitamin D deficiency Low Vitamin D level contributes to fatigue and are associated with obesity, breast, and colon cancer. We will refill prescription Vitamin D for 1 month. Autumn Curry will follow-up for routine testing of Vitamin D, at least 2-3 times per year to avoid over-replacement.  - Vitamin D, Ergocalciferol, (DRISDOL) 1.25 MG (50000 UNIT) CAPS capsule; Take 1 capsule (50,000 Units total) by mouth every 7 (seven) days.  Dispense: 4 capsule; Refill: 0  3. At  risk for heart disease Autumn Curry was given approximately 15 minutes of coronary artery disease prevention counseling today. She is 40 y.o. female and has risk factors for heart disease including obesity. We discussed intensive lifestyle modifications today with an emphasis on specific weight loss instructions and strategies.   Repetitive spaced learning was employed today to elicit superior memory formation and behavioral change.  4. Class 3 severe obesity with serious comorbidity and body mass index (BMI) of 45.0 to 49.9 in adult, unspecified obesity type (HCC) Autumn Curry is currently in the action stage of change. As such, her goal is to continue with weight loss efforts. She has agreed to the Category 2 Plan.   Exercise goals: No exercise has been prescribed at this time.  Behavioral modification strategies: increasing lean protein intake and decreasing simple carbohydrates.  Autumn Curry has agreed to follow-up with our clinic in 1 week. She was informed of the importance of frequent follow-up visits to maximize her success with intensive lifestyle modifications for her multiple health conditions.   Objective:   Blood pressure 125/76, pulse 80, temperature 98.3 F (36.8 C), height 5\' 2"  (1.575 m), weight 268 lb (121.6 kg), SpO2 96 %. Body mass index is 49.02 kg/m.  General: Cooperative, alert, well developed, in no acute distress. HEENT: Conjunctivae and lids unremarkable. Cardiovascular: Regular rhythm.  Lungs: Normal work of breathing. Neurologic: No focal deficits.   Lab Results  Component Value Date   CREATININE 0.76 03/15/2020   BUN 12 03/15/2020   NA 138 03/15/2020   K 3.9 03/15/2020   CL  98 03/15/2020   CO2 25 03/15/2020   Lab Results  Component Value Date   ALT 15 03/15/2020   AST 13 03/15/2020   ALKPHOS 86 03/15/2020   BILITOT 0.3 03/15/2020   Lab Results  Component Value Date   HGBA1C 5.7 (H) 03/15/2020   HGBA1C 5.6 10/10/2019   HGBA1C 5.6 05/26/2019   HGBA1C 5.7  (H) 02/22/2019   HGBA1C 5.6 09/13/2018   Lab Results  Component Value Date   INSULIN 51.3 (H) 03/15/2020   INSULIN 30.3 (H) 10/10/2019   INSULIN 60.9 (H) 02/22/2019   INSULIN 24.8 09/13/2018   INSULIN 28.7 (H) 04/22/2018   Lab Results  Component Value Date   TSH 1.89 05/26/2019   Lab Results  Component Value Date   CHOL 223 (H) 05/07/2020   HDL 43 (L) 05/07/2020   LDLCALC 156 (H) 05/07/2020   TRIG 121 05/07/2020   CHOLHDL 5.2 (H) 05/07/2020   Lab Results  Component Value Date   WBC 7.3 05/07/2020   HGB 12.7 05/07/2020   HCT 38.0 05/07/2020   MCV 90.3 05/07/2020   PLT 339 05/07/2020   No results found for: IRON, TIBC, FERRITIN  Attestation Statements:   Reviewed by clinician on day of visit: allergies, medications, problem list, medical history, surgical history, family history, social history, and previous encounter notes.   Wilhemena Durie, am acting as transcriptionist for [employer], PA-C.  I have reviewed the above documentation for accuracy and completeness, and I agree with the above. Abby Potash, PA-C

## 2020-10-11 ENCOUNTER — Encounter (INDEPENDENT_AMBULATORY_CARE_PROVIDER_SITE_OTHER): Payer: Self-pay

## 2020-10-16 ENCOUNTER — Ambulatory Visit (INDEPENDENT_AMBULATORY_CARE_PROVIDER_SITE_OTHER): Payer: BC Managed Care – PPO | Admitting: Physician Assistant

## 2020-10-23 ENCOUNTER — Other Ambulatory Visit: Payer: Self-pay | Admitting: Family Medicine

## 2020-10-23 DIAGNOSIS — I10 Essential (primary) hypertension: Secondary | ICD-10-CM

## 2020-11-05 ENCOUNTER — Ambulatory Visit (INDEPENDENT_AMBULATORY_CARE_PROVIDER_SITE_OTHER): Payer: BC Managed Care – PPO | Admitting: Physician Assistant

## 2020-12-31 DIAGNOSIS — Z20822 Contact with and (suspected) exposure to covid-19: Secondary | ICD-10-CM | POA: Diagnosis not present

## 2021-01-28 ENCOUNTER — Other Ambulatory Visit: Payer: Self-pay | Admitting: Family Medicine

## 2021-01-28 DIAGNOSIS — I10 Essential (primary) hypertension: Secondary | ICD-10-CM

## 2021-02-19 ENCOUNTER — Encounter (INDEPENDENT_AMBULATORY_CARE_PROVIDER_SITE_OTHER): Payer: Self-pay | Admitting: Adult Health

## 2021-02-19 ENCOUNTER — Ambulatory Visit (INDEPENDENT_AMBULATORY_CARE_PROVIDER_SITE_OTHER): Payer: BC Managed Care – PPO | Admitting: Adult Health

## 2021-02-19 ENCOUNTER — Other Ambulatory Visit: Payer: Self-pay

## 2021-02-19 VITALS — BP 125/74 | HR 82 | Temp 98.0°F | Ht 62.0 in | Wt 273.0 lb

## 2021-02-19 DIAGNOSIS — Z9189 Other specified personal risk factors, not elsewhere classified: Secondary | ICD-10-CM

## 2021-02-19 DIAGNOSIS — E785 Hyperlipidemia, unspecified: Secondary | ICD-10-CM | POA: Diagnosis not present

## 2021-02-19 DIAGNOSIS — E559 Vitamin D deficiency, unspecified: Secondary | ICD-10-CM

## 2021-02-19 DIAGNOSIS — R7303 Prediabetes: Secondary | ICD-10-CM | POA: Diagnosis not present

## 2021-02-19 DIAGNOSIS — Z6841 Body Mass Index (BMI) 40.0 and over, adult: Secondary | ICD-10-CM

## 2021-02-19 MED ORDER — VITAMIN D (ERGOCALCIFEROL) 1.25 MG (50000 UNIT) PO CAPS: 50000.0000 [IU] | ORAL_CAPSULE | ORAL | 0 refills | Status: DC

## 2021-02-19 MED ORDER — OZEMPIC (0.25 OR 0.5 MG/DOSE) 2 MG/1.5ML ~~LOC~~ SOPN: 0.2500 mg | PEN_INJECTOR | SUBCUTANEOUS | 0 refills | Status: DC

## 2021-02-20 LAB — COMPREHENSIVE METABOLIC PANEL
ALT: 20 IU/L (ref 0–32)
AST: 17 IU/L (ref 0–40)
Albumin/Globulin Ratio: 1.4 (ref 1.2–2.2)
Albumin: 4.2 g/dL (ref 3.8–4.8)
Alkaline Phosphatase: 92 IU/L (ref 44–121)
BUN/Creatinine Ratio: 26 — ABNORMAL HIGH (ref 9–23)
BUN: 18 mg/dL (ref 6–24)
Bilirubin Total: 0.3 mg/dL (ref 0.0–1.2)
CO2: 23 mmol/L (ref 20–29)
Calcium: 9.5 mg/dL (ref 8.7–10.2)
Chloride: 98 mmol/L (ref 96–106)
Creatinine, Ser: 0.68 mg/dL (ref 0.57–1.00)
Globulin, Total: 3.1 g/dL (ref 1.5–4.5)
Glucose: 107 mg/dL — ABNORMAL HIGH (ref 65–99)
Potassium: 4 mmol/L (ref 3.5–5.2)
Sodium: 138 mmol/L (ref 134–144)
Total Protein: 7.3 g/dL (ref 6.0–8.5)
eGFR: 113 mL/min/{1.73_m2} (ref >59)

## 2021-02-20 LAB — LIPID PANEL
Chol/HDL Ratio: 5 ratio — ABNORMAL HIGH (ref 0.0–4.4)
Cholesterol, Total: 223 mg/dL — ABNORMAL HIGH (ref 100–199)
HDL: 45 mg/dL (ref >39)
LDL Chol Calc (NIH): 154 mg/dL — ABNORMAL HIGH (ref 0–99)
Triglycerides: 132 mg/dL (ref 0–149)
VLDL Cholesterol Cal: 24 mg/dL (ref 5–40)

## 2021-02-20 LAB — HEMOGLOBIN A1C
Est. average glucose Bld gHb Est-mCnc: 123 mg/dL
Hgb A1c MFr Bld: 5.9 % — ABNORMAL HIGH (ref 4.8–5.6)

## 2021-02-20 LAB — VITAMIN D 25 HYDROXY (VIT D DEFICIENCY, FRACTURES): Vit D, 25-Hydroxy: 29 ng/mL — ABNORMAL LOW (ref 30.0–100.0)

## 2021-02-20 LAB — INSULIN, RANDOM: INSULIN: 49.4 u[IU]/mL — ABNORMAL HIGH (ref 2.6–24.9)

## 2021-02-20 NOTE — Progress Notes (Signed)
Chief Complaint:   OBESITY Autumn Curry is here to discuss her progress with her obesity treatment plan along with follow-up of her obesity related diagnoses. Autumn Curry is on the Category 2 Plan and states she is following her eating plan approximately 0% of the time. Autumn Curry states she is not exercising regularly.  Today's visit was #: 33 Starting weight: 253 lbs Starting date: 12/14/2017 Today's weight: 273 lbs Today's date: 02/19/2021 Total lbs lost to date: 0 Total lbs lost since last in-office visit: 0  Interim History: Autumn Curry is experiencing frustration and lack of motivation with the eating plan.   She reports increased fatigue and symptoms of hypoglycemia.   Her father had T2D and passed away 6 years ago.  He had his first MI in his 45s.  Of note:  Last office visit was on 10/08/2020.  Subjective:   1. Vitamin D deficiency Vitamin D level was 30.7 on 03/15/2020 - below goal of 50.  She is currently taking prescription vitamin D 50,000 IU each week. Her last dose was >3 months ago.  Lab Results  Component Value Date   VD25OH 29.0 (L) 02/19/2021   VD25OH 30.7 03/15/2020   VD25OH 56.1 10/10/2019   2. Pre-diabetes On 03/15/2020, A1c was 5.7 with a quite elevated insulin level of 51.3.   She has been off Ozempic 0.25 mg for a few months due to lack of regular follow-up in clinic.  Lab Results  Component Value Date   HGBA1C 5.9 (H) 02/19/2021   Lab Results  Component Value Date   INSULIN WILL FOLLOW 02/19/2021   INSULIN 51.3 (H) 03/15/2020   INSULIN 30.3 (H) 10/10/2019   INSULIN 60.9 (H) 02/22/2019   INSULIN 24.8 09/13/2018   3. Hyperlipidemia, unspecified hyperlipidemia type She has never been on statin therapy.  She wants to avoid statins. Her father had MI in his 68s.  Lab Results  Component Value Date   ALT 20 02/19/2021   AST 17 02/19/2021   ALKPHOS 92 02/19/2021   BILITOT 0.3 02/19/2021   Lab Results  Component Value Date   CHOL 223 (H) 02/19/2021   HDL 45  02/19/2021   LDLCALC 154 (H) 02/19/2021   TRIG 132 02/19/2021   CHOLHDL 5.0 (H) 02/19/2021   4. At risk for nausea Autumn Curry is at risk for nausea due to being started on Ozempic for prediabetes.  Assessment/Plan:   1. Vitamin D deficiency Check labs today.  Refill vitamin D, as per below.  - VITAMIN D 25 Hydroxy (Vit-D Deficiency, Fractures) - Refill Vitamin D, Ergocalciferol, (DRISDOL) 1.25 MG (50000 UNIT) CAPS capsule; Take 1 capsule (50,000 Units total) by mouth every 7 (seven) days.  Dispense: 4 capsule; Refill: 0  2. Pre-diabetes Check labs today.  Refill Ozempic 0.25 mg once weekly.  - Hemoglobin A1c - Insulin, random - Refill Semaglutide,0.25 or 0.'5MG'$ /DOS, (OZEMPIC, 0.25 OR 0.5 MG/DOSE,) 2 MG/1.5ML SOPN; Inject 0.25 mg into the skin once a week.  Dispense: 1 mL; Refill: 0  3. Hyperlipidemia, unspecified hyperlipidemia type Check labs today.  - Comprehensive metabolic panel - Lipid panel  4. At risk for nausea Autumn Curry was given approximately 15 minutes of nausea prevention counseling today. Autumn Curry is at risk for nausea due to her new or current medication. She was encouraged to titrate her medication slowly, make sure to stay hydrated, eat smaller portions throughout the day, and avoid high fat meals.    5. Class 3 severe obesity with serious comorbidity and body mass index (  BMI) of 50.0 to 59.9 in adult, unspecified obesity type (HCC)  Autumn Curry is currently in the action stage of change. As such, her goal is to continue with weight loss efforts. She has agreed to the Category 2 Plan.   Exercise goals:  As is.  Behavioral modification strategies: increasing lean protein intake, decreasing simple carbohydrates, meal planning and cooking strategies, keeping healthy foods in the home, and planning for success.  Autumn Curry has agreed to follow-up with our clinic in 2 weeks. She was informed of the importance of frequent follow-up visits to maximize her success with  intensive lifestyle modifications for her multiple health conditions.   Autumn Curry was informed we would discuss her lab results at her next visit unless there is a critical issue that needs to be addressed sooner. Autumn Curry agreed to keep her next visit at the agreed upon time to discuss these results.  Objective:   Blood pressure 125/74, pulse 82, temperature 98 F (36.7 C), height '5\' 2"'$  (1.575 m), weight 273 lb (123.8 kg), SpO2 96 %. Body mass index is 49.93 kg/m.  General: Cooperative, alert, well developed, in no acute distress. HEENT: Conjunctivae and lids unremarkable. Cardiovascular: Regular rhythm.  Lungs: Normal work of breathing. Neurologic: No focal deficits.   Lab Results  Component Value Date   CREATININE 0.68 02/19/2021   BUN 18 02/19/2021   NA 138 02/19/2021   K 4.0 02/19/2021   CL 98 02/19/2021   CO2 23 02/19/2021   Lab Results  Component Value Date   ALT 20 02/19/2021   AST 17 02/19/2021   ALKPHOS 92 02/19/2021   BILITOT 0.3 02/19/2021   Lab Results  Component Value Date   HGBA1C 5.9 (H) 02/19/2021   HGBA1C 5.7 (H) 03/15/2020   HGBA1C 5.6 10/10/2019   HGBA1C 5.6 05/26/2019   HGBA1C 5.7 (H) 02/22/2019   Lab Results  Component Value Date   INSULIN WILL FOLLOW 02/19/2021   INSULIN 51.3 (H) 03/15/2020   INSULIN 30.3 (H) 10/10/2019   INSULIN 60.9 (H) 02/22/2019   INSULIN 24.8 09/13/2018   Lab Results  Component Value Date   TSH 1.89 05/26/2019   Lab Results  Component Value Date   CHOL 223 (H) 02/19/2021   HDL 45 02/19/2021   LDLCALC 154 (H) 02/19/2021   TRIG 132 02/19/2021   CHOLHDL 5.0 (H) 02/19/2021   Lab Results  Component Value Date   VD25OH 29.0 (L) 02/19/2021   VD25OH 30.7 03/15/2020   VD25OH 56.1 10/10/2019   Lab Results  Component Value Date   WBC 7.3 05/07/2020   HGB 12.7 05/07/2020   HCT 38.0 05/07/2020   MCV 90.3 05/07/2020   PLT 339 05/07/2020   Attestation Statements:   Reviewed by clinician on day of visit: allergies,  medications, problem list, medical history, surgical history, family history, social history, and previous encounter notes.  I, Water quality scientist, CMA, am acting as Location manager for Mina Marble, NP.  I have reviewed the above documentation for accuracy and completeness, and I agree with the above. -  Naveena Eyman d. Giancarlo Askren, NP-C

## 2021-02-28 ENCOUNTER — Other Ambulatory Visit: Payer: Self-pay | Admitting: Family Medicine

## 2021-02-28 DIAGNOSIS — I10 Essential (primary) hypertension: Secondary | ICD-10-CM

## 2021-03-05 ENCOUNTER — Ambulatory Visit (INDEPENDENT_AMBULATORY_CARE_PROVIDER_SITE_OTHER): Payer: BC Managed Care – PPO | Admitting: Adult Health

## 2021-03-13 ENCOUNTER — Other Ambulatory Visit: Payer: Self-pay | Admitting: Family Medicine

## 2021-03-13 DIAGNOSIS — I10 Essential (primary) hypertension: Secondary | ICD-10-CM

## 2021-05-23 ENCOUNTER — Other Ambulatory Visit: Payer: BC Managed Care – PPO

## 2021-05-24 DIAGNOSIS — Z23 Encounter for immunization: Secondary | ICD-10-CM | POA: Diagnosis not present

## 2021-05-27 ENCOUNTER — Other Ambulatory Visit: Payer: Self-pay | Admitting: Internal Medicine

## 2021-05-27 DIAGNOSIS — B20 Human immunodeficiency virus [HIV] disease: Secondary | ICD-10-CM

## 2021-06-03 ENCOUNTER — Encounter: Payer: Self-pay | Admitting: Family Medicine

## 2021-06-03 ENCOUNTER — Other Ambulatory Visit: Payer: Self-pay

## 2021-06-03 ENCOUNTER — Ambulatory Visit (INDEPENDENT_AMBULATORY_CARE_PROVIDER_SITE_OTHER): Payer: BC Managed Care – PPO | Admitting: Family Medicine

## 2021-06-03 VITALS — BP 128/88 | HR 72 | Temp 98.2°F | Resp 18 | Ht 62.0 in | Wt 276.0 lb

## 2021-06-03 DIAGNOSIS — Z0000 Encounter for general adult medical examination without abnormal findings: Secondary | ICD-10-CM

## 2021-06-03 DIAGNOSIS — I10 Essential (primary) hypertension: Secondary | ICD-10-CM

## 2021-06-03 MED ORDER — HYDROCHLOROTHIAZIDE 25 MG PO TABS: ORAL_TABLET | ORAL | 1 refills | Status: DC

## 2021-06-03 NOTE — Patient Instructions (Signed)
Preventive Care 92-41 Years Old, Female Preventive care refers to lifestyle choices and visits with your health care provider that can promote health and wellness. Preventive care visits are also called wellness exams. What can I expect for my preventive care visit? Counseling Your health care provider may ask you questions about your: Medical history, including: Past medical problems. Family medical history. Pregnancy history. Current health, including: Menstrual cycle. Method of birth control. Emotional well-being. Home life and relationship well-being. Sexual activity and sexual health. Lifestyle, including: Alcohol, nicotine or tobacco, and drug use. Access to firearms. Diet, exercise, and sleep habits. Work and work Statistician. Sunscreen use. Safety issues such as seatbelt and bike helmet use. Physical exam Your health care provider will check your: Height and weight. These may be used to calculate your BMI (body mass index). BMI is a measurement that tells if you are at a healthy weight. Waist circumference. This measures the distance around your waistline. This measurement also tells if you are at a healthy weight and may help predict your risk of certain diseases, such as type 2 diabetes and high blood pressure. Heart rate and blood pressure. Body temperature. Skin for abnormal spots. What immunizations do I need? Vaccines are usually given at various ages, according to a schedule. Your health care provider will recommend vaccines for you based on your age, medical history, and lifestyle or other factors, such as travel or where you work. What tests do I need? Screening Your health care provider may recommend screening tests for certain conditions. This may include: Lipid and cholesterol levels. Diabetes screening. This is done by checking your blood sugar (glucose) after you have not eaten for a while (fasting). Pelvic exam and Pap test. Hepatitis B test. Hepatitis C  test. HIV (human immunodeficiency virus) test. STI (sexually transmitted infection) testing, if you are at risk. Lung cancer screening. Colorectal cancer screening. Mammogram. Talk with your health care provider about when you should start having regular mammograms. This may depend on whether you have a family history of breast cancer. BRCA-related cancer screening. This may be done if you have a family history of breast, ovarian, tubal, or peritoneal cancers. Bone density scan. This is done to screen for osteoporosis. Talk with your health care provider about your test results, treatment options, and if necessary, the need for more tests. Follow these instructions at home: Eating and drinking  Eat a diet that includes fresh fruits and vegetables, whole grains, lean protein, and low-fat dairy products. Take vitamin and mineral supplements as recommended by your health care provider. Do not drink alcohol if: Your health care provider tells you not to drink. You are pregnant, may be pregnant, or are planning to become pregnant. If you drink alcohol: Limit how much you have to 0-1 drink a day. Know how much alcohol is in your drink. In the [place] one drink equals one 12 oz bottle of beer (355 mL), one 5 oz glass of wine (148 mL), or one 1 oz glass of hard liquor (44 mL). Lifestyle Brush your teeth every morning and night with fluoride toothpaste. Floss one time each day. Exercise for at least 30 minutes 5 or more days each week. Do not use any products that contain nicotine or tobacco. These products include cigarettes, chewing tobacco, and vaping devices, such as e-cigarettes. If you need help quitting, ask your health care provider. Do not use drugs. If you are sexually active, practice safe sex. Use a condom or other form of protection to prevent  STIs. If you do not wish to become pregnant, use a form of birth control. If you plan to become pregnant, see your health care provider for a  prepregnancy visit. Take aspirin only as told by your health care provider. Make sure that you understand how much to take and what form to take. Work with your health care provider to find out whether it is safe and beneficial for you to take aspirin daily. Find healthy ways to manage stress, such as: Meditation, yoga, or listening to music. Journaling. Talking to a trusted person. Spending time with friends and family. Minimize exposure to UV radiation to reduce your risk of skin cancer. Safety Always wear your seat belt while driving or riding in a vehicle. Do not drive: If you have been drinking alcohol. Do not ride with someone who has been drinking. When you are tired or distracted. While texting. If you have been using any mind-altering substances or drugs. Wear a helmet and other protective equipment during sports activities. If you have firearms in your house, make sure you follow all gun safety procedures. Seek help if you have been physically or sexually abused. What's next? Visit your health care provider once a year for an annual wellness visit. Ask your health care provider how often you should have your eyes and teeth checked. Stay up to date on all vaccines. This information is not intended to replace advice given to you by your health care provider. Make sure you discuss any questions you have with your health care provider. Document Revised: 12/26/2020 Document Reviewed: 12/26/2020 Elsevier Patient Education  [employer].

## 2021-06-03 NOTE — Progress Notes (Signed)
Subjective:     Autumn Curry is a 40 y.o. female and is here for a comprehensive physical exam. The patient reports problems - menstrual cycle slowing down and time between getting them is increasing.    .  Social History   Socioeconomic History   Marital status: Married    Spouse name: Triva Hueber   Number of children: 1   Years of education: Not on file   Highest education level: Not on file  Occupational History   Occupation: Pharmacist, hospital    Comment: head start   Tobacco Use   Smoking status: Never   Smokeless tobacco: Never  Vaping Use   Vaping Use: Never used  Substance and Sexual Activity   Alcohol use: Yes    Alcohol/week: 0.0 standard drinks    Comment: rarely   Drug use: No   Sexual activity: Yes    Partners: Male    Birth control/protection: Condom    Comment: given condoms 05/2020  Other Topics Concern   Not on file  Social History Narrative   Not on file   Social Determinants of Health   Financial Resource Strain: Not on file  Food Insecurity: Not on file  Transportation Needs: Not on file  Physical Activity: Not on file  Stress: Not on file  Social Connections: Not on file  Intimate Partner Violence: Not on file   Health Maintenance  Topic Date Due   URINE MICROALBUMIN  Never done   COVID-19 Vaccine (3 - Pfizer risk series) 02/27/2020   PAP SMEAR-Modifier  05/25/2022   TETANUS/TDAP  07/11/2026   INFLUENZA VACCINE  Completed   Hepatitis C Screening  Completed   HIV Screening  Completed   HPV VACCINES  Aged Out   Pneumococcal Vaccine 19-51 Years old  Discontinued    The following portions of the patient's history were reviewed and updated as appropriate: She  has a past medical history of Absence of menstruation, Carbuncle and furuncle of unspecified site, Elevated blood pressure reading without diagnosis of hypertension, Encounter for long-term (current) use of other medications, Headache(784.0), HIV infection ([place]) (2008), Hyperlipidemia,  Hypertension, Routine general medical examination at a health care facility, Screening for malignant neoplasm of the cervix, and Syphilis, unspecified. She does not have any pertinent problems on file. She  has a past surgical history that includes No past surgeries. Her family history includes Coronary artery disease in her father; Depression in her mother; Diabetes in her father; Heart disease in her father; Hyperlipidemia in her father and mother; Hypertension in her mother; Kidney disease in her father; Stroke in her father. She  reports that she has never smoked. She has never used smokeless tobacco. She reports current alcohol use. She reports that she does not use drugs. She has a current medication list which includes the following prescription(s): aspirin-acetaminophen-caffeine, atenolol, ibuprofen, loratadine, alive womens energy, symtuza, trimethoprim-polymyxin b, valacyclovir, vitamin d (ergocalciferol), and hydrochlorothiazide. Current Outpatient Medications on File Prior to Visit  Medication Sig Dispense Refill   aspirin-acetaminophen-caffeine (EXCEDRIN MIGRAINE) 250-250-65 MG tablet Take by mouth every 6 (six) hours as needed for headache.     atenolol (TENORMIN) 25 MG tablet Take 1 tablet by mouth once daily 90 tablet 0   Ibuprofen 200 MG CAPS Take 200 mg by mouth every 6 (six) hours as needed.      loratadine (CLARITIN) 10 MG tablet Take 10 mg by mouth daily.     Multiple Vitamins-Minerals (ALIVE WOMENS ENERGY) TABS Take 1 tablet by mouth daily.  SYMTUZA 800-150-200-10 MG TABS TAKE 1 TABLET BY MOUTH DAILY WITH BREAKFAST. 30 tablet 0   trimethoprim-polymyxin b (POLYTRIM) ophthalmic solution Place 2 drops into the right eye every 6 (six) hours. 10 mL 0   valACYclovir (VALTREX) 500 MG tablet Take 1 tablet (500 mg total) by mouth 2 (two) times daily. (Patient taking differently: Take 500 mg by mouth 2 (two) times daily as needed.) 10 tablet 5   Vitamin D, Ergocalciferol, (DRISDOL)  1.25 MG (50000 UNIT) CAPS capsule Take 1 capsule (50,000 Units total) by mouth every 7 (seven) days. 4 capsule 0   No current facility-administered medications on file prior to visit.   She has No Known Allergies..  Review of Systems Review of Systems  Constitutional: Negative for activity change, appetite change and fatigue.  HENT: Negative for hearing loss, congestion, tinnitus and ear discharge.  dentist q71m Eyes: Negative for visual disturbance (see optho q1y -- vision corrected to 20/20 with glasses).  Respiratory: Negative for cough, chest tightness and shortness of breath.   Cardiovascular: Negative for chest pain, palpitations and leg swelling.  Gastrointestinal: Negative for abdominal pain, diarrhea, constipation and abdominal distention.  Genitourinary: Negative for urgency, frequency, decreased urine volume and difficulty urinating.  Musculoskeletal: Negative for back pain, arthralgias and gait problem.  Skin: Negative for color change, pallor and rash.  Neurological: Negative for dizziness, light-headedness, numbness and headaches.  Hematological: Negative for adenopathy. Does not bruise/bleed easily.  Psychiatric/Behavioral: Negative for suicidal ideas, confusion, sleep disturbance, self-injury, dysphoric mood, decreased concentration and agitation.      Objective:    BP 128/88 (BP Location: Right Arm, Patient Position: Sitting, Cuff Size: Large)   Pulse 72   Temp 98.2 F (36.8 C) (Oral)   Resp 18   Ht 5\' 2"  (1.575 m)   Wt 276 lb (125.2 kg)   SpO2 98%   BMI 50.48 kg/m  General appearance: alert, cooperative, appears stated age, no distress, and mildly obese Head: Normocephalic, without obvious abnormality, atraumatic Eyes: conjunctivae/corneas clear. PERRL, EOM's intact. Fundi benign. Ears: normal TM's and external ear canals both ears Nose: Nares normal. Septum midline. Mucosa normal. No drainage or sinus tenderness. Throat: lips, mucosa, and tongue normal;  teeth and gums normal Neck: no adenopathy, no carotid bruit, no JVD, supple, symmetrical, trachea midline, and thyroid not enlarged, symmetric, no tenderness/mass/nodules Back: symmetric, no curvature. ROM normal. No CVA tenderness. Lungs: clear to auscultation bilaterally Heart: regular rate and rhythm, S1, S2 normal, no murmur, click, rub or gallop Abdomen: soft, non-tender; bowel sounds normal; no masses,  no organomegaly Extremities: extremities normal, atraumatic, no cyanosis or edema Pulses: 2+ and symmetric Skin: Skin color, texture, turgor normal. No rashes or lesions Lymph nodes: Cervical, supraclavicular, and axillary nodes normal. Neurologic: Alert and oriented X 3, normal strength and tone. Normal symmetric reflexes. Normal coordination and gait    Assessment:    Healthy female exam.      Plan:    Ghm utd Check labs  See After Visit Summary for Counseling Recommendations   .1. Essential hypertension Well controlled, no changes to meds. Encouraged heart healthy diet such as the DASH diet and exercise as tolerated.   - hydrochlorothiazide (HYDRODIURIL) 25 MG tablet; TAKE 1 TABLET BY MOUTH ONCE DAILY  Dispense: 90 tablet; Refill: 1 - CBC with Differential/Platelet - Comprehensive metabolic panel - Lipid panel - Microalbumin / creatinine urine ratio  2. Morbid obesity ([place])  - CBC with Differential/Platelet - Comprehensive metabolic panel - Hemoglobin A1c - TSH - VITAMIN  D 25 Hydroxy (Vit-D Deficiency, Fractures) - Insulin, random - Microalbumin / creatinine urine ratio  3. Preventative health care Ghm utd Check labs  - CBC with Differential/Platelet - Comprehensive metabolic panel - Hemoglobin A1c - TSH - VITAMIN D 25 Hydroxy (Vit-D Deficiency, Fractures) - Insulin, random - Lipid panel - Microalbumin / creatinine urine ratio

## 2021-06-04 LAB — MICROALBUMIN / CREATININE URINE RATIO
Creatinine,U: 115.9 mg/dL
Microalb Creat Ratio: 10.8 mg/g (ref 0.0–30.0)
Microalb, Ur: 12.5 mg/dL — ABNORMAL HIGH (ref 0.0–1.9)

## 2021-06-04 LAB — CBC WITH DIFFERENTIAL/PLATELET
Basophils Absolute: 0.1 10*3/uL (ref 0.0–0.1)
Basophils Relative: 1.2 % (ref 0.0–3.0)
Eosinophils Absolute: 0.1 10*3/uL (ref 0.0–0.7)
Eosinophils Relative: 1.1 % (ref 0.0–5.0)
HCT: 36.5 % (ref 36.0–46.0)
Hemoglobin: 12.1 g/dL (ref 12.0–15.0)
Lymphocytes Relative: 44.2 % (ref 12.0–46.0)
Lymphs Abs: 2.8 10*3/uL (ref 0.7–4.0)
MCHC: 33.2 g/dL (ref 30.0–36.0)
MCV: 91.8 fl (ref 78.0–100.0)
Monocytes Absolute: 0.5 10*3/uL (ref 0.1–1.0)
Monocytes Relative: 7.9 % (ref 3.0–12.0)
Neutro Abs: 2.9 10*3/uL (ref 1.4–7.7)
Neutrophils Relative %: 45.6 % (ref 43.0–77.0)
Platelets: 322 10*3/uL (ref 150.0–400.0)
RBC: 3.97 Mil/uL (ref 3.87–5.11)
RDW: 13.4 % (ref 11.5–15.5)
WBC: 6.3 10*3/uL (ref 4.0–10.5)

## 2021-06-04 LAB — COMPREHENSIVE METABOLIC PANEL
ALT: 13 U/L (ref 0–35)
AST: 13 U/L (ref 0–37)
Albumin: 4 g/dL (ref 3.5–5.2)
Alkaline Phosphatase: 73 U/L (ref 39–117)
BUN: 16 mg/dL (ref 6–23)
CO2: 31 mEq/L (ref 19–32)
Calcium: 9.5 mg/dL (ref 8.4–10.5)
Chloride: 98 mEq/L (ref 96–112)
Creatinine, Ser: 0.91 mg/dL (ref 0.40–1.20)
GFR: 78.54 mL/min (ref >60.00)
Glucose, Bld: 81 mg/dL (ref 70–99)
Potassium: 3.7 mEq/L (ref 3.5–5.1)
Sodium: 137 mEq/L (ref 135–145)
Total Bilirubin: 0.3 mg/dL (ref 0.2–1.2)
Total Protein: 7.2 g/dL (ref 6.0–8.3)

## 2021-06-04 LAB — VITAMIN D 25 HYDROXY (VIT D DEFICIENCY, FRACTURES): VITD: 28.02 ng/mL — ABNORMAL LOW (ref 30.00–100.00)

## 2021-06-04 LAB — LIPID PANEL
Cholesterol: 218 mg/dL — ABNORMAL HIGH (ref 0–200)
HDL: 41 mg/dL (ref >39.00)
LDL Cholesterol: 147 mg/dL — ABNORMAL HIGH (ref 0–99)
NonHDL: 177.19
Total CHOL/HDL Ratio: 5
Triglycerides: 149 mg/dL (ref 0.0–149.0)
VLDL: 29.8 mg/dL (ref 0.0–40.0)

## 2021-06-04 LAB — TSH: TSH: 2.17 u[IU]/mL (ref 0.35–5.50)

## 2021-06-04 LAB — INSULIN, RANDOM: Insulin: 54.5 u[IU]/mL — ABNORMAL HIGH

## 2021-06-04 LAB — HEMOGLOBIN A1C: Hgb A1c MFr Bld: 6 % (ref 4.6–6.5)

## 2021-06-05 ENCOUNTER — Other Ambulatory Visit: Payer: Self-pay

## 2021-06-05 ENCOUNTER — Other Ambulatory Visit: Payer: BC Managed Care – PPO

## 2021-06-05 DIAGNOSIS — Z113 Encounter for screening for infections with a predominantly sexual mode of transmission: Secondary | ICD-10-CM

## 2021-06-05 DIAGNOSIS — B20 Human immunodeficiency virus [HIV] disease: Secondary | ICD-10-CM

## 2021-06-05 DIAGNOSIS — Z79899 Other long term (current) drug therapy: Secondary | ICD-10-CM

## 2021-06-07 ENCOUNTER — Other Ambulatory Visit: Payer: Self-pay | Admitting: Family Medicine

## 2021-06-07 DIAGNOSIS — I10 Essential (primary) hypertension: Secondary | ICD-10-CM

## 2021-06-09 LAB — T-HELPER CELLS (CD4) COUNT (NOT AT ARMC)
Absolute CD4: 891 cells/uL (ref 490–1740)
CD4 T Helper %: 37 % (ref 30–61)
Total lymphocyte count: 2426 cells/uL (ref 850–3900)

## 2021-06-09 LAB — RPR: RPR Ser Ql: NONREACTIVE

## 2021-06-09 LAB — CBC WITH DIFFERENTIAL/PLATELET
Absolute Monocytes: 263 cells/uL (ref 200–950)
Basophils Absolute: 28 cells/uL (ref 0–200)
Basophils Relative: 0.5 %
Eosinophils Absolute: 101 cells/uL (ref 15–500)
Eosinophils Relative: 1.8 %
HCT: 36.7 % (ref 35.0–45.0)
Hemoglobin: 12.5 g/dL (ref 11.7–15.5)
Lymphs Abs: 2240 cells/uL (ref 850–3900)
MCH: 30.4 pg (ref 27.0–33.0)
MCHC: 34.1 g/dL (ref 32.0–36.0)
MCV: 89.3 fL (ref 80.0–100.0)
MPV: 10.4 fL (ref 7.5–12.5)
Monocytes Relative: 4.7 %
Neutro Abs: 2968 cells/uL (ref 1500–7800)
Neutrophils Relative %: 53 %
Platelets: 348 10*3/uL (ref 140–400)
RBC: 4.11 10*6/uL (ref 3.80–5.10)
RDW: 12.4 % (ref 11.0–15.0)
Total Lymphocyte: 40 %
WBC: 5.6 10*3/uL (ref 3.8–10.8)

## 2021-06-09 LAB — COMPLETE METABOLIC PANEL WITH GFR
AG Ratio: 1.3 (calc) (ref 1.0–2.5)
ALT: 14 U/L (ref 6–29)
AST: 12 U/L (ref 10–30)
Albumin: 3.9 g/dL (ref 3.6–5.1)
Alkaline phosphatase (APISO): 71 U/L (ref 31–125)
BUN: 15 mg/dL (ref 7–25)
CO2: 32 mmol/L (ref 20–32)
Calcium: 9.7 mg/dL (ref 8.6–10.2)
Chloride: 99 mmol/L (ref 98–110)
Creat: 0.81 mg/dL (ref 0.50–0.99)
Globulin: 3.1 g/dL (calc) (ref 1.9–3.7)
Glucose, Bld: 125 mg/dL — ABNORMAL HIGH (ref 65–99)
Potassium: 3.5 mmol/L (ref 3.5–5.3)
Sodium: 139 mmol/L (ref 135–146)
Total Bilirubin: 0.4 mg/dL (ref 0.2–1.2)
Total Protein: 7 g/dL (ref 6.1–8.1)
eGFR: 93 mL/min/{1.73_m2} (ref >=60)

## 2021-06-09 LAB — LIPID PANEL
Cholesterol: 209 mg/dL — ABNORMAL HIGH (ref <200)
HDL: 42 mg/dL — ABNORMAL LOW (ref >=50)
LDL Cholesterol (Calc): 140 mg/dL (calc) — ABNORMAL HIGH
Non-HDL Cholesterol (Calc): 167 mg/dL (calc) — ABNORMAL HIGH (ref <130)
Total CHOL/HDL Ratio: 5 (calc) — ABNORMAL HIGH (ref <5.0)
Triglycerides: 147 mg/dL (ref <150)

## 2021-06-09 LAB — HIV-1 RNA QUANT-NO REFLEX-BLD
HIV 1 RNA Quant: NOT DETECTED Copies/mL
HIV-1 RNA Quant, Log: NOT DETECTED Log cps/mL

## 2021-06-11 ENCOUNTER — Other Ambulatory Visit: Payer: Self-pay | Admitting: Family Medicine

## 2021-06-11 ENCOUNTER — Encounter: Payer: BC Managed Care – PPO | Admitting: Internal Medicine

## 2021-06-11 DIAGNOSIS — E1169 Type 2 diabetes mellitus with other specified complication: Secondary | ICD-10-CM

## 2021-06-11 DIAGNOSIS — E1165 Type 2 diabetes mellitus with hyperglycemia: Secondary | ICD-10-CM

## 2021-06-11 DIAGNOSIS — E785 Hyperlipidemia, unspecified: Secondary | ICD-10-CM

## 2021-07-02 ENCOUNTER — Other Ambulatory Visit: Payer: Self-pay

## 2021-07-02 ENCOUNTER — Ambulatory Visit: Payer: BC Managed Care – PPO | Admitting: Internal Medicine

## 2021-07-02 ENCOUNTER — Encounter: Payer: Self-pay | Admitting: Internal Medicine

## 2021-07-02 DIAGNOSIS — Z6841 Body Mass Index (BMI) 40.0 and over, adult: Secondary | ICD-10-CM | POA: Diagnosis not present

## 2021-07-02 DIAGNOSIS — B20 Human immunodeficiency virus [HIV] disease: Secondary | ICD-10-CM | POA: Diagnosis not present

## 2021-07-02 DIAGNOSIS — F419 Anxiety disorder, unspecified: Secondary | ICD-10-CM | POA: Diagnosis not present

## 2021-07-02 NOTE — Assessment & Plan Note (Signed)
Her infection remains under excellent, long-term control.  She will continue Symtuza and follow-up after lab work in 1 year.  I encouraged her to go ahead and get a COVID booster vaccine.

## 2021-07-02 NOTE — Progress Notes (Signed)
Patient Active Problem List   Diagnosis Date Noted   Human immunodeficiency virus (HIV) disease ([place]) 09/03/2006    Priority: High   Anxiety 07/02/2021   At risk for impaired metabolic function 54/62/7035   Prediabetes 09/15/2019   Vitamin D deficiency 09/15/2019   Class 3 severe obesity with serious comorbidity and body mass index (BMI) of 50.0 to 59.9 in adult [facility]) 12/23/2018   Insulin resistance 12/23/2018   Hyperlipidemia 01/26/2018   Shortness of breath on exertion 12/14/2017   Essential hypertension 12/14/2017   Preventative health care 08/18/2016   Genital herpes 02/16/2012   AMENORRHEA 08/31/2007   BOILS, RECURRENT 08/31/2007   HEADACHE 08/31/2007   SYPHILIS NOS 09/11/2006    Patient's Medications  New Prescriptions   No medications on file  Previous Medications   ASPIRIN-ACETAMINOPHEN-CAFFEINE (EXCEDRIN MIGRAINE) 250-250-65 MG TABLET    Take by mouth every 6 (six) hours as needed for headache.   ATENOLOL (TENORMIN) 25 MG TABLET    Take 1 tablet by mouth once daily   HYDROCHLOROTHIAZIDE (HYDRODIURIL) 25 MG TABLET    TAKE 1 TABLET BY MOUTH ONCE DAILY   IBUPROFEN 200 MG CAPS    Take 200 mg by mouth every 6 (six) hours as needed.    LORATADINE (CLARITIN) 10 MG TABLET    Take 10 mg by mouth daily.   MULTIPLE VITAMINS-MINERALS (ALIVE WOMENS ENERGY) TABS    Take 1 tablet by mouth daily.   SYMTUZA 800-150-200-10 MG TABS    TAKE 1 TABLET BY MOUTH DAILY WITH BREAKFAST.   TRIMETHOPRIM-POLYMYXIN B (POLYTRIM) OPHTHALMIC SOLUTION    Place 2 drops into the right eye every 6 (six) hours.   VALACYCLOVIR (VALTREX) 500 MG TABLET    Take 1 tablet (500 mg total) by mouth 2 (two) times daily.   VITAMIN D, ERGOCALCIFEROL, (DRISDOL) 1.25 MG (50000 UNIT) CAPS CAPSULE    Take 1 capsule (50,000 Units total) by mouth every 7 (seven) days.  Modified Medications   No medications on file  Discontinued Medications   No medications on file    Subjective: Autumn Curry is in for her  routine HIV follow-up visit.  She has not had any problems obtaining, taking or tolerating her Symtuza.  She does not recall missing any doses.  She had an annual influenza vaccine at work recently.  She took the initial 2 COVID vaccines.  She has not had a booster.  She developed a very mild COVID infection last year.  She is concerned about her weight.  She is not getting any regular exercise.  She denies feeling depressed but says that she does have chronic mild anxiety, mostly related to her 41 year old son who has autism.  She says that he is doing well.  He has a job and a girlfriend.  He wants to move out on his own.  Review of Systems: Review of Systems  Constitutional:  Negative for fever and weight loss.  Respiratory:  Negative for cough.   Cardiovascular:  Negative for chest pain.  Skin:  Negative for rash.  Psychiatric/Behavioral:  Negative for depression. The patient is nervous/anxious.    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  Hyperlipidemia    Hypertension    Routine general medical examination at a health care facility    Screening for malignant neoplasm of the cervix    Syphilis, unspecified     Social History   Tobacco Use   Smoking status: Never   Smokeless tobacco: Never  Vaping Use   Vaping Use: Never used  Substance Use Topics   Alcohol use: Yes    Alcohol/week: 0.0 standard drinks    Comment: rarely   Drug use: No    Family History  Problem Relation Age of Onset   Hyperlipidemia Mother    Hypertension Mother    Depression Mother    Coronary artery disease Father    Diabetes Father    Heart disease Father    Hyperlipidemia Father    Stroke Father    Kidney disease Father    Colon cancer  Neg Hx    Esophageal cancer Neg Hx    Colon polyps Neg Hx    Pancreatic cancer Neg Hx    Stomach cancer Neg Hx     No Known Allergies  Health Maintenance  Topic Date Due   COVID-19 Vaccine (3 - Pfizer risk series) 02/27/2020   PAP SMEAR-Modifier  05/25/2022   URINE MICROALBUMIN  06/03/2022   TETANUS/TDAP  07/11/2026   INFLUENZA VACCINE  Completed   Hepatitis C Screening  Completed   HIV Screening  Completed   HPV VACCINES  Aged Out   Pneumococcal Vaccine 39-45 Years old  Discontinued    Objective:  Vitals:   07/02/21 1516  BP: (!) 153/98  Pulse: 80  Temp: 97.9 F (36.6 C)  TempSrc: Oral  SpO2: 96%  Weight: 279 lb (126.6 kg)   Body mass index is 51.03 kg/m.  Physical Exam Constitutional:      Comments: Her spirits are good.  Her weight is up 15 pounds over the last year.  Cardiovascular:     Rate and Rhythm: Normal rate and regular rhythm.     Heart sounds: No murmur heard. Pulmonary:     Effort: Pulmonary effort is normal.     Breath sounds: Normal breath sounds.  Psychiatric:        Mood and Affect: Mood normal.    Lab Results Lab Results  Component Value Date   WBC 5.6 06/05/2021   HGB 12.5 06/05/2021   HCT 36.7 06/05/2021   MCV 89.3 06/05/2021   PLT 348 06/05/2021    Lab Results  Component Value Date   CREATININE 0.81 06/05/2021   BUN 15 06/05/2021   NA 139 06/05/2021   K 3.5 06/05/2021   CL 99 06/05/2021   CO2 32 06/05/2021    Lab Results  Component Value Date   ALT 14 06/05/2021   AST 12 06/05/2021   ALKPHOS 73 06/03/2021   BILITOT 0.4 06/05/2021    Lab Results  Component Value Date   CHOL 209 (H) 06/05/2021   HDL 42 (L) 06/05/2021   LDLCALC 140 (H) 06/05/2021   TRIG 147 06/05/2021   CHOLHDL 5.0 (H) 06/05/2021   Lab Results  Component Value Date   LABRPR NON-REACTIVE 06/05/2021   HIV 1 RNA Quant  Date Value  06/05/2021 Not Detected Copies/mL  05/07/2020 <20 Copies/mL (H)  04/26/2019 <20 NOT DETECTED copies/mL   CD4 T  Cell Abs (/uL)  Date Value  05/07/2020 1,053  04/26/2019 1,111  04/07/2018 1,180     Problem List Items Addressed This Visit       High  Human immunodeficiency virus (HIV) disease ([place])    Her infection remains under excellent, long-term control.  She will continue Symtuza and follow-up after lab work in 1 year.  I encouraged her to go ahead and get a COVID booster vaccine.        Unprioritized   Class 3 severe obesity with serious comorbidity and body mass index (BMI) of 50.0 to 59.9 in adult [facility])    Encouraged her to try to incorporate regular exercise into her daily routine.  This could help her with weight control and hyperglycemia.      Anxiety    Her told her that regular exercise can help with her anxiety as well.         Michel Bickers, MD [facility] [place] [facility] [phone] pager   [phone] cell 07/02/2021, 3:40 PM

## 2021-07-02 NOTE — Assessment & Plan Note (Signed)
Her told her that regular exercise can help with her anxiety as well.

## 2021-07-02 NOTE — Assessment & Plan Note (Signed)
Encouraged her to try to incorporate regular exercise into her daily routine.  This could help her with weight control and hyperglycemia.

## 2021-07-29 ENCOUNTER — Other Ambulatory Visit: Payer: Self-pay | Admitting: Internal Medicine

## 2021-07-29 DIAGNOSIS — B20 Human immunodeficiency virus [HIV] disease: Secondary | ICD-10-CM

## 2021-09-11 ENCOUNTER — Other Ambulatory Visit (HOSPITAL_COMMUNITY): Payer: Self-pay

## 2021-11-22 ENCOUNTER — Encounter: Payer: Self-pay | Admitting: Family Medicine

## 2021-11-22 ENCOUNTER — Ambulatory Visit: Payer: BC Managed Care – PPO | Admitting: Family Medicine

## 2021-11-22 DIAGNOSIS — N926 Irregular menstruation, unspecified: Secondary | ICD-10-CM

## 2021-11-22 DIAGNOSIS — I10 Essential (primary) hypertension: Secondary | ICD-10-CM

## 2021-11-22 DIAGNOSIS — E8881 Metabolic syndrome: Secondary | ICD-10-CM

## 2021-11-22 DIAGNOSIS — B20 Human immunodeficiency virus [HIV] disease: Secondary | ICD-10-CM

## 2021-11-22 NOTE — Assessment & Plan Note (Signed)
Per ID 

## 2021-11-22 NOTE — Patient Instructions (Signed)
Calorie Counting for Weight Loss ?Calories are units of energy. Your body needs a certain number of calories from food to keep going throughout the day. When you eat or drink more calories than your body needs, your body stores the extra calories mostly as fat. When you eat or drink fewer calories than your body needs, your body burns fat to get the energy it needs. ?Calorie counting means keeping track of how many calories you eat and drink each day. Calorie counting can be helpful if you need to lose weight. If you eat fewer calories than your body needs, you should lose weight. Ask your health care provider what a healthy weight is for you. ?For calorie counting to work, you will need to eat the right number of calories each day to lose a healthy amount of weight per week. A dietitian can help you figure out how many calories you need in a day and will suggest ways to reach your calorie goal. ?A healthy amount of weight to lose each week is usually 1-2 lb (0.5-0.9 kg). This usually means that your daily calorie intake should be reduced by 500-750 calories. ?Eating 1,200-1,500 calories a day can help most women lose weight. ?Eating 1,500-1,800 calories a day can help most men lose weight. ?What do I need to know about calorie counting? ?Work with your health care provider or dietitian to determine how many calories you should get each day. To meet your daily calorie goal, you will need to: ?Find out how many calories are in each food that you would like to eat. Try to do this before you eat. ?Decide how much of the food you plan to eat. ?Keep a food log. Do this by writing down what you ate and how many calories it had. ?To successfully lose weight, it is important to balance calorie counting with a healthy lifestyle that includes regular activity. ?Where do I find calorie information? ? ?The number of calories in a food can be found on a Nutrition Facts label. If a food does not have a Nutrition Facts label, try  to look up the calories online or ask your dietitian for help. ?Remember that calories are listed per serving. If you choose to have more than one serving of a food, you will have to multiply the calories per serving by the number of servings you plan to eat. For example, the label on a package of bread might say that a serving size is 1 slice and that there are 90 calories in a serving. If you eat 1 slice, you will have eaten 90 calories. If you eat 2 slices, you will have eaten 180 calories. ?How do I keep a food log? ?After each time that you eat, record the following in your food log as soon as possible: ?What you ate. Be sure to include toppings, sauces, and other extras on the food. ?How much you ate. This can be measured in cups, ounces, or number of items. ?How many calories were in each food and drink. ?The total number of calories in the food you ate. ?Keep your food log near you, such as in a pocket-sized notebook or on an app or website on your mobile phone. Some programs will calculate calories for you and show you how many calories you have left to meet your daily goal. ?What are some portion-control tips? ?Know how many calories are in a serving. This will help you know how many servings you can have of a certain  food. ?Use a measuring cup to measure serving sizes. You could also try weighing out portions on a kitchen scale. With time, you will be able to estimate serving sizes for some foods. ?Take time to put servings of different foods on your favorite plates or in your favorite bowls and cups so you know what a serving looks like. ?Try not to eat straight from a food's packaging, such as from a bag or box. Eating straight from the package makes it hard to see how much you are eating and can lead to overeating. Put the amount you would like to eat in a cup or on a plate to make sure you are eating the right portion. ?Use smaller plates, glasses, and bowls for smaller portions and to prevent  overeating. ?Try not to multitask. For example, avoid watching TV or using your computer while eating. If it is time to eat, sit down at a table and enjoy your food. This will help you recognize when you are full. It will also help you be more mindful of what and how much you are eating. ?What are tips for following this plan? ?Reading food labels ?Check the calorie count compared with the serving size. The serving size may be smaller than what you are used to eating. ?Check the source of the calories. Try to choose foods that are high in protein, fiber, and vitamins, and low in saturated fat, trans fat, and sodium. ?Shopping ?Read nutrition labels while you shop. This will help you make healthy decisions about which foods to buy. ?Pay attention to nutrition labels for low-fat or fat-free foods. These foods sometimes have the same number of calories or more calories than the full-fat versions. They also often have added sugar, starch, or salt to make up for flavor that was removed with the fat. ?Make a grocery list of lower-calorie foods and stick to it. ?Cooking ?Try to cook your favorite foods in a healthier way. For example, try baking instead of frying. ?Use low-fat dairy products. ?Meal planning ?Use more fruits and vegetables. One-half of your plate should be fruits and vegetables. ?Include lean proteins, such as chicken, [place], and fish. ?Lifestyle ?Each week, aim to do one of the following: ?150 minutes of moderate exercise, such as walking. ?75 minutes of vigorous exercise, such as running. ?General information ?Know how many calories are in the foods you eat most often. This will help you calculate calorie counts faster. ?Find a way of tracking calories that works for you. Get creative. Try different apps or programs if writing down calories does not work for you. ?What foods should I eat? ? ?Eat nutritious foods. It is better to have a nutritious, high-calorie food, such as an avocado, than a food with  few nutrients, such as a bag of potato chips. ?Use your calories on foods and drinks that will fill you up and will not leave you hungry soon after eating. ?Examples of foods that fill you up are nuts and nut butters, vegetables, lean proteins, and high-fiber foods such as whole grains. High-fiber foods are foods with more than 5 g of fiber per serving. ?Pay attention to calories in drinks. Low-calorie drinks include water and unsweetened drinks. ?The items listed above may not be a complete list of foods and beverages you can eat. Contact a dietitian for more information. ?What foods should I limit? ?Limit foods or drinks that are not good sources of vitamins, minerals, or protein or that are high in unhealthy fats. These  include: ?Candy. ?Other sweets. ?Sodas, specialty coffee drinks, alcohol, and juice. ?The items listed above may not be a complete list of foods and beverages you should avoid. Contact a dietitian for more information. ?How do I count calories when eating out? ?Pay attention to portions. Often, portions are much larger when eating out. Try these tips to keep portions smaller: ?Consider sharing a meal instead of getting your own. ?If you get your own meal, eat only half of it. Before you start eating, ask for a container and put half of your meal into it. ?When available, consider ordering smaller portions from the menu instead of full portions. ?Pay attention to your food and drink choices. Knowing the way food is cooked and what is included with the meal can help you eat fewer calories. ?If calories are listed on the menu, choose the lower-calorie options. ?Choose dishes that include vegetables, fruits, whole grains, low-fat dairy products, and lean proteins. ?Choose items that are boiled, broiled, grilled, or steamed. Avoid items that are buttered, battered, fried, or served with cream sauce. Items labeled as crispy are usually fried, unless stated otherwise. ?Choose water, low-fat milk,  unsweetened iced tea, or other drinks without added sugar. If you want an alcoholic beverage, choose a lower-calorie option, such as a glass of wine or light beer. ?Ask for dressings, sauces, and syrups on the side.

## 2021-11-22 NOTE — Assessment & Plan Note (Signed)
Well controlled, no changes to meds. Encouraged heart healthy diet such as the DASH diet and exercise as tolerated.  °

## 2021-11-22 NOTE — Progress Notes (Addendum)
? ?Subjective:  ? ?By signing my name below, I, Zite Okoli, attest that this documentation has been prepared under the direction and in the presence of Ann Held, DO. 11/22/2021 ? ? Patient ID: Autumn Curry, female    DOB: 01-Dec-1980, 41 y.o.   MRN: [id number] ? ?Chief Complaint  ?Patient presents with  ? Hypertension  ? Follow-up  ? ? ?HPI ?Patient is in today for an office visit and 6 months f/u. She is doing well at this time. ? ?Her blood pressure is stable at this visit. ?BP Readings from Last 3 Encounters:  ?11/22/21 128/88  ?07/02/21 (!) 153/98  ?06/03/21 128/88  ?  ?She is interested in weight loss medications. ? ?She reports that her menstrual cycles are still irregular. Adds they are often accompanied by migraines that even last for a week after the cycle.  They are often 60-90 days apart.  She notes that while on [facility] program, her cycles were regular but since she gained the weight back, they have become irregular. The periods are often not heavy.  ? ? ?Past Medical History:  ?Diagnosis Date  ? Absence of menstruation   ? Carbuncle and furuncle of unspecified site   ? Elevated blood pressure reading without diagnosis of hypertension   ? Encounter for long-term (current) use of other medications   ? Headache(784.0)   ? HIV infection ([place]) 2008  ? dx AIDS  ? Hyperlipidemia   ? Hypertension   ? Routine general medical examination at a health care facility   ? Screening for malignant neoplasm of the cervix   ? Syphilis, unspecified   ? ? ?Past Surgical History:  ?Procedure Laterality Date  ? NO PAST SURGERIES    ? ? ?Family History  ?Problem Relation Age of Onset  ? Hyperlipidemia Mother   ? Hypertension Mother   ? Depression Mother   ? Coronary artery disease Father   ? Diabetes Father   ? Heart disease Father   ? Hyperlipidemia Father   ? Stroke Father   ? Kidney disease Father   ? Colon cancer Neg Hx   ? Esophageal cancer Neg Hx   ? Colon polyps Neg Hx   ? Pancreatic  cancer Neg Hx   ? Stomach cancer Neg Hx   ? ? ?Social History  ? ?Socioeconomic History  ? Marital status: Married  ?  Spouse name: Ellagrace Yoshida  ? Number of children: 1  ? Years of education: Not on file  ? Highest education level: Not on file  ?Occupational History  ? 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 Smoking status: Never  ? Smokeless tobacco: Never  ?Vaping Use  ? Vaping Use: Never used  ?Substance and Sexual Activity  ? Alcohol use: Yes  ?  Alcohol/week: 0.0 standard drinks  ?  Comment: rarely  ? Drug use: No  ? Sexual activity: Yes  ?  Partners: Male  ?  Birth control/protection: Condom  ?  Comment: declined condoms  ?Other Topics Concern  ? Not on file  ?Social History Narrative  ? Not on file  ? ?Social Determinants of Health  ? ?Financial Resource Strain: Not on file  ?Food Insecurity: Not on file  ?Transportation Needs: Not on file  ?Physical Activity: Not on file  ?Stress: Not on file  ?Social Connections: Not on file  ?Intimate Partner Violence: Not on file  ? ? ?Outpatient Medications Prior to Visit  ?  Medication Sig Dispense Refill  ? aspirin-acetaminophen-caffeine (EXCEDRIN MIGRAINE) 250-250-65 MG tablet Take by mouth every 6 (six) hours as needed for headache.    ? atenolol (TENORMIN) 25 MG tablet Take 1 tablet by mouth once daily 90 tablet 1  ? hydrochlorothiazide (HYDRODIURIL) 25 MG tablet TAKE 1 TABLET BY MOUTH ONCE DAILY 90 tablet 1  ? Ibuprofen 200 MG CAPS Take 200 mg by mouth every 6 (six) hours as needed.     ? loratadine (CLARITIN) 10 MG tablet Take 10 mg by mouth daily.    ? Multiple Vitamins-Minerals (ALIVE WOMENS ENERGY) TABS Take 1 tablet by mouth daily.    ? SYMTUZA 800-150-200-10 MG TABS TAKE 1 TABLET BY MOUTH 1 TIME A DAY WITH BREAKFAST 30 tablet 5  ? trimethoprim-polymyxin b (POLYTRIM) ophthalmic solution Place 2 drops into the right eye every 6 (six) hours. 10 mL 0  ? valACYclovir (VALTREX) 500 MG tablet Take 1 tablet (500 mg total) by mouth 2 (two) times  daily. (Patient taking differently: Take 500 mg by mouth 2 (two) times daily as needed.) 10 tablet 5  ? Vitamin D, Ergocalciferol, (DRISDOL) 1.25 MG (50000 UNIT) CAPS capsule Take 1 capsule (50,000 Units total) by mouth every 7 (seven) days. 4 capsule 0  ? ?No facility-administered medications prior to visit.  ? ? ?No Known Allergies ? ?Review of Systems  ?Constitutional:  Negative for fever.  ?     (+) irregular menstrual cycles   ?HENT:  Negative for congestion, ear pain, hearing loss, sinus pain and sore throat.   ?Eyes:  Negative for blurred vision and pain.  ?Respiratory:  Negative for cough, sputum production, shortness of breath and wheezing.   ?Cardiovascular:  Negative for chest pain and palpitations.  ?Gastrointestinal:  Negative for blood in stool, constipation, diarrhea, nausea and vomiting.  ?Genitourinary:  Negative for dysuria, frequency, hematuria and urgency.  ?Musculoskeletal:  Negative for back pain, falls and myalgias.  ?Neurological:  Negative for dizziness, sensory change, loss of consciousness, weakness and headaches.  ?Endo/Heme/Allergies:  Negative for environmental allergies. Does not bruise/bleed easily.  ?Psychiatric/Behavioral:  Negative for depression and suicidal ideas. The patient is not nervous/anxious and does not have insomnia.   ? ?   ?Objective:  ?  ?Physical Exam ?Constitutional:   ?   General: She is not in acute distress. ?   Appearance: Normal appearance. She is not ill-appearing.  ?HENT:  ?   Head: Normocephalic and atraumatic.  ?   Right Ear: External ear normal.  ?   Left Ear: External ear normal.  ?Eyes:  ?   Extraocular Movements: Extraocular movements intact.  ?   Pupils: Pupils are equal, round, and reactive to light.  ?Cardiovascular:  ?   Rate and Rhythm: Normal rate and regular rhythm.  ?   Pulses: Normal pulses.  ?   Heart sounds: Normal heart sounds. No murmur heard. ?  No gallop.  ?Pulmonary:  ?   Effort: Pulmonary effort is normal. No respiratory distress.  ?    Breath sounds: Normal breath sounds. No wheezing, rhonchi or rales.  ?Abdominal:  ?   General: Bowel sounds are normal. There is no distension.  ?   Palpations: Abdomen is soft. There is no mass.  ?   Tenderness: There is no abdominal tenderness. There is no guarding or rebound.  ?   Hernia: No hernia is present.  ?Musculoskeletal:  ?   Cervical back: Normal range of motion and neck supple.  ?Lymphadenopathy:  ?  Cervical: No cervical adenopathy.  ?Skin: ?   General: Skin is warm and dry.  ?Neurological:  ?   Mental Status: She is alert and oriented to person, place, and time.  ?Psychiatric:     ?   Behavior: Behavior normal.  ? ? ?BP 128/88 (BP Location: Left Arm, Patient Position: Sitting, Cuff Size: Large)   Pulse 85   Temp 97.9 ?F (36.6 ?C) (Oral)   Resp 18   Ht '5\' 2"'  (1.575 m)   Wt 281 lb 6.4 oz (127.6 kg)   SpO2 96%   BMI 51.47 kg/m?  ?Wt Readings from Last 3 Encounters:  ?11/22/21 281 lb 6.4 oz (127.6 kg)  ?07/02/21 279 lb (126.6 kg)  ?06/03/21 276 lb (125.2 kg)  ? ? ?Diabetic Foot Exam - Simple   ?No data filed ?  ? ?Lab Results  ?Component Value Date  ? WBC 5.6 06/05/2021  ? HGB 12.5 06/05/2021  ? HCT 36.7 06/05/2021  ? PLT 348 06/05/2021  ? GLUCOSE 125 (H) 06/05/2021  ? CHOL 209 (H) 06/05/2021  ? TRIG 147 06/05/2021  ? HDL 42 (L) 06/05/2021  ? LDLCALC 140 (H) 06/05/2021  ? ALT 14 06/05/2021  ? AST 12 06/05/2021  ? NA 139 06/05/2021  ? K 3.5 06/05/2021  ? CL 99 06/05/2021  ? CREATININE 0.81 06/05/2021  ? BUN 15 06/05/2021  ? CO2 32 06/05/2021  ? TSH 2.17 06/03/2021  ? HGBA1C 6.0 06/03/2021  ? MICROALBUR 12.5 (H) 06/03/2021  ? ? ?Lab Results  ?Component Value Date  ? TSH 2.17 06/03/2021  ? ?Lab Results  ?Component Value Date  ? WBC 5.6 06/05/2021  ? HGB 12.5 06/05/2021  ? HCT 36.7 06/05/2021  ? MCV 89.3 06/05/2021  ? PLT 348 06/05/2021  ? ?Lab Results  ?Component Value Date  ? NA 139 06/05/2021  ? K 3.5 06/05/2021  ? CO2 32 06/05/2021  ? GLUCOSE 125 (H) 06/05/2021  ? BUN 15 06/05/2021  ? CREATININE  0.81 06/05/2021  ? BILITOT 0.4 06/05/2021  ? ALKPHOS 73 06/03/2021  ? AST 12 06/05/2021  ? ALT 14 06/05/2021  ? PROT 7.0 06/05/2021  ? ALBUMIN 4.0 06/03/2021  ? CALCIUM 9.7 06/05/2021  ? EGFR 93 06/05/2021  ?

## 2021-11-23 LAB — COMPREHENSIVE METABOLIC PANEL
AG Ratio: 1.3 (calc) (ref 1.0–2.5)
ALT: 16 U/L (ref 6–29)
AST: 15 U/L (ref 10–30)
Albumin: 4 g/dL (ref 3.6–5.1)
Alkaline phosphatase (APISO): 77 U/L (ref 31–125)
BUN: 15 mg/dL (ref 7–25)
CO2: 29 mmol/L (ref 20–32)
Calcium: 9.6 mg/dL (ref 8.6–10.2)
Chloride: 101 mmol/L (ref 98–110)
Creat: 0.92 mg/dL (ref 0.50–0.99)
Globulin: 3.2 g/dL (calc) (ref 1.9–3.7)
Glucose, Bld: 101 mg/dL — ABNORMAL HIGH (ref 65–99)
Potassium: 3.8 mmol/L (ref 3.5–5.3)
Sodium: 140 mmol/L (ref 135–146)
Total Bilirubin: 0.3 mg/dL (ref 0.2–1.2)
Total Protein: 7.2 g/dL (ref 6.1–8.1)

## 2021-11-23 LAB — CBC WITH DIFFERENTIAL/PLATELET
Absolute Monocytes: 502 cells/uL (ref 200–950)
Basophils Absolute: 38 cells/uL (ref 0–200)
Basophils Relative: 0.5 %
Eosinophils Absolute: 106 cells/uL (ref 15–500)
Eosinophils Relative: 1.4 %
HCT: 36.8 % (ref 35.0–45.0)
Hemoglobin: 12.4 g/dL (ref 11.7–15.5)
Lymphs Abs: 3215 cells/uL (ref 850–3900)
MCH: 30.5 pg (ref 27.0–33.0)
MCHC: 33.7 g/dL (ref 32.0–36.0)
MCV: 90.6 fL (ref 80.0–100.0)
MPV: 10.7 fL (ref 7.5–12.5)
Monocytes Relative: 6.6 %
Neutro Abs: 3739 cells/uL (ref 1500–7800)
Neutrophils Relative %: 49.2 %
Platelets: 363 10*3/uL (ref 140–400)
RBC: 4.06 10*6/uL (ref 3.80–5.10)
RDW: 12.5 % (ref 11.0–15.0)
Total Lymphocyte: 42.3 %
WBC: 7.6 10*3/uL (ref 3.8–10.8)

## 2021-11-23 LAB — LIPID PANEL
Cholesterol: 231 mg/dL — ABNORMAL HIGH (ref <200)
HDL: 41 mg/dL — ABNORMAL LOW (ref >=50)
LDL Cholesterol (Calc): 159 mg/dL (calc) — ABNORMAL HIGH
Non-HDL Cholesterol (Calc): 190 mg/dL (calc) — ABNORMAL HIGH (ref <130)
Total CHOL/HDL Ratio: 5.6 (calc) — ABNORMAL HIGH (ref <5.0)
Triglycerides: 159 mg/dL — ABNORMAL HIGH (ref <150)

## 2021-11-23 LAB — HEMOGLOBIN A1C
Hgb A1c MFr Bld: 5.6 % of total Hgb (ref <5.7)
Mean Plasma Glucose: 114 mg/dL
eAG (mmol/L): 6.3 mmol/L

## 2021-11-25 LAB — INSULIN, RANDOM: Insulin: 115.3 u[IU]/mL — ABNORMAL HIGH

## 2021-11-28 ENCOUNTER — Other Ambulatory Visit: Payer: Self-pay | Admitting: Family Medicine

## 2021-12-02 ENCOUNTER — Other Ambulatory Visit: Payer: Self-pay

## 2021-12-02 DIAGNOSIS — Z124 Encounter for screening for malignant neoplasm of cervix: Secondary | ICD-10-CM | POA: Diagnosis not present

## 2021-12-02 DIAGNOSIS — E8881 Metabolic syndrome: Secondary | ICD-10-CM

## 2021-12-02 DIAGNOSIS — Z6841 Body Mass Index (BMI) 40.0 and over, adult: Secondary | ICD-10-CM | POA: Diagnosis not present

## 2021-12-02 DIAGNOSIS — Z1151 Encounter for screening for human papillomavirus (HPV): Secondary | ICD-10-CM | POA: Diagnosis not present

## 2021-12-02 DIAGNOSIS — Z01419 Encounter for gynecological examination (general) (routine) without abnormal findings: Secondary | ICD-10-CM | POA: Diagnosis not present

## 2021-12-02 LAB — HM PAP SMEAR: HPV, high-risk: NEGATIVE

## 2021-12-03 ENCOUNTER — Other Ambulatory Visit: Payer: Self-pay | Admitting: Family Medicine

## 2021-12-09 ENCOUNTER — Other Ambulatory Visit: Payer: Self-pay | Admitting: Family Medicine

## 2021-12-09 DIAGNOSIS — I10 Essential (primary) hypertension: Secondary | ICD-10-CM

## 2022-01-01 ENCOUNTER — Encounter: Payer: Self-pay | Admitting: Family Medicine

## 2022-01-01 NOTE — Telephone Encounter (Signed)
Per patient, Autumn Curry is covered by insurance but will likely require a PA

## 2022-01-06 ENCOUNTER — Ambulatory Visit (HOSPITAL_COMMUNITY)
Admission: EM | Admit: 2022-01-06 | Discharge: 2022-01-06 | Disposition: A | Discharge status: Home / Self Care | Payer: BC Managed Care – PPO | Attending: Physician Assistant | Admitting: Physician Assistant

## 2022-01-06 ENCOUNTER — Encounter (HOSPITAL_COMMUNITY): Payer: Self-pay | Admitting: Emergency Medicine

## 2022-01-06 ENCOUNTER — Ambulatory Visit (INDEPENDENT_AMBULATORY_CARE_PROVIDER_SITE_OTHER): Payer: BC Managed Care – PPO

## 2022-01-06 DIAGNOSIS — M79672 Pain in left foot: Secondary | ICD-10-CM

## 2022-01-06 DIAGNOSIS — N926 Irregular menstruation, unspecified: Secondary | ICD-10-CM | POA: Diagnosis not present

## 2022-01-06 DIAGNOSIS — D259 Leiomyoma of uterus, unspecified: Secondary | ICD-10-CM | POA: Diagnosis not present

## 2022-01-06 DIAGNOSIS — M7989 Other specified soft tissue disorders: Secondary | ICD-10-CM | POA: Diagnosis not present

## 2022-01-06 DIAGNOSIS — Z1231 Encounter for screening mammogram for malignant neoplasm of breast: Secondary | ICD-10-CM | POA: Diagnosis not present

## 2022-01-06 MED ORDER — HYDROCODONE-ACETAMINOPHEN 5-325 MG PO TABS: 0.5000 | ORAL_TABLET | Freq: Every evening | ORAL | 0 refills | Status: AC | PRN

## 2022-01-16 ENCOUNTER — Encounter: Payer: Self-pay | Admitting: Podiatry

## 2022-01-16 ENCOUNTER — Ambulatory Visit: Payer: BC Managed Care – PPO | Admitting: Podiatry

## 2022-01-16 DIAGNOSIS — M778 Other enthesopathies, not elsewhere classified: Secondary | ICD-10-CM | POA: Diagnosis not present

## 2022-01-16 MED ORDER — METHYLPREDNISOLONE 4 MG PO TBPK: ORAL_TABLET | ORAL | 0 refills | Status: DC

## 2022-01-16 MED ORDER — MELOXICAM 15 MG PO TABS: 15.0000 mg | ORAL_TABLET | Freq: Every day | ORAL | 0 refills | Status: DC

## 2022-01-16 NOTE — Progress Notes (Signed)
Subjective:  Patient ID: Autumn Curry, female    DOB: August 10, 1980,  MRN: [id number]  Chief Complaint  Patient presents with   Foot Swelling     NP Urgent care follow up - swelling and inflammation on top of L ft    41 y.o. female presents with the above complaint.  Patient presents with complaint left dorsal foot pain.  Patient is a swelling inflammation going on.  Patient states that she had x-rays done on 01/06/2022.  Happened around June 1 while exercising.  She is admitted for 10 days she does not have any history of gout.  Mild and uncle both have gout.  She went to get it evaluated she denies any other acute complaints.  Hurts with ambulation pain scale 7 out of 10   Review of Systems: Negative except as noted in the HPI. Denies N/V/F/Ch.  Past Medical History:  Diagnosis Date   Absence of menstruation    Carbuncle and furuncle of unspecified site    Elevated blood pressure reading without diagnosis of hypertension    Encounter for long-term (current) use of other medications    Headache(784.0)    HIV infection ([place]) 2008   dx AIDS   Hyperlipidemia    Hypertension    Routine general medical examination at a health care facility    Screening for malignant neoplasm of the cervix    Syphilis, unspecified     Current Outpatient Medications:    meloxicam (MOBIC) 15 MG tablet, Take 1 tablet (15 mg total) by mouth daily., Disp: 30 tablet, Rfl: 0   methylPREDNISolone (MEDROL DOSEPAK) 4 MG TBPK tablet, Take as directed, Disp: 21 each, Rfl: 0   aspirin-acetaminophen-caffeine (EXCEDRIN MIGRAINE) 250-250-65 MG tablet, Take by mouth every 6 (six) hours as needed for headache., Disp: , Rfl:    atenolol (TENORMIN) 25 MG tablet, Take 1 tablet by mouth once daily, Disp: 90 tablet, Rfl: 1   hydrochlorothiazide (HYDRODIURIL) 25 MG tablet, TAKE 1 TABLET BY MOUTH ONCE DAILY, Disp: 90 tablet, Rfl: 1   Ibuprofen 200 MG CAPS, Take 200 mg by mouth every 6 (six) hours as needed. , Disp: , Rfl:     loratadine (CLARITIN) 10 MG tablet, Take 10 mg by mouth daily., Disp: , Rfl:    Multiple Vitamins-Minerals (ALIVE WOMENS ENERGY) TABS, Take 1 tablet by mouth daily., Disp: , Rfl:    SYMTUZA 800-150-200-10 MG TABS, TAKE 1 TABLET BY MOUTH 1 TIME A DAY WITH BREAKFAST, Disp: 30 tablet, Rfl: 5   trimethoprim-polymyxin b (POLYTRIM) ophthalmic solution, Place 2 drops into the right eye every 6 (six) hours., Disp: 10 mL, Rfl: 0   valACYclovir (VALTREX) 500 MG tablet, Take 1 tablet (500 mg total) by mouth 2 (two) times daily. (Patient taking differently: Take 500 mg by mouth 2 (two) times daily as needed.), Disp: 10 tablet, Rfl: 5   Vitamin D, Ergocalciferol, (DRISDOL) 1.25 MG (50000 UNIT) CAPS capsule, Take 1 capsule (50,000 Units total) by mouth every 7 (seven) days., Disp: 4 capsule, Rfl: 0  Social History   Tobacco Use  Smoking Status Never  Smokeless Tobacco Never    Allergies  Allergen Reactions   Metformin And Related Diarrhea   Objective:  There were no vitals filed for this visit. There is no height or weight on file to calculate BMI. Constitutional Well developed. Well nourished.  Vascular Dorsalis pedis pulses palpable bilaterally. Posterior tibial pulses palpable bilaterally. Capillary refill normal to all digits.  No cyanosis or clubbing noted. Pedal hair  growth normal.  Neurologic Normal speech. Oriented to person, place, and time. Epicritic sensation to light touch grossly present bilaterally.  Dermatologic Nails well groomed and normal in appearance. No open wounds. No skin lesions.  Orthopedic: Pain on palpation to the left dorsal foot not at the midfoot.  No pain at the digits.  Pain with resisted dorsiflexion of the digit.  Extensor tendinitis noted.  No flexor tendinitis noted.  No pain at the Lisfranc interval   Radiographs: 3 views of skeletally mature adult left foot: No bony abnormalities noted.  Soft tissue noted swelling noted Assessment:   1. Extensor  tendinitis of foot    Plan:  Patient was evaluated and treated and all questions answered.  Left extensor tendinitis -All questions and concerns were discussed with the patient in extensive detail.  Given the amount of pain that she is experiencing she will benefit from immobilization of the foot with a cam boot.  Cam boot was applied.  This will allow the soft tissue pain to be decreased and once the pain has become more focalized we can discuss steroid injection at that time.  She states understanding -Cam boot was dispensed  No follow-ups on file.

## 2022-01-27 ENCOUNTER — Other Ambulatory Visit: Payer: Self-pay | Admitting: Internal Medicine

## 2022-01-27 DIAGNOSIS — B20 Human immunodeficiency virus [HIV] disease: Secondary | ICD-10-CM

## 2022-02-07 ENCOUNTER — Other Ambulatory Visit: Payer: Self-pay | Admitting: Podiatry

## 2022-02-12 ENCOUNTER — Ambulatory Visit: Payer: BC Managed Care – PPO | Admitting: Podiatry

## 2022-02-12 DIAGNOSIS — M778 Other enthesopathies, not elsewhere classified: Secondary | ICD-10-CM

## 2022-02-12 NOTE — Progress Notes (Signed)
Subjective:  Patient ID: Autumn Curry, female    DOB: September 04, 1980,  MRN: [id number]  Chief Complaint  Patient presents with   Routine Post Op    Extensor tendinitis of foot, No pain, numbness or tingling    41 y.o. female presents with the above complaint.  Patient presents for follow-up of left extensor tendinitis.  She states she is doing a lot better the cam boot immobilization helped considerably.  She would like to discuss next treatment plan.  She states that it has improved her pain scale is now 4 out of 10.  She states it has been more localized now.  Denies any other acute, she like to discuss next treatment plan.   Review of Systems: Negative except as noted in the HPI. Denies N/V/F/Ch.  Past Medical History:  Diagnosis Date   Absence of menstruation    Carbuncle and furuncle of unspecified site    Elevated blood pressure reading without diagnosis of hypertension    Encounter for long-term (current) use of other medications    Headache(784.0)    HIV infection ([place]) 2008   dx AIDS   Hyperlipidemia    Hypertension    Routine general medical examination at a health care facility    Screening for malignant neoplasm of the cervix    Syphilis, unspecified     Current Outpatient Medications:    aspirin-acetaminophen-caffeine (EXCEDRIN MIGRAINE) 250-250-65 MG tablet, Take by mouth every 6 (six) hours as needed for headache., Disp: , Rfl:    atenolol (TENORMIN) 25 MG tablet, Take 1 tablet by mouth once daily, Disp: 90 tablet, Rfl: 1   hydrochlorothiazide (HYDRODIURIL) 25 MG tablet, TAKE 1 TABLET BY MOUTH ONCE DAILY, Disp: 90 tablet, Rfl: 1   loratadine (CLARITIN) 10 MG tablet, Take 10 mg by mouth daily., Disp: , Rfl:    meloxicam (MOBIC) 15 MG tablet, Take 1 tablet (15 mg total) by mouth daily., Disp: 30 tablet, Rfl: 0   methylPREDNISolone (MEDROL DOSEPAK) 4 MG TBPK tablet, Take as directed, Disp: 21 each, Rfl: 0   Multiple Vitamins-Minerals (ALIVE WOMENS ENERGY) TABS, Take  1 tablet by mouth daily., Disp: , Rfl:    SYMTUZA 800-150-200-10 MG TABS, TAKE 1 TABLET BY MOUTH 1 TIME A DAY WITH BREAKFAST, Disp: 30 tablet, Rfl: 5   valACYclovir (VALTREX) 500 MG tablet, Take 1 tablet (500 mg total) by mouth 2 (two) times daily. (Patient taking differently: Take 500 mg by mouth 2 (two) times daily as needed.), Disp: 10 tablet, Rfl: 5   Ibuprofen 200 MG CAPS, Take 200 mg by mouth every 6 (six) hours as needed. , Disp: , Rfl:    trimethoprim-polymyxin b (POLYTRIM) ophthalmic solution, Place 2 drops into the right eye every 6 (six) hours., Disp: 10 mL, Rfl: 0   Vitamin D, Ergocalciferol, (DRISDOL) 1.25 MG (50000 UNIT) CAPS capsule, Take 1 capsule (50,000 Units total) by mouth every 7 (seven) days., Disp: 4 capsule, Rfl: 0  Social History   Tobacco Use  Smoking Status Never  Smokeless Tobacco Never    Allergies  Allergen Reactions   Metformin And Related Diarrhea   Objective:  There were no vitals filed for this visit. There is no height or weight on file to calculate BMI. Constitutional Well developed. Well nourished.  Vascular Dorsalis pedis pulses palpable bilaterally. Posterior tibial pulses palpable bilaterally. Capillary refill normal to all digits.  No cyanosis or clubbing noted. Pedal hair growth normal.  Neurologic Normal speech. Oriented to person, place, and time. Epicritic sensation  to light touch grossly present bilaterally.  Dermatologic Nails well groomed and normal in appearance. No open wounds. No skin lesions.  Orthopedic: Pain on palpation to the left dorsal foot not at the midfoot.  No pain at the digits.  Pain with resisted dorsiflexion of the digit.  Extensor tendinitis noted.  No flexor tendinitis noted.  No pain at the Lisfranc interval   Radiographs: 3 views of skeletally mature adult left foot: No bony abnormalities noted.  Soft tissue noted swelling noted Assessment:   1. Extensor tendinitis of foot     Plan:  Patient was evaluated  and treated and all questions answered.  Left extensor tendinitis -All questions and concerns were discussed with the patient in extensive detail.   -At this time she will be can transition from cam boot to Tri-Lock ankle brace. -I will issue also benefit from steroid injection of decrease inflammatory component associate with pain.  Patient agrees with plan like to proceed with steroid injection. -A steroid injection was performed at left dorsal foot at point of maximal tenderness using 1% plain Lidocaine and 10 mg of Kenalog. This was well tolerated.   No follow-ups on file.

## 2022-02-19 ENCOUNTER — Encounter (INDEPENDENT_AMBULATORY_CARE_PROVIDER_SITE_OTHER): Payer: Self-pay

## 2022-02-19 ENCOUNTER — Other Ambulatory Visit: Payer: Self-pay | Admitting: Family Medicine

## 2022-02-19 DIAGNOSIS — I10 Essential (primary) hypertension: Secondary | ICD-10-CM

## 2022-04-07 ENCOUNTER — Other Ambulatory Visit: Payer: Self-pay

## 2022-04-07 ENCOUNTER — Encounter: Payer: Self-pay | Admitting: Internal Medicine

## 2022-04-07 MED ORDER — VALACYCLOVIR HCL 500 MG PO TABS: 500.0000 mg | ORAL_TABLET | Freq: Two times a day (BID) | ORAL | 5 refills | Status: DC

## 2022-04-10 ENCOUNTER — Ambulatory Visit (INDEPENDENT_AMBULATORY_CARE_PROVIDER_SITE_OTHER): Payer: BC Managed Care – PPO | Admitting: Internal Medicine

## 2022-04-10 ENCOUNTER — Encounter (INDEPENDENT_AMBULATORY_CARE_PROVIDER_SITE_OTHER): Payer: Self-pay | Admitting: Internal Medicine

## 2022-04-10 VITALS — BP 124/82 | HR 69 | Temp 98.5°F | Ht 62.0 in | Wt 278.0 lb

## 2022-04-10 DIAGNOSIS — Z6841 Body Mass Index (BMI) 40.0 and over, adult: Secondary | ICD-10-CM

## 2022-04-10 DIAGNOSIS — E8881 Metabolic syndrome: Secondary | ICD-10-CM

## 2022-04-10 DIAGNOSIS — Z0289 Encounter for other administrative examinations: Secondary | ICD-10-CM

## 2022-04-10 DIAGNOSIS — I10 Essential (primary) hypertension: Secondary | ICD-10-CM

## 2022-04-10 NOTE — Progress Notes (Signed)
Office: [phone]  /  Fax: [phone]  Initial Visit  Autumn Curry was seen in clinic today to evaluate for obesity. She is interested in losing weight to improve overall health and reduce the risk of weight related complications.  She is a Electronics engineer at [employer].  She was a former patient but got lost to follow-up for over a year.  She is not following any nutritional plan and physical activity is described as on and off.  She acknowledges gaining weight due to caloric intake, unhealthy food choices and physical inactivity.  She endorses having low energy and some fatigue with daily activity.  She sleeps approximately 6 hours.  Her peak weight is 278 pounds her personal goal is to weigh 200 pounds.  Associated conditions include hypertension, prediabetes, vitamin D deficiency.  She also has dyspnea on exertion.  She presents today to review program treatment options, initial physical assessment, and evaluation.      Past medical history includes:   Past Medical History:  Diagnosis Date   Absence of menstruation    Carbuncle and furuncle of unspecified site    Elevated blood pressure reading without diagnosis of hypertension    Encounter for long-term (current) use of other medications    Headache(784.0)    HIV infection ([place]) 2008   dx AIDS   Hyperlipidemia    Hypertension    Routine general medical examination at a health care facility    Screening for malignant neoplasm of the cervix    Syphilis, unspecified      Objective:   BP 124/82   Pulse 69   Temp 98.5 F (36.9 C)   Ht '5\' 2"'$  (1.575 m)   Wt 278 lb (126.1 kg)   SpO2 97%   BMI 50.85 kg/m  She was weighed on the bioimpedance scale:  Body mass index is 50.85 kg/m.  General:  Alert, oriented and cooperative. Patient is in no acute distress.  Respiratory: Normal respiratory effort, no problems with respiration noted  Extremities: Normal range of motion.    Mental Status: Normal mood and affect. Normal  behavior. Normal judgment and thought content.   Assessment and Plan:  Class 3 severe obesity with serious comorbidity and body mass index (BMI) of 50.0 to 59.9 in adult, unspecified obesity type [facility])  Patient counseled on weight, associated conditions as well as contributing factors.  She will benefit from determination of her basal metabolic rate as well as initiating an appropriate reduced calorie nutritional plan as well as increasing physical activity.  Her peak weight is 278 pounds her personal goal is 200 pounds.   Essential hypertension Blood pressure well controlled on hydrochlorothiazide and atenolol.  Atenolol may affect weight loss.  She will continue current medications we will check renal parameters with her labs.  Insulin resistance Her insulin levels were 115 back in May of this year.  This is related to excess adiposity and should improve with reduced calorie low-carb high-protein nutrition plan.      Obesity Treatment Plan:  She will work on garnering support from family and friends to begin weight loss journey. Work on eliminating or reducing the presence of highly processed, calorie dense foods in the home. Complete provided nutritional and psychosocial assessment questionnaire.    Autumn Curry will follow up in the next 1-2 weeks to review the above steps and continue evaluation and treatment.  Obesity Education Performed Today:  She was weighed on the bioimpedance scale and results were discussed and documented in  the synopsis.  We discussed obesity as a disease and the importance of a more detailed evaluation of all the factors contributing to the disease.  We discussed the importance of long term lifestyle changes which include nutrition, exercise and behavioral modifications as well as the importance of customizing this to her specific health and social needs.  We discussed the benefits of reaching a healthier weight to alleviate the symptoms of existing  conditions and reduce the risks of the biomechanical, metabolic and psychological effects of obesity.  We discussed the goals of this program is to improve her overall health and not simply achieve a specific BMI.  Frequent visits are very important to patient success. I plan to see her every 2 weeks for the first 3 months and then evaluate the visit frequency after that time. I explained obesity is a life-long chronic disease and long term treatments would be required. Medications to help her follow his eating plan may be offered as appropriate but are not required. All medication decisions will be made together after the initial workup is done and benefits and side effects are discussed in depth.  The clinic rules were reviewed including the late policy, cancellation policy, no show and program fees.  Autumn Curry appears to be in the action stage of change and states they are ready to start intensive lifestyle modifications and behavioral modifications.  30 minutes was spent today on this visit including the above counseling, pre-visit chart review, and post-visit documentation.  Thomes Dinning, MD

## 2022-04-24 ENCOUNTER — Encounter (INDEPENDENT_AMBULATORY_CARE_PROVIDER_SITE_OTHER): Payer: Self-pay | Admitting: Internal Medicine

## 2022-04-24 ENCOUNTER — Ambulatory Visit (INDEPENDENT_AMBULATORY_CARE_PROVIDER_SITE_OTHER): Payer: BC Managed Care – PPO | Admitting: Internal Medicine

## 2022-04-24 VITALS — BP 150/90 | HR 85 | Temp 98.1°F | Ht 62.0 in | Wt 276.6 lb

## 2022-04-24 DIAGNOSIS — R0602 Shortness of breath: Secondary | ICD-10-CM | POA: Diagnosis not present

## 2022-04-24 DIAGNOSIS — R7303 Prediabetes: Secondary | ICD-10-CM

## 2022-04-24 DIAGNOSIS — Z1331 Encounter for screening for depression: Secondary | ICD-10-CM | POA: Insufficient documentation

## 2022-04-24 DIAGNOSIS — E559 Vitamin D deficiency, unspecified: Secondary | ICD-10-CM | POA: Diagnosis not present

## 2022-04-24 DIAGNOSIS — Z6841 Body Mass Index (BMI) 40.0 and over, adult: Secondary | ICD-10-CM

## 2022-04-24 DIAGNOSIS — I10 Essential (primary) hypertension: Secondary | ICD-10-CM | POA: Diagnosis not present

## 2022-04-24 DIAGNOSIS — R5383 Other fatigue: Secondary | ICD-10-CM | POA: Diagnosis not present

## 2022-04-25 LAB — LIPID PANEL WITH LDL/HDL RATIO
Cholesterol, Total: 249 mg/dL — ABNORMAL HIGH (ref 100–199)
HDL: 43 mg/dL (ref >39)
LDL Chol Calc (NIH): 182 mg/dL — ABNORMAL HIGH (ref 0–99)
LDL/HDL Ratio: 4.2 ratio — ABNORMAL HIGH (ref 0.0–3.2)
Triglycerides: 129 mg/dL (ref 0–149)
VLDL Cholesterol Cal: 24 mg/dL (ref 5–40)

## 2022-04-25 LAB — INSULIN, RANDOM: INSULIN: 40.5 u[IU]/mL — ABNORMAL HIGH (ref 2.6–24.9)

## 2022-04-30 NOTE — Progress Notes (Signed)
Chief Complaint:   OBESITY Autumn Curry (MR# [id number]) is a 41 y.o. female who presents for evaluation and treatment of obesity and related comorbidities. Current BMI is Body mass index is 50.59 kg/m. Autumn Curry has been struggling with her weight for many years and has been unsuccessful in either losing weight, maintaining weight loss, or reaching her healthy weight goal.  Autumn Curry is currently in the action stage of change and ready to dedicate time achieving and maintaining a healthier weight. Autumn Curry is interested in becoming our patient and working on intensive lifestyle modifications including (but not limited to) diet and exercise for weight loss.  Autumn Curry's habits were reviewed today and are as follows: Her family eats meals together, she thinks her family will eat healthier with her, she struggles with family and or coworkers weight loss sabotage, her desired weight loss is 76 lbs, she has been heavy most of her life, she started gaining weight in her teen years, her heaviest weight ever was 278 pounds, she is a picky eater and doesn't like to eat healthier foods, she has significant food cravings issues, she snacks frequently in the evenings, she is frequently drinking liquids with calories, she frequently makes poor food choices, she has problems with excessive hunger, she frequently eats larger portions than normal, and she struggles with emotional eating.  Depression Screen Autumn Curry's Food and Mood (modified PHQ-9) score was 5.     04/24/2022    8:13 AM  Depression screen PHQ 2/9  Decreased Interest 1  Down, Depressed, Hopeless 0  PHQ - 2 Score 1  Altered sleeping 1  Tired, decreased energy 1  Change in appetite 0  Feeling bad or failure about yourself  1  Trouble concentrating 1  Moving slowly or fidgety/restless 0  Suicidal thoughts 0  PHQ-9 Score 5  Difficult doing work/chores Not difficult at all   Subjective:   1. Other fatigue Autumn Curry admits to daytime  somnolence and admits to waking up still tired. Patient has a history of symptoms of daytime fatigue, morning fatigue, and morning headache. Autumn Curry generally gets 6 or 7 hours of sleep per night, and states that she has generally restful sleep. Snoring is present. Apneic episodes are present. Epworth Sleepiness Score is 1.   2. SOB (shortness of breath) Autumn Curry notes increasing shortness of breath with exercising and seems to be worsening over time with weight gain. She notes getting out of breath sooner with activity than she used to. This has not gotten worse recently. Autumn Curry denies shortness of breath at rest or orthopnea.  Autumn Curry's IC is better than predicted and improved from 2 years ago.  3. Hypertension, essential Autumn Curry is on atenolol, and her repeat blood pressure was 150/90, not at goal.  4. Prediabetes Autumn Curry is A1c in 2022 was 6.0, and May 2023 her A1c was 5.6.  She was counseled on disease state and risk of progression.  I discussed labs with the patient today.  5. Vitamin D deficiency Autumn Curry's diagnosis is associated with adiposity, leptin resistance, adipogenesis, and fatigue.  I discussed labs with the patient today.  Assessment/Plan:   1. Other fatigue Autumn Curry does feel that her weight is causing her energy to be lower than it should be. Fatigue may be related to obesity, depression or many other causes. Labs will be ordered, and in the meanwhile, Autumn Curry will focus on self care including making healthy food choices, increasing physical activity and focusing on stress reduction.  - EKG 12-Lead - CBC  with Differential/Platelet; Future - Comprehensive metabolic panel; Future - Lipid Panel With LDL/HDL Ratio - TSH; Future - Vitamin B12  2. SOB (shortness of breath) Autumn Curry does feel that she gets out of breath more easily that she used to when she exercises. Autumn Curry's shortness of breath appears to be obesity related and exercise induced. She has agreed to work on  weight loss and gradually increase exercise to treat her exercise induced shortness of breath. Will continue to monitor closely.  3. Hypertension, essential Goal blood pressure is <120/80.  Autumn Curry is to contact her PCP for medication adjustment.  She may benefit from switching to weight neutral beta-blocker (carvedilol, Bystolic) to adding calcium channel blocker.  We will forward this note to her PCP.  4. Prediabetes We will check labs today.  Reduced calorie, low carbohydrate, high-protein meal plan was provided.  Autumn Curry will increase her physical activity to 150 minutes a week of moderate intensity.  - Hemoglobin A1c; Future - Insulin, random  5. Vitamin D deficiency We will check labs today, and we will replenish for a goal of 50-60.  - VITAMIN D 25 Hydroxy (Vit-D Deficiency, Fractures); Future  6. Depression screening Autumn Curry had a positive depression screening. Depression is commonly associated with obesity and often results in emotional eating behaviors. We will monitor this closely and work on CBT to help improve the non-hunger eating patterns. Referral to Psychology may be required if no improvement is seen as she continues in our clinic.  7. Class 3 severe obesity without serious comorbidity with body mass index (BMI) of 50.0 to 59.9 in adult, unspecified obesity type (HCC) Autumn Curry is currently in the action stage of change and her goal is to continue with weight loss efforts. I recommend Autumn Curry begin the structured treatment plan as follows:  She has agreed to the Category 3 Plan.  Exercise goals: As is.  Further recommendations at future office visit.  Behavioral modification strategies: increasing lean protein intake, decreasing simple carbohydrates, no skipping meals, meal planning and cooking strategies, keeping healthy foods in the home, better snacking choices, and planning for success.  She was informed of the importance of frequent follow-up visits to maximize her  success with intensive lifestyle modifications for her multiple health conditions. She was informed we would discuss her lab results at her next visit unless there is a critical issue that needs to be addressed sooner. Autumn Curry agreed to keep her next visit at the agreed upon time to discuss these results.  Objective:   Blood pressure (!) 150/90, pulse 85, temperature 98.1 F (36.7 C), height '5\' 2"'$  (1.575 m), weight 276 lb 9.6 oz (125.5 kg), SpO2 95 %. Body mass index is 50.59 kg/m.  EKG: Normal sinus rhythm, rate 71 BPM.  Indirect Calorimeter completed today shows a VO2 of 292 and a REE of 2016.  Her calculated basal metabolic rate is 9417 thus her basal metabolic rate is better than expected.  General: Cooperative, alert, well developed, in no acute distress. HEENT: Conjunctivae and lids unremarkable. Cardiovascular: Regular rhythm.  Lungs: Normal work of breathing. Neurologic: No focal deficits.   Lab Results  Component Value Date   CREATININE 0.92 11/22/2021   BUN 15 11/22/2021   NA 140 11/22/2021   K 3.8 11/22/2021   CL 101 11/22/2021   CO2 29 11/22/2021   Lab Results  Component Value Date   ALT 16 11/22/2021   AST 15 11/22/2021   ALKPHOS 73 06/03/2021   BILITOT 0.3 11/22/2021   Lab Results  Component Value Date   HGBA1C 5.6 11/22/2021   HGBA1C 6.0 06/03/2021   HGBA1C 5.9 (H) 02/19/2021   HGBA1C 5.7 (H) 03/15/2020   HGBA1C 5.6 10/10/2019   Lab Results  Component Value Date   INSULIN 40.5 (H) 04/24/2022   INSULIN 49.4 (H) 02/19/2021   INSULIN 51.3 (H) 03/15/2020   INSULIN 30.3 (H) 10/10/2019   INSULIN 60.9 (H) 02/22/2019   Lab Results  Component Value Date   TSH 2.17 06/03/2021   Lab Results  Component Value Date   CHOL 249 (H) 04/24/2022   HDL 43 04/24/2022   LDLCALC 182 (H) 04/24/2022   TRIG 129 04/24/2022   CHOLHDL 5.6 (H) 11/22/2021   Lab Results  Component Value Date   WBC 7.6 11/22/2021   HGB 12.4 11/22/2021   HCT 36.8 11/22/2021   MCV 90.6  11/22/2021   PLT 363 11/22/2021   No results found for: "IRON", "TIBC", "FERRITIN"  Attestation Statements:   Reviewed by clinician on day of visit: allergies, medications, problem list, medical history, surgical history, family history, social history, and previous encounter notes.  Time spent on visit including pre-visit chart review and post-visit charting and care was 40 minutes.   Wilhemena Durie, am acting as transcriptionist for Thomes Dinning, MD.  I have reviewed the above documentation for accuracy and completeness, and I agree with the above. -Thomes Dinning, MD

## 2022-05-08 ENCOUNTER — Encounter (INDEPENDENT_AMBULATORY_CARE_PROVIDER_SITE_OTHER): Payer: Self-pay | Admitting: Internal Medicine

## 2022-05-08 ENCOUNTER — Ambulatory Visit (INDEPENDENT_AMBULATORY_CARE_PROVIDER_SITE_OTHER): Payer: BC Managed Care – PPO | Admitting: Internal Medicine

## 2022-05-08 VITALS — BP 136/95 | HR 89 | Temp 98.8°F | Ht 62.0 in | Wt 275.8 lb

## 2022-05-08 DIAGNOSIS — E7800 Pure hypercholesterolemia, unspecified: Secondary | ICD-10-CM | POA: Diagnosis not present

## 2022-05-08 DIAGNOSIS — E161 Other hypoglycemia: Secondary | ICD-10-CM | POA: Diagnosis not present

## 2022-05-08 DIAGNOSIS — I10 Essential (primary) hypertension: Secondary | ICD-10-CM | POA: Diagnosis not present

## 2022-05-08 DIAGNOSIS — Z6841 Body Mass Index (BMI) 40.0 and over, adult: Secondary | ICD-10-CM

## 2022-05-08 DIAGNOSIS — E559 Vitamin D deficiency, unspecified: Secondary | ICD-10-CM | POA: Diagnosis not present

## 2022-05-09 LAB — CMP14+EGFR
ALT: 18 IU/L (ref 0–32)
AST: 20 IU/L (ref 0–40)
Albumin/Globulin Ratio: 1.3 (ref 1.2–2.2)
Albumin: 4.2 g/dL (ref 3.9–4.9)
Alkaline Phosphatase: 81 IU/L (ref 44–121)
BUN/Creatinine Ratio: 13 (ref 9–23)
BUN: 16 mg/dL (ref 6–24)
Bilirubin Total: 0.3 mg/dL (ref 0.0–1.2)
CO2: 28 mmol/L (ref 20–29)
Calcium: 9.8 mg/dL (ref 8.7–10.2)
Chloride: 97 mmol/L (ref 96–106)
Creatinine, Ser: 1.21 mg/dL — ABNORMAL HIGH (ref 0.57–1.00)
Globulin, Total: 3.2 g/dL (ref 1.5–4.5)
Glucose: 85 mg/dL (ref 70–99)
Potassium: 3.9 mmol/L (ref 3.5–5.2)
Sodium: 140 mmol/L (ref 134–144)
Total Protein: 7.4 g/dL (ref 6.0–8.5)
eGFR: 57 mL/min/{1.73_m2} — ABNORMAL LOW (ref >59)

## 2022-05-09 LAB — CBC WITH DIFFERENTIAL
Basophils Absolute: 0.1 10*3/uL (ref 0.0–0.2)
Basos: 1 %
EOS (ABSOLUTE): 0.2 10*3/uL (ref 0.0–0.4)
Eos: 2 %
Hematocrit: 37.1 % (ref 34.0–46.6)
Hemoglobin: 13 g/dL (ref 11.1–15.9)
Immature Grans (Abs): 0 10*3/uL (ref 0.0–0.1)
Immature Granulocytes: 0 %
Lymphocytes Absolute: 2.9 10*3/uL (ref 0.7–3.1)
Lymphs: 38 %
MCH: 30.9 pg (ref 26.6–33.0)
MCHC: 35 g/dL (ref 31.5–35.7)
MCV: 88 fL (ref 79–97)
Monocytes Absolute: 0.4 10*3/uL (ref 0.1–0.9)
Monocytes: 5 %
Neutrophils Absolute: 4.1 10*3/uL (ref 1.4–7.0)
Neutrophils: 54 %
RBC: 4.21 x10E6/uL (ref 3.77–5.28)
RDW: 12.5 % (ref 11.7–15.4)
WBC: 7.7 10*3/uL (ref 3.4–10.8)

## 2022-05-09 LAB — HEMOGLOBIN A1C
Est. average glucose Bld gHb Est-mCnc: 117 mg/dL
Hgb A1c MFr Bld: 5.7 % — ABNORMAL HIGH (ref 4.8–5.6)

## 2022-05-09 LAB — TSH: TSH: 1.85 u[IU]/mL (ref 0.450–4.500)

## 2022-05-09 LAB — VITAMIN D 25 HYDROXY (VIT D DEFICIENCY, FRACTURES): Vit D, 25-Hydroxy: 31.2 ng/mL (ref 30.0–100.0)

## 2022-05-12 DIAGNOSIS — E161 Other hypoglycemia: Secondary | ICD-10-CM | POA: Insufficient documentation

## 2022-05-12 DIAGNOSIS — E7800 Pure hypercholesterolemia, unspecified: Secondary | ICD-10-CM | POA: Insufficient documentation

## 2022-05-21 NOTE — Progress Notes (Signed)
Chief Complaint:   OBESITY Autumn Curry is here to discuss her progress with her obesity treatment plan along with follow-up of her obesity related diagnoses. Autumn Curry is on the Category 3 Plan and states she is following her eating plan approximately 80% of the time. Autumn Curry states she is walking for 15 minutes 7 times per week.  Today's visit was #: 2 Starting weight: 276 lbs Starting date: 04/24/2022 Today's weight: 275 lbs Today's date: 05/08/2022 Total lbs lost to date: 1 Total lbs lost since last in-office visit: 1  Interim History: Autumn Curry is having difficulties with breakfast due to time.  She is meeting recommendations at lunch and dinner.  She feels better, more energy, and decreased cravings and snacking.  bioimpedance scale shows decreased fat mass, increased muscle mass.  Her blood work was not completed at her last office visit.  Subjective:   1. Hyperinsulinemia Autumn Curry's last insulin level was 40.5, and we are awaiting A1c.  This may contribute to cravings and weight gain.  I discussed labs with the patient today.  2. Hypercholesteremia Autumn Curry's last LDL was 182.  She acknowledges diet indiscretion when she had labs.  I discussed labs with the patient today.  3. Hypertension, essential Autumn Curry's blood pressure is not at goal.  Her goal is 120/80.  We are awaiting renal parameters.  She is on beta-blocker and hydrochlorothiazide.  4. Vitamin D deficiency Autumn Curry is not on vitamin D supplementation, and her goal is 50-60.  Assessment/Plan:   1. Hyperinsulinemia We will check labs today.  Autumn Curry's levels should improve on her current nutrition plan and weight loss therapy.  We will consider metformin if cravings worsen. - Hemoglobin A1c  2. Hypercholesteremia We should see an improvement with the new nutrition plan.  We will recheck FLP in 3 months.  If no improvement, we will consider statin as she also has hypertension.  3. Hypertension, essential We will  check labs today. I highly encouraged the patient to purchase a blood pressure machine to monitor her blood pressures at home.  Autumn Curry was also advised on decreasing her sodium in her diet.  - CBC With Differential - CMP14+EGFR - TSH  4. Vitamin D deficiency We will check labs today, and we will follow-up at Autumn Curry's next office visit.  - VITAMIN D 25 Hydroxy (Vit-D Deficiency, Fractures)  5. Class 3 severe obesity without serious comorbidity with body mass index (BMI) of 50.0 to 59.9 in adult, unspecified obesity type (HCC) Autumn Curry is currently in the action stage of change. As such, her goal is to continue with weight loss efforts. She has agreed to the Category 3 Plan.   Provided breakfast strategies.   Exercise goals: As is.   Behavioral modification strategies: increasing lean protein intake, no skipping meals, meal planning and cooking strategies, keeping healthy foods in the home, avoiding temptations, and planning for success.  Autumn Curry has agreed to follow-up with our clinic in 2 weeks. She was informed of the importance of frequent follow-up visits to maximize her success with intensive lifestyle modifications for her multiple health conditions.   Autumn Curry was informed we would discuss her lab results at her next visit unless there is a critical issue that needs to be addressed sooner. Autumn Curry agreed to keep her next visit at the agreed upon time to discuss these results.  Objective:   Blood pressure (!) 136/95, pulse 89, temperature 98.8 F (37.1 C), height _0  (1.575 m), weight 275 lb 12.8 oz (125.1 kg), SpO2 97 %.  Body mass index is 50.44 kg/m.  General: Cooperative, alert, well developed, in no acute distress. HEENT: Conjunctivae and lids unremarkable. Cardiovascular: Regular rhythm.  Lungs: Normal work of breathing. Neurologic: No focal deficits.   Lab Results  Component Value Date   CREATININE 1.21 (H) 05/08/2022   BUN 16 05/08/2022   NA 140 05/08/2022   K  3.9 05/08/2022   CL 97 05/08/2022   CO2 28 05/08/2022   Lab Results  Component Value Date   ALT 18 05/08/2022   AST 20 05/08/2022   ALKPHOS 81 05/08/2022   BILITOT 0.3 05/08/2022   Lab Results  Component Value Date   HGBA1C 5.7 (H) 05/08/2022   HGBA1C 5.6 11/22/2021   HGBA1C 6.0 06/03/2021   HGBA1C 5.9 (H) 02/19/2021   HGBA1C 5.7 (H) 03/15/2020   Lab Results  Component Value Date   INSULIN 40.5 (H) 04/24/2022   INSULIN 49.4 (H) 02/19/2021   INSULIN 51.3 (H) 03/15/2020   INSULIN 30.3 (H) 10/10/2019   INSULIN 60.9 (H) 02/22/2019   Lab Results  Component Value Date   TSH 1.850 05/08/2022   Lab Results  Component Value Date   CHOL 249 (H) 04/24/2022   HDL 43 04/24/2022   LDLCALC 182 (H) 04/24/2022   TRIG 129 04/24/2022   CHOLHDL 5.6 (H) 11/22/2021   Lab Results  Component Value Date   VD25OH 31.2 05/08/2022   VD25OH 28.02 (L) 06/03/2021   VD25OH 29.0 (L) 02/19/2021   Lab Results  Component Value Date   WBC 7.7 05/08/2022   HGB 13.0 05/08/2022   HCT 37.1 05/08/2022   MCV 88 05/08/2022   PLT 363 11/22/2021   No results found for: "IRON", "TIBC", "FERRITIN"  Attestation Statements:   Reviewed by clinician on day of visit: allergies, medications, problem list, medical history, surgical history, family history, social history, and previous encounter notes.  Time spent on visit including pre-visit chart review and post-visit care and charting was 40 minutes.   Wilhemena Durie, am acting as transcriptionist for Thomes Dinning, MD.  I have reviewed the above documentation for accuracy and completeness, and I agree with the above. -Thomes Dinning, MD

## 2022-05-26 ENCOUNTER — Other Ambulatory Visit: Payer: Self-pay | Admitting: Family Medicine

## 2022-05-26 DIAGNOSIS — I10 Essential (primary) hypertension: Secondary | ICD-10-CM

## 2022-05-28 ENCOUNTER — Encounter (INDEPENDENT_AMBULATORY_CARE_PROVIDER_SITE_OTHER): Payer: Self-pay | Admitting: Internal Medicine

## 2022-05-28 ENCOUNTER — Ambulatory Visit (INDEPENDENT_AMBULATORY_CARE_PROVIDER_SITE_OTHER): Payer: BC Managed Care – PPO | Admitting: Internal Medicine

## 2022-05-28 VITALS — BP 134/87 | HR 85 | Temp 98.1°F | Ht 62.0 in | Wt 277.0 lb

## 2022-05-28 DIAGNOSIS — R944 Abnormal results of kidney function studies: Secondary | ICD-10-CM | POA: Diagnosis not present

## 2022-05-28 DIAGNOSIS — R7303 Prediabetes: Secondary | ICD-10-CM | POA: Diagnosis not present

## 2022-05-28 DIAGNOSIS — Z6841 Body Mass Index (BMI) 40.0 and over, adult: Secondary | ICD-10-CM

## 2022-05-28 DIAGNOSIS — E7849 Other hyperlipidemia: Secondary | ICD-10-CM | POA: Diagnosis not present

## 2022-05-28 DIAGNOSIS — E669 Obesity, unspecified: Secondary | ICD-10-CM | POA: Diagnosis not present

## 2022-05-29 DIAGNOSIS — R7303 Prediabetes: Secondary | ICD-10-CM | POA: Insufficient documentation

## 2022-05-29 DIAGNOSIS — R944 Abnormal results of kidney function studies: Secondary | ICD-10-CM | POA: Insufficient documentation

## 2022-06-03 ENCOUNTER — Encounter: Payer: BC Managed Care – PPO | Admitting: Family Medicine

## 2022-06-12 ENCOUNTER — Encounter: Payer: BC Managed Care – PPO | Admitting: Family Medicine

## 2022-06-12 ENCOUNTER — Telehealth: Payer: Self-pay

## 2022-06-12 NOTE — Telephone Encounter (Signed)
Caller Name [place] Phone Number [phone] Patient Name Autumn Curry Patient DOB 1980-12-08 Call Type Message Only Information Provided Reason for Call Request to Reschedule Office Appointment Initial Comment Caller states she has an appt today today at 240pm. She would like to reschedule this appt. Patient request to speak to RN No Additional Comment Office hours provided. Triage declined. Disp. Time Disposition Final User 06/12/2022 7:32:41 AM General Information Provided Yes Orrin Brigham Call Closed By: Orrin Brigham Transaction Date/Time: 06/12/2022 7:30:00 AM (ET)

## 2022-06-14 NOTE — Progress Notes (Unsigned)
Chief Complaint:   OBESITY Autumn Curry is here to discuss her progress with her obesity treatment plan along with follow-up of her obesity related diagnoses. Autumn Curry is on the Category 3 Plan and states she is following her eating plan approximately 50% of the time. Autumn Curry states she is not currently exercising.  Today's visit was #: 3 Starting weight: 276 lbs Starting date: 04/24/2022 Today's weight: 277 lbs Today's date: 05/28/2022 Total lbs lost to date: 0 Total lbs lost since last in-office visit: +2  Interim History: Autumn Curry acknowledges having difficulties with meal prep and adhering to plan due to travel and church activities. She also has been cravings sweets. Pt denies problems with satiety. She has been eating out more the last 2 weeks.  Subjective:   1. Decreased GFR Discussed labs with patient today. GFR 57, likely due to HCTZ, and inadequate fluid intake.  2. Other hyperlipidemia Discussed labs with patient today. Total cholesterol 249, LDL 182 and elevated. HAART may be contributing to levels.  3. Pre-diabetes Discussed labs with patient today. A1c 5.7 and was 6.0 in the past. Pt counseled on risk of progression. She had side effects of Metformin. Pt would benefit from GLP-1 but coverage is changing.  Assessment/Plan:   1. Decreased GFR Increase water intake and avoid nephrotoxins. Check BMP, U/A next visit.  HIV specialist or primary care if we see a sustained decrease in GFR as she is on tenofovir as part of her HIV regimen. Comment 2. Other hyperlipidemia Counseled on risk for ASCVD related to hypertension and elevated LDL. She declined statin therapy. Work on reducing fats in diet, but would likely still benefit from statin therapy.  3. Pre-diabetes Continue weight loss therapy. Consider GLP-1 in January.  4. Obesity,current BMI 50.8 Autumn Curry is currently in the action stage of change. As such, her goal is to continue with weight loss efforts. She has  agreed to the Category 3 Plan.   Continue committed. Autumn Curry will be working on meal plan and incorporating nutrition plan gradually. Cravings for sweets is a challenge.  Exercise goals:  As is  Behavioral modification strategies: increasing lean protein intake, decreasing simple carbohydrates, increasing water intake, decreasing eating out, no skipping meals, meal planning and cooking strategies, keeping healthy foods in the home, better snacking choices, emotional eating strategies, avoiding temptations, and planning for success.  Autumn Curry has agreed to follow-up with our clinic in 2-3 weeks. She was informed of the importance of frequent follow-up visits to maximize her success with intensive lifestyle modifications for her multiple health conditions.   Objective:   Blood pressure 134/87, pulse 85, temperature 98.1 F (36.7 C), height '5\' 2"'$  (1.575 m), weight 277 lb (125.6 kg), SpO2 99 %. Body mass index is 50.66 kg/m.  General: Cooperative, alert, well developed, in no acute distress. HEENT: Conjunctivae and lids unremarkable. Cardiovascular: Regular rhythm.  Lungs: Normal work of breathing. Neurologic: No focal deficits.   Lab Results  Component Value Date   CREATININE 1.21 (H) 05/08/2022   BUN 16 05/08/2022   NA 140 05/08/2022   K 3.9 05/08/2022   CL 97 05/08/2022   CO2 28 05/08/2022   Lab Results  Component Value Date   ALT 18 05/08/2022   AST 20 05/08/2022   ALKPHOS 81 05/08/2022   BILITOT 0.3 05/08/2022   Lab Results  Component Value Date   HGBA1C 5.7 (H) 05/08/2022   HGBA1C 5.6 11/22/2021   HGBA1C 6.0 06/03/2021   HGBA1C 5.9 (H) 02/19/2021   HGBA1C  5.7 (H) 03/15/2020   Lab Results  Component Value Date   INSULIN 40.5 (H) 04/24/2022   INSULIN 49.4 (H) 02/19/2021   INSULIN 51.3 (H) 03/15/2020   INSULIN 30.3 (H) 10/10/2019   INSULIN 60.9 (H) 02/22/2019   Lab Results  Component Value Date   TSH 1.850 05/08/2022   Lab Results  Component Value Date    CHOL 249 (H) 04/24/2022   HDL 43 04/24/2022   LDLCALC 182 (H) 04/24/2022   TRIG 129 04/24/2022   CHOLHDL 5.6 (H) 11/22/2021   Lab Results  Component Value Date   VD25OH 31.2 05/08/2022   VD25OH 28.02 (L) 06/03/2021   VD25OH 29.0 (L) 02/19/2021   Lab Results  Component Value Date   WBC 7.7 05/08/2022   HGB 13.0 05/08/2022   HCT 37.1 05/08/2022   MCV 88 05/08/2022   PLT 363 11/22/2021   Attestation Statements:   Reviewed by clinician on day of visit: allergies, medications, problem list, medical history, surgical history, family history, social history, and previous encounter notes.  Time spent on visit including pre-visit chart review and post-visit care and charting was 20 minutes.   I, Kathlene November, BS, CMA, am acting as transcriptionist for Thomes Dinning, MD.  I have reviewed the above documentation for accuracy and completeness, and I agree with the above. -Thomes Dinning, MD

## 2022-06-18 ENCOUNTER — Ambulatory Visit (INDEPENDENT_AMBULATORY_CARE_PROVIDER_SITE_OTHER): Payer: BC Managed Care – PPO | Admitting: Internal Medicine

## 2022-06-20 ENCOUNTER — Other Ambulatory Visit: Payer: Self-pay

## 2022-06-20 DIAGNOSIS — Z79899 Other long term (current) drug therapy: Secondary | ICD-10-CM

## 2022-06-20 DIAGNOSIS — Z113 Encounter for screening for infections with a predominantly sexual mode of transmission: Secondary | ICD-10-CM

## 2022-06-20 DIAGNOSIS — B20 Human immunodeficiency virus [HIV] disease: Secondary | ICD-10-CM

## 2022-06-24 ENCOUNTER — Other Ambulatory Visit: Payer: BC Managed Care – PPO

## 2022-06-24 ENCOUNTER — Other Ambulatory Visit: Payer: Self-pay

## 2022-06-24 DIAGNOSIS — Z79899 Other long term (current) drug therapy: Secondary | ICD-10-CM

## 2022-06-24 DIAGNOSIS — Z113 Encounter for screening for infections with a predominantly sexual mode of transmission: Secondary | ICD-10-CM

## 2022-06-24 DIAGNOSIS — B20 Human immunodeficiency virus [HIV] disease: Secondary | ICD-10-CM

## 2022-06-25 ENCOUNTER — Ambulatory Visit (INDEPENDENT_AMBULATORY_CARE_PROVIDER_SITE_OTHER): Payer: BC Managed Care – PPO | Admitting: Physician Assistant

## 2022-06-25 LAB — T-HELPER CELL (CD4) - (RCID CLINIC ONLY)
CD4 % Helper T Cell: 40 % (ref 33–65)
CD4 T Cell Abs: 1070 /uL (ref 400–1790)

## 2022-06-26 LAB — COMPLETE METABOLIC PANEL WITH GFR
AG Ratio: 1.1 (calc) (ref 1.0–2.5)
ALT: 15 U/L (ref 6–29)
AST: 17 U/L (ref 10–30)
Albumin: 3.9 g/dL (ref 3.6–5.1)
Alkaline phosphatase (APISO): 78 U/L (ref 31–125)
BUN: 18 mg/dL (ref 7–25)
CO2: 30 mmol/L (ref 20–32)
Calcium: 9.7 mg/dL (ref 8.6–10.2)
Chloride: 102 mmol/L (ref 98–110)
Creat: 0.86 mg/dL (ref 0.50–0.99)
Globulin: 3.4 g/dL (calc) (ref 1.9–3.7)
Glucose, Bld: 84 mg/dL (ref 65–99)
Potassium: 3.6 mmol/L (ref 3.5–5.3)
Sodium: 141 mmol/L (ref 135–146)
Total Bilirubin: 0.4 mg/dL (ref 0.2–1.2)
Total Protein: 7.3 g/dL (ref 6.1–8.1)
eGFR: 86 mL/min/{1.73_m2} (ref >=60)

## 2022-06-26 LAB — CBC WITH DIFFERENTIAL/PLATELET
Absolute Monocytes: 418 cells/uL (ref 200–950)
Basophils Absolute: 58 cells/uL (ref 0–200)
Basophils Relative: 0.8 %
Eosinophils Absolute: 158 cells/uL (ref 15–500)
Eosinophils Relative: 2.2 %
HCT: 35.8 % (ref 35.0–45.0)
Hemoglobin: 12.3 g/dL (ref 11.7–15.5)
Lymphs Abs: 3110 cells/uL (ref 850–3900)
MCH: 30.6 pg (ref 27.0–33.0)
MCHC: 34.4 g/dL (ref 32.0–36.0)
MCV: 89.1 fL (ref 80.0–100.0)
MPV: 10.6 fL (ref 7.5–12.5)
Monocytes Relative: 5.8 %
Neutro Abs: 3456 cells/uL (ref 1500–7800)
Neutrophils Relative %: 48 %
Platelets: 377 10*3/uL (ref 140–400)
RBC: 4.02 10*6/uL (ref 3.80–5.10)
RDW: 12.2 % (ref 11.0–15.0)
Total Lymphocyte: 43.2 %
WBC: 7.2 10*3/uL (ref 3.8–10.8)

## 2022-06-26 LAB — LIPID PANEL
Cholesterol: 225 mg/dL — ABNORMAL HIGH (ref <200)
HDL: 42 mg/dL — ABNORMAL LOW (ref >=50)
LDL Cholesterol (Calc): 147 mg/dL (calc) — ABNORMAL HIGH
Non-HDL Cholesterol (Calc): 183 mg/dL (calc) — ABNORMAL HIGH (ref <130)
Total CHOL/HDL Ratio: 5.4 (calc) — ABNORMAL HIGH (ref <5.0)
Triglycerides: 222 mg/dL — ABNORMAL HIGH (ref <150)

## 2022-06-26 LAB — HIV-1 RNA QUANT-NO REFLEX-BLD
HIV 1 RNA Quant: NOT DETECTED Copies/mL
HIV-1 RNA Quant, Log: NOT DETECTED Log cps/mL

## 2022-06-26 LAB — RPR: RPR Ser Ql: NONREACTIVE

## 2022-07-02 ENCOUNTER — Ambulatory Visit (INDEPENDENT_AMBULATORY_CARE_PROVIDER_SITE_OTHER): Payer: BC Managed Care – PPO | Admitting: Physician Assistant

## 2022-07-08 ENCOUNTER — Encounter: Payer: BC Managed Care – PPO | Admitting: Internal Medicine

## 2022-07-09 ENCOUNTER — Ambulatory Visit (INDEPENDENT_AMBULATORY_CARE_PROVIDER_SITE_OTHER): Payer: BC Managed Care – PPO | Admitting: Physician Assistant

## 2022-07-09 ENCOUNTER — Encounter (INDEPENDENT_AMBULATORY_CARE_PROVIDER_SITE_OTHER): Payer: Self-pay | Admitting: Physician Assistant

## 2022-07-09 VITALS — BP 172/103 | HR 82 | Temp 98.4°F | Ht 62.0 in | Wt 279.0 lb

## 2022-07-09 DIAGNOSIS — Z6841 Body Mass Index (BMI) 40.0 and over, adult: Secondary | ICD-10-CM

## 2022-07-09 DIAGNOSIS — I10 Essential (primary) hypertension: Secondary | ICD-10-CM | POA: Diagnosis not present

## 2022-07-09 DIAGNOSIS — E669 Obesity, unspecified: Secondary | ICD-10-CM | POA: Diagnosis not present

## 2022-07-09 DIAGNOSIS — R7303 Prediabetes: Secondary | ICD-10-CM | POA: Diagnosis not present

## 2022-07-15 ENCOUNTER — Other Ambulatory Visit: Payer: Self-pay

## 2022-07-15 ENCOUNTER — Encounter: Payer: Self-pay | Admitting: Internal Medicine

## 2022-07-15 ENCOUNTER — Ambulatory Visit: Payer: 59 | Admitting: Internal Medicine

## 2022-07-15 VITALS — BP 141/86 | HR 78 | Temp 98.2°F | Wt 285.8 lb

## 2022-07-15 DIAGNOSIS — B20 Human immunodeficiency virus [HIV] disease: Secondary | ICD-10-CM | POA: Diagnosis not present

## 2022-07-15 DIAGNOSIS — Z23 Encounter for immunization: Secondary | ICD-10-CM | POA: Diagnosis not present

## 2022-07-15 MED ORDER — SYMTUZA 800-150-200-10 MG PO TABS: ORAL_TABLET | ORAL | 11 refills | Status: DC

## 2022-07-15 NOTE — Progress Notes (Signed)
Patient Active Problem List   Diagnosis Date Noted   Human immunodeficiency virus (HIV) disease ([place]) 09/03/2006    Priority: High   Decreased GFR 05/29/2022   Pre-diabetes 05/29/2022   Hypercholesteremia 05/12/2022   Hyperinsulinemia 05/12/2022   Hypertension, essential 04/24/2022   SOB (shortness of breath) 04/24/2022   Other fatigue 04/24/2022   Depression screening 04/24/2022   Class 3 severe obesity without serious comorbidity with body mass index (BMI) of 50.0 to 59.9 in adult [facility]) 04/24/2022   Anxiety 07/02/2021   At risk for impaired metabolic function 74/94/4967   Prediabetes 09/15/2019   Vitamin D deficiency 09/15/2019   Class 3 severe obesity with serious comorbidity and body mass index (BMI) of 50.0 to 59.9 in adult ([place]) 12/23/2018   Insulin resistance 12/23/2018   Hyperlipidemia 01/26/2018   Shortness of breath on exertion 12/14/2017   Essential hypertension 12/14/2017   Preventative health care 08/18/2016   Genital herpes 02/16/2012   AMENORRHEA 08/31/2007   BOILS, RECURRENT 08/31/2007   HEADACHE 08/31/2007   SYPHILIS NOS 09/11/2006    Patient's Medications  New Prescriptions   No medications on file  Previous Medications   ASPIRIN-ACETAMINOPHEN-CAFFEINE (EXCEDRIN MIGRAINE) 250-250-65 MG TABLET    Take by mouth every 6 (six) hours as needed for headache.   ATENOLOL (TENORMIN) 25 MG TABLET    Take 1 tablet (25 mg total) by mouth daily.   HYDROCHLOROTHIAZIDE (HYDRODIURIL) 25 MG TABLET    Take 1 tablet (25 mg total) by mouth daily.   LORATADINE (CLARITIN) 10 MG TABLET    Take 10 mg by mouth daily.   MULTIPLE VITAMINS-MINERALS (ALIVE WOMENS ENERGY) TABS    Take 1 tablet by mouth daily.   VALACYCLOVIR (VALTREX) 500 MG TABLET    Take 1 tablet (500 mg total) by mouth 2 (two) times daily.  Modified Medications   Modified Medication Previous Medication   DARUNAVIR-COBICISTAT-EMTRICITABINE-TENOFOVIR ALAFENAMIDE (SYMTUZA) 800-150-200-10 MG TABS SYMTUZA  800-150-200-10 MG TABS      TAKE 1 TABLET BY MOUTH 1 TIME A DAY WITH BREAKFAST    TAKE 1 TABLET BY MOUTH 1 TIME A DAY WITH BREAKFAST  Discontinued Medications   No medications on file    Subjective: Autumn Curry is in for her routine HIV follow-up visit.  She has not had any problems obtaining, taking or tolerating her Symtuza and has not missed any doses that she recalls.  She is not on any new medications since her last visit.  She has not had an annual influenza vaccine or an updated COVID-vaccine.  She is not feeling anxious or depressed.  Review of Systems: Review of Systems  Constitutional:  Negative for fever and weight loss.  Psychiatric/Behavioral:  Negative for depression. The patient is not nervous/anxious.     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   Hyperlipidemia    Hypertension    Migraine    Prediabetes    Routine general medical examination at a health care facility    Screening for malignant neoplasm of the cervix    Syphilis, unspecified     Social History   Tobacco Use   Smoking status: Never   Smokeless tobacco: Never  Vaping Use   Vaping Use: Never used  Substance Use Topics   Alcohol use: Yes    Alcohol/week: 0.0 standard drinks of alcohol    Comment: rarely   Drug use: No    Family History  Problem Relation Age of Onset   Hyperlipidemia Mother    Hypertension Mother    Depression Mother    Coronary artery disease Father    Diabetes Father    Heart disease Father    Hyperlipidemia Father    Stroke Father    Kidney disease Father    Colon cancer Neg Hx    Esophageal cancer Neg Hx    Colon polyps Neg Hx    Pancreatic cancer Neg Hx    Stomach cancer Neg Hx     Allergies   Allergen Reactions   Metformin And Related Diarrhea    Health Maintenance  Topic Date Due   COVID-19 Vaccine (3 - Pfizer risk series) 02/27/2020   PAP SMEAR-Modifier  12/02/2024   DTaP/Tdap/Td (2 - Td or Tdap) 07/11/2026   INFLUENZA VACCINE  Completed   Hepatitis C Screening  Completed   HIV Screening  Completed   HPV VACCINES  Aged Out    Objective:  Vitals:   07/15/22 1454  BP: (!) 141/86  Pulse: 78  Temp: 98.2 F (36.8 C)  TempSrc: Oral  SpO2: 95%  Weight: 285 lb 12.8 oz (129.6 kg)   Body mass index is 52.27 kg/m.  Physical Exam Constitutional:      Comments: She is in good spirits as usual.  She has gained about 38 pounds in the past 4 years.  Cardiovascular:     Rate and Rhythm: Normal rate.  Pulmonary:     Effort: Pulmonary effort is normal.  Psychiatric:        Mood and Affect: Mood normal.     Lab Results Lab Results  Component Value Date   WBC 7.2 06/24/2022   HGB 12.3 06/24/2022   HCT 35.8 06/24/2022   MCV 89.1 06/24/2022   PLT 377 06/24/2022    Lab Results  Component Value Date   CREATININE 0.86 06/24/2022   BUN 18 06/24/2022   NA 141 06/24/2022   K 3.6 06/24/2022   CL 102 06/24/2022   CO2 30 06/24/2022    Lab Results  Component Value Date   ALT 15 06/24/2022   AST 17 06/24/2022   ALKPHOS 81 05/08/2022   BILITOT 0.4 06/24/2022    Lab Results  Component Value Date   CHOL 225 (H) 06/24/2022   HDL 42 (L) 06/24/2022   LDLCALC 147 (H) 06/24/2022   TRIG 222 (H) 06/24/2022   CHOLHDL 5.4 (H) 06/24/2022   Lab Results  Component Value Date   LABRPR NON-REACTIVE 06/24/2022   HIV 1 RNA Quant (Copies/mL)  Date Value  06/24/2022 Not Detected  06/05/2021 Not Detected  05/07/2020 <20 (H)   CD4 T Cell Abs (/uL)  Date Value  06/24/2022 1,070  05/07/2020 1,053  04/26/2019 1,111     Problem List Items Addressed This Visit       High   Human immunodeficiency virus (HIV) disease ([place]) - Primary    Her HIV infection remains under  excellent, long-term control.  She received her annual influenza vaccine here today but was not willing to take an updated COVID-vaccine.  She will continue Symtuza and follow-up after lab work in 1 year.      Relevant Medications   Darunavir-Cobicistat-Emtricitabine-Tenofovir Alafenamide (SYMTUZA) 800-150-200-10 MG TABS  Other Relevant Orders   CBC   T-helper cells (CD4) count (not at [facility])   Comprehensive metabolic panel   Lipid panel   RPR   HIV-1 RNA quant-no reflex-bld   Other Visit Diagnoses     Need for immunization against influenza       Relevant Orders   Flu Vaccine QUAD 38moIM (Fluarix, Fluzone & Alfiuria Quad PF) (Completed)         JMichel Bickers MD RPris[facility] Health Tuomey Hospitalfor [id number] [phone]   [phone] 07/15/2022, 5:03 PM

## 2022-07-15 NOTE — Assessment & Plan Note (Signed)
Her HIV infection remains under excellent, long-term control.  She received her annual influenza vaccine here today but was not willing to take an updated COVID-vaccine.  She will continue Symtuza and follow-up after lab work in 1 year.

## 2022-07-23 NOTE — Progress Notes (Signed)
Chief Complaint:   OBESITY [name] is here to discuss her progress with her obesity treatment plan along with follow-up of her obesity related diagnoses. [name] is on the Category 3 Plan and states she is following her eating plan approximately 40% of the time. Cashay states she is exercising 0 minutes 0 times per week.  Today's visit was #: 4 Starting weight: 276 lbs Starting date: 04/24/2022 Today's weight: 279 lbs Today's date: 07/09/2022 Total lbs lost to date: 0 lbs Total lbs lost since last in-office visit: 0  Interim History: Heera has maintained weight over the holidays.  Struggling with getting protein needs met.  Breakfast is Egg McMuffin/Diet Coke.  Lunch is chicken salad/Pasta salad.  Dinner is grab and go at [employer] or [employer] or baked with sweet potato, salad with ranch if preparing a meal at home.   Her house is undergoing renovation and her kitchen not usable right now and this has been limiting her meal preparation/cooking.  Subjective:   1. Pre-diabetes Shakila's A1c was 5.7/insulin was 40.5 on 10/23.  Unable to tolerate metformin due to GI upset and diarrhea.  Her insurance will change in January and she thinks it may be possible to get GLP-1 medication in January 2024.  2. Hypertension, essential [name] is taking and Atenolol 25 mg daily and HCTZ 25 mg daily.  Denies side effects with medications.  Blood pressure initially increased but 116/78 on recheck today.  No signs of hypotension.  Assessment/Plan:   1. Pre-diabetes Continue prescribed nutrition plan to decrease simple carbs, increase lean protein and exercise to promote weight loss.  Consider GLP-1 in January 2024.  2. Hypertension, essential Monitor blood pressure closely, continue prescribed nutritional plan and exercise to promote weight loss.  Monitor for any signs or symptoms of hypotension.  3. Obesity,current BMI 51.1 Autumn Curry is currently in the action stage of change. As  such, her goal is to continue with weight loss efforts. She has agreed to the Category 3 Plan and keeping a food journal and adhering to recommended goals of 1500 calories and 100+grams of protein daily.   Handout given: Calorie/protein needs.  Exercise goals: As is.  Behavioral modification strategies: increasing lean protein intake, decreasing simple carbohydrates, increasing water intake, and planning for success.  [name] has agreed to follow-up with our clinic in 3 weeks. She was informed of the importance of frequent follow-up visits to maximize her success with intensive lifestyle modifications for her multiple health conditions.   Objective:   Blood pressure (!) 172/103, pulse 82, temperature 98.4 F (36.9 C), height 5\' 2"  (1.575 m), weight 279 lb (126.6 kg), SpO2 95 %. Body mass index is 51.03 kg/m.  General: Cooperative, alert, well developed, in no acute distress. HEENT: Conjunctivae and lids unremarkable. Cardiovascular: Regular rhythm.  Lungs: Normal work of breathing. Neurologic: No focal deficits.   Lab Results  Component Value Date   CREATININE 0.86 06/24/2022   BUN 18 06/24/2022   NA 141 06/24/2022   K 3.6 06/24/2022   CL 102 06/24/2022   CO2 30 06/24/2022   Lab Results  Component Value Date   ALT 15 06/24/2022   AST 17 06/24/2022   ALKPHOS 81 05/08/2022   BILITOT 0.4 06/24/2022   Lab Results  Component Value Date   HGBA1C 5.7 (H) 05/08/2022   HGBA1C 5.6 11/22/2021   HGBA1C 6.0 06/03/2021   HGBA1C 5.9 (H) 02/19/2021   HGBA1C 5.7 (H) 03/15/2020   Lab Results  Component Value Date  INSULIN 40.5 (H) 04/24/2022   INSULIN 49.4 (H) 02/19/2021   INSULIN 51.3 (H) 03/15/2020   INSULIN 30.3 (H) 10/10/2019   INSULIN 60.9 (H) 02/22/2019   Lab Results  Component Value Date   TSH 1.850 05/08/2022   Lab Results  Component Value Date   CHOL 225 (H) 06/24/2022   HDL 42 (L) 06/24/2022   LDLCALC 147 (H) 06/24/2022   TRIG 222 (H) 06/24/2022   CHOLHDL 5.4  (H) 06/24/2022   Lab Results  Component Value Date   VD25OH 31.2 05/08/2022   VD25OH 28.02 (L) 06/03/2021   VD25OH 29.0 (L) 02/19/2021   Lab Results  Component Value Date   WBC 7.2 06/24/2022   HGB 12.3 06/24/2022   HCT 35.8 06/24/2022   MCV 89.1 06/24/2022   PLT 377 06/24/2022   No results found for: "IRON", "TIBC", "FERRITIN"  Attestation Statements:   Reviewed by clinician on day of visit: allergies, medications, problem list, medical history, surgical history, family history, social history, and previous encounter notes.  I, Brendell Tyus, am acting as transcriptionist for [employer], PA.  I have reviewed the above documentation for accuracy and completeness, and I agree with the above. -  Seline Enzor,PA-C

## 2022-07-24 ENCOUNTER — Ambulatory Visit (INDEPENDENT_AMBULATORY_CARE_PROVIDER_SITE_OTHER): Payer: 59 | Admitting: Family Medicine

## 2022-07-24 ENCOUNTER — Encounter: Payer: Self-pay | Admitting: Family Medicine

## 2022-07-24 VITALS — BP 126/72 | HR 78 | Temp 98.4°F | Ht 62.0 in | Wt 284.4 lb

## 2022-07-24 DIAGNOSIS — Z0000 Encounter for general adult medical examination without abnormal findings: Secondary | ICD-10-CM | POA: Diagnosis not present

## 2022-07-24 DIAGNOSIS — B20 Human immunodeficiency virus [HIV] disease: Secondary | ICD-10-CM

## 2022-07-24 DIAGNOSIS — R7303 Prediabetes: Secondary | ICD-10-CM | POA: Diagnosis not present

## 2022-07-24 DIAGNOSIS — L0292 Furuncle, unspecified: Secondary | ICD-10-CM

## 2022-07-24 DIAGNOSIS — G43009 Migraine without aura, not intractable, without status migrainosus: Secondary | ICD-10-CM | POA: Diagnosis not present

## 2022-07-24 DIAGNOSIS — Z6841 Body Mass Index (BMI) 40.0 and over, adult: Secondary | ICD-10-CM | POA: Diagnosis not present

## 2022-07-24 DIAGNOSIS — I10 Essential (primary) hypertension: Secondary | ICD-10-CM

## 2022-07-24 DIAGNOSIS — E7849 Other hyperlipidemia: Secondary | ICD-10-CM

## 2022-07-24 MED ORDER — WEGOVY 0.25 MG/0.5ML ~~LOC~~ SOAJ: 0.2500 mg | SUBCUTANEOUS | 0 refills | Status: DC

## 2022-07-24 MED ORDER — ELETRIPTAN HYDROBROMIDE 20 MG PO TABS: 20.0000 mg | ORAL_TABLET | ORAL | 1 refills | Status: AC | PRN

## 2022-07-24 MED ORDER — DOXYCYCLINE HYCLATE 100 MG PO TABS: 100.0000 mg | ORAL_TABLET | Freq: Two times a day (BID) | ORAL | 0 refills | Status: DC

## 2022-07-24 NOTE — Progress Notes (Signed)
Subjective:   By signing my name below, I, Autumn Curry, attest that this documentation has been prepared under the direction and in the presence of Ann Held, DO. 07/24/2022   Patient ID: Autumn Curry, female    DOB: [birth year], 42 y.o.   MRN: [id number]  Chief Complaint  Patient presents with   Annual Exam    HPI Patient is in today for a comprehensive physical exam.  She continues to follow up with her infectious disease specialist but will be transitioning to a new provider. She continues to follow up with her gynecologist. She is having more frequent and stronger migraines that occur twice a week. Excedrin has improved symptoms in the past but did not have an effect for he most recent episode. She has not been drinking adequately recently. She is interested in starting on a weight loss medication but will need insurance approval first. She is experiencing dry skin on her face and small bumps. She also reports having bumps on her legs.She reports no change in skin/laundry care.  No changes in family medical history. No changes in surgical history. She has had 2 Covid-19 vaccinations but no boosters. She had a negative reaction to them that was more severe after the second shot. She is not UTD on vision care  She is UTD on dental care. She reports being UTD on mammogram.  Colonoscopy: Last completed 12/06/2019. Results showed one less than 1 mm polyp in the rectum, normal mucosa in the entire examined colon, erythematous mucosa in the terminal ileum, diverticulosis in the sigmoid colon, descending colon, transverse colon and ascending colon, and non-bleeding internal hemorrhoids. Repeat at age 26. Pap Smear: Last completed on 12/02/2021. Repeat in 3 years.    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   Hyperlipidemia    Hypertension    Migraine    Prediabetes    Routine general medical examination at a health care facility    Screening for malignant neoplasm of the cervix    Syphilis, unspecified     Past Surgical History:  Procedure Laterality Date   NO PAST SURGERIES      Family History  Problem Relation Age of Onset   Hyperlipidemia Mother    Hypertension Mother    Depression Mother    Coronary artery disease Father    Diabetes Father    Heart disease Father    Hyperlipidemia Father    Stroke Father    Kidney disease Father    Colon cancer Neg Hx    Esophageal cancer Neg Hx    Colon polyps Neg Hx    Pancreatic cancer Neg Hx    Stomach cancer Neg Hx     Social History   Socioeconomic History   Marital status: Married    Spouse name: Careers information officer   Number of children: 1   Years of education: Not on file   Highest education level: Not on file  Occupational History   Occupation: Pharmacist, hospital    Comment: head start   Tobacco Use   Smoking status: Never   Smokeless tobacco: Never  Vaping Use   Vaping Use: Never used  Substance and Sexual Activity   Alcohol  use: Yes    Alcohol/week: 0.0 standard drinks of alcohol    Comment: rarely   Drug use: No   Sexual activity: Yes    Partners: Male    Birth control/protection: Condom    Comment: declined condoms  Other Topics Concern   Not on file  Social History Narrative   Not on file   Social Determinants of Health   Financial Resource Strain: Not on file  Food Insecurity: Not on file  Transportation Needs: Not on file  Physical Activity: Insufficiently Active (05/26/2019)   Exercise Vital Sign    Days of Exercise per Week: 3 days    Minutes of Exercise per Session: 30 min  Stress: Not on file  Social Connections: Not on file  Intimate Partner Violence: Not on file    Outpatient Medications Prior to  Visit  Medication Sig Dispense Refill   aspirin-acetaminophen-caffeine (EXCEDRIN MIGRAINE) 250-250-65 MG tablet Take by mouth every 6 (six) hours as needed for headache.     atenolol (TENORMIN) 25 MG tablet Take 1 tablet (25 mg total) by mouth daily. 90 tablet 0   hydrochlorothiazide (HYDRODIURIL) 25 MG tablet Take 1 tablet (25 mg total) by mouth daily. 90 tablet 1   loratadine (CLARITIN) 10 MG tablet Take 10 mg by mouth daily.     Multiple Vitamins-Minerals (ALIVE WOMENS ENERGY) TABS Take 1 tablet by mouth daily.     valACYclovir (VALTREX) 500 MG tablet Take 1 tablet (500 mg total) by mouth 2 (two) times daily. 10 tablet 5   Darunavir-Cobicistat-Emtricitabine-Tenofovir Alafenamide (SYMTUZA) 800-150-200-10 MG TABS TAKE 1 TABLET BY MOUTH 1 TIME A DAY WITH BREAKFAST 30 tablet 11   No facility-administered medications prior to visit.    Allergies  Allergen Reactions   Metformin And Related Diarrhea    Review of Systems  Constitutional:  Negative for fever.  HENT:  Negative for congestion, sinus pain and sore throat.   Respiratory:  Negative for cough, shortness of breath and wheezing.   Cardiovascular:  Negative for chest pain and palpitations.  Gastrointestinal:  Negative for abdominal pain, diarrhea, nausea and vomiting.  Genitourinary:  Negative for dysuria, frequency and hematuria.  Musculoskeletal:  Negative for back pain.       (-)new muscle pain (-)new joint pain  Skin:        (-)new moles (+)boil on right side bra line  Neurological:  Negative for headaches.       Objective:    Physical Exam Constitutional:      Appearance: Normal appearance.  HENT:     Head: Normocephalic and atraumatic.     Right Ear: Tympanic membrane, ear canal and external ear normal.     Left Ear: Tympanic membrane, ear canal and external ear normal.  Eyes:     Extraocular Movements: Extraocular movements intact.     Pupils: Pupils are equal, round, and reactive to light.  Cardiovascular:      Rate and Rhythm: Normal rate and regular rhythm.     Heart sounds: Normal heart sounds. No murmur heard.    No gallop.  Pulmonary:     Effort: Pulmonary effort is normal. No respiratory distress.     Breath sounds: Normal breath sounds. No wheezing or rales.  Abdominal:     General: There is no distension.     Palpations: Abdomen is soft.     Tenderness: There is no abdominal tenderness. There is no guarding.  Skin:    General: Skin is warm and dry.  Comments: Boil under right bra line  Neurological:     Mental Status: She is alert and oriented to person, place, and time.  Psychiatric:        Judgment: Judgment normal.     BP 126/72 (BP Location: Left Arm, Patient Position: Sitting, Cuff Size: Large)   Pulse 78   Temp 98.4 F (36.9 C) (Oral)   Ht '5\' 2"'$  (1.575 m)   Wt 284 lb 6.4 oz (129 kg)   LMP 06/08/2022   SpO2 98%   BMI 52.02 kg/m  Wt Readings from Last 3 Encounters:  07/24/22 284 lb 6.4 oz (129 kg)  07/15/22 285 lb 12.8 oz (129.6 kg)  07/09/22 279 lb (126.6 kg)       Assessment & Plan:  Preventative health care Assessment & Plan: Ghm utd Check labs see  avs  Orders: -     CBC with Differential/Platelet -     Comprehensive metabolic panel -     Lipid panel -     TSH -     Hemoglobin A1c -     Insulin, random -     VITAMIN D 25 Hydroxy (Vit-D Deficiency, Fractures) -     Vitamin B12  Migraine without aura and without status migrainosus, not intractable -     Eletriptan Hydrobromide; Take 1 tablet (20 mg total) by mouth as needed for migraine or headache. One tablet by mouth at onset of headache. May repeat in 2 hours if headache persists or recurs.  Dispense: 10 tablet; Refill: 1  Pre-diabetes -     [id number]; Inject 0.25 mg into the skin once a week.  Dispense: 2 mL; Refill: 0 -     CBC with Differential/Platelet -     Comprehensive metabolic panel -     Lipid panel -     TSH -     Hemoglobin A1c -     Insulin, random -     VITAMIN D 25 Hydroxy  (Vit-D Deficiency, Fractures) -     Vitamin B12  Class 3 severe obesity with serious comorbidity and body mass index (BMI) of 50.0 to 59.9 in adult, unspecified obesity type ([place]) -     HRCBUL; Inject 0.25 mg into the skin once a week.  Dispense: 2 mL; Refill: 0 -     CBC with Differential/Platelet -     Comprehensive metabolic panel -     Lipid panel -     TSH -     Hemoglobin A1c -     Insulin, random -     VITAMIN D 25 Hydroxy (Vit-D Deficiency, Fractures) -     Vitamin B12  Hypertension, essential Assessment & Plan: Well controlled, no changes to meds. Encouraged heart healthy diet such as the DASH diet and exercise as tolerated.     Other hyperlipidemia Assessment & Plan: Encourage heart healthy diet such as MIND or DASH diet, increase exercise, avoid trans fats, simple carbohydrates and processed foods, consider a krill or fish or flaxseed oil cap daily.     Class 3 severe obesity without serious comorbidity with body mass index (BMI) of 50.0 to 59.9 in adult, unspecified obesity type (HCC) Assessment & Plan: Pt diet and exercise    Human immunodeficiency virus (HIV) disease ([place]) Assessment & Plan: Per ID   Boil -     Doxycycline Hyclate; Take 1 tablet (100 mg total) by mouth 2 (two) times daily.  Dispense: 20 tablet; Refill: 0  Essential hypertension Assessment &  Plan: Well controlled, no changes to meds. Encouraged heart healthy diet such as the DASH diet and exercise as tolerated.       IAnn Held, DO, personally preformed the services described in this documentation.  All medical record entries made by the scribe were at my direction and in my presence.  I have reviewed the chart and discharge instructions (if applicable) and agree that the record reflects my personal performance and is accurate and complete. 07/24/2022   I,Autumn Curry,acting as a scribe for Ann Held, DO.,have documented all relevant documentation on the behalf of  Ann Held, DO,as directed by  Ann Held, DO while in the presence of Ann Held, DO.   Ann Held, DO

## 2022-07-24 NOTE — Patient Instructions (Signed)

## 2022-07-25 ENCOUNTER — Other Ambulatory Visit: Payer: Self-pay | Admitting: Internal Medicine

## 2022-07-25 DIAGNOSIS — B20 Human immunodeficiency virus [HIV] disease: Secondary | ICD-10-CM

## 2022-07-25 LAB — CBC WITH DIFFERENTIAL/PLATELET
Basophils Absolute: 0.1 10*3/uL (ref 0.0–0.1)
Basophils Relative: 1.1 % (ref 0.0–3.0)
Eosinophils Absolute: 0.1 10*3/uL (ref 0.0–0.7)
Eosinophils Relative: 1.7 % (ref 0.0–5.0)
HCT: 37 % (ref 36.0–46.0)
Hemoglobin: 12.3 g/dL (ref 12.0–15.0)
Lymphocytes Relative: 42.7 % (ref 12.0–46.0)
Lymphs Abs: 3 10*3/uL (ref 0.7–4.0)
MCHC: 33.1 g/dL (ref 30.0–36.0)
MCV: 92 fl (ref 78.0–100.0)
Monocytes Absolute: 0.5 10*3/uL (ref 0.1–1.0)
Monocytes Relative: 6.5 % (ref 3.0–12.0)
Neutro Abs: 3.4 10*3/uL (ref 1.4–7.7)
Neutrophils Relative %: 48 % (ref 43.0–77.0)
Platelets: 346 10*3/uL (ref 150.0–400.0)
RBC: 4.03 Mil/uL (ref 3.87–5.11)
RDW: 13.3 % (ref 11.5–15.5)
WBC: 7.1 10*3/uL (ref 4.0–10.5)

## 2022-07-25 LAB — HEMOGLOBIN A1C: Hgb A1c MFr Bld: 6.1 % (ref 4.6–6.5)

## 2022-07-25 LAB — INSULIN, RANDOM: Insulin: 45.7 u[IU]/mL — ABNORMAL HIGH

## 2022-07-25 LAB — COMPREHENSIVE METABOLIC PANEL
ALT: 16 U/L (ref 0–35)
AST: 14 U/L (ref 0–37)
Albumin: 4 g/dL (ref 3.5–5.2)
Alkaline Phosphatase: 75 U/L (ref 39–117)
BUN: 17 mg/dL (ref 6–23)
CO2: 32 mEq/L (ref 19–32)
Calcium: 9.5 mg/dL (ref 8.4–10.5)
Chloride: 99 mEq/L (ref 96–112)
Creatinine, Ser: 0.87 mg/dL (ref 0.40–1.20)
GFR: 82.23 mL/min (ref >60.00)
Glucose, Bld: 88 mg/dL (ref 70–99)
Potassium: 3.9 mEq/L (ref 3.5–5.1)
Sodium: 139 mEq/L (ref 135–145)
Total Bilirubin: 0.3 mg/dL (ref 0.2–1.2)
Total Protein: 7.2 g/dL (ref 6.0–8.3)

## 2022-07-25 LAB — LIPID PANEL
Cholesterol: 224 mg/dL — ABNORMAL HIGH (ref 0–200)
HDL: 38.2 mg/dL — ABNORMAL LOW (ref >39.00)
LDL Cholesterol: 147 mg/dL — ABNORMAL HIGH (ref 0–99)
NonHDL: 186.23
Total CHOL/HDL Ratio: 6
Triglycerides: 198 mg/dL — ABNORMAL HIGH (ref 0.0–149.0)
VLDL: 39.6 mg/dL (ref 0.0–40.0)

## 2022-07-25 LAB — VITAMIN B12: Vitamin B-12: 540 pg/mL (ref 211–911)

## 2022-07-25 LAB — TSH: TSH: 2.18 u[IU]/mL (ref 0.35–5.50)

## 2022-07-25 LAB — VITAMIN D 25 HYDROXY (VIT D DEFICIENCY, FRACTURES): VITD: 21.76 ng/mL — ABNORMAL LOW (ref 30.00–100.00)

## 2022-07-27 NOTE — Assessment & Plan Note (Signed)
Per ID 

## 2022-07-27 NOTE — Assessment & Plan Note (Signed)
Well controlled, no changes to meds. Encouraged heart healthy diet such as the DASH diet and exercise as tolerated.  °

## 2022-07-27 NOTE — Assessment & Plan Note (Signed)
Encourage heart healthy diet such as MIND or DASH diet, increase exercise, avoid trans fats, simple carbohydrates and processed foods, consider a krill or fish or flaxseed oil cap daily.  °

## 2022-07-27 NOTE — Assessment & Plan Note (Signed)
Ghm utd Check labs see  avs

## 2022-07-27 NOTE — Assessment & Plan Note (Signed)
Pt diet and exercise

## 2022-07-31 ENCOUNTER — Ambulatory Visit (INDEPENDENT_AMBULATORY_CARE_PROVIDER_SITE_OTHER): Payer: BC Managed Care – PPO | Admitting: Internal Medicine

## 2022-08-11 ENCOUNTER — Ambulatory Visit (INDEPENDENT_AMBULATORY_CARE_PROVIDER_SITE_OTHER): Payer: 59 | Admitting: Internal Medicine

## 2022-08-11 ENCOUNTER — Encounter (INDEPENDENT_AMBULATORY_CARE_PROVIDER_SITE_OTHER): Payer: Self-pay | Admitting: Internal Medicine

## 2022-08-11 VITALS — BP 139/79 | HR 86 | Temp 97.9°F | Ht 62.0 in | Wt 281.0 lb

## 2022-08-11 DIAGNOSIS — R944 Abnormal results of kidney function studies: Secondary | ICD-10-CM

## 2022-08-11 DIAGNOSIS — I10 Essential (primary) hypertension: Secondary | ICD-10-CM | POA: Diagnosis not present

## 2022-08-11 DIAGNOSIS — R7303 Prediabetes: Secondary | ICD-10-CM

## 2022-08-11 DIAGNOSIS — Z6841 Body Mass Index (BMI) 40.0 and over, adult: Secondary | ICD-10-CM

## 2022-08-11 NOTE — Progress Notes (Unsigned)
Office: [phone]  /  Fax: [place] [facility] Height: '5\' 2"'$  (1.575 m) Weight: 281 lb (127.5 kg) Temp: 97.9 F (36.6 C) Pulse Rate: 86 BP: 139/79 SpO2: 96 % Fasting: No Today's Visit #: 5 Weight at Last VIsit: 279 lb Weight Lost Since Last Visit: 0  Body Fat %: 56.1 % Fat Mass (lbs): 157.6 lbs Muscle Mass (lbs): 117.2 lbs Visceral Fat Rating : 20 Starting Date: 04/24/22 Starting Weight: 276 lb    HPI  Chief Complaint: OBESITY  Autumn Curry is here to discuss her progress with her obesity treatment plan. She is on the the Category 3 Plan and states she is following her eating plan approximately 80 % of the time. She states she is exercising 0 minutes 0 times per week.   Interval History: Since last office she reports eating out less.  She has also eliminated sodas.  In the last 2 weeks she has not had takeout but acknowledged going to [place] to stay this Sunday.  There she had the [employer] bourbon chicken with mashed potato as well as a salad bar.  Her 24-hour recall also shows she had a grilled cheese sandwich with regular bread in the morning for dinner she had some rotisserie chicken and leftover salad from the buffet.  She enjoys cooking cabbage with onions also broccoli with cheese she has been making tacos with lean [place] meatloaf with regular beef.  She has not been journaling.  She was recently prescribed Wegovy by primary care physician but she is not sure if this will be covered.  Pharmacotherapy: HYWVPX prescribed by PCP pending approval  PHYSICAL EXAM:  Blood pressure 139/79, pulse 86, temperature 97.9 F (36.6 C), height '5\' 2"'$  (1.575 m), weight 281 lb (127.5 kg), last menstrual period 06/08/2022, SpO2 96 %. Body mass index is 51.4 kg/m.  General: She is overweight, cooperative, alert, well developed, and in no acute distress. PSYCH: Has normal mood, affect and thought process.   HEENT: EOMI, sclerae are  anicteric. Lungs: Normal breathing effort, no conversational dyspnea. Extremities: No edema.  Neurologic: No gross sensory or motor deficits. No tremors or fasciculations noted.    DIAGNOSTIC DATA REVIEWED:  BMET    Component Value Date/Time   NA 139 07/24/2022 1537   NA 140 05/08/2022 1553   K 3.9 07/24/2022 1537   CL 99 07/24/2022 1537   CO2 32 07/24/2022 1537   GLUCOSE 88 07/24/2022 1537   BUN 17 07/24/2022 1537   BUN 16 05/08/2022 1553   CREATININE 0.87 07/24/2022 1537   CREATININE 0.86 06/24/2022 1514   CALCIUM 9.5 07/24/2022 1537   GFRNONAA 99 03/15/2020 0758   GFRNONAA 78 04/26/2019 1429   GFRAA 114 03/15/2020 0758   GFRAA 91 04/26/2019 1429   Lab Results  Component Value Date   HGBA1C 6.1 07/24/2022   HGBA1C 5.6 05/25/2017   Lab Results  Component Value Date   INSULIN 40.5 (H) 04/24/2022   INSULIN 31.4 (H) 12/14/2017   CBC    Component Value Date/Time   WBC 7.1 07/24/2022 1537   RBC 4.03 07/24/2022 1537   HGB 12.3 07/24/2022 1537   HGB 13.0 05/08/2022 1553   HCT 37.0 07/24/2022 1537   HCT 37.1 05/08/2022 1553   PLT 346.0 07/24/2022 1537   MCV 92.0 07/24/2022 1537   MCV 88 05/08/2022 1553   MCH 30.6 06/24/2022 1514   MCHC 33.1 07/24/2022 1537   RDW 13.3 07/24/2022 1537   RDW 12.5  05/08/2022 1553   Iron/TIBC/Ferritin/ %Sat No results found for: "IRON", "TIBC", "FERRITIN", "IRONPCTSAT" Lipid Panel     Component Value Date/Time   CHOL 224 (H) 07/24/2022 1537   CHOL 249 (H) 04/24/2022 0941   TRIG 198.0 (H) 07/24/2022 1537   HDL 38.20 (L) 07/24/2022 1537   HDL 43 04/24/2022 0941   CHOLHDL 6 07/24/2022 1537   VLDL 39.6 07/24/2022 1537   LDLCALC 147 (H) 07/24/2022 1537   LDLCALC 147 (H) 06/24/2022 1514   Hepatic Function Panel     Component Value Date/Time   PROT 7.2 07/24/2022 1537   PROT 7.4 05/08/2022 1553   ALBUMIN 4.0 07/24/2022 1537   ALBUMIN 4.2 05/08/2022 1553   AST 14 07/24/2022 1537   ALT 16 07/24/2022 1537   ALKPHOS 75 07/24/2022  1537   BILITOT 0.3 07/24/2022 1537   BILITOT 0.3 05/08/2022 1553      Component Value Date/Time   TSH 2.18 07/24/2022 1537     ASSESSMENT AND PLAN  TREATMENT PLAN FOR OBESITY:  Recommended Dietary Goals  Autumn Curry is currently in the action stage of change. As such, her goal is to continue weight management plan. She has agreed to keeping a food journal and adhering to recommended goals of 1500 calories and 110 to 130 g of protein.  Behavioral Intervention  We discussed the following Behavioral Modification Strategies today: increasing lean protein intake, decreasing simple carbohydrates, increasing high fiber foods, no skipping meals, meal planning and cooking strategies, keeping healthy foods in the home, planning for success, and keeping a strict food journal.  Additional resources provided today: NA  Recommended Physical Activity Goals  Autumn Curry has been advised to work up to 150 minutes of moderate intensity aerobic activity a week and strengthening exercises 2-3 times per week for cardiovascular health, weight loss maintenance and preservation of muscle mass.   She has agreed to increase physical activity in their day and reduce sedentary time (increase NEAT).    Pharmacotherapy We discussed various medication options to help Autumn Curry with her weight loss efforts and we both agreed to initiate GLP-1 therapy as prescribed by PCP.  ASSOCIATED CONDITIONS ADDRESSED TODAY  Pre-diabetes  Hypertension, essential  Decreased GFR  Class 3 severe obesity with serious comorbidity and body mass index (BMI) of 50.0 to 59.9 in adult, unspecified obesity type (HCC)      No follow-ups on file.[name] She was informed of the importance of frequent follow up visits to maximize her success with intensive lifestyle modifications for her multiple health conditions.   ATTESTASTION STATEMENTS:  Reviewed by clinician on day of visit: allergies, medications, problem list, medical history,  surgical history, family history, social history, and previous encounter notes.   Time spent on visit including pre-visit chart review and post-visit care and charting was *** minutes.    Thomes Dinning, MD

## 2022-08-12 NOTE — Assessment & Plan Note (Signed)
Most recent A1c is  Lab Results  Component Value Date   HGBA1C 6.1 07/24/2022   with associated elevated insulin levels.  Patient informed of disease state and risk of progression. This may contribute to abnormal cravings, fatigue and diabetes complications without having diabetes.   We reviewed treatment options which include weight loss of about 7 to 10% of body weight, increasing physical activity to 150 minutes a week of moderate intensity she has been started on incretin therapy by PCP which will help.

## 2022-08-12 NOTE — Assessment & Plan Note (Signed)
Blood pressure not at goal for age and risk category.  On atenolol 25 mg, hydrochlorothiazide without adverse effects.  Atenolol may cause weight gain.  Most recent renal parameters reviewed which showed normal electrolytes and kidney function.  Continue with weight loss therapy.  Monitor for symptoms of orthostasis while losing weight. Continue current regimen and home monitoring for a goal blood pressure of 120/80.  She will contact her PCP if blood pressure trends at home are above target for intensification of therapy.

## 2022-08-12 NOTE — Assessment & Plan Note (Signed)
Most recent GFR is back to normal.  She needs to work on blood pressure control.  Counseled on maintaining adequate hydration and avoiding nephrotoxic's.  Continue to monitor

## 2022-08-13 ENCOUNTER — Other Ambulatory Visit: Payer: Self-pay | Admitting: Family Medicine

## 2022-08-13 DIAGNOSIS — I10 Essential (primary) hypertension: Secondary | ICD-10-CM

## 2022-08-28 ENCOUNTER — Ambulatory Visit (INDEPENDENT_AMBULATORY_CARE_PROVIDER_SITE_OTHER): Payer: 59 | Admitting: Internal Medicine

## 2022-08-28 ENCOUNTER — Encounter (INDEPENDENT_AMBULATORY_CARE_PROVIDER_SITE_OTHER): Payer: Self-pay | Admitting: Internal Medicine

## 2022-08-28 VITALS — BP 129/74 | HR 80 | Temp 98.2°F | Ht 62.0 in | Wt 275.0 lb

## 2022-08-28 DIAGNOSIS — R7303 Prediabetes: Secondary | ICD-10-CM

## 2022-08-28 DIAGNOSIS — Z6841 Body Mass Index (BMI) 40.0 and over, adult: Secondary | ICD-10-CM

## 2022-08-28 DIAGNOSIS — I10 Essential (primary) hypertension: Secondary | ICD-10-CM

## 2022-08-28 NOTE — Progress Notes (Signed)
Office: [phone]  /  Fax: [facility] Height: 5' 2"$  (1.575 m) Weight: 275 lb (124.7 kg) Temp: 98.2 F (36.8 C) Pulse Rate: 80 BP: 129/74 SpO2: 97 % Fasting: No Labs: No Today's Visit #: 6 Weight at Last VIsit: 281 lb Weight Lost Since Last Visit: 6 lb  Body Fat %: 53.4 % Fat Mass (lbs): 147.2 lbs Muscle Mass (lbs): 122 lbs Total Body Water (lbs): 98.4 lbs Visceral Fat Rating : 19 Peak Weight: 281 lb Starting Date: 04/24/22 Starting Weight: 276 lb Total Weight Loss (lbs): 6 lb (2.722 kg)    HPI  Chief Complaint: OBESITY  Autumn Curry is here to discuss her progress with her obesity treatment plan. She is on the the Category 3 Plan and states she is following her eating plan approximately 90 % of the time. She states she is not exercising.   Interval History:  Since last office visit she has lost 6 pounds.  She is made a lot of significant changes.  Has been following the plan also eating more fruits and getting protein with breakfast lunch and dinner.  She reports adequate satiety and satiation.  She denies abnormal cravings.  She had been prescribed Wegovy by primary care physician but has not received the response from pharmacy.  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   Pharmacotherapy: Wegovy prescribed by PCP, approval pending possibly  PHYSICAL EXAM:  Blood pressure 129/74, pulse 80, temperature 98.2 F (36.8 C), height 5' 2"$  (1.575 m), weight 275 lb (124.7 kg), SpO2 97 %. Body mass index is 50.3 kg/m.  General: She is overweight, cooperative, alert, well developed, and in no acute distress. PSYCH: Has normal mood, affect and thought process.   HEENT: EOMI, sclerae are anicteric. Lungs: Normal breathing effort, no conversational dyspnea. Extremities: No edema.  Neurologic: No gross sensory or motor  deficits. No tremors or fasciculations noted.    ASSESSMENT AND PLAN  TREATMENT PLAN FOR OBESITY:  Recommended Dietary Goals  Autumn Curry is currently in the action stage of change. As such, her goal is to continue weight management plan. She has agreed to the Category 3 Plan.  Behavioral Intervention  We discussed the following Behavioral Modification Strategies today: increasing lean protein intake, increasing vegetables, increase water intake, work on meal planning and easy cooking plans, and think about ways to increase physical activity.  Additional resources provided today: NA  Recommended Physical Activity Goals  Autumn Curry has been advised to work up to 150 minutes of moderate intensity aerobic activity a week and strengthening exercises 2-3 times per week for cardiovascular health, weight loss maintenance and preservation of muscle mass.   She has agreed to increase physical activity in their day and reduce sedentary time (increase NEAT).    Pharmacotherapy We discussed various medication options to help Autumn Curry with her weight loss efforts and we both agreed to continue with nutritional and behavioral strategies.  ASSOCIATED CONDITIONS ADDRESSED TODAY  Pre-diabetes Assessment & Plan: Most recent A1c is  Lab Results  Component Value Date   HGBA1C 6.1 07/24/2022   with associated elevated insulin levels.  Patient informed of disease state and risk of progression. This may contribute to abnormal cravings, fatigue and diabetes complications without having diabetes.   She will continue to follow weight loss plan.  Has been prescribed Wegovy by PCP.  Advised to follow-up  to check on status possibly that medication was denied or either requires a prior authorization.    Hypertension, essential Assessment & Plan: Blood pressure at goal for age and risk category.  On hydrochlorothiazide 25 mg a day, and atenolol which may contribute to weight gain.  No adverse effects most recent  renal parameters reviewed which showed normal electrolytes and kidney function.  Continue with weight loss therapy.  Monitor for symptoms of orthostasis while losing weight. Continue current regimen and home monitoring for a goal blood pressure of 120/80.    Class 3 severe obesity without serious comorbidity with body mass index (BMI) of 50.0 to 59.9 in adult, unspecified obesity type [facility])      DIAGNOSTIC DATA REVIEWED:  BMET    Component Value Date/Time   NA 139 07/24/2022 1537   NA 140 05/08/2022 1553   K 3.9 07/24/2022 1537   CL 99 07/24/2022 1537   CO2 32 07/24/2022 1537   GLUCOSE 88 07/24/2022 1537   BUN 17 07/24/2022 1537   BUN 16 05/08/2022 1553   CREATININE 0.87 07/24/2022 1537   CREATININE 0.86 06/24/2022 1514   CALCIUM 9.5 07/24/2022 1537   GFRNONAA 99 03/15/2020 0758   GFRNONAA 78 04/26/2019 1429   GFRAA 114 03/15/2020 0758   GFRAA 91 04/26/2019 1429   Lab Results  Component Value Date   HGBA1C 6.1 07/24/2022   HGBA1C 5.6 05/25/2017   Lab Results  Component Value Date   INSULIN 40.5 (H) 04/24/2022   INSULIN 31.4 (H) 12/14/2017   Lab Results  Component Value Date   TSH 2.18 07/24/2022   CBC    Component Value Date/Time   WBC 7.1 07/24/2022 1537   RBC 4.03 07/24/2022 1537   HGB 12.3 07/24/2022 1537   HGB 13.0 05/08/2022 1553   HCT 37.0 07/24/2022 1537   HCT 37.1 05/08/2022 1553   PLT 346.0 07/24/2022 1537   MCV 92.0 07/24/2022 1537   MCV 88 05/08/2022 1553   MCH 30.6 06/24/2022 1514   MCHC 33.1 07/24/2022 1537   RDW 13.3 07/24/2022 1537   RDW 12.5 05/08/2022 1553   Iron Studies No results found for: "IRON", "TIBC", "FERRITIN", "IRONPCTSAT" Lipid Panel     Component Value Date/Time   CHOL 224 (H) 07/24/2022 1537   CHOL 249 (H) 04/24/2022 0941   TRIG 198.0 (H) 07/24/2022 1537   HDL 38.20 (L) 07/24/2022 1537   HDL 43 04/24/2022 0941   CHOLHDL 6 07/24/2022 1537   VLDL 39.6 07/24/2022 1537   LDLCALC 147 (H) 07/24/2022 1537   LDLCALC 147  (H) 06/24/2022 1514   Hepatic Function Panel     Component Value Date/Time   PROT 7.2 07/24/2022 1537   PROT 7.4 05/08/2022 1553   ALBUMIN 4.0 07/24/2022 1537   ALBUMIN 4.2 05/08/2022 1553   AST 14 07/24/2022 1537   ALT 16 07/24/2022 1537   ALKPHOS 75 07/24/2022 1537   BILITOT 0.3 07/24/2022 1537   BILITOT 0.3 05/08/2022 1553      Component Value Date/Time   TSH 2.18 07/24/2022 1537   Nutritional Lab Results  Component Value Date   VD25OH 21.76 (L) 07/24/2022   VD25OH 31.2 05/08/2022   VD25OH 28.02 (L) 06/03/2021      Return in about 2 weeks (around 09/11/2022) for For Weight Mangement with Dr. Gerarda Fraction.[name] She was informed of the importance of frequent follow up visits to maximize her success with intensive lifestyle modifications for her multiple health conditions.    ATTESTASTION STATEMENTS:  Reviewed by  clinician on day of visit: allergies, medications, problem list, medical history, surgical history, family history, social history, and previous encounter notes.   Time spent on visit including pre-visit chart review and post-visit care and charting was 30 minutes.    Thomes Dinning, MD

## 2022-08-28 NOTE — Assessment & Plan Note (Signed)
Blood pressure at goal for age and risk category.  On hydrochlorothiazide 25 mg a day, and atenolol which may contribute to weight gain.  No adverse effects most recent renal parameters reviewed which showed normal electrolytes and kidney function.  Continue with weight loss therapy.  Monitor for symptoms of orthostasis while losing weight. Continue current regimen and home monitoring for a goal blood pressure of 120/80.

## 2022-08-28 NOTE — Assessment & Plan Note (Signed)
Most recent A1c is  Lab Results  Component Value Date   HGBA1C 6.1 07/24/2022   with associated elevated insulin levels.  Patient informed of disease state and risk of progression. This may contribute to abnormal cravings, fatigue and diabetes complications without having diabetes.   She will continue to follow weight loss plan.  Has been prescribed Wegovy by PCP.  Advised to follow-up to check on status possibly that medication was denied or either requires a prior authorization.

## 2022-08-30 ENCOUNTER — Other Ambulatory Visit: Payer: Self-pay | Admitting: Family Medicine

## 2022-08-30 DIAGNOSIS — I10 Essential (primary) hypertension: Secondary | ICD-10-CM

## 2022-09-11 ENCOUNTER — Telehealth (INDEPENDENT_AMBULATORY_CARE_PROVIDER_SITE_OTHER): Payer: 59 | Admitting: Physician Assistant

## 2022-12-03 ENCOUNTER — Telehealth (INDEPENDENT_AMBULATORY_CARE_PROVIDER_SITE_OTHER): Payer: 59 | Admitting: Family Medicine

## 2022-12-03 ENCOUNTER — Encounter (INDEPENDENT_AMBULATORY_CARE_PROVIDER_SITE_OTHER): Payer: Self-pay | Admitting: Family Medicine

## 2022-12-03 DIAGNOSIS — R7303 Prediabetes: Secondary | ICD-10-CM

## 2022-12-03 DIAGNOSIS — R4589 Other symptoms and signs involving emotional state: Secondary | ICD-10-CM | POA: Diagnosis not present

## 2022-12-03 DIAGNOSIS — F509 Eating disorder, unspecified: Secondary | ICD-10-CM | POA: Insufficient documentation

## 2022-12-03 DIAGNOSIS — Z6841 Body Mass Index (BMI) 40.0 and over, adult: Secondary | ICD-10-CM

## 2022-12-03 DIAGNOSIS — F5089 Other specified eating disorder: Secondary | ICD-10-CM

## 2022-12-03 NOTE — Progress Notes (Signed)
TeleHealth Visit:  This visit was completed with telemedicine (audio/video) technology. Autumn Curry has verbally consented to this TeleHealth visit. The patient is located at home, the provider is located at home. The participants in this visit include the listed provider and patient. The visit was conducted today via MyChart video.  OBESITY Autumn Curry is here to discuss her progress with her obesity treatment plan along with follow-up of her obesity related diagnoses.   Today's visit was # 7 Starting weight: 276 lbs Starting date: 04/24/22 Weight at last in office visit: 275 lbs on 08/28/22 Total weight loss: 1 lbs at last in office visit on 08/28/22. Today's reported weight (12/03/22): none reported  Nutrition Plan: the Category 3 plan   Current exercise:  went to gym once in the past week.   Interim History:  Last OV was 08/28/22. She started back on plan last week and has been following it well. She has already lost some weight.  She declines Wegovy at this time because she doesn't want to take medication long term. 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  She doesn't skip meals.  Has sweet tea once weekly when she eats out on Sunday. Drinks mostly water but not enough.  Worse habit is snacking between meals- likes sweets. Husband likes to keep sweets in the house. She has focused on eating at home more recently. She has our eating out guide.  Denies polyphagia but reports cravings for sweets. Assessment/Plan:  1. Prediabetes Last A1c was 6.1 on 07/24/2022.  Fasting insulins have been high, last one was 40 on 04/24/2022. Medication(s): None Polyphagia:No Lab Results  Component Value Date   HGBA1C 6.1 07/24/2022   HGBA1C 5.7 (H) 05/08/2022   HGBA1C 5.6 11/22/2021   HGBA1C 6.0 06/03/2021   HGBA1C 5.9 (H) 02/19/2021   Lab Results  Component Value Date   INSULIN 40.5 (H) 04/24/2022   INSULIN 49.4 (H) 02/19/2021   INSULIN 51.3 (H) 03/15/2020   INSULIN 30.3  (H) 10/10/2019   INSULIN 60.9 (H) 02/22/2019    Plan: Consider starting metformin if A1c has increased since last check. Check A1c and fasting insulin next office visit.  2. Eating disorder/emotional eating Reports sweets cravings.  She says her husband is likely to sabotage by bringing sweets in the house. Currently this is moderately controlled. Overall mood is stable. Medication(s): none  Plan: Ask husband for his cooperation with her weight loss endeavor. Continue to monitor. Consider referral to Dr. Dewaine Conger. Consider starting bupropion or topiramate.   3. Morbid Obesity: Current BMI 50 Autumn Curry is currently in the action stage of change. As such, her goal is to continue with weight loss efforts.  She has agreed to the Category 3 plan.   1.  Increase water intake. 2.  Have protein snacks rather than carbohydrates. 3.  Cut out sweet tea.  Exercise goals:  exercise  2-3 times per week until next office visit..   Behavioral modification strategies: increasing lean protein intake, decreasing simple carbohydrates , decrease eating out, decrease liquid calories, increase water intake, better snacking choices, planning for success, decrease junk food, and get rid of junk food in the home.  Autumn Curry has agreed to follow-up with our clinic in 3 weeks with fasting labs.  No orders of the defined types were placed in this encounter.   There are no discontinued medications.   No orders of the defined types were placed in this encounter.     Objective:   VITALS: Per patient if  applicable, see vitals. GENERAL: Alert and in no acute distress. CARDIOPULMONARY: No increased WOB. Speaking in clear sentences.  PSYCH: Pleasant and cooperative. Speech normal rate and rhythm. Affect is appropriate. Insight and judgement are appropriate. Attention is focused, linear, and appropriate.  NEURO: Oriented as arrived to appointment on time with no prompting.   Attestation Statements:    Reviewed by clinician on day of visit: allergies, medications, problem list, medical history, surgical history, family history, social history, and previous encounter notes.  Time spent on visit including the items listed below was 30 minutes.  -preparing to see the patient (e.g., review of tests, history, previous notes) -obtaining and/or reviewing separately obtained history -counseling and educating the patient/family/caregiver -documenting clinical information in the electronic or other health record -ordering medications, tests, or procedures -independently interpreting results and communicating results to the patient/ family/caregiver -referring and communicating with other health care professionals  -care coordination   This was prepared with the assistance of Engineer, civil (consulting).  Occasional wrong-word or sound-a-like substitutions may have occurred due to the inherent limitations of voice recognition software.

## 2022-12-23 ENCOUNTER — Encounter (INDEPENDENT_AMBULATORY_CARE_PROVIDER_SITE_OTHER): Payer: Self-pay | Admitting: Physician Assistant

## 2022-12-23 ENCOUNTER — Ambulatory Visit (INDEPENDENT_AMBULATORY_CARE_PROVIDER_SITE_OTHER): Payer: 59 | Admitting: Physician Assistant

## 2022-12-23 VITALS — BP 135/84 | HR 79 | Temp 97.9°F | Ht 62.0 in | Wt 271.0 lb

## 2022-12-23 DIAGNOSIS — I10 Essential (primary) hypertension: Secondary | ICD-10-CM

## 2022-12-23 DIAGNOSIS — E785 Hyperlipidemia, unspecified: Secondary | ICD-10-CM | POA: Diagnosis not present

## 2022-12-23 DIAGNOSIS — R5383 Other fatigue: Secondary | ICD-10-CM

## 2022-12-23 DIAGNOSIS — E559 Vitamin D deficiency, unspecified: Secondary | ICD-10-CM

## 2022-12-23 DIAGNOSIS — Z6841 Body Mass Index (BMI) 40.0 and over, adult: Secondary | ICD-10-CM

## 2022-12-23 DIAGNOSIS — R7303 Prediabetes: Secondary | ICD-10-CM

## 2022-12-23 DIAGNOSIS — F5089 Other specified eating disorder: Secondary | ICD-10-CM

## 2022-12-23 NOTE — Progress Notes (Signed)
.smr  Office: [phone]  /  Fax: [phone]  WEIGHT SUMMARY AND BIOMETRICS  Vitals Temp: 97.9 F (36.6 C) BP: 135/84 Pulse Rate: 79 SpO2: 92 %   Anthropometric Measurements Height: 5\' 2"  (1.575 m) Weight: 271 lb (122.9 kg) BMI (Calculated): 49.55 Weight at Last Visit: 275 lb Weight Lost Since Last Visit: 4 lb Starting Weight: 276 lb   Body Composition  Body Fat %: 52 % Fat Mass (lbs): 141.4 lbs Muscle Mass (lbs): 123.8 lbs Total Body Water (lbs): 93 lbs Visceral Fat Rating : 18   Other Clinical Data Fasting: Yes Labs: no Today's Visit #: 7 Starting Date: 04/24/22     HPI  Chief Complaint: OBESITY  Joci is here to discuss her progress with her obesity treatment plan. She is on the the Category 3 Plan and states she is following her eating plan approximately 50 % of the time. She states she is exercising 0 minutes 0 times per week.   Interval History:  Since last office visit she down 4 lbs.  Just returned from a brief trip to the [place]! Had VV with Adah Salvage, FNP 12/03/22. Last in OV 08/28/22 with Dr. Rikki Spearing.   Hunger/appetite-not excessive Cravings- struggling with some craving for sweets and increased problems with schedule change over summer . 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 Discussed better snack options, 100 calorie protein snack hand out provided.   Stress- Decreased now that school out for summer Sleep- restorative Exercise-inconsistent, plans to try to get back into going to the gym consistently over the summer. Hydration-trying to increase to 64-80 oz daily.  Has trip to [facility] next month.  Husband sings with church and has lost of events.   Pharmacotherapy: None for weight loss  TREATMENT PLAN FOR OBESITY: Fasting labs obtained today.  She was informed we would discuss her lab results at her next visit unless there is a critical issue that needs to be addressed sooner. She agreed to  keep her next visit at the agreed upon time to discuss these results.   Recommended Dietary Goals  Peter is currently in the action stage of change. As such, her goal is to continue weight management plan. She has agreed to the Category 3 Plan.  Behavioral Intervention  We discussed the following Behavioral Modification Strategies today: increasing lean protein intake, decreasing simple carbohydrates , increasing vegetables, increasing lower glycemic fruits, increasing fiber rich foods, avoiding skipping meals, increasing water intake, continue to practice mindfulness when eating, planning for success, and better snacking choices.  Additional resources provided today: NA  Recommended Physical Activity Goals  Xela has been advised to work up to 150 minutes of moderate intensity aerobic activity a week and strengthening exercises 2-3 times per week for cardiovascular health, weight loss maintenance and preservation of muscle mass.   She has agreed to Continue current level of physical activity  and Work on scheduling and tracking physical activity.    Pharmacotherapy We discussed various medication options to help Enisha with her weight loss efforts and we both agreed to continue to work on nutritional and behavioral strategies to promote weight loss.     Return in about 4 weeks (around 01/20/2023).[name] She was informed of the importance of frequent follow up visits to maximize her success with intensive lifestyle modifications for her multiple health conditions.  PHYSICAL EXAM:  Blood pressure 135/84, pulse 79, temperature 97.9 F (36.6 C), height 5\' 2"  (1.575 m), weight 271 lb (122.9 kg), SpO2  92 %. Body mass index is 49.57 kg/m.  General: She is overweight, cooperative, alert, well developed, and in no acute distress. PSYCH: Has normal mood, affect and thought process.   Cardiovascular: HR 70's regular. BP 135/84 Lungs: Normal breathing effort, no conversational dyspnea. Neuro:  no focal deficits  DIAGNOSTIC DATA REVIEWED:  BMET    Component Value Date/Time   NA 139 07/24/2022 1537   NA 140 05/08/2022 1553   K 3.9 07/24/2022 1537   CL 99 07/24/2022 1537   CO2 32 07/24/2022 1537   GLUCOSE 88 07/24/2022 1537   BUN 17 07/24/2022 1537   BUN 16 05/08/2022 1553   CREATININE 0.87 07/24/2022 1537   CREATININE 0.86 06/24/2022 1514   CALCIUM 9.5 07/24/2022 1537   GFRNONAA 99 03/15/2020 0758   GFRNONAA 78 04/26/2019 1429   GFRAA 114 03/15/2020 0758   GFRAA 91 04/26/2019 1429   Lab Results  Component Value Date   HGBA1C 6.1 07/24/2022   HGBA1C 5.6 05/25/2017   Lab Results  Component Value Date   INSULIN 40.5 (H) 04/24/2022   INSULIN 31.4 (H) 12/14/2017   Lab Results  Component Value Date   TSH 2.18 07/24/2022   CBC    Component Value Date/Time   WBC 7.1 07/24/2022 1537   RBC 4.03 07/24/2022 1537   HGB 12.3 07/24/2022 1537   HGB 13.0 05/08/2022 1553   HCT 37.0 07/24/2022 1537   HCT 37.1 05/08/2022 1553   PLT 346.0 07/24/2022 1537   MCV 92.0 07/24/2022 1537   MCV 88 05/08/2022 1553   MCH 30.6 06/24/2022 1514   MCHC 33.1 07/24/2022 1537   RDW 13.3 07/24/2022 1537   RDW 12.5 05/08/2022 1553   Iron Studies No results found for: "IRON", "TIBC", "FERRITIN", "IRONPCTSAT" Lipid Panel     Component Value Date/Time   CHOL 224 (H) 07/24/2022 1537   CHOL 249 (H) 04/24/2022 0941   TRIG 198.0 (H) 07/24/2022 1537   HDL 38.20 (L) 07/24/2022 1537   HDL 43 04/24/2022 0941   CHOLHDL 6 07/24/2022 1537   VLDL 39.6 07/24/2022 1537   LDLCALC 147 (H) 07/24/2022 1537   LDLCALC 147 (H) 06/24/2022 1514   Hepatic Function Panel     Component Value Date/Time   PROT 7.2 07/24/2022 1537   PROT 7.4 05/08/2022 1553   ALBUMIN 4.0 07/24/2022 1537   ALBUMIN 4.2 05/08/2022 1553   AST 14 07/24/2022 1537   ALT 16 07/24/2022 1537   ALKPHOS 75 07/24/2022 1537   BILITOT 0.3 07/24/2022 1537   BILITOT 0.3 05/08/2022 1553      Component Value Date/Time   TSH 2.18  07/24/2022 1537   Nutritional Lab Results  Component Value Date   VD25OH 21.76 (L) 07/24/2022   VD25OH 31.2 05/08/2022   VD25OH 28.02 (L) 06/03/2021    ASSOCIATED CONDITIONS ADDRESSED TODAY  ASSESSMENT AND PLAN  Problem List Items Addressed This Visit     Morbid obesity (HCC)   Hyperlipidemia   Relevant Orders   Lipid Panel With LDL/HDL Ratio   Vitamin D deficiency   Relevant Orders   VITAMIN D 25 Hydroxy (Vit-D Deficiency, Fractures)   Hypertension, essential   Other fatigue   Relevant Orders   Vitamin B12   CBC with Differential/Platelet   TSH   Pre-diabetes - Primary   Relevant Orders   CMP14+EGFR   Hemoglobin A1c   Insulin, random   Eating disorder   BMI 45.0-49.9, adult (HCC) Current BMI 49.7   Prediabetes Last A1c was 6.1  Medication(s): None Polyphagia:No She is working on nutrition plan to decrease simple carbohydrates, increase lean proteins and exercise to promote weight loss, improve glycemic control and prevent progression to Type 2 diabetes.   Lab Results  Component Value Date   HGBA1C 6.1 07/24/2022   HGBA1C 5.7 (H) 05/08/2022   HGBA1C 5.6 11/22/2021   HGBA1C 6.0 06/03/2021   HGBA1C 5.9 (H) 02/19/2021   Lab Results  Component Value Date   INSULIN 40.5 (H) 04/24/2022   INSULIN 49.4 (H) 02/19/2021   INSULIN 51.3 (H) 03/15/2020   INSULIN 30.3 (H) 10/10/2019   INSULIN 60.9 (H) 02/22/2019    Plan:  Recheck fasting labs today. Consider starting metformin if A1c/insulin are increasing.  Continue working on nutrition plan to decrease simple carbohydrates, increase lean proteins and exercise to promote weight loss, improve glycemic control and prevent progression to Type 2 diabetes.   Hypertension Hypertension reasonably well controlled, no significant medication side effects noted, and needs further observation.  Medication(s): atenolol 25 mg daily   HCTZ 25 mg daily  Renal function normal.   BP Readings from Last 3 Encounters:  12/23/22  135/84  08/28/22 129/74  08/11/22 139/79   Lab Results  Component Value Date   CREATININE 0.87 07/24/2022   CREATININE 0.86 06/24/2022   CREATININE 1.21 (H) 05/08/2022   Lab Results  Component Value Date   GFR 82.23 07/24/2022   GFR 78.54 06/03/2021   GFR 91.26 05/26/2019    Plan: Continue all antihypertensives at current dosages. Recheck labs today. Continue to work on nutrition plan to promote weight loss and improve BP control.   Hyperlipidemia LDL is not at goal. HDL is < 40. Triglycerides 198- not at goal Medication(s): None Cardiovascular risk factors: dyslipidemia, hypertension, obesity (BMI >= 30 kg/m2), and sedentary lifestyle  Lab Results  Component Value Date   CHOL 224 (H) 07/24/2022   HDL 38.20 (L) 07/24/2022   LDLCALC 147 (H) 07/24/2022   TRIG 198.0 (H) 07/24/2022   CHOLHDL 6 07/24/2022   CHOLHDL 5.4 (H) 06/24/2022   CHOLHDL 5.6 (H) 11/22/2021   Lab Results  Component Value Date   ALT 16 07/24/2022   AST 14 07/24/2022   ALKPHOS 75 07/24/2022   BILITOT 0.3 07/24/2022   The 10-year ASCVD risk score (Arnett DK, et al., 2019) is: 13.1%   Values used to calculate the score:     Age: 9 years     Sex: Female     Is Non-Hispanic African American: Yes     Diabetic: Yes     Tobacco smoker: No     Systolic Blood Pressure: 135 mmHg     Is BP treated: Yes     HDL Cholesterol: 38.2 mg/dL     Total Cholesterol: 224 mg/dL  Plan: Recheck labs today.  Consider starting statin therapy.  Continue to work on nutrition plan -decreasing simple carbohydrates, increasing lean proteins, decreasing saturated fats and cholesterol , avoiding trans fats and exercise as able to promote weight loss, improve lipids and decrease cardiovascular risks.  Vitamin D Deficiency Vitamin D is not at goal of 50.  Most recent vitamin D level was 21.76. She is on no vitamin D supplementation currently. Lab Results  Component Value Date   VD25OH 21.76 (L) 07/24/2022   VD25OH 31.2  05/08/2022   VD25OH 28.02 (L) 06/03/2021    Plan:  Recheck vitamin D level today. And supplement vitamin D based on lab results.  Low vitamin D levels can be associated with adiposity and  may result in leptin resistance and weight gain. Also associated with fatigue. Currently on vitamin D supplementation without any adverse effects.    Fatigue:  Endorses fatigue.  Plan:  Fatigue may be related to obesity, depression or many other causes. Labs will be ordered,CBC, B 12, vitamin D and TSH and in the meanwhile, Ranisha will focus on self care including making healthy food choices, increasing physical activity and focusing on stress reduction.  Eating disorder/emotional eating Blue has had issues with stress/emotional eating. Currently this is poorly controlled. Overall mood is stable. Medication(s): None currently.   Plan: She is struggling with emotional eating/some sabotage with husband bringing sweets into the home. Referral to Dr. Dewaine Conger made today.  Consider medication in future if needed.   ATTESTASTION STATEMENTS:  Reviewed by clinician on day of visit: allergies, medications, problem list, medical history, surgical history, family history, social history, and previous encounter notes.   I have personally spent 45 minutes total time today in preparation, patient care, nutritional counseling and documentation for this visit, including the following: review of clinical lab tests; review of medical tests/procedures/services.      Montford Barg, PA-C

## 2022-12-24 LAB — CBC WITH DIFFERENTIAL/PLATELET
Basophils Absolute: 0 10*3/uL (ref 0.0–0.2)
Basos: 1 %
EOS (ABSOLUTE): 0.1 10*3/uL (ref 0.0–0.4)
Eos: 1 %
Hematocrit: 37.7 % (ref 34.0–46.6)
Hemoglobin: 12.5 g/dL (ref 11.1–15.9)
Immature Grans (Abs): 0 10*3/uL (ref 0.0–0.1)
Immature Granulocytes: 0 %
Lymphocytes Absolute: 2.9 10*3/uL (ref 0.7–3.1)
Lymphs: 50 %
MCH: 29.8 pg (ref 26.6–33.0)
MCHC: 33.2 g/dL (ref 31.5–35.7)
MCV: 90 fL (ref 79–97)
Monocytes Absolute: 0.3 10*3/uL (ref 0.1–0.9)
Monocytes: 5 %
Neutrophils Absolute: 2.5 10*3/uL (ref 1.4–7.0)
Neutrophils: 43 %
Platelets: 360 10*3/uL (ref 150–450)
RBC: 4.19 x10E6/uL (ref 3.77–5.28)
RDW: 13.1 % (ref 11.7–15.4)
WBC: 5.8 10*3/uL (ref 3.4–10.8)

## 2022-12-24 LAB — CMP14+EGFR
ALT: 22 IU/L (ref 0–32)
AST: 17 IU/L (ref 0–40)
Albumin/Globulin Ratio: 1.2
Albumin: 4.1 g/dL (ref 3.9–4.9)
Alkaline Phosphatase: 75 IU/L (ref 44–121)
BUN/Creatinine Ratio: 18 (ref 9–23)
BUN: 15 mg/dL (ref 6–24)
Bilirubin Total: 0.3 mg/dL (ref 0.0–1.2)
CO2: 24 mmol/L (ref 20–29)
Calcium: 9.4 mg/dL (ref 8.7–10.2)
Chloride: 100 mmol/L (ref 96–106)
Creatinine, Ser: 0.83 mg/dL (ref 0.57–1.00)
Globulin, Total: 3.4 g/dL (ref 1.5–4.5)
Glucose: 107 mg/dL — ABNORMAL HIGH (ref 70–99)
Potassium: 4.1 mmol/L (ref 3.5–5.2)
Sodium: 140 mmol/L (ref 134–144)
Total Protein: 7.5 g/dL (ref 6.0–8.5)
eGFR: 90 mL/min/{1.73_m2} (ref >59)

## 2022-12-24 LAB — LIPID PANEL WITH LDL/HDL RATIO
Cholesterol, Total: 218 mg/dL — ABNORMAL HIGH (ref 100–199)
HDL: 41 mg/dL (ref >39)
LDL Chol Calc (NIH): 156 mg/dL — ABNORMAL HIGH (ref 0–99)
LDL/HDL Ratio: 3.8 ratio — ABNORMAL HIGH (ref 0.0–3.2)
Triglycerides: 117 mg/dL (ref 0–149)
VLDL Cholesterol Cal: 21 mg/dL (ref 5–40)

## 2022-12-24 LAB — HEMOGLOBIN A1C
Est. average glucose Bld gHb Est-mCnc: 117 mg/dL
Hgb A1c MFr Bld: 5.7 % — ABNORMAL HIGH (ref 4.8–5.6)

## 2022-12-24 LAB — VITAMIN B12: Vitamin B-12: 849 pg/mL (ref 232–1245)

## 2022-12-24 LAB — INSULIN, RANDOM: INSULIN: 46.7 u[IU]/mL — ABNORMAL HIGH (ref 2.6–24.9)

## 2022-12-24 LAB — TSH: TSH: 1.52 u[IU]/mL (ref 0.450–4.500)

## 2022-12-24 LAB — VITAMIN D 25 HYDROXY (VIT D DEFICIENCY, FRACTURES): Vit D, 25-Hydroxy: 36.1 ng/mL (ref 30.0–100.0)

## 2023-01-06 ENCOUNTER — Ambulatory Visit (INDEPENDENT_AMBULATORY_CARE_PROVIDER_SITE_OTHER): Payer: 59 | Admitting: Internal Medicine

## 2023-01-06 ENCOUNTER — Encounter (INDEPENDENT_AMBULATORY_CARE_PROVIDER_SITE_OTHER): Payer: Self-pay | Admitting: Internal Medicine

## 2023-01-06 VITALS — BP 128/84 | HR 87 | Temp 98.4°F | Ht 62.0 in | Wt 273.0 lb

## 2023-01-06 DIAGNOSIS — R7303 Prediabetes: Secondary | ICD-10-CM | POA: Diagnosis not present

## 2023-01-06 DIAGNOSIS — Z6841 Body Mass Index (BMI) 40.0 and over, adult: Secondary | ICD-10-CM

## 2023-01-06 DIAGNOSIS — E669 Obesity, unspecified: Secondary | ICD-10-CM

## 2023-01-06 DIAGNOSIS — E7800 Pure hypercholesterolemia, unspecified: Secondary | ICD-10-CM | POA: Diagnosis not present

## 2023-01-06 DIAGNOSIS — I10 Essential (primary) hypertension: Secondary | ICD-10-CM | POA: Diagnosis not present

## 2023-01-06 DIAGNOSIS — E559 Vitamin D deficiency, unspecified: Secondary | ICD-10-CM

## 2023-01-06 NOTE — Progress Notes (Unsigned)
Office: [phone]  /  Fax: [phone]  WEIGHT SUMMARY AND BIOMETRICS  Vitals Temp: 98.4 F (36.9 C) BP: 128/84 Pulse Rate: 87 SpO2: 96 %   Anthropometric Measurements Height: 5\' 2"  (1.575 m) Weight: 273 lb (123.8 kg) BMI (Calculated): 49.92 Weight at Last Visit: 271lb Weight Lost Since Last Visit: 0 Weight Gained Since Last Visit: 2lb Starting Weight: 276lb Total Weight Loss (lbs): 4 lb (1.814 kg)   Body Composition  Body Fat %: 49.4 % Fat Mass (lbs): 135 lbs Muscle Mass (lbs): 131.4 lbs Total Body Water (lbs): 98.4 lbs Visceral Fat Rating : 17    No data recorded Today's Visit #: 8  Starting Date: 04/24/22   HPI  Chief Complaint: OBESITY  Halo is here to discuss her progress with her obesity treatment plan. She is on the the Category 3 Plan and states she is following her eating plan approximately 75 % of the time. She states she is exercising some, cardio and walking 15-30 minutes 2-3 times per week.  Interval History:  Since last office visit she has gained 2 lbs. She reports good adherence to reduced calorie nutritional plan. She has been working on reading food labels, not skipping meals, increasing protein intake at every meal, eating more fruits, eating more vegetables, and begun to exercise  24 hr: morningstar bacon, lunch chicken salad, 6 crackers, side salad with skinny girl dressing. Dinner: subway phill[place]ak with regular bread.  Orixegenic Control: Reports problems with appetite and hunger signals.  Denies problems with satiety and satiation.  Denies problems with eating patterns and portion control.  Reports abnormal cravings. Denies feeling deprived or restricted.   Barriers identified: strong hunger signals and appetite.   Pharmacotherapy for weight loss: She is currently taking no anti-obesity medication.    ASSESSMENT AND PLAN  TREATMENT PLAN FOR OBESITY:   Recommended Dietary Goals  Faryal is currently in the action  stage of change. As such, her goal is to continue weight management plan. She has agreed to: {EMWTLOSSPLAN:29297::"continue current plan"}  Behavioral Intervention  We discussed the following Behavioral Modification Strategies today: {EMWMwtlossstrategies:28914::"increasing lean protein intake","decreasing simple carbohydrates ","increasing vegetables","increasing lower glycemic fruits","increasing water intake","continue to practice mindfulness when eating","planning for success"}.  Additional resources provided today: {EMadditionalresources:29169::"None"}  Recommended Physical Activity Goals  Krystianna has been advised to work up to 150 minutes of moderate intensity aerobic activity a week and strengthening exercises 2-3 times per week for cardiovascular health, weight loss maintenance and preservation of muscle mass.   She has agreed to :  {EMEXERCISE:28847::"Think about ways to increase daily physical activity and overcoming barriers to exercise"}  Pharmacotherapy We discussed various medication options to help Brittan with her weight loss efforts and we both agreed to : {EMagreedrx:29170::"continue with nutritional and behavioral strategies"}  ASSOCIATED CONDITIONS ADDRESSED TODAY  There are no diagnoses linked to this encounter.  PHYSICAL EXAM:  Blood pressure 128/84, pulse 87, temperature 98.4 F (36.9 C), height 5\' 2"  (1.575 m), weight 273 lb (123.8 kg), SpO2 96 %. Body mass index is 49.93 kg/m.  General: She is overweight, cooperative, alert, well developed, and in no acute distress. PSYCH: Has normal mood, affect and thought process.   HEENT: EOMI, sclerae are anicteric. Lungs: Normal breathing effort, no conversational dyspnea. Extremities: No edema.  Neurologic: No gross sensory or motor deficits. No tremors or fasciculations noted.    DIAGNOSTIC DATA REVIEWED:  BMET    Component Value Date/Time   NA 140 12/23/2022 0909   K 4.1 12/23/2022 0909  CL 100 12/23/2022  0909   CO2 24 12/23/2022 0909   GLUCOSE 107 (H) 12/23/2022 0909   GLUCOSE 88 07/24/2022 1537   BUN 15 12/23/2022 0909   CREATININE 0.83 12/23/2022 0909   CREATININE 0.86 06/24/2022 1514   CALCIUM 9.4 12/23/2022 0909   GFRNONAA 99 03/15/2020 0758   GFRNONAA 78 04/26/2019 1429   GFRAA 114 03/15/2020 0758   GFRAA 91 04/26/2019 1429   Lab Results  Component Value Date   HGBA1C 5.7 (H) 12/23/2022   HGBA1C 5.6 05/25/2017   Lab Results  Component Value Date   INSULIN 46.7 (H) 12/23/2022   INSULIN 31.4 (H) 12/14/2017   Lab Results  Component Value Date   TSH 1.520 12/23/2022   CBC    Component Value Date/Time   WBC 5.8 12/23/2022 0909   WBC 7.1 07/24/2022 1537   RBC 4.19 12/23/2022 0909   RBC 4.03 07/24/2022 1537   HGB 12.5 12/23/2022 0909   HCT 37.7 12/23/2022 0909   PLT 360 12/23/2022 0909   MCV 90 12/23/2022 0909   MCH 29.8 12/23/2022 0909   MCH 30.6 06/24/2022 1514   MCHC 33.2 12/23/2022 0909   MCHC 33.1 07/24/2022 1537   RDW 13.1 12/23/2022 0909   Iron Studies No results found for: "IRON", "TIBC", "FERRITIN", "IRONPCTSAT" Lipid Panel     Component Value Date/Time   CHOL 218 (H) 12/23/2022 0909   TRIG 117 12/23/2022 0909   HDL 41 12/23/2022 0909   CHOLHDL 6 07/24/2022 1537   VLDL 39.6 07/24/2022 1537   LDLCALC 156 (H) 12/23/2022 0909   LDLCALC 147 (H) 06/24/2022 1514   Hepatic Function Panel     Component Value Date/Time   PROT 7.5 12/23/2022 0909   ALBUMIN 4.1 12/23/2022 0909   AST 17 12/23/2022 0909   ALT 22 12/23/2022 0909   ALKPHOS 75 12/23/2022 0909   BILITOT 0.3 12/23/2022 0909      Component Value Date/Time   TSH 1.520 12/23/2022 0909   Nutritional Lab Results  Component Value Date   VD25OH 36.1 12/23/2022   VD25OH 21.76 (L) 07/24/2022   VD25OH 31.2 05/08/2022     No follow-ups on file.[name] She was informed of the importance of frequent follow up visits to maximize her success with intensive lifestyle modifications for her multiple  health conditions.   ATTESTASTION STATEMENTS:  Reviewed by clinician on day of visit: allergies, medications, problem list, medical history, surgical history, family history, social history, and previous encounter notes.     Worthy Rancher, MD

## 2023-01-07 NOTE — Assessment & Plan Note (Signed)
LDL is not at goal. Elevated LDL may be secondary to nutrition, genetics and spillover effect from excess adiposity. Recommended LDL goal is <70 to reduce the risk of fatty streaks and the progression to obstructive ASCVD in the future. Her 10 year risk is: The 10-year ASCVD risk score (Arnett DK, et al., 2019) is: 8.5%  Lab Results  Component Value Date   CHOL 218 (H) 12/23/2022   HDL 41 12/23/2022   LDLCALC 156 (H) 12/23/2022   TRIG 117 12/23/2022   CHOLHDL 6 07/24/2022   She has family history of premature coronary artery disease.  Considering cardiovascular risk of more than 5% she benefits from statin therapy.  She will discuss this with her primary care physician. Continue weight loss therapy, losing 10% or more of body weight may improve condition. Also advised to reduce saturated fats in diet to less than 10% of daily calories.

## 2023-01-07 NOTE — Assessment & Plan Note (Signed)
Vitamin D levels have improved she will continue with over-the-counter supplementation with vitamin D3 2000 international units daily.

## 2023-01-07 NOTE — Assessment & Plan Note (Signed)
Blood pressure at goal for age and risk category.  On hydrochlorothiazide 25 mg a day, and atenolol which may contribute to weight gain.  Weight neutral beta-blockers include Bystolic and carvedilol.  No adverse effects most recent renal parameters reviewed which showed normal electrolytes and kidney function.  Continue with weight loss therapy.  Monitor for symptoms of orthostasis while losing weight. Continue current regimen and home monitoring for a goal blood pressure of 120/80.

## 2023-01-07 NOTE — Assessment & Plan Note (Signed)
Most recent A1c is  Lab Results  Component Value Date   HGBA1C 5.7 (H) 12/23/2022   with associated elevated insulin levels.  Patient informed of disease state and risk of progression. This may contribute to abnormal cravings, fatigue and diabetes complications without having diabetes.   She will continue to follow weight loss plan.  Has been prescribed Wegovy by PCP but has not heard back from insurance.  We had a long discussion about long-term use of medication and risk of weight regain with discontinuation.  At present time she is not interested and pursuing incretin therapy.  She also is intolerant to metformin.  Losing 10% of body weight may improve condition.

## 2023-01-19 ENCOUNTER — Telehealth (INDEPENDENT_AMBULATORY_CARE_PROVIDER_SITE_OTHER): Payer: 59 | Admitting: Psychology

## 2023-01-19 DIAGNOSIS — F5089 Other specified eating disorder: Secondary | ICD-10-CM | POA: Diagnosis not present
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
## 2023-01-22 ENCOUNTER — Encounter: Payer: Self-pay | Admitting: Family Medicine

## 2023-01-22 ENCOUNTER — Ambulatory Visit: Payer: 59 | Admitting: Family Medicine

## 2023-01-22 VITALS — BP 138/88 | HR 66 | Temp 98.6°F | Resp 18 | Ht 62.0 in | Wt 276.6 lb

## 2023-01-22 DIAGNOSIS — R600 Localized edema: Secondary | ICD-10-CM | POA: Diagnosis not present

## 2023-01-22 DIAGNOSIS — I10 Essential (primary) hypertension: Secondary | ICD-10-CM

## 2023-01-22 DIAGNOSIS — B353 Tinea pedis: Secondary | ICD-10-CM

## 2023-01-22 MED ORDER — NONFORMULARY OR COMPOUNDED ITEM
1 refills | Status: AC
Start: 2023-01-22 — End: ?

## 2023-01-22 MED ORDER — NYSTATIN-TRIAMCINOLONE 100000-0.1 UNIT/GM-% EX OINT
1.0000 | TOPICAL_OINTMENT | Freq: Two times a day (BID) | CUTANEOUS | 0 refills | Status: DC
Start: 2023-01-22 — End: 2023-02-05

## 2023-01-22 NOTE — Progress Notes (Signed)
Established Patient Office Visit  Subjective   Patient ID: Autumn Curry, female    DOB: 02/24/1981  Age: 42 y.o. MRN: [id number]  Chief Complaint  Patient presents with   Hypertension   Follow-up    HPI Discussed the use of AI scribe software for clinical note transcription with the patient, who gave verbal consent to proceed.  History of Present Illness   The patient, with a history of prediabetes and high cholesterol, presents for a follow-up visit. They report adherence to a diet and exercise regimen through a program called "[facility]" for the past two weeks. They have noticed some improvement in their health since starting the program. They have also had recent labs done, which showed high cholesterol and an A1C level that was better than six months ago but still in the prediabetic range. Their insulin level was also high, indicating a high carbohydrate intake. Their Vitamin D level was in the normal range but lower than the preferred level. They were previously prescribed Vitamin D when they first started the weight loss program and are advised to take over-the-counter Vitamin D3 to increase their levels.  The patient also reports a recent skin breakout on their foot, which was itchy but has since resolved. They used an antibacterial cream for treatment. They also mention some swelling, particularly when traveling, and are advised to wear compression socks, drink plenty of water, and keep their legs elevated as much as possible.      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   Hypertension, essential 04/24/2022   SOB (shortness of breath) 04/24/2022   Other fatigue  04/24/2022   Depression screening 04/24/2022   Class 3 severe obesity without serious comorbidity with body mass index (BMI) of 50.0 to 59.9 in adult [facility]) 04/24/2022   Anxiety 07/02/2021   At risk for impaired metabolic function 08/20/2020   Prediabetes 09/15/2019   Vitamin D deficiency 09/15/2019   Class 3 severe obesity with serious comorbidity and body mass index (BMI) of 50.0 to 59.9 in adult (HCC) 12/23/2018   Insulin resistance 12/23/2018   Hyperlipidemia 01/26/2018   Shortness of breath on exertion 12/14/2017   Essential hypertension 12/14/2017   Preventative health care 08/18/2016   Genital herpes 02/16/2012   Morbid obesity (HCC) 09/30/2011   AMENORRHEA 08/31/2007   BOILS, RECURRENT 08/31/2007   HEADACHE 08/31/2007   SYPHILIS NOS 09/11/2006   Human immunodeficiency virus (HIV) disease (HCC) 09/03/2006   Past Medical History:  Diagnosis Date   Absence of menstruation    Carbuncle and furuncle of unspecified site    Elevated blood pressure reading without diagnosis of hypertension    Encounter for long-term (current) use of other medications    Headache(784.0)    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   Hyperlipidemia    Hypertension    Migraine    Prediabetes    Routine general medical examination at a health care facility    Screening for malignant neoplasm of the cervix    Syphilis, unspecified    Past Surgical History:  Procedure Laterality Date   NO PAST SURGERIES     Social History   Tobacco Use   Smoking status:  Never   Smokeless tobacco: Never  Vaping Use   Vaping status: Never Used  Substance Use Topics   Alcohol use: Yes    Alcohol/week: 0.0 standard drinks of alcohol    Comment: rarely   Drug use: No   Social History   Socioeconomic History   Marital status: Married    Spouse name: Aundra Pung   Number of children: 1   Years of education: Not on file   Highest education level: Associate degree: academic program  Occupational  History   Occupation: Runner, broadcasting/film/video    Comment: head start   Tobacco Use   Smoking status: Never   Smokeless tobacco: Never  Vaping Use   Vaping status: Never Used  Substance and Sexual Activity   Alcohol use: Yes    Alcohol/week: 0.0 standard drinks of alcohol    Comment: rarely   Drug use: No   Sexual activity: Yes    Partners: Male    Birth control/protection: Condom    Comment: declined condoms  Other Topics Concern   Not on file  Social History Narrative   Not on file   Social Determinants of Health   Financial Resource Strain: Low Risk  (01/20/2023)   Overall Financial Resource Strain (CARDIA)    Difficulty of Paying Living Expenses: Not hard at all  Food Insecurity: No Food Insecurity (01/20/2023)   Hunger Vital Sign    Worried About Running Out of Food in the Last Year: Never true    Ran Out of Food in the Last Year: Never true  Transportation Needs: No Transportation Needs (01/20/2023)   PRAPARE - Administrator, Civil Service (Medical): No    Lack of Transportation (Non-Medical): No  Physical Activity: Insufficiently Active (01/20/2023)   Exercise Vital Sign    Days of Exercise per Week: 3 days    Minutes of Exercise per Session: 20 min  Stress: No Stress Concern Present (01/20/2023)   [employer] of Occupational Health - Occupational Stress Questionnaire    Feeling of Stress : Not at all  Social Connections: Socially Integrated (01/20/2023)   Social Connection and Isolation Panel [NHANES]    Frequency of Communication with Friends and Family: More than three times a week    Frequency of Social Gatherings with Friends and Family: Once a week    Attends Religious Services: More than 4 times per year    Active Member of [employer] or Organizations: Yes    Attends Engineer, structural: More than 4 times per year    Marital Status: Married  Catering manager Violence: Not on file   Family Status  Relation Name Status   Mother  Alive   Father  Alive    Neg Hx  (Not Specified)  No partnership data on file   Family History  Problem Relation Age of Onset   Hyperlipidemia Mother    Hypertension Mother    Depression Mother    Coronary artery disease Father    Diabetes Father    Heart disease Father    Hyperlipidemia Father    Stroke Father    Kidney disease Father    Colon cancer Neg Hx    Esophageal cancer Neg Hx    Colon polyps Neg Hx    Pancreatic cancer Neg Hx    Stomach cancer Neg Hx    Allergies  Allergen Reactions   Metformin And Related Diarrhea      Review of Systems  Constitutional:  Negative for chills, fever and  malaise/fatigue.  HENT:  Negative for congestion and hearing loss.   Eyes:  Negative for blurred vision and discharge.  Respiratory:  Negative for cough, sputum production and shortness of breath.   Cardiovascular:  Negative for chest pain, palpitations and leg swelling.  Gastrointestinal:  Negative for abdominal pain, blood in stool, constipation, diarrhea, heartburn, nausea and vomiting.  Genitourinary:  Negative for dysuria, frequency, hematuria and urgency.  Musculoskeletal:  Negative for back pain, falls and myalgias.  Skin:  Negative for rash.  Neurological:  Negative for dizziness, sensory change, loss of consciousness, weakness and headaches.  Endo/Heme/Allergies:  Negative for environmental allergies. Does not bruise/bleed easily.  Psychiatric/Behavioral:  Negative for depression and suicidal ideas. The patient is not nervous/anxious and does not have insomnia.       Objective:     BP 138/88 (BP Location: Right Arm, Patient Position: Sitting, Cuff Size: Large)   Pulse 66   Temp 98.6 F (37 C) (Oral)   Resp 18   Ht 5\' 2"  (1.575 m)   Wt 276 lb 9.6 oz (125.5 kg)   SpO2 96%   BMI 50.59 kg/m  BP Readings from Last 3 Encounters:  01/22/23 138/88  01/06/23 128/84  12/23/22 135/84   Wt Readings from Last 3 Encounters:  01/22/23 276 lb 9.6 oz (125.5 kg)  01/06/23 273 lb (123.8 kg)   12/23/22 271 lb (122.9 kg)   SpO2 Readings from Last 3 Encounters:  01/22/23 96%  01/06/23 96%  12/23/22 92%      Physical Exam Vitals and nursing note reviewed.  Constitutional:      General: She is not in acute distress.    Appearance: Normal appearance. She is well-developed.  HENT:     Head: Normocephalic and atraumatic.  Eyes:     General: No scleral icterus.       Right eye: No discharge.        Left eye: No discharge.  Cardiovascular:     Rate and Rhythm: Normal rate and regular rhythm.     Heart sounds: No murmur heard. Pulmonary:     Effort: Pulmonary effort is normal. No respiratory distress.     Breath sounds: Normal breath sounds.  Musculoskeletal:        General: Normal range of motion.     Cervical back: Normal range of motion and neck supple.     Right lower leg: No edema.     Left lower leg: No edema.  Skin:    General: Skin is warm and dry.  Neurological:     Mental Status: She is alert and oriented to person, place, and time.  Psychiatric:        Mood and Affect: Mood normal.        Behavior: Behavior normal.        Thought Content: Thought content normal.        Judgment: Judgment normal.      No results found for any visits on 01/22/23.  Last CBC Lab Results  Component Value Date   WBC 5.8 12/23/2022   HGB 12.5 12/23/2022   HCT 37.7 12/23/2022   MCV 90 12/23/2022   MCH 29.8 12/23/2022   RDW 13.1 12/23/2022   PLT 360 12/23/2022   Last metabolic panel Lab Results  Component Value Date   GLUCOSE 107 (H) 12/23/2022   NA 140 12/23/2022   K 4.1 12/23/2022   CL 100 12/23/2022   CO2 24 12/23/2022   BUN 15 12/23/2022   CREATININE 0.83  12/23/2022   EGFR 90 12/23/2022   CALCIUM 9.4 12/23/2022   PROT 7.5 12/23/2022   ALBUMIN 4.1 12/23/2022   LABGLOB 3.4 12/23/2022   AGRATIO 1.2 12/23/2022   BILITOT 0.3 12/23/2022   ALKPHOS 75 12/23/2022   AST 17 12/23/2022   ALT 22 12/23/2022   Last lipids Lab Results  Component Value Date    CHOL 218 (H) 12/23/2022   HDL 41 12/23/2022   LDLCALC 156 (H) 12/23/2022   TRIG 117 12/23/2022   CHOLHDL 6 07/24/2022   Last hemoglobin A1c Lab Results  Component Value Date   HGBA1C 5.7 (H) 12/23/2022   Last thyroid functions Lab Results  Component Value Date   TSH 1.520 12/23/2022   T3TOTAL 111 12/14/2017   Last vitamin D Lab Results  Component Value Date   VD25OH 36.1 12/23/2022   Last vitamin B12 and Folate Lab Results  Component Value Date   VITAMINB12 849 12/23/2022   FOLATE >20.0 12/14/2017      The 10-year ASCVD risk score (Arnett DK, et al., 2019) is: 12.4%    Assessment & Plan:   Problem List Items Addressed This Visit       Unprioritized   Tinea pedis of both feet   Relevant Medications   nystatin-triamcinolone ointment (MYCOLOG)   Essential hypertension - Primary    Well controlled, no changes to meds. Encouraged heart healthy diet such as the DASH diet and exercise as tolerated.        Bilateral lower extremity edema   Relevant Medications   NONFORMULARY OR COMPOUNDED ITEM    No follow-ups on file.    Donato Schultz, DO

## 2023-01-22 NOTE — Assessment & Plan Note (Signed)
Well controlled, no changes to meds. Encouraged heart healthy diet such as the DASH diet and exercise as tolerated.  °

## 2023-02-03 ENCOUNTER — Ambulatory Visit (INDEPENDENT_AMBULATORY_CARE_PROVIDER_SITE_OTHER): Payer: 59 | Admitting: Internal Medicine

## 2023-02-03 ENCOUNTER — Encounter (INDEPENDENT_AMBULATORY_CARE_PROVIDER_SITE_OTHER): Payer: Self-pay | Admitting: Internal Medicine

## 2023-02-03 VITALS — BP 130/78 | HR 62 | Temp 98.3°F | Ht 62.0 in | Wt 276.0 lb

## 2023-02-03 DIAGNOSIS — R7303 Prediabetes: Secondary | ICD-10-CM | POA: Diagnosis not present

## 2023-02-03 DIAGNOSIS — I10 Essential (primary) hypertension: Secondary | ICD-10-CM

## 2023-02-03 DIAGNOSIS — Z6841 Body Mass Index (BMI) 40.0 and over, adult: Secondary | ICD-10-CM | POA: Diagnosis not present

## 2023-02-03 NOTE — Assessment & Plan Note (Signed)
Liliana has been working hard implementing lifestyle changes she is somewhat frustrated by the lack of progress and effort perceived and at times is feeling discouraged.  She has a normal metabolism and has hyperinsulinism and is also on obesogenic medications.  There is also a genetic component.  We will work on reducing calories further to 1300 cal a day with about 30% of calories coming from protein.  If she has problems with orixegenic signaling she may need antiobesity medication to maintain reduced calorie state.

## 2023-02-03 NOTE — Assessment & Plan Note (Signed)
Most recent A1c is  Lab Results  Component Value Date   HGBA1C 5.7 (H) 12/23/2022   with associated elevated insulin levels.  Patient informed of disease state and risk of progression. This may contribute to abnormal cravings, fatigue and diabetes complications without having diabetes.   Patient working on reducing simple carbohydrates from diet.  She has hyperinsulinemia which may contribute to weight gain.  Her antiretrovirals may also contribute to this.  She did not tolerate metformin for pharmacoprophylaxis.  I feel she benefits from incretin therapy but she is somewhat hesitant about taking a medication long-term but is willing to consider at the next office visit.

## 2023-02-03 NOTE — Progress Notes (Signed)
Office: [phone]  /  Fax: [phone]  WEIGHT SUMMARY AND BIOMETRICS  Vitals Temp: 98.3 F (36.8 C) BP: 130/78 Pulse Rate: 62 SpO2: 97 %   Anthropometric Measurements Height: 5\' 2"  (1.575 m) Weight: 276 lb (125.2 kg) BMI (Calculated): 50.47 Weight at Last Visit: 273 lb Weight Lost Since Last Visit: 0 lb Weight Gained Since Last Visit: 3 lb Starting Weight: 276 lb Total Weight Loss (lbs): 0 lb (0 kg) Peak Weight: 278 lb   Body Composition  Body Fat %: 48.6 % Fat Mass (lbs): 134.6 lbs Muscle Mass (lbs): 135 lbs Total Body Water (lbs): 104.8 lbs Visceral Fat Rating : 17    No data recorded Today's Visit #: 9  Starting Date: 04/24/22   HPI  Chief Complaint: OBESITY  Autumn Curry is here to discuss her progress with her obesity treatment plan. She is on the the Category 3 Plan and states she is following her eating plan approximately 80 % of the time. She states she is exercising 20-45 minutes 4 times per week.  Interval History:  Since last office visit she has gained 3 lbs. She is somewhat frustrated due to perceived efforts and results. BIA suggest fluid retention and gain in muscle mass.  She reports good adherence to reduced calorie nutritional plan. She has been working on not skipping meals, increasing protein intake at every meal, drinking more water, making healthier choices, begun to exercise, eating out less, and working on meal prepping  Orixegenic Control: Denies problems with appetite and hunger signals.  Denies problems with satiety and satiation.  Denies problems with eating patterns and portion control.  Reports abnormal cravings. Denies feeling deprived or restricted.   Barriers identified: presence of obesogenic drugs.   Pharmacotherapy for weight loss: She is currently taking no anti-obesity medication.    ASSESSMENT AND PLAN  TREATMENT PLAN FOR OBESITY:  Recommended Dietary Goals  [name] is currently in the action stage of  change. As such, her goal is to continue weight management plan. She has agreed to: switch to Cat 2 + 100 calories to reduce calories further.  Behavioral Intervention  We discussed the following Behavioral Modification Strategies today: increasing lean protein intake, decreasing simple carbohydrates , increasing vegetables, increasing lower glycemic fruits, increasing fiber rich foods, increasing water intake, work on tracking and journaling calories using tracking application, reading food labels , continue to practice mindfulness when eating, and planning for success.  Additional resources provided today: None  Recommended Physical Activity Goals  [name] has been advised to work up to 150 minutes of moderate intensity aerobic activity a week and strengthening exercises 2-3 times per week for cardiovascular health, weight loss maintenance and preservation of muscle mass.   She has agreed to :  Think about ways to increase daily physical activity and overcoming barriers to exercise  Pharmacotherapy We discussed various medication options to help [name] with her weight loss efforts and we both agreed to :  to explore opportunities at the next OV.  ASSOCIATED CONDITIONS ADDRESSED TODAY  Essential hypertension Assessment & Plan: Blood pressure at goal for age and risk category.  On atenolol and hydrochlorothiazide.  Atenolol may cause weight gain.  Most recent GFR was 90.    Continue with weight loss therapy.  Monitor for symptoms of orthostasis while losing weight. Continue current regimen and home monitoring for a goal blood pressure of 120/80.    Pre-diabetes Assessment & Plan: Most recent A1c is  Lab Results  Component Value Date   HGBA1C  5.7 (H) 12/23/2022   with associated elevated insulin levels.  Patient informed of disease state and risk of progression. This may contribute to abnormal cravings, fatigue and diabetes complications without having diabetes.   Patient working  on reducing simple carbohydrates from diet.  She has hyperinsulinemia which may contribute to weight gain.  Her antiretrovirals may also contribute to this.  She did not tolerate metformin for pharmacoprophylaxis.  I feel she benefits from incretin therapy but she is somewhat hesitant about taking a medication long-term but is willing to consider at the next office visit.    Class 3 severe obesity with serious comorbidity and body mass index (BMI) of 50.0 to 59.9 in adult, unspecified obesity type [facility]) Assessment & Plan: [name] has been working hard implementing lifestyle changes she is somewhat frustrated by the lack of progress and effort perceived and at times is feeling discouraged.  She has a normal metabolism and has hyperinsulinism and is also on obesogenic medications.  There is also a genetic component.  We will work on reducing calories further to 1300 cal a day with about 30% of calories coming from protein.  If she has problems with orixegenic signaling she may need antiobesity medication to maintain reduced calorie state.     PHYSICAL EXAM:  Blood pressure 130/78, pulse 62, temperature 98.3 F (36.8 C), height 5\' 2"  (1.575 m), weight 276 lb (125.2 kg), last menstrual period 11/17/2022, SpO2 97%. Body mass index is 50.48 kg/m.  General: She is overweight, cooperative, alert, well developed, and in no acute distress. PSYCH: Has normal mood, affect and thought process.   HEENT: EOMI, sclerae are anicteric. Lungs: Normal breathing effort, no conversational dyspnea. Extremities: No edema.  Neurologic: No gross sensory or motor deficits. No tremors or fasciculations noted.    DIAGNOSTIC DATA REVIEWED:  BMET    Component Value Date/Time   NA 140 12/23/2022 0909   K 4.1 12/23/2022 0909   CL 100 12/23/2022 0909   CO2 24 12/23/2022 0909   GLUCOSE 107 (H) 12/23/2022 0909   GLUCOSE 88 07/24/2022 1537   BUN 15 12/23/2022 0909   CREATININE 0.83 12/23/2022 0909   CREATININE 0.86  06/24/2022 1514   CALCIUM 9.4 12/23/2022 0909   GFRNONAA 99 03/15/2020 0758   GFRNONAA 78 04/26/2019 1429   GFRAA 114 03/15/2020 0758   GFRAA 91 04/26/2019 1429   Lab Results  Component Value Date   HGBA1C 5.7 (H) 12/23/2022   HGBA1C 5.6 05/25/2017   Lab Results  Component Value Date   INSULIN 46.7 (H) 12/23/2022   INSULIN 31.4 (H) 12/14/2017   Lab Results  Component Value Date   TSH 1.520 12/23/2022   CBC    Component Value Date/Time   WBC 5.8 12/23/2022 0909   WBC 7.1 07/24/2022 1537   RBC 4.19 12/23/2022 0909   RBC 4.03 07/24/2022 1537   HGB 12.5 12/23/2022 0909   HCT 37.7 12/23/2022 0909   PLT 360 12/23/2022 0909   MCV 90 12/23/2022 0909   MCH 29.8 12/23/2022 0909   MCH 30.6 06/24/2022 1514   MCHC 33.2 12/23/2022 0909   MCHC 33.1 07/24/2022 1537   RDW 13.1 12/23/2022 0909   Iron Studies No results found for: "IRON", "TIBC", "FERRITIN", "IRONPCTSAT" Lipid Panel     Component Value Date/Time   CHOL 218 (H) 12/23/2022 0909   TRIG 117 12/23/2022 0909   HDL 41 12/23/2022 0909   CHOLHDL 6 07/24/2022 1537   VLDL 39.6 07/24/2022 1537   LDLCALC 156 (H)  12/23/2022 0909   LDLCALC 147 (H) 06/24/2022 1514   Hepatic Function Panel     Component Value Date/Time   PROT 7.5 12/23/2022 0909   ALBUMIN 4.1 12/23/2022 0909   AST 17 12/23/2022 0909   ALT 22 12/23/2022 0909   ALKPHOS 75 12/23/2022 0909   BILITOT 0.3 12/23/2022 0909      Component Value Date/Time   TSH 1.520 12/23/2022 0909   Nutritional Lab Results  Component Value Date   VD25OH 36.1 12/23/2022   VD25OH 21.76 (L) 07/24/2022   VD25OH 31.2 05/08/2022     Return in about 2 weeks (around 02/17/2023) for For Weight Mangement with Dr. Rikki Spearing.[name] She was informed of the importance of frequent follow up visits to maximize her success with intensive lifestyle modifications for her multiple health conditions.   ATTESTASTION STATEMENTS:  Reviewed by clinician on day of visit: allergies, medications,  problem list, medical history, surgical history, family history, social history, and previous encounter notes.     Worthy Rancher, MD

## 2023-02-03 NOTE — Assessment & Plan Note (Signed)
Blood pressure at goal for age and risk category.  On atenolol and hydrochlorothiazide.  Atenolol may cause weight gain.  Most recent GFR was 90.    Continue with weight loss therapy.  Monitor for symptoms of orthostasis while losing weight. Continue current regimen and home monitoring for a goal blood pressure of 120/80.

## 2023-02-05 ENCOUNTER — Other Ambulatory Visit: Payer: Self-pay | Admitting: Family Medicine

## 2023-02-05 DIAGNOSIS — B353 Tinea pedis: Secondary | ICD-10-CM

## 2023-02-10 ENCOUNTER — Other Ambulatory Visit: Payer: Self-pay | Admitting: Family Medicine

## 2023-02-10 DIAGNOSIS — I10 Essential (primary) hypertension: Secondary | ICD-10-CM

## 2023-02-17 ENCOUNTER — Telehealth (INDEPENDENT_AMBULATORY_CARE_PROVIDER_SITE_OTHER): Payer: 59 | Admitting: Psychology

## 2023-02-17 DIAGNOSIS — F5089 Other specified eating disorder: Secondary | ICD-10-CM

## 2023-02-17 NOTE — Progress Notes (Signed)
  Office: [phone]  /  Fax: [phone]    Date: February 17, 2023  Appointment Start Time: 10:01am Duration: 26 minutes Provider: Lawerance Cruel, Psy.D. Type of Session: Individual Therapy  Location of Patient: Home (private location) Location of Provider: Provider's Home (private office) Type of Contact: Telepsychological Visit via MyChart Video Visit  Session Content: Omni is a 42 y.o. female presenting for a follow-up appointment to address the previously established treatment goal of increasing coping skills.Today's appointment was a telepsychological visit. Ashyah provided verbal consent for today's telepsychological appointment and she is aware she is responsible for securing confidentiality on her end of the session. Prior to proceeding with today's appointment, Mattie's physical location at the time of this appointment was obtained as well a phone number she could be reached at in the event of technical difficulties. Hailley and this provider participated in today's telepsychological service.   This provider conducted a brief check-in. Emalea shared about her trip to [place], adding she made better choices and engaged in portion control. Since returning, her eating habits have been "shaky." Further explored and processed. Psychoeducation provided regarding all or nothing thinking. 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 As such, she was engaged in problem solving to help her make better choice/engage in portion control. She agreed to implement the following: switch dinner and lunch options on days she is going to church; make hard boiled eggs in bulk for breakfast and possible snack; utilize eating out handout provided by Dr. Rikki Spearing; and be prepared with snacks congruent to structured meal plan. Overall, Verenice was receptive to today's appointment as evidenced by openness to sharing, responsiveness to feedback, and  willingness to implement discussed strategies .  Mental Status Examination:  Appearance: neat Behavior: appropriate to circumstances Mood: neutral Affect: mood congruent Speech: WNL Eye Contact: appropriate Psychomotor Activity: WNL Gait: unable to assess Thought Process: linear, logical, and goal directed and no evidence or endorsement of suicidal, homicidal, and self-harm ideation, plan and intent  Thought Content/Perception: no hallucinations, delusions, bizarre thinking or behavior endorsed or observed Orientation: AAOx4 Memory/Concentration: intact Insight: fair Judgment: fair  Interventions:  Provided empathic reflections and validation Employed supportive psychotherapy interventions to facilitate reduced distress and to improve coping skills with identified stressors Engaged patient in problem solving Psychoeducation provided regarding all-or-nothing thinking  DSM-5 Diagnosis(es): F50.89 Other Specified Feeding or Eating Disorder, Emotional Eating Behaviors  Treatment Goal & Progress: During the initial appointment with this provider, the following treatment goal was established: increase coping skills. Progress is limited, as Kathleena has just begun treatment with this provider; however, she is receptive to the interaction and interventions and rapport is being established.   Plan: The next appointment is scheduled for 03/17/2023 at 4pm, which will be via MyChart Video Visit. The next session will focus on working towards the established treatment goal.

## 2023-02-28 ENCOUNTER — Other Ambulatory Visit: Payer: Self-pay | Admitting: Family Medicine

## 2023-02-28 DIAGNOSIS — I10 Essential (primary) hypertension: Secondary | ICD-10-CM

## 2023-03-03 ENCOUNTER — Ambulatory Visit (INDEPENDENT_AMBULATORY_CARE_PROVIDER_SITE_OTHER): Payer: 59 | Admitting: Internal Medicine

## 2023-03-03 ENCOUNTER — Other Ambulatory Visit: Payer: Self-pay | Admitting: Family Medicine

## 2023-03-03 DIAGNOSIS — B353 Tinea pedis: Secondary | ICD-10-CM

## 2023-03-04 MED ORDER — NYSTATIN-TRIAMCINOLONE 100000-0.1 UNIT/GM-% EX OINT
TOPICAL_OINTMENT | Freq: Two times a day (BID) | CUTANEOUS | 0 refills | Status: AC
Start: 2023-03-04 — End: ?

## 2023-03-17 ENCOUNTER — Telehealth (INDEPENDENT_AMBULATORY_CARE_PROVIDER_SITE_OTHER): Payer: Self-pay | Admitting: Psychology

## 2023-03-17 ENCOUNTER — Encounter (INDEPENDENT_AMBULATORY_CARE_PROVIDER_SITE_OTHER): Payer: Self-pay

## 2023-03-17 ENCOUNTER — Encounter (INDEPENDENT_AMBULATORY_CARE_PROVIDER_SITE_OTHER): Payer: No Typology Code available for payment source | Admitting: Psychology

## 2023-03-17 NOTE — Telephone Encounter (Signed)
  Office: [phone]  /  Fax: [phone]  Date of Encounter: March 17, 2023  Time of Encounter: 4:02pm Duration of Encounter: ~2 minute(s) Provider: Lawerance Cruel, PsyD  CONTENT:  Autumn Curry presented for today's appointment via MyChart Video Visit. She was driving and noted she would be unable to pull over, as her co-worker was also in the car. Due to safety concerns and lack of confidentiality, she agreed to call back and reschedule today's appointment. No evidence or endorsement of safety concerns. All questions/concerns addressed.   PLAN: Yamileth will call the clinic to reschedule today's appointment.

## 2023-03-17 NOTE — Progress Notes (Signed)
Entered in error

## 2023-04-06 ENCOUNTER — Encounter (INDEPENDENT_AMBULATORY_CARE_PROVIDER_SITE_OTHER): Payer: Self-pay

## 2023-04-06 ENCOUNTER — Telehealth (INDEPENDENT_AMBULATORY_CARE_PROVIDER_SITE_OTHER): Payer: Self-pay | Admitting: Psychology

## 2023-04-06 NOTE — Telephone Encounter (Signed)
9/23 attempted to call Autumn Curry to reschedule her visit with Dr. Dewaine Conger, no answer, voicemail full. Sent pt my chart note.

## 2023-04-28 ENCOUNTER — Ambulatory Visit (INDEPENDENT_AMBULATORY_CARE_PROVIDER_SITE_OTHER): Payer: No Typology Code available for payment source | Admitting: Internal Medicine

## 2023-04-28 ENCOUNTER — Other Ambulatory Visit (HOSPITAL_COMMUNITY): Payer: Self-pay

## 2023-04-28 ENCOUNTER — Encounter (INDEPENDENT_AMBULATORY_CARE_PROVIDER_SITE_OTHER): Payer: Self-pay | Admitting: Internal Medicine

## 2023-04-28 VITALS — BP 131/84 | HR 68 | Temp 97.9°F | Ht 62.0 in | Wt 278.0 lb

## 2023-04-28 DIAGNOSIS — Z6841 Body Mass Index (BMI) 40.0 and over, adult: Secondary | ICD-10-CM

## 2023-04-28 DIAGNOSIS — R7303 Prediabetes: Secondary | ICD-10-CM

## 2023-04-28 DIAGNOSIS — R638 Other symptoms and signs concerning food and fluid intake: Secondary | ICD-10-CM | POA: Diagnosis not present

## 2023-04-28 MED ORDER — ZEPBOUND 2.5 MG/0.5ML ~~LOC~~ SOAJ
2.5000 mg | SUBCUTANEOUS | 0 refills | Status: DC
Start: 2023-04-28 — End: 2023-05-19
  Filled 2023-04-28: qty 2, 28d supply, fill #0

## 2023-04-28 NOTE — Progress Notes (Unsigned)
Office: [phone]  /  Fax: [phone]  WEIGHT SUMMARY AND BIOMETRICS  Vitals Temp: 97.9 F (36.6 C) BP: 131/84 Pulse Rate: 68   Anthropometric Measurements Height: 5\' 2"  (1.575 m) Weight: 278 lb (126.1 kg) BMI (Calculated): 50.83 Weight at Last Visit: 276 lb Weight Lost Since Last Visit: 0 lb Weight Gained Since Last Visit: 2 lb Starting Weight: 278 lb Total Weight Loss (lbs): 0 lb (0 kg) Peak Weight: 278 lb   Body Composition  Body Fat %: 54.6 % Fat Mass (lbs): 152.2 lbs Muscle Mass (lbs): 120 lbs Total Body Water (lbs): 0 lbs Visceral Fat Rating : 19    No data recorded Today's Visit #: 11  Starting Date: 04/24/22   HPI  Chief Complaint: OBESITY   Interval History:  Since last office visit she has gained 2 pounds. She reports has not implemented reduced calorie nutritional plan Patient reports difficulty maintaining a reduced calorie state and feels she is lacking motivation.  Her family has not been supportive of her efforts.   Orexigenic Control: Reports problems with appetite and hunger signals.  Denies problems with satiety and satiation.  Denies problems with eating patterns and portion control.  Denies abnormal cravings. Denies feeling deprived or restricted.   Barriers identified: lack of partner or family support, having difficulty with meal prep and planning, and having difficulty focusing on healthy eating.   Pharmacotherapy for weight loss: She is currently taking no anti-obesity medication.    ASSESSMENT AND PLAN  TREATMENT PLAN FOR OBESITY:  Recommended Dietary Goals  Autumn Curry is currently in the action stage of change. As such, her goal is to continue weight management plan. She has agreed to: continue to work on implementation of reduced calorie nutrition plan (RCNP)  Behavioral Intervention  We discussed the following Behavioral Modification Strategies today: continue to work on maintaining a reduced calorie state,  getting the recommended amount of protein, incorporating whole foods, making healthy choices, staying well hydrated and practicing mindfulness when eating..  Additional resources provided today: None  Recommended Physical Activity Goals  Autumn Curry has been advised to work up to 150 minutes of moderate intensity aerobic activity a week and strengthening exercises 2-3 times per week for cardiovascular health, weight loss maintenance and preservation of muscle mass.   She has agreed to :  Think about enjoyable ways to increase daily physical activity and overcoming barriers to exercise and Increase physical activity in their day and reduce sedentary time (increase NEAT).  Pharmacotherapy We discussed various medication options to help Autumn Curry with her weight loss efforts and we both agreed to : start anti-obesity medication.  In addition to reduced calorie nutrition plan (RCNP), behavioral strategies and physical activity, Unknown would benefit from pharmacotherapy to assist with hunger signals, satiety and cravings. This will reduce obesity-related health risks by inducing weight loss, and help reduce food consumption and adherence to [facility]) . It may also improve QOL by improving self-confidence and reduce the  setbacks associated with metabolic adaptations.  After discussion of treatment options, mechanisms of action, benefits, side effects, contraindications and shared decision making she is agreeable to starting Zepbound 2.5 mg once a week. Patient also made aware that medication is indicated for long-term management of obesity and the risk of weight regain following discontinuation of treatment and hence the importance of adhering to medical weight loss plan.  We demonstrated use of device and patient using teach back method was able to demonstrate proper technique.  ASSOCIATED CONDITIONS ADDRESSED TODAY  Pre-diabetes Assessment &  Plan: Most recent A1c is  Lab Results  Component Value Date    HGBA1C 5.7 (H) 12/23/2022   with associated elevated insulin levels.  Patient informed of disease state and risk of progression. This may contribute to abnormal cravings, fatigue and diabetes complications without having diabetes.   After discussion of benefits and side effects he will be started on Zepbound for pharmacoprophylaxis and weight management    Morbid obesity (HCC): Starting BMI 50.5 -     Zepbound; Inject 2.5 mg into the skin once a week.  Dispense: 2 mL; Refill: 0  Abnormal food appetite Assessment & Plan: She has increased orexigenic signaling, impaired satiety and inhibitory control. This is secondary to an abnormal energy regulation system and pathological neurohormonal pathways characteristic of excess adiposity.  In addition to nutritional and behavioral strategies she benefits from pharmacotherapy.       PHYSICAL EXAM:  Blood pressure 131/84, pulse 68, temperature 97.9 F (36.6 C), height 5\' 2"  (1.575 m), weight 278 lb (126.1 kg), last menstrual period 02/10/2023. Body mass index is 50.85 kg/m.  General: She is overweight, cooperative, alert, well developed, and in no acute distress. PSYCH: Has normal mood, affect and thought process.   HEENT: EOMI, sclerae are anicteric. Lungs: Normal breathing effort, no conversational dyspnea. Extremities: No edema.  Neurologic: No gross sensory or motor deficits. No tremors or fasciculations noted.    DIAGNOSTIC DATA REVIEWED:  BMET    Component Value Date/Time   NA 140 12/23/2022 0909   K 4.1 12/23/2022 0909   CL 100 12/23/2022 0909   CO2 24 12/23/2022 0909   GLUCOSE 107 (H) 12/23/2022 0909   GLUCOSE 88 07/24/2022 1537   BUN 15 12/23/2022 0909   CREATININE 0.83 12/23/2022 0909   CREATININE 0.86 06/24/2022 1514   CALCIUM 9.4 12/23/2022 0909   GFRNONAA 99 03/15/2020 0758   GFRNONAA 78 04/26/2019 1429   GFRAA 114 03/15/2020 0758   GFRAA 91 04/26/2019 1429   Lab Results  Component Value Date   HGBA1C 5.7 (H)  12/23/2022   HGBA1C 5.6 05/25/2017   Lab Results  Component Value Date   INSULIN 46.7 (H) 12/23/2022   INSULIN 31.4 (H) 12/14/2017   Lab Results  Component Value Date   TSH 1.520 12/23/2022   CBC    Component Value Date/Time   WBC 5.8 12/23/2022 0909   WBC 7.1 07/24/2022 1537   RBC 4.19 12/23/2022 0909   RBC 4.03 07/24/2022 1537   HGB 12.5 12/23/2022 0909   HCT 37.7 12/23/2022 0909   PLT 360 12/23/2022 0909   MCV 90 12/23/2022 0909   MCH 29.8 12/23/2022 0909   MCH 30.6 06/24/2022 1514   MCHC 33.2 12/23/2022 0909   MCHC 33.1 07/24/2022 1537   RDW 13.1 12/23/2022 0909   Iron Studies No results found for: "IRON", "TIBC", "FERRITIN", "IRONPCTSAT" Lipid Panel     Component Value Date/Time   CHOL 218 (H) 12/23/2022 0909   TRIG 117 12/23/2022 0909   HDL 41 12/23/2022 0909   CHOLHDL 6 07/24/2022 1537   VLDL 39.6 07/24/2022 1537   LDLCALC 156 (H) 12/23/2022 0909   LDLCALC 147 (H) 06/24/2022 1514   Hepatic Function Panel     Component Value Date/Time   PROT 7.5 12/23/2022 0909   ALBUMIN 4.1 12/23/2022 0909   AST 17 12/23/2022 0909   ALT 22 12/23/2022 0909   ALKPHOS 75 12/23/2022 0909   BILITOT 0.3 12/23/2022 0909      Component Value Date/Time   TSH  1.520 12/23/2022 0909   Nutritional Lab Results  Component Value Date   VD25OH 36.1 12/23/2022   VD25OH 21.76 (L) 07/24/2022   VD25OH 31.2 05/08/2022     Return in about 3 weeks (around 05/19/2023) for For Weight Mangement with Dr. Rikki Spearing.[name] She was informed of the importance of frequent follow up visits to maximize her success with intensive lifestyle modifications for her multiple health conditions.   ATTESTASTION STATEMENTS:  Reviewed by clinician on day of visit: allergies, medications, problem list, medical history, surgical history, family history, social history, and previous encounter notes.     Worthy Rancher, MD

## 2023-04-29 ENCOUNTER — Other Ambulatory Visit (HOSPITAL_COMMUNITY): Payer: Self-pay

## 2023-04-29 ENCOUNTER — Encounter (INDEPENDENT_AMBULATORY_CARE_PROVIDER_SITE_OTHER): Payer: Self-pay | Admitting: Internal Medicine

## 2023-04-29 DIAGNOSIS — R638 Other symptoms and signs concerning food and fluid intake: Secondary | ICD-10-CM | POA: Insufficient documentation

## 2023-04-29 NOTE — Assessment & Plan Note (Signed)
She has increased orexigenic signaling, impaired satiety and inhibitory control. This is secondary to an abnormal energy regulation system and pathological neurohormonal pathways characteristic of excess adiposity.  In addition to nutritional and behavioral strategies she benefits from pharmacotherapy.

## 2023-04-29 NOTE — Assessment & Plan Note (Signed)
See obesity treatment plan

## 2023-04-29 NOTE — Assessment & Plan Note (Signed)
Most recent A1c is  Lab Results  Component Value Date   HGBA1C 5.7 (H) 12/23/2022   with associated elevated insulin levels.  Patient informed of disease state and risk of progression. This may contribute to abnormal cravings, fatigue and diabetes complications without having diabetes.   After discussion of benefits and side effects he will be started on Zepbound for pharmacoprophylaxis and weight management

## 2023-04-30 ENCOUNTER — Telehealth (INDEPENDENT_AMBULATORY_CARE_PROVIDER_SITE_OTHER): Payer: Self-pay

## 2023-05-04 NOTE — Telephone Encounter (Signed)
Dear Autumn Curry: [facility] [employer] received a request for coverage of Zepbound 2.5MG /0.5ML [place] SO[facility] for you. Your request was denied based on the terms of your prescription benefit plan. This is the initial adverse coverage determination for this request. The reason for the denial was: *Drug Not Covered/Plan Exclusion - Your request for coverage was denied because your prescription benefit plan does not cover the requested medication. This decision relates specifically to coverage provided under your prescription benefit plan and does not involve any determination of medical judgment.

## 2023-05-19 ENCOUNTER — Encounter (INDEPENDENT_AMBULATORY_CARE_PROVIDER_SITE_OTHER): Payer: Self-pay | Admitting: Internal Medicine

## 2023-05-19 ENCOUNTER — Ambulatory Visit (INDEPENDENT_AMBULATORY_CARE_PROVIDER_SITE_OTHER): Payer: No Typology Code available for payment source | Admitting: Internal Medicine

## 2023-05-19 DIAGNOSIS — Z6841 Body Mass Index (BMI) 40.0 and over, adult: Secondary | ICD-10-CM

## 2023-05-19 DIAGNOSIS — E161 Other hypoglycemia: Secondary | ICD-10-CM | POA: Diagnosis not present

## 2023-05-19 DIAGNOSIS — B20 Human immunodeficiency virus [HIV] disease: Secondary | ICD-10-CM

## 2023-05-19 MED ORDER — TOPIRAMATE 25 MG PO TABS
25.0000 mg | ORAL_TABLET | Freq: Every day | ORAL | 0 refills | Status: DC
Start: 2023-05-19 — End: 2023-07-23

## 2023-05-19 NOTE — Assessment & Plan Note (Signed)
Patient is on Symtuza.  She reports having an undetectable viral load and stable CD4 count.  She has been started on topiramate.  We reviewed for medication interactions and it seems that high dosages of topiramate may affect medication levels we do not plan to exceed 100 mg/day.

## 2023-05-19 NOTE — Progress Notes (Signed)
Office: [phone]  /  Fax: [phone]  WEIGHT SUMMARY AND BIOMETRICS  Vitals Temp: 98 F (36.7 C) BP: 136/86 Pulse Rate: 80 SpO2: 98 %   Anthropometric Measurements Height: 5\' 2"  (1.575 m) Weight: 275 lb (124.7 kg) BMI (Calculated): 50.29 Weight at Last Visit: 278 lb Weight Lost Since Last Visit: 3 lb Weight Gained Since Last Visit: 0 lb Starting Weight: 278 lb Total Weight Loss (lbs): 3 lb (1.361 kg) Peak Weight: 278 lb   Body Composition  Body Fat %: 54.1 % Fat Mass (lbs): 149 lbs Muscle Mass (lbs): 120.2 lbs Total Body Water (lbs): 0 lbs Visceral Fat Rating : 19    No data recorded Today's Visit #: 12  Starting Date: 04/24/22   HPI  Chief Complaint: OBESITY  Autumn Curry is here to discuss her progress with her obesity treatment plan. She is on the the Category 2 Plan + 100 and states she is following her eating plan approximately 50 % of the time. She states she is not exercising.  Interval History:  Since last office visit she has lost 3 pounds. She reports variable adherence to reduced calorie nutritional plan She has been working on not skipping meals, increasing protein intake at every meal, drinking more water, and making healthier choices  Last office visit we had prescribed Zepbound for weight management but medication is not covered by her insurance.  Orexigenic Control: Reports problems with appetite and hunger signals.  Reports problems with satiety and satiation.  Denies problems with eating patterns and portion control.  Denies abnormal cravings. Denies feeling deprived or restricted.   Barriers identified: strong hunger signals and impaired satiety / inhibitory control, low volume of physical activity at present , medical comorbidities, and presence of obesogenic drugs.   Pharmacotherapy for weight loss: She is currently taking no anti-obesity medication and Zepbound was prescribed but not covered by her insurance .    ASSESSMENT  AND PLAN  TREATMENT PLAN FOR OBESITY:  Recommended Dietary Goals  Iyanni is currently in the action stage of change. As such, her goal is to continue weight management plan. She has agreed to: continue to work on implementation of reduced calorie nutrition plan (RCNP)  Behavioral Intervention  We discussed the following Behavioral Modification Strategies today: continue to work on maintaining a reduced calorie state, getting the recommended amount of protein, incorporating whole foods, making healthy choices, staying well hydrated and practicing mindfulness when eating..  Additional resources provided today: None  Recommended Physical Activity Goals  Autumn Curry has been advised to work up to 150 minutes of moderate intensity aerobic activity a week and strengthening exercises 2-3 times per week for cardiovascular health, weight loss maintenance and preservation of muscle mass.   She has agreed to :  Think about enjoyable ways to increase daily physical activity and overcoming barriers to exercise and Increase physical activity in their day and reduce sedentary time (increase NEAT).  Pharmacotherapy We discussed various medication options to help Autumn Curry with her weight loss efforts and we both agreed to : start anti-obesity medication.  In addition to reduced calorie nutrition plan (RCNP), behavioral strategies and physical activity, Autumn Curry would benefit from pharmacotherapy to assist with hunger signals, satiety and cravings. This will reduce obesity-related health risks by inducing weight loss, and help reduce food consumption and adherence to [facility]) . It may also improve QOL by improving self-confidence and reduce the  setbacks associated with metabolic adaptations.  After discussion of treatment options, mechanisms of action, benefits, side effects, contraindications  and shared decision making she is agreeable to starting topiramate 25 mg in the evening.   We discussed medication  teratogenicity.  She is not sexually active and was made aware that if she does become sexually active she needs reliable means of birth control to remain on this medication.  ASSOCIATED CONDITIONS ADDRESSED TODAY  Morbid obesity [facility]) Assessment & Plan: Patient has multiple obstacles which she is trying to overcome to improve her overall health.  She will work on increasing the number of days where she is eating balanced meals.  We also discussed the importance of increasing volume of physical activity which will also help her with insulin resistance.  See obesity treatment plan  Orders: -     Topiramate; Take 1 tablet (25 mg total) by mouth daily.  Dispense: 30 tablet; Refill: 0  Human immunodeficiency virus (HIV) disease (HCC) Assessment & Plan: Patient is on Symtuza.  She reports having an undetectable viral load and stable CD4 count.  She has been started on topiramate.  We reviewed for medication interactions and it seems that high dosages of topiramate may affect medication levels we do not plan to exceed 100 mg/day.   Hyperinsulinemia Assessment & Plan: Her insurance did not cover Zepbound.  She also did not tolerate metformin.  Her insurance is switching in January.  I feel that she would benefit from incretin therapy as she has prediabetes and also hyperinsulinemia.  She is therefore at risk for progression cardiovascular complications in the future.  Her HIV medications also increase the risk of metabolic derangements.     PHYSICAL EXAM:  Blood pressure 136/86, pulse 80, temperature 98 F (36.7 C), height 5\' 2"  (1.575 m), weight 275 lb (124.7 kg), last menstrual period 05/13/2023, SpO2 98%. Body mass index is 50.3 kg/m.  General: She is overweight, cooperative, alert, well developed, and in no acute distress. PSYCH: Has normal mood, affect and thought process.   HEENT: EOMI, sclerae are anicteric. Lungs: Normal breathing effort, no conversational dyspnea. Extremities:  No edema.  Neurologic: No gross sensory or motor deficits. No tremors or fasciculations noted.    DIAGNOSTIC DATA REVIEWED:  BMET    Component Value Date/Time   NA 140 12/23/2022 0909   K 4.1 12/23/2022 0909   CL 100 12/23/2022 0909   CO2 24 12/23/2022 0909   GLUCOSE 107 (H) 12/23/2022 0909   GLUCOSE 88 07/24/2022 1537   BUN 15 12/23/2022 0909   CREATININE 0.83 12/23/2022 0909   CREATININE 0.86 06/24/2022 1514   CALCIUM 9.4 12/23/2022 0909   GFRNONAA 99 03/15/2020 0758   GFRNONAA 78 04/26/2019 1429   GFRAA 114 03/15/2020 0758   GFRAA 91 04/26/2019 1429   Lab Results  Component Value Date   HGBA1C 5.7 (H) 12/23/2022   HGBA1C 5.6 05/25/2017   Lab Results  Component Value Date   INSULIN 46.7 (H) 12/23/2022   INSULIN 31.4 (H) 12/14/2017   Lab Results  Component Value Date   TSH 1.520 12/23/2022   CBC    Component Value Date/Time   WBC 5.8 12/23/2022 0909   WBC 7.1 07/24/2022 1537   RBC 4.19 12/23/2022 0909   RBC 4.03 07/24/2022 1537   HGB 12.5 12/23/2022 0909   HCT 37.7 12/23/2022 0909   PLT 360 12/23/2022 0909   MCV 90 12/23/2022 0909   MCH 29.8 12/23/2022 0909   MCH 30.6 06/24/2022 1514   MCHC 33.2 12/23/2022 0909   MCHC 33.1 07/24/2022 1537   RDW 13.1 12/23/2022 0909  Iron Studies No results found for: "IRON", "TIBC", "FERRITIN", "IRONPCTSAT" Lipid Panel     Component Value Date/Time   CHOL 218 (H) 12/23/2022 0909   TRIG 117 12/23/2022 0909   HDL 41 12/23/2022 0909   CHOLHDL 6 07/24/2022 1537   VLDL 39.6 07/24/2022 1537   LDLCALC 156 (H) 12/23/2022 0909   LDLCALC 147 (H) 06/24/2022 1514   Hepatic Function Panel     Component Value Date/Time   PROT 7.5 12/23/2022 0909   ALBUMIN 4.1 12/23/2022 0909   AST 17 12/23/2022 0909   ALT 22 12/23/2022 0909   ALKPHOS 75 12/23/2022 0909   BILITOT 0.3 12/23/2022 0909      Component Value Date/Time   TSH 1.520 12/23/2022 0909   Nutritional Lab Results  Component Value Date   VD25OH 36.1 12/23/2022    VD25OH 21.76 (L) 07/24/2022   VD25OH 31.2 05/08/2022     Return in about 3 weeks (around 06/09/2023) for For Weight Mangement with Dr. Rikki Spearing.[name] She was informed of the importance of frequent follow up visits to maximize her success with intensive lifestyle modifications for her multiple health conditions.   ATTESTASTION STATEMENTS:  Reviewed by clinician on day of visit: allergies, medications, problem list, medical history, surgical history, family history, social history, and previous encounter notes.     Worthy Rancher, MD

## 2023-05-19 NOTE — Assessment & Plan Note (Addendum)
Her insurance did not cover Zepbound.  She also did not tolerate metformin.  Her insurance is switching in January.  I feel that she would benefit from incretin therapy as she has prediabetes and also hyperinsulinemia.  She is therefore at risk for progression cardiovascular complications in the future.  Her HIV medications also increase the risk of metabolic derangements.

## 2023-05-19 NOTE — Assessment & Plan Note (Signed)
Patient has multiple obstacles which she is trying to overcome to improve her overall health.  She will work on increasing the number of days where she is eating balanced meals.  We also discussed the importance of increasing volume of physical activity which will also help her with insulin resistance.  See obesity treatment plan

## 2023-05-28 ENCOUNTER — Telehealth: Payer: Self-pay

## 2023-05-28 ENCOUNTER — Telehealth: Payer: Self-pay | Admitting: Family Medicine

## 2023-05-28 NOTE — Telephone Encounter (Signed)
Patient called requesting refill of Valtrex. Discussed that she would need to be scheduled with a new provider since Dr. Orvan Falconer has retired. Offered to get her scheduled and send her new provider a message to authorize refill. She would rather contact PCP for refill.   Call transferred to front desk.   Sandie Ano, RN

## 2023-05-28 NOTE — Telephone Encounter (Signed)
Pt called to advise that her infectious disease provider has retired and she needs a refill of valtrex for 30 days until she can establish with new infectious disease provider  Please send to [facility] on [address].

## 2023-05-28 NOTE — Telephone Encounter (Signed)
Okay to refill? 

## 2023-05-29 ENCOUNTER — Other Ambulatory Visit: Payer: Self-pay | Admitting: Family Medicine

## 2023-05-29 DIAGNOSIS — I10 Essential (primary) hypertension: Secondary | ICD-10-CM

## 2023-05-29 MED ORDER — VALACYCLOVIR HCL 500 MG PO TABS: 500.0000 mg | ORAL_TABLET | Freq: Two times a day (BID) | ORAL | 5 refills | Status: AC

## 2023-05-29 NOTE — Telephone Encounter (Signed)
Refill sent.

## 2023-05-29 NOTE — Addendum Note (Signed)
Addended by: Roxanne Gates on: 05/29/2023 08:35 AM   Modules accepted: Orders

## 2023-06-17 ENCOUNTER — Ambulatory Visit (INDEPENDENT_AMBULATORY_CARE_PROVIDER_SITE_OTHER): Payer: No Typology Code available for payment source | Admitting: Internal Medicine

## 2023-06-18 ENCOUNTER — Other Ambulatory Visit: Payer: No Typology Code available for payment source

## 2023-06-18 ENCOUNTER — Other Ambulatory Visit: Payer: Self-pay

## 2023-06-18 DIAGNOSIS — B20 Human immunodeficiency virus [HIV] disease: Secondary | ICD-10-CM

## 2023-06-18 NOTE — Addendum Note (Signed)
Addended by: Erlinda Hong on: 06/18/2023 03:52 PM   Modules accepted: Orders

## 2023-06-20 LAB — COMPREHENSIVE METABOLIC PANEL
AG Ratio: 1.4 (calc) (ref 1.0–2.5)
ALT: 15 U/L (ref 6–29)
AST: 16 U/L (ref 10–30)
Albumin: 4.1 g/dL (ref 3.6–5.1)
Alkaline phosphatase (APISO): 82 U/L (ref 31–125)
BUN: 18 mg/dL (ref 7–25)
CO2: 29 mmol/L (ref 20–32)
Calcium: 9.6 mg/dL (ref 8.6–10.2)
Chloride: 101 mmol/L (ref 98–110)
Creat: 0.85 mg/dL (ref 0.50–0.99)
Globulin: 3 g/dL (ref 1.9–3.7)
Glucose, Bld: 85 mg/dL (ref 65–99)
Potassium: 3.7 mmol/L (ref 3.5–5.3)
Sodium: 140 mmol/L (ref 135–146)
Total Bilirubin: 0.3 mg/dL (ref 0.2–1.2)
Total Protein: 7.1 g/dL (ref 6.1–8.1)

## 2023-06-20 LAB — LIPID PANEL
Cholesterol: 222 mg/dL — ABNORMAL HIGH (ref <200)
HDL: 48 mg/dL — ABNORMAL LOW (ref >=50)
LDL Cholesterol (Calc): 151 mg/dL — ABNORMAL HIGH
Non-HDL Cholesterol (Calc): 174 mg/dL — ABNORMAL HIGH (ref <130)
Total CHOL/HDL Ratio: 4.6 (calc) (ref <5.0)
Triglycerides: 114 mg/dL (ref <150)

## 2023-06-20 LAB — HIV-1 RNA QUANT-NO REFLEX-BLD
HIV 1 RNA Quant: NOT DETECTED {copies}/mL
HIV-1 RNA Quant, Log: NOT DETECTED {Log_copies}/mL

## 2023-06-20 LAB — CBC
HCT: 35.8 % (ref 35.0–45.0)
Hemoglobin: 12 g/dL (ref 11.7–15.5)
MCH: 30.3 pg (ref 27.0–33.0)
MCHC: 33.5 g/dL (ref 32.0–36.0)
MCV: 90.4 fL (ref 80.0–100.0)
MPV: 10.5 fL (ref 7.5–12.5)
Platelets: 334 10*3/uL (ref 140–400)
RBC: 3.96 10*6/uL (ref 3.80–5.10)
RDW: 12.7 % (ref 11.0–15.0)
WBC: 7.2 10*3/uL (ref 3.8–10.8)

## 2023-06-20 LAB — T-HELPER CELLS (CD4) COUNT (NOT AT ARMC)
Absolute CD4: 1472 {cells}/uL (ref 490–1740)
CD4 T Helper %: 42 % (ref 30–61)
Total lymphocyte count: 3516 {cells}/uL (ref 850–3900)

## 2023-06-20 LAB — RPR: RPR Ser Ql: NONREACTIVE

## 2023-06-29 ENCOUNTER — Ambulatory Visit: Payer: No Typology Code available for payment source | Admitting: Internal Medicine

## 2023-07-23 ENCOUNTER — Encounter (INDEPENDENT_AMBULATORY_CARE_PROVIDER_SITE_OTHER): Payer: Self-pay | Admitting: Internal Medicine

## 2023-07-23 ENCOUNTER — Ambulatory Visit (INDEPENDENT_AMBULATORY_CARE_PROVIDER_SITE_OTHER): Payer: Managed Care, Other (non HMO) | Admitting: Internal Medicine

## 2023-07-23 VITALS — BP 138/84 | HR 86 | Temp 98.2°F | Ht 62.0 in | Wt 282.0 lb

## 2023-07-23 DIAGNOSIS — R7303 Prediabetes: Secondary | ICD-10-CM | POA: Diagnosis not present

## 2023-07-23 DIAGNOSIS — I10 Essential (primary) hypertension: Secondary | ICD-10-CM | POA: Diagnosis not present

## 2023-07-23 DIAGNOSIS — Z6841 Body Mass Index (BMI) 40.0 and over, adult: Secondary | ICD-10-CM

## 2023-07-23 DIAGNOSIS — E66813 Obesity, class 3: Secondary | ICD-10-CM

## 2023-07-23 DIAGNOSIS — R638 Other symptoms and signs concerning food and fluid intake: Secondary | ICD-10-CM

## 2023-07-23 MED ORDER — ZEPBOUND 2.5 MG/0.5ML ~~LOC~~ SOAJ: 2.5000 mg | SUBCUTANEOUS | 0 refills | Status: DC

## 2023-07-23 NOTE — Assessment & Plan Note (Signed)
 Her blood pressure slightly at goal.  She is atenolol and hydrochlorothiazide.  Atenolol may cause weight gain.  I am hesitant about using sympathomimetics because of her blood pressure she may require intensification of blood pressure medications.

## 2023-07-23 NOTE — Assessment & Plan Note (Signed)
 Most recent A1c is  Lab Results  Component Value Date   HGBA1C 5.7 (H) 12/23/2022   with associated elevated insulin  levels.  We had tried metformin  but she experienced diarrhea so medication was discontinued.  Patient informed of disease state and risk of progression. This may contribute to abnormal cravings, fatigue and diabetes complications without having diabetes.   After discussion of benefits and side effects he will be started on Zepbound  for pharmacoprophylaxis and weight management

## 2023-07-23 NOTE — Progress Notes (Signed)
 Office: [phone]  /  Fax: [phone]  Weight Summary And Biometrics  Vitals Temp: 98.2 F (36.8 C) BP: 138/84 Pulse Rate: 86 SpO2: 100 %   Anthropometric Measurements Height: 5' 2 (1.575 m) Weight: 282 lb (127.9 kg) BMI (Calculated): 51.57 Weight at Last Visit: 275 lb Weight Lost Since Last Visit: 0 lb Weight Gained Since Last Visit: 7 lb Starting Weight: 278 lb Total Weight Loss (lbs): 0 lb (0 kg) Peak Weight: 278 lb   Body Composition  Body Fat %: 55.3 % Fat Mass (lbs): 156.2 lbs Muscle Mass (lbs): 119.8 lbs Visceral Fat Rating : 20    No data recorded Today's Visit #: 13  Starting Date: 04/24/22   Subjective   Chief Complaint: Obesity  Autumn Curry is here to discuss her progress with her obesity treatment plan. She is on the the Category 2 Plan  + 100 and states she is following her eating plan approximately 40 % of the time. She states she is exercising 15-30 minutes 2 times per week.  Interval History:   Discussed the use of AI scribe software for clinical note transcription with the patient, who gave verbal consent to proceed.  History of Present Illness   The patient, a 43 year old individual with obesity, prediabetes, essential hypertension, and hyperinsulinemia, presents for a medical weight management consultation. She reports adherence to a reduced-calorie nutrition plan approximately 40% of the time and engages in 15-30 minutes of exercise twice weekly. Over the recent holiday period, the patient admits to indulging in food and struggling with cravings. She describes her hunger as intense, rating it an 8 or 9 out of 10 when it occurs, and reports not feeling full or satisfied after meals.  The patient denies eating more than others or feeling guilty about her food intake. She also denies eating until she feels stuffed, but acknowledges increased hunger recently. She reports no history of sleep studies but admits to snoring, as reported by others.  She denies waking up gasping for air or feeling excessively tired during the day.  The patient has been on topiramate  for weight management, but reports discontinuing it due to the development of headaches. She expresses a desire for change and acknowledges the need for a different approach to her weight management. She is motivated by a workplace weight loss challenge and expresses a commitment to making necessary changes.  The patient's insurance situation is changing, and she is exploring coverage options for weight management injections. She expresses a willingness to try different medications and strategies to manage her weight and hunger. She acknowledges the need for long-term changes and expresses a commitment to improving her health.       Orexigenic Control:  Reports problems with appetite and hunger signals.  Reports problems with satiety and satiation.  Denies problems with eating patterns and portion control.  Denies abnormal cravings. Denies feeling deprived or restricted.   Barriers identified: strong hunger signals and impaired satiety / inhibitory control, having difficulty preparing healthy meals, having difficulty with meal prep and planning, having difficulty focusing on healthy eating, low volume of physical activity at present , and presence of obesogenic drugs.   Pharmacotherapy for weight loss: She is currently taking  topiramate  but medication was discontinued after 2 weeks because of morning headaches .   Assessment and Plan   Treatment Plan For Obesity:  Recommended Dietary Goals  Autumn Curry is currently in the action stage of change. As such, her goal is to continue weight management plan. She has  agreed to: continue to work on implementation of reduced calorie nutrition plan (RCNP)  Behavioral Intervention  We discussed the following Behavioral Modification Strategies today: continue to work on implementation of reduced calorie nutritional plan.  Additional  resources provided today: None  Recommended Physical Activity Goals  Autumn Curry has been advised to work up to 150 minutes of moderate intensity aerobic activity a week and strengthening exercises 2-3 times per week for cardiovascular health, weight loss maintenance and preservation of muscle mass.   She has agreed to :  Think about enjoyable ways to increase daily physical activity and overcoming barriers to exercise and Increase physical activity in their day and reduce sedentary time (increase NEAT).  Pharmacotherapy  We discussed various medication options to help Autumn Curry with her weight loss efforts and we both agreed to : start anti-obesity medication.  In addition to reduced calorie nutrition plan (RCNP), behavioral strategies and physical activity, Autumn Curry would benefit from pharmacotherapy to assist with hunger signals, satiety and cravings. This will reduce obesity-related health risks by inducing weight loss, and help reduce food consumption and adherence to [facility]) . It may also improve QOL by improving self-confidence and reduce the  setbacks associated with metabolic adaptations.  After discussion of treatment options, mechanisms of action, benefits, side effects, contraindications and shared decision making she is agreeable to starting Zepbound  2.5 mg once a week. Patient also made aware that medication is indicated for long-term management of obesity and the risk of weight regain following discontinuation of treatment and hence the importance of adhering to medical weight loss plan.  We demonstrated use of device and patient using teach back method was able to demonstrate proper technique.  Associated Conditions Addressed and Impacted by Obesity Treatment  Abnormal food appetite Assessment & Plan: She has increased orexigenic signaling, impaired satiety and inhibitory control. This is secondary to an abnormal energy regulation system and pathological neurohormonal pathways  characteristic of excess adiposity.  In addition to nutritional and behavioral strategies she benefits from pharmacotherapy.  After discussion of benefits and side effect she will be started on Zepbound  2.5 mg once a week   Orders: -     Zepbound ; Inject 2.5 mg into the skin once a week.  Dispense: 2 mL; Refill: 0  Pre-diabetes Assessment & Plan: Most recent A1c is  Lab Results  Component Value Date   HGBA1C 5.7 (H) 12/23/2022   with associated elevated insulin  levels.  We had tried metformin  but she experienced diarrhea so medication was discontinued.  Patient informed of disease state and risk of progression. This may contribute to abnormal cravings, fatigue and diabetes complications without having diabetes.   After discussion of benefits and side effects he will be started on Zepbound  for pharmacoprophylaxis and weight management   Orders: -     Zepbound ; Inject 2.5 mg into the skin once a week.  Dispense: 2 mL; Refill: 0  Class 3 severe obesity with serious comorbidity and body mass index (BMI) of 50.0 to 59.9 in adult, unspecified obesity type [facility]) Assessment & Plan: Autumn Curry has struggled losing weight she sometimes feels discouraged due to increased hunger signals and impaired satiety.  She has been screened negative for eating disorder but she has abnormal hunger signals which makes adherence to a reduced calorie nutrition plan very difficult.  I feel that she would benefit from GLP-1 therapy.  After discussion of benefits and side effects she is agreeable to trying Zepbound  2.5 mg once a week.  She will continue to work on  implementing reduced calorie nutrition plan.  Patient has also been counseled on the carb insulin  model of obesity as she is sensitive to carbohydrates and is aware of maintaining a diet with a low glycemic load.  Orders: -     Zepbound ; Inject 2.5 mg into the skin once a week.  Dispense: 2 mL; Refill: 0  Essential hypertension Assessment & Plan: Her blood  pressure slightly at goal.  She is atenolol  and hydrochlorothiazide .  Atenolol  may cause weight gain.  I am hesitant about using sympathomimetics because of her blood pressure she may require intensification of blood pressure medications.   Orders: -     Zepbound ; Inject 2.5 mg into the skin once a week.  Dispense: 2 mL; Refill: 0     Objective   Physical Exam:  Blood pressure 138/84, pulse 86, temperature 98.2 F (36.8 C), height 5' 2 (1.575 m), weight 282 lb (127.9 kg), SpO2 100%. Body mass index is 51.58 kg/m.  General: She is overweight, cooperative, alert, well developed, and in no acute distress. PSYCH: Has normal mood, affect and thought process.   HEENT: EOMI, sclerae are anicteric. Lungs: Normal breathing effort, no conversational dyspnea. Extremities: No edema.  Neurologic: No gross sensory or motor deficits. No tremors or fasciculations noted.    Diagnostic Data Reviewed:  BMET    Component Value Date/Time   NA 140 06/18/2023 1552   NA 140 12/23/2022 0909   K 3.7 06/18/2023 1552   CL 101 06/18/2023 1552   CO2 29 06/18/2023 1552   GLUCOSE 85 06/18/2023 1552   BUN 18 06/18/2023 1552   BUN 15 12/23/2022 0909   CREATININE 0.85 06/18/2023 1552   CALCIUM  9.6 06/18/2023 1552   GFRNONAA 99 03/15/2020 0758   GFRNONAA 78 04/26/2019 1429   GFRAA 114 03/15/2020 0758   GFRAA 91 04/26/2019 1429   Lab Results  Component Value Date   HGBA1C 5.7 (H) 12/23/2022   HGBA1C 5.6 05/25/2017   Lab Results  Component Value Date   INSULIN  46.7 (H) 12/23/2022   INSULIN  31.4 (H) 12/14/2017   Lab Results  Component Value Date   TSH 1.520 12/23/2022   CBC    Component Value Date/Time   WBC 7.2 06/18/2023 1552   RBC 3.96 06/18/2023 1552   HGB 12.0 06/18/2023 1552   HGB 12.5 12/23/2022 0909   HCT 35.8 06/18/2023 1552   HCT 37.7 12/23/2022 0909   PLT 334 06/18/2023 1552   PLT 360 12/23/2022 0909   MCV 90.4 06/18/2023 1552   MCV 90 12/23/2022 0909   MCH 30.3  06/18/2023 1552   MCHC 33.5 06/18/2023 1552   RDW 12.7 06/18/2023 1552   RDW 13.1 12/23/2022 0909   Iron Studies No results found for: IRON, TIBC, FERRITIN, IRONPCTSAT Lipid Panel     Component Value Date/Time   CHOL 222 (H) 06/18/2023 1552   CHOL 218 (H) 12/23/2022 0909   TRIG 114 06/18/2023 1552   HDL 48 (L) 06/18/2023 1552   HDL 41 12/23/2022 0909   CHOLHDL 4.6 06/18/2023 1552   VLDL 39.6 07/24/2022 1537   LDLCALC 151 (H) 06/18/2023 1552   Hepatic Function Panel     Component Value Date/Time   PROT 7.1 06/18/2023 1552   PROT 7.5 12/23/2022 0909   ALBUMIN 4.1 12/23/2022 0909   AST 16 06/18/2023 1552   ALT 15 06/18/2023 1552   ALKPHOS 75 12/23/2022 0909   BILITOT 0.3 06/18/2023 1552   BILITOT 0.3 12/23/2022 0909  Component Value Date/Time   TSH 1.520 12/23/2022 0909   Nutritional Lab Results  Component Value Date   VD25OH 36.1 12/23/2022   VD25OH 21.76 (L) 07/24/2022   VD25OH 31.2 05/08/2022    Follow-Up   Return in about 3 weeks (around 08/13/2023) for For Weight Mangement with Dr. Francyne.SABRA She was informed of the importance of frequent follow up visits to maximize her success with intensive lifestyle modifications for her multiple health conditions.  Attestation Statement   Reviewed by clinician on day of visit: allergies, medications, problem list, medical history, surgical history, family history, social history, and previous encounter notes.     Lucas Francyne, MD

## 2023-07-23 NOTE — Assessment & Plan Note (Signed)
 Autumn Curry has struggled losing weight she sometimes feels discouraged due to increased hunger signals and impaired satiety.  She has been screened negative for eating disorder but she has abnormal hunger signals which makes adherence to a reduced calorie nutrition plan very difficult.  I feel that she would benefit from GLP-1 therapy.  After discussion of benefits and side effects she is agreeable to trying Zepbound  2.5 mg once a week.  She will continue to work on implementing reduced calorie nutrition plan.  Patient has also been counseled on the carb insulin  model of obesity as she is sensitive to carbohydrates and is aware of maintaining a diet with a low glycemic load.

## 2023-07-23 NOTE — Assessment & Plan Note (Signed)
 She has increased orexigenic signaling, impaired satiety and inhibitory control. This is secondary to an abnormal energy regulation system and pathological neurohormonal pathways characteristic of excess adiposity.  In addition to nutritional and behavioral strategies she benefits from pharmacotherapy.  After discussion of benefits and side effect she will be started on Zepbound 2.5 mg once a week

## 2023-08-09 ENCOUNTER — Other Ambulatory Visit: Payer: Self-pay | Admitting: Family Medicine

## 2023-08-09 DIAGNOSIS — I10 Essential (primary) hypertension: Secondary | ICD-10-CM

## 2023-08-12 ENCOUNTER — Telehealth: Payer: Self-pay

## 2023-08-12 NOTE — Telephone Encounter (Signed)
Returned pt's VM in regards to rescheduling recent missed f/u with Dr. Thedore Mins. No answer and VM box is full. She completed her lab appt in Dec., but is due for her 1 year f/u.

## 2023-08-18 ENCOUNTER — Ambulatory Visit (INDEPENDENT_AMBULATORY_CARE_PROVIDER_SITE_OTHER): Payer: Managed Care, Other (non HMO) | Admitting: Internal Medicine

## 2023-08-18 ENCOUNTER — Encounter (INDEPENDENT_AMBULATORY_CARE_PROVIDER_SITE_OTHER): Payer: Self-pay | Admitting: Internal Medicine

## 2023-08-18 DIAGNOSIS — I10 Essential (primary) hypertension: Secondary | ICD-10-CM | POA: Diagnosis not present

## 2023-08-18 DIAGNOSIS — E66813 Obesity, class 3: Secondary | ICD-10-CM | POA: Diagnosis not present

## 2023-08-18 DIAGNOSIS — Z6841 Body Mass Index (BMI) 40.0 and over, adult: Secondary | ICD-10-CM

## 2023-08-18 DIAGNOSIS — R7303 Prediabetes: Secondary | ICD-10-CM

## 2023-08-18 DIAGNOSIS — R638 Other symptoms and signs concerning food and fluid intake: Secondary | ICD-10-CM

## 2023-08-18 MED ORDER — PHENTERMINE HCL 37.5 MG PO TABS: 18.7500 mg | ORAL_TABLET | Freq: Every day | ORAL | 0 refills | Status: AC

## 2023-08-18 NOTE — Assessment & Plan Note (Signed)
She has increased orexigenic signaling, impaired satiety and inhibitory control. This is secondary to an abnormal energy regulation system and pathological neurohormonal pathways characteristic of excess adiposity.  In addition to nutritional and behavioral strategies she benefits from pharmacotherapy.  After discussion of benefits and side effect she will be started on phentermine 18.75 mg once a week.

## 2023-08-18 NOTE — Assessment & Plan Note (Signed)
 Repeat blood pressure was 134/84.  She is on atenolol  and hydrochlorothiazide .  Atenolol  may cause weight gain.  She benefits from antiobesity medication and after discussion of benefits and side effects the need for monitoring we have decided to do low-dose phentermine .  She is to monitor blood pressure and heart rate and discontinue use if blood pressure increases.

## 2023-08-18 NOTE — Assessment & Plan Note (Signed)
 Patient is intolerant to metformin .  She is high risk for progression.  Insurance denied GLP-1 coverage.  Patient has been counseled on maintaining a diet low in simple and added sugars.  Also increasing physical activity.  Losing 10% of body weight may improve condition.

## 2023-08-18 NOTE — Progress Notes (Signed)
 Office: [phone]  /  Fax: [phone]  Weight Summary And Biometrics  Vitals Temp: 98.1 F (36.7 C) BP: 136/86 Pulse Rate: 85 SpO2: 100 %   Anthropometric Measurements Height: 5' 2 (1.575 m) Weight: 278 lb (126.1 kg) BMI (Calculated): 50.83 Weight at Last Visit: 282 lb Weight Lost Since Last Visit: 4 lb Weight Gained Since Last Visit: 0 lb Starting Weight: 278 lb Total Weight Loss (lbs): 0 lb (0 kg) Peak Weight: 278 lb   Body Composition  Body Fat %: 53.4 % Fat Mass (lbs): 148.4 lbs Muscle Mass (lbs): 123 lbs Total Body Water (lbs): 96.6 lbs Visceral Fat Rating : 19    No data recorded Today's Visit #: 14  Starting Date: 04/24/22   Subjective   Chief Complaint: Obesity  Autumn Curry is here to discuss her progress with her obesity treatment plan. She is on the the Category 2 Plan + 100  and states she is following her eating plan approximately 75 % of the time. She states she is exercising 15-30 minutes 3 times per week.75  Weight Progress Since Last Visit:  Since last office visit she has lost 4 pounds. She reports good adherence to reduced calorie nutritional plan. She has been working on reading food labels, not skipping meals, increasing protein intake at every meal, drinking more water, making healthier choices, reducing portion sizes, and incorporating more whole foods   Her insurance has denied Zepbound   Challenges affecting patient progress: cost of medication, strong hunger signals and/or impaired satiety / inhibitory control, and low volume of physical activity at present .   Orexigenic Control: Reports problems with appetite and hunger signals.  Reports problems with satiety and satiation.  Denies problems with eating patterns and portion control.  Denies abnormal cravings. Denies feeling deprived or restricted.   Pharmacotherapy for weight management: She is currently taking: insurance denied Zepbound .  She had loose stools with metformin   and headaches with topiramate   Assessment and Plan   Treatment Plan For Obesity:  Recommended Dietary Goals  Autumn Curry is currently in the action stage of change. As such, her goal is to continue weight management plan. She has agreed to: continue current plan  Behavioral Health and Counseling  We discussed the following behavioral modification strategies today: continue to work on maintaining a reduced calorie state, getting the recommended amount of protein, incorporating whole foods, making healthy choices, staying well hydrated and practicing mindfulness when eating..  Additional education and resources provided today: None  Recommended Physical Activity Goals  Autumn Curry has been advised to work up to 150 minutes of moderate intensity aerobic activity a week and strengthening exercises 2-3 times per week for cardiovascular health, weight loss maintenance and preservation of muscle mass.   She has agreed to :  continue to gradually increase the amount and intensity of exercise routine  Pharmacotherapy  We discussed various medication options to help Autumn Curry with her weight loss efforts and we both agreed to : start anti-obesity medication.  In addition to reduced calorie nutrition plan (RCNP), behavioral strategies and physical activity, Autumn Curry would benefit from pharmacotherapy to assist with hunger signals, satiety and cravings. This will reduce obesity-related health risks by inducing weight loss, and help reduce food consumption and adherence to [facility]) . It may also improve QOL by improving self-confidence and reduce the  setbacks associated with metabolic adaptations.  After discussion of treatment options, mechanisms of action, benefits, side effects, contraindications and shared decision making she is agreeable to starting phentermine  18.75 mg once  a day. Patient also made aware that medication is indicated for long-term management of obesity and the risk of weight regain  following discontinuation of treatment and hence the importance of adhering to medical weight loss plan.   Associated Conditions Impacted by Obesity Treatment  Abnormal food appetite Assessment & Plan: She has increased orexigenic signaling, impaired satiety and inhibitory control. This is secondary to an abnormal energy regulation system and pathological neurohormonal pathways characteristic of excess adiposity.  In addition to nutritional and behavioral strategies she benefits from pharmacotherapy.  After discussion of benefits and side effect she will be started on phentermine  18.75 mg once a week.   Orders: -     Phentermine  HCl; Take 0.5 tablets (18.75 mg total) by mouth daily before breakfast.  Dispense: 15 tablet; Refill: 0  Pre-diabetes Assessment & Plan: Patient is intolerant to metformin .  She is high risk for progression.  Insurance denied GLP-1 coverage.  Patient has been counseled on maintaining a diet low in simple and added sugars.  Also increasing physical activity.  Losing 10% of body weight may improve condition.   Class 3 severe obesity with serious comorbidity and body mass index (BMI) of 50.0 to 59.9 in adult, unspecified obesity type [facility]) Assessment & Plan: Autumn Curry has struggled losing weight she sometimes feels discouraged due to increased hunger signals and impaired satiety.  She has been screened negative for eating disorder but she has abnormal hunger signals which makes adherence to a reduced calorie nutrition plan very difficult.  I feel that she would benefit from GLP-1 therapy but insurance denied coverage.  After discussion of benefits and side effects she is agreeable to trying phentermine  18.75 mg once a week.  She had side effects to topiramate  and metformin .  She will continue with implementation of reduced calorie nutrition plan  Orders: -     Phentermine  HCl; Take 0.5 tablets (18.75 mg total) by mouth daily before breakfast.  Dispense: 15 tablet; Refill:  0  Essential hypertension Assessment & Plan: Repeat blood pressure was 134/84.  She is on atenolol  and hydrochlorothiazide .  Atenolol  may cause weight gain.  She benefits from antiobesity medication and after discussion of benefits and side effects the need for monitoring we have decided to do low-dose phentermine .  She is to monitor blood pressure and heart rate and discontinue use if blood pressure increases.      Objective   Physical Exam:  Blood pressure 136/86, pulse 85, temperature 98.1 F (36.7 C), height 5' 2 (1.575 m), weight 278 lb (126.1 kg), SpO2 100%. Body mass index is 50.85 kg/m.  General: She is overweight, cooperative, alert, well developed, and in no acute distress. PSYCH: Has normal mood, affect and thought process.   HEENT: EOMI, sclerae are anicteric. Lungs: Normal breathing effort, no conversational dyspnea. Extremities: No edema.  Neurologic: No gross sensory or motor deficits. No tremors or fasciculations noted.    Diagnostic Data Reviewed:  BMET    Component Value Date/Time   NA 140 06/18/2023 1552   NA 140 12/23/2022 0909   K 3.7 06/18/2023 1552   CL 101 06/18/2023 1552   CO2 29 06/18/2023 1552   GLUCOSE 85 06/18/2023 1552   BUN 18 06/18/2023 1552   BUN 15 12/23/2022 0909   CREATININE 0.85 06/18/2023 1552   CALCIUM  9.6 06/18/2023 1552   GFRNONAA 99 03/15/2020 0758   GFRNONAA 78 04/26/2019 1429   GFRAA 114 03/15/2020 0758   GFRAA 91 04/26/2019 1429   Lab Results  Component Value  Date   HGBA1C 5.7 (H) 12/23/2022   HGBA1C 5.6 05/25/2017   Lab Results  Component Value Date   INSULIN  46.7 (H) 12/23/2022   INSULIN  31.4 (H) 12/14/2017   Lab Results  Component Value Date   TSH 1.520 12/23/2022   CBC    Component Value Date/Time   WBC 7.2 06/18/2023 1552   RBC 3.96 06/18/2023 1552   HGB 12.0 06/18/2023 1552   HGB 12.5 12/23/2022 0909   HCT 35.8 06/18/2023 1552   HCT 37.7 12/23/2022 0909   PLT 334 06/18/2023 1552   PLT 360  12/23/2022 0909   MCV 90.4 06/18/2023 1552   MCV 90 12/23/2022 0909   MCH 30.3 06/18/2023 1552   MCHC 33.5 06/18/2023 1552   RDW 12.7 06/18/2023 1552   RDW 13.1 12/23/2022 0909   Iron Studies No results found for: IRON, TIBC, FERRITIN, IRONPCTSAT Lipid Panel     Component Value Date/Time   CHOL 222 (H) 06/18/2023 1552   CHOL 218 (H) 12/23/2022 0909   TRIG 114 06/18/2023 1552   HDL 48 (L) 06/18/2023 1552   HDL 41 12/23/2022 0909   CHOLHDL 4.6 06/18/2023 1552   VLDL 39.6 07/24/2022 1537   LDLCALC 151 (H) 06/18/2023 1552   Hepatic Function Panel     Component Value Date/Time   PROT 7.1 06/18/2023 1552   PROT 7.5 12/23/2022 0909   ALBUMIN 4.1 12/23/2022 0909   AST 16 06/18/2023 1552   ALT 15 06/18/2023 1552   ALKPHOS 75 12/23/2022 0909   BILITOT 0.3 06/18/2023 1552   BILITOT 0.3 12/23/2022 0909      Component Value Date/Time   TSH 1.520 12/23/2022 0909   Nutritional Lab Results  Component Value Date   VD25OH 36.1 12/23/2022   VD25OH 21.76 (L) 07/24/2022   VD25OH 31.2 05/08/2022    Follow-Up   Return in about 4 weeks (around 09/15/2023) for For Weight Mangement with Dr. Francyne.Autumn Curry She was informed of the importance of frequent follow up visits to maximize her success with intensive lifestyle modifications for her multiple health conditions.  Attestation Statement   Reviewed by clinician on day of visit: allergies, medications, problem list, medical history, surgical history, family history, social history, and previous encounter notes.     Lucas Francyne, MD

## 2023-08-18 NOTE — Assessment & Plan Note (Signed)
Autumn Curry has struggled losing weight she sometimes feels discouraged due to increased hunger signals and impaired satiety.  She has been screened negative for eating disorder but she has abnormal hunger signals which makes adherence to a reduced calorie nutrition plan very difficult.  I feel that she would benefit from GLP-1 therapy but insurance denied coverage.  After discussion of benefits and side effects she is agreeable to trying phentermine 18.75 mg once a week.  She had side effects to topiramate and metformin.  She will continue with implementation of reduced calorie nutrition plan

## 2023-08-20 ENCOUNTER — Encounter: Payer: Self-pay | Admitting: Internal Medicine

## 2023-08-20 ENCOUNTER — Other Ambulatory Visit (HOSPITAL_COMMUNITY)
Admission: RE | Admit: 2023-08-20 | Discharge: 2023-08-20 | Disposition: A | Discharge status: Home / Self Care | Payer: Managed Care, Other (non HMO) | Source: Ambulatory Visit | Attending: Internal Medicine | Admitting: Internal Medicine

## 2023-08-20 ENCOUNTER — Ambulatory Visit: Payer: Managed Care, Other (non HMO) | Admitting: Internal Medicine

## 2023-08-20 ENCOUNTER — Other Ambulatory Visit: Payer: Self-pay

## 2023-08-20 VITALS — BP 136/87 | HR 72 | Temp 98.7°F | Ht 62.0 in | Wt 282.0 lb

## 2023-08-20 DIAGNOSIS — Z23 Encounter for immunization: Secondary | ICD-10-CM

## 2023-08-20 DIAGNOSIS — B20 Human immunodeficiency virus [HIV] disease: Secondary | ICD-10-CM | POA: Diagnosis present

## 2023-08-20 DIAGNOSIS — Z21 Asymptomatic human immunodeficiency virus [HIV] infection status: Secondary | ICD-10-CM

## 2023-08-20 MED ORDER — SYMTUZA 800-150-200-10 MG PO TABS: ORAL_TABLET | ORAL | 5 refills | Status: DC

## 2023-08-20 NOTE — Progress Notes (Signed)
 [facility]     HPI: Autumn Curry is a 43 y.o. female presents for HIV management. No missed doses of symtuza .   Date of diagnosis 2008 ART exposure:  combivir  and kaletra ->sym toza Past Ois: none Risk factors: heterosexual Partners , in the last 12 months 1.Married for 17 years Vaginal penile sex, contraception yes  Social: Occupation: Designer, Jewellery Housing: House with huband Support: Husband Understanding of HIV: Etoh/drug/tobacco use: no/no/no  Past Medical History:  Diagnosis Date   Absence of menstruation    Carbuncle and furuncle of unspecified site    Elevated blood pressure reading without diagnosis of hypertension    Encounter for long-term (current) use of other medications    Headache(784.0)    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   Hyperlipidemia    Hypertension    Migraine    Prediabetes    Routine general medical examination at a health care facility    Screening for malignant neoplasm of the cervix    Syphilis, unspecified     Past Surgical History:  Procedure Laterality Date   NO PAST SURGERIES      Family History  Problem Relation Age of Onset   Hyperlipidemia Mother    Hypertension Mother    Depression Mother    Coronary artery disease Father    Diabetes Father    Heart disease Father    Hyperlipidemia Father    Stroke Father    Kidney disease Father    Colon cancer Neg Hx    Esophageal cancer Neg Hx    Colon polyps Neg Hx    Pancreatic cancer Neg Hx    Stomach cancer Neg Hx    Current Outpatient Medications on File Prior to Visit  Medication Sig Dispense Refill   aspirin-acetaminophen -caffeine (EXCEDRIN MIGRAINE) 250-250-65 MG tablet Take by mouth every 6 (six) hours as needed for headache.     atenolol  (TENORMIN ) 25 MG tablet Take 1 tablet (25 mg total) by mouth daily. 90 tablet 0   eletriptan  (RELPAX ) 20 MG tablet Take 1 tablet (20 mg total) by mouth as needed for migraine or  headache. One tablet by mouth at onset of headache. May repeat in 2 hours if headache persists or recurs. (Patient not taking: Reported on 08/20/2023) 10 tablet 1   hydrochlorothiazide  (HYDRODIURIL ) 25 MG tablet Take 1 tablet by mouth once daily 90 tablet 0   loratadine (CLARITIN) 10 MG tablet Take 10 mg by mouth daily.     Multiple Vitamins-Minerals (ALIVE WOMENS ENERGY) TABS Take 1 tablet by mouth daily.     NONFORMULARY OR COMPOUNDED ITEM Compression socks  20-30 mm/hg---as directed for edema 2 each 1   nystatin -triamcinolone  ointment (MYCOLOG) Apply topically 2 (two) times daily. (Patient not taking: Reported on 08/20/2023) 30 g 0   phentermine  (ADIPEX-P ) 37.5 MG tablet Take 0.5 tablets (18.75 mg total) by mouth daily before breakfast. (Patient not taking: Reported on 08/20/2023) 15 tablet 0   SYMTUZA  800-150-200-10 MG TABS TAKE 1 TABLET BY MOUTH 1 TIME A DAY WITH BREAKFAST 30 tablet 5   valACYclovir  (VALTREX ) 500 MG tablet Take 1 tablet (500 mg total) by mouth 2 (two) times daily. 10 tablet 5   No current facility-administered medications on file prior to visit.    Allergies  Allergen Reactions   Topiramate  Other (See Comments)    ha   Metformin  And Related Diarrhea      Lab Results HIV 1 RNA  Quant (Copies/mL)  Date Value  06/18/2023 Not Detected  06/24/2022 Not Detected  06/05/2021 Not Detected   CD4 T Cell Abs (/uL)  Date Value  06/24/2022 1,070  05/07/2020 1,053  04/26/2019 1,111   No results found for: HIV1GENOSEQ Lab Results  Component Value Date   WBC 7.2 06/18/2023   HGB 12.0 06/18/2023   HCT 35.8 06/18/2023   MCV 90.4 06/18/2023   PLT 334 06/18/2023    Lab Results  Component Value Date   CREATININE 0.85 06/18/2023   BUN 18 06/18/2023   NA 140 06/18/2023   K 3.7 06/18/2023   CL 101 06/18/2023   CO2 29 06/18/2023   Lab Results  Component Value Date   ALT 15 06/18/2023   AST 16 06/18/2023   ALKPHOS 75 12/23/2022   BILITOT 0.3 06/18/2023    Lab Results   Component Value Date   CHOL 222 (H) 06/18/2023   TRIG 114 06/18/2023   HDL 48 (L) 06/18/2023   LDLCALC 151 (H) 06/18/2023   No results found for: HAV Lab Results  Component Value Date   HEPBSAG Negative 09/11/2006   HEPBSAB Negative 09/11/2006   Lab Results  Component Value Date   HCVAB Negative 09/11/2006   Lab Results  Component Value Date   CHLAMYDIAWP Negative 05/26/2019   N Negative 05/26/2019   No results found for: GCPROBEAPT No results found for: QUANTGOLD  Assessment/Plan #HIV Results           Assessment and Plan #HIV/Asymptomatic -[id number] on 12/23, VL ND, on 12/24 on Symtuza  -HIV Labs today -F/U in one year   #Vaccination COVID Flu today 08/20/23 PCV 23 /n/ 05/26/2019 Meningitis HepA-serology HEpB-serology(imm x 3 dose) Tdap 2017 Shingles  #Health maintenance -Quantiferon today 08/20/23 -RPR NR 06/18/23 -HCV today 08/20/23 -GCtoday 08/20/23 -Lipid  The 10-year ASCVD risk score (Arnett DK, et al., 2019) is: 3.8%   Values used to calculate the score:     Age: 16 years     Sex: Female     Is Non-Hispanic African American: Yes     Diabetic: No     Tobacco smoker: No     Systolic Blood Pressure: 136 mmHg     Is BP treated: Yes     HDL Cholesterol: 48 mg/dL     Total Cholesterol: 222 mg/dL  -Dysplasia screen F: F/U PCP or gyn -Mammogram ->one year ago Age dependent: -Colonoscopy: Discuss at next visit    Loney Stank, MD [facility]   I have personally spent 64 minutes involved in face-to-face and non-face-to-face activities for this patient on the day of the visit. Professional time spent includes the following activities: Preparing to see the patient (review of tests), Obtaining and/or reviewing separately obtained history (admission/discharge record), Performing a medically appropriate examination and/or evaluation , Ordering medications/tests/procedures, referring and  communicating with other health care professionals, Documenting clinical information in the EMR, Independently interpreting results (not separately reported), Communicating results to the patient/family/caregiver, Counseling and educating the patient/family/caregiver and Care coordination (not separately reported).

## 2023-08-23 LAB — QUANTIFERON-TB GOLD PLUS
Mitogen-NIL: 7.35 [IU]/mL
NIL: 0.12 [IU]/mL
QuantiFERON-TB Gold Plus: NEGATIVE
TB1-NIL: 0.04 [IU]/mL
TB2-NIL: 0.11 [IU]/mL

## 2023-08-23 LAB — HEPATITIS A ANTIBODY, TOTAL: Hepatitis A AB,Total: NONREACTIVE

## 2023-08-23 LAB — HEPATITIS B SURFACE ANTIBODY,QUALITATIVE: Hep B S Ab: NONREACTIVE

## 2023-08-23 LAB — HEPATITIS B SURFACE ANTIGEN: Hepatitis B Surface Ag: NONREACTIVE

## 2023-08-23 LAB — HEPATITIS B CORE ANTIBODY, TOTAL: Hep B Core Total Ab: NONREACTIVE

## 2023-08-23 LAB — HEPATITIS C ANTIBODY: Hepatitis C Ab: NONREACTIVE

## 2023-08-24 LAB — URINE CYTOLOGY ANCILLARY ONLY
Chlamydia: NEGATIVE
Comment: NEGATIVE
Comment: NORMAL
Neisseria Gonorrhea: NEGATIVE

## 2023-08-25 ENCOUNTER — Other Ambulatory Visit: Payer: Self-pay | Admitting: Family Medicine

## 2023-08-25 DIAGNOSIS — I10 Essential (primary) hypertension: Secondary | ICD-10-CM

## 2023-09-17 ENCOUNTER — Ambulatory Visit (INDEPENDENT_AMBULATORY_CARE_PROVIDER_SITE_OTHER): Payer: Managed Care, Other (non HMO) | Admitting: Internal Medicine

## 2023-09-24 ENCOUNTER — Encounter (INDEPENDENT_AMBULATORY_CARE_PROVIDER_SITE_OTHER): Payer: Self-pay

## 2023-11-10 ENCOUNTER — Other Ambulatory Visit: Payer: Self-pay | Admitting: Family Medicine

## 2023-11-10 DIAGNOSIS — I10 Essential (primary) hypertension: Secondary | ICD-10-CM

## 2023-11-20 ENCOUNTER — Other Ambulatory Visit: Payer: Self-pay | Admitting: Family Medicine

## 2023-11-20 DIAGNOSIS — I10 Essential (primary) hypertension: Secondary | ICD-10-CM

## 2023-11-30 NOTE — Progress Notes (Signed)
 The 10-year ASCVD risk score (Arnett DK, et al., 2019) is: 3.8%   Values used to calculate the score:     Age: 43 years     Sex: Female     Is Non-Hispanic African American: Yes     Diabetic: No     Tobacco smoker: No     Systolic Blood Pressure: 136 mmHg     Is BP treated: Yes     HDL Cholesterol: 48 mg/dL     Total Cholesterol: 222 mg/dL  Arlon Bergamo, BSN, RN

## 2024-01-07 ENCOUNTER — Ambulatory Visit: Admitting: Family Medicine

## 2024-01-07 ENCOUNTER — Encounter: Payer: Self-pay | Admitting: Family Medicine

## 2024-01-07 VITALS — BP 122/88 | HR 73 | Temp 98.0°F | Resp 18 | Ht 62.0 in | Wt 294.2 lb

## 2024-01-07 DIAGNOSIS — B353 Tinea pedis: Secondary | ICD-10-CM | POA: Diagnosis not present

## 2024-01-07 DIAGNOSIS — I10 Essential (primary) hypertension: Secondary | ICD-10-CM | POA: Diagnosis not present

## 2024-01-07 DIAGNOSIS — R7303 Prediabetes: Secondary | ICD-10-CM

## 2024-01-07 DIAGNOSIS — B20 Human immunodeficiency virus [HIV] disease: Secondary | ICD-10-CM

## 2024-01-07 LAB — HM MAMMOGRAPHY

## 2024-01-07 MED ORDER — TERBINAFINE HCL 250 MG PO TABS: 250.0000 mg | ORAL_TABLET | Freq: Every day | ORAL | 0 refills | Status: AC

## 2024-01-07 MED ORDER — CLOTRIMAZOLE-BETAMETHASONE 1-0.05 % EX CREA: 1.0000 | TOPICAL_CREAM | Freq: Every day | CUTANEOUS | 0 refills | Status: DC

## 2024-01-07 NOTE — Assessment & Plan Note (Signed)
 Lamisil every day x 2 weeks  Lostrisone cream sent  Return to office as needed

## 2024-01-07 NOTE — Progress Notes (Signed)
 Established Patient Office Visit  Subjective   Patient ID: Autumn Curry, female    DOB: 1980-08-14  Age: 43 y.o. MRN: [id number]  Chief Complaint  Patient presents with   Rash    On and off, both feet, itchy and scaley, no raised    HPI Discussed the use of AI scribe software for clinical note transcription with the patient, who gave verbal consent to proceed.  History of Present Illness Autumn Curry is a 43 year old female with HIV who presents with recurrent itchy feet.  She experiences recurrent itching on her feet, primarily affecting one foot more than the other. The itching is persistent and tends to worsen in hot weather and at night. She has been using a cream previously prescribed, which provided some relief, but the symptoms have returned. She recalls using an oral medication in the past, which was effective for a while, but she cannot remember the name. The itching tends to recur, especially after scratching, which she tries to avoid by keeping her nails short. She is concerned about the impact of this condition on her daily life, particularly as she plans to travel to the [place] in July and wishes to wear sandals comfortably.  She has a history of HIV, which is currently undetectable. She is unsure about her blood sugar levels, which she acknowledges could contribute to her risk of yeast infections. She has been involved with a healthy weight and wellness program in the past.  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   Class 3 severe obesity without serious comorbidity with body mass index (BMI) of 50.0 to  59.9 in adult 04/24/2022   Anxiety 07/02/2021   At risk for impaired metabolic function 08/20/2020   Vitamin D  deficiency 09/15/2019   Class 3 severe obesity with serious comorbidity and body mass index (BMI) of 50.0 to 59.9 in adult 12/23/2018   Insulin  resistance 12/23/2018   Hyperlipidemia 01/26/2018   Shortness of breath on exertion 12/14/2017   Essential hypertension 12/14/2017   Preventative health care 08/18/2016   Genital herpes 02/16/2012   Morbid obesity (HCC) 09/30/2011   AMENORRHEA 08/31/2007   Carbuncle and furuncle 08/31/2007   Headache 08/31/2007   SYPHILIS NOS 09/11/2006   Human immunodeficiency virus (HIV) disease (HCC) 09/03/2006   Past Medical History:  Diagnosis Date   Absence of menstruation    Carbuncle and furuncle of unspecified site    Elevated blood pressure reading without diagnosis of hypertension    Encounter for long-term (current) use of other medications    Headache(784.0)    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   Hyperlipidemia    Hypertension    Migraine    Prediabetes    Routine general medical examination at a health care facility    Screening for malignant neoplasm of the cervix    Syphilis, unspecified    Past Surgical History:  Procedure Laterality Date   NO PAST SURGERIES     Social History  Tobacco Use   Smoking status: Never   Smokeless tobacco: Never  Vaping Use   Vaping status: Never Used  Substance Use Topics   Alcohol use: Yes    Alcohol/week: 0.0 standard drinks of alcohol    Comment: rarely   Drug use: No   Social History   Socioeconomic History   Marital status: Married    Spouse name: Kameren Baade   Number of children: 1   Years of education: Not on file   Highest education level: Associate degree: academic program  Occupational History   Occupation: Runner, broadcasting/film/video    Comment: head start   Tobacco Use   Smoking status: Never   Smokeless tobacco: Never  Vaping Use   Vaping status: Never Used  Substance  and Sexual Activity   Alcohol use: Yes    Alcohol/week: 0.0 standard drinks of alcohol    Comment: rarely   Drug use: No   Sexual activity: Yes    Partners: Male    Birth control/protection: Condom  Other Topics Concern   Not on file  Social History Narrative   Not on file   Social Drivers of Health   Financial Resource Strain: Low Risk  (01/06/2024)   Overall Financial Resource Strain (CARDIA)    Difficulty of Paying Living Expenses: Not very hard  Food Insecurity: No Food Insecurity (01/06/2024)   Hunger Vital Sign    Worried About Running Out of Food in the Last Year: Never true    Ran Out of Food in the Last Year: Never true  Transportation Needs: No Transportation Needs (01/06/2024)   PRAPARE - Administrator, Civil Service (Medical): No    Lack of Transportation (Non-Medical): No  Physical Activity: Insufficiently Active (01/06/2024)   Exercise Vital Sign    Days of Exercise per Week: 3 days    Minutes of Exercise per Session: 20 min  Stress: No Stress Concern Present (01/06/2024)   [employer] of Occupational Health - Occupational Stress Questionnaire    Feeling of Stress: Only a little  Social Connections: Socially Integrated (01/06/2024)   Social Connection and Isolation Panel    Frequency of Communication with Friends and Family: More than three times a week    Frequency of Social Gatherings with Friends and Family: Once a week    Attends Religious Services: More than 4 times per year    Active Member of [employer] or Organizations: Yes    Attends Engineer, structural: More than 4 times per year    Marital Status: Married  Catering manager Violence: Not on file   Family Status  Relation Name Status   Mother  Alive   Father  Alive   Neg Hx  (Not Specified)  No partnership data on file   Family History  Problem Relation Age of Onset   Hyperlipidemia Mother    Hypertension Mother    Depression Mother    Coronary artery disease Father     Diabetes Father    Heart disease Father    Hyperlipidemia Father    Stroke Father    Kidney disease Father    Colon cancer Neg Hx    Esophageal cancer Neg Hx    Colon polyps Neg Hx    Pancreatic cancer Neg Hx    Stomach cancer Neg Hx    Allergies  Allergen Reactions   Topiramate  Other (See Comments)    ha   Metformin  And Related Diarrhea      Review of Systems  Constitutional:  Negative for chills, fever and malaise/fatigue.  HENT:  Negative for congestion and hearing loss.   Eyes:  Negative for discharge.  Respiratory:  Negative for cough, sputum production and shortness of breath.   Cardiovascular:  Negative for chest pain, palpitations and leg swelling.  Gastrointestinal:  Negative for abdominal pain, blood in stool, constipation, diarrhea, heartburn, nausea and vomiting.  Genitourinary:  Negative for dysuria, frequency, hematuria and urgency.  Musculoskeletal:  Negative for back pain, falls and myalgias.  Skin:  Negative for rash.  Neurological:  Negative for dizziness, sensory change, loss of consciousness, weakness and headaches.  Endo/Heme/Allergies:  Negative for environmental allergies. Does not bruise/bleed easily.  Psychiatric/Behavioral:  Negative for depression and suicidal ideas. The patient is not nervous/anxious and does not have insomnia.       Objective:     BP 122/88 (BP Location: Left Arm, Patient Position: Sitting, Cuff Size: Large)   Pulse 73   Temp 98 F (36.7 C) (Oral)   Resp 18   Ht 5' 2 (1.575 m)   Wt 294 lb 3.2 oz (133.4 kg)   SpO2 96%   BMI 53.81 kg/m  BP Readings from Last 3 Encounters:  01/07/24 122/88  08/20/23 136/87  08/18/23 136/86   Wt Readings from Last 3 Encounters:  01/07/24 294 lb 3.2 oz (133.4 kg)  08/20/23 282 lb (127.9 kg)  08/18/23 278 lb (126.1 kg)   SpO2 Readings from Last 3 Encounters:  01/07/24 96%  08/20/23 98%  08/18/23 100%      Physical Exam Vitals and nursing note reviewed.  Constitutional:       General: She is not in acute distress.    Appearance: Normal appearance. She is well-developed.  HENT:     Head: Normocephalic and atraumatic.   Eyes:     General: No scleral icterus.       Right eye: No discharge.        Left eye: No discharge.    Cardiovascular:     Rate and Rhythm: Normal rate and regular rhythm.     Heart sounds: No murmur heard. Pulmonary:     Effort: Pulmonary effort is normal. No respiratory distress.     Breath sounds: Normal breath sounds.   Musculoskeletal:        General: Normal range of motion.     Cervical back: Normal range of motion and neck supple.     Right lower leg: No edema.     Left lower leg: No edema.   Skin:    General: Skin is warm and dry.     Findings: Rash present.       Neurological:     Mental Status: She is alert and oriented to person, place, and time.   Psychiatric:        Mood and Affect: Mood normal.        Behavior: Behavior normal.        Thought Content: Thought content normal.        Judgment: Judgment normal.      No results found for any visits on 01/07/24.  Last CBC Lab Results  Component Value Date   WBC 7.2 06/18/2023   HGB 12.0 06/18/2023   HCT 35.8 06/18/2023   MCV 90.4 06/18/2023   MCH 30.3 06/18/2023   RDW 12.7 06/18/2023   PLT 334 06/18/2023   Last metabolic panel Lab Results  Component Value Date   GLUCOSE 85 06/18/2023   NA 140 06/18/2023   K  3.7 06/18/2023   CL 101 06/18/2023   CO2 29 06/18/2023   BUN 18 06/18/2023   CREATININE 0.85 06/18/2023   EGFR 90 12/23/2022   CALCIUM  9.6 06/18/2023   PROT 7.1 06/18/2023   ALBUMIN 4.1 12/23/2022   LABGLOB 3.4 12/23/2022   AGRATIO 1.2 12/23/2022   BILITOT 0.3 06/18/2023   ALKPHOS 75 12/23/2022   AST 16 06/18/2023   ALT 15 06/18/2023   Last lipids Lab Results  Component Value Date   CHOL 222 (H) 06/18/2023   HDL 48 (L) 06/18/2023   LDLCALC 151 (H) 06/18/2023   TRIG 114 06/18/2023   CHOLHDL 4.6 06/18/2023   Last hemoglobin  A1c Lab Results  Component Value Date   HGBA1C 5.7 (H) 12/23/2022   Last thyroid  functions Lab Results  Component Value Date   TSH 1.520 12/23/2022   T3TOTAL 111 12/14/2017   Last vitamin D  Lab Results  Component Value Date   VD25OH 36.1 12/23/2022   Last vitamin B12 and Folate Lab Results  Component Value Date   VITAMINB12 849 12/23/2022   FOLATE >20.0 12/14/2017      The 10-year ASCVD risk score (Arnett DK, et al., 2019) is: 2.2%    Assessment & Plan:   Problem List Items Addressed This Visit       Unprioritized   Morbid obesity (HCC)   Relevant Orders   Lipid panel   CBC with Differential/Platelet   Comprehensive metabolic panel with GFR   Hemoglobin A1c   Microalbumin / creatinine urine ratio   Human immunodeficiency virus (HIV) disease (HCC)   Relevant Medications   terbinafine (LAMISIL) 250 MG tablet   clotrimazole-betamethasone (LOTRISONE) cream   Pre-diabetes   Relevant Orders   Comprehensive metabolic panel with GFR   Hemoglobin A1c   Microalbumin / creatinine urine ratio   Insulin , random   Essential hypertension   Relevant Orders   Lipid panel   CBC with Differential/Platelet   Tinea pedis of both feet - Primary   Lamisil every day x 2 weeks  Lostrisone cream sent  Return to office as needed       Relevant Medications   terbinafine (LAMISIL) 250 MG tablet   clotrimazole-betamethasone (LOTRISONE) cream   Other Relevant Orders   Lipid panel   CBC with Differential/Platelet   Comprehensive metabolic panel with GFR   Hemoglobin A1c   Microalbumin / creatinine urine ratio  Assessment and Plan Assessment & Plan Tinea Pedis   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 Symptoms worsen with heat and at night. Possible reinfection from her spouse is considered, and her HIV status may increase susceptibility to fungal infections. Previous oral antifungal treatment was effective, but  symptoms recurred. She seeks resolution before her trip to the [place]. Prescribe oral terbinafine 250 mg for two weeks and a topical antifungal cream. Advise her to report if symptoms do not improve. Consider a dermatology referral if symptoms persist.  HIV   She is HIV positive but currently undetectable. Regular monitoring of renal function and overall health is ongoing. Her HIV status may increase the risk of fungal infections such as tinea pedis.  Diabetes Mellitus   Her blood glucose levels are not currently monitored, increasing the risk for yeast infections. She was previously involved in a healthy weight and wellness program. Order blood work to assess current blood glucose levels.  General Health Maintenance   She recently underwent a mammogram and Pap smear.    No follow-ups  on file.    Reg Bircher R Lowne Chase, DO

## 2024-01-08 LAB — COMPREHENSIVE METABOLIC PANEL WITH GFR
ALT: 14 U/L (ref 0–35)
AST: 14 U/L (ref 0–37)
Albumin: 3.9 g/dL (ref 3.5–5.2)
Alkaline Phosphatase: 71 U/L (ref 39–117)
BUN: 15 mg/dL (ref 6–23)
CO2: 30 meq/L (ref 19–32)
Calcium: 9.6 mg/dL (ref 8.4–10.5)
Chloride: 99 meq/L (ref 96–112)
Creatinine, Ser: 0.85 mg/dL (ref 0.40–1.20)
GFR: 83.7 mL/min (ref >60.00)
Glucose, Bld: 87 mg/dL (ref 70–99)
Potassium: 3.7 meq/L (ref 3.5–5.1)
Sodium: 138 meq/L (ref 135–145)
Total Bilirubin: 0.3 mg/dL (ref 0.2–1.2)
Total Protein: 7.6 g/dL (ref 6.0–8.3)

## 2024-01-08 LAB — LIPID PANEL
Cholesterol: 221 mg/dL — ABNORMAL HIGH (ref 0–200)
HDL: 38.2 mg/dL — ABNORMAL LOW (ref >39.00)
LDL Cholesterol: 152 mg/dL — ABNORMAL HIGH (ref 0–99)
NonHDL: 182.4
Total CHOL/HDL Ratio: 6
Triglycerides: 153 mg/dL — ABNORMAL HIGH (ref 0.0–149.0)
VLDL: 30.6 mg/dL (ref 0.0–40.0)

## 2024-01-08 LAB — CBC WITH DIFFERENTIAL/PLATELET
Basophils Absolute: 0 10*3/uL (ref 0.0–0.1)
Basophils Relative: 0.6 % (ref 0.0–3.0)
Eosinophils Absolute: 0.1 10*3/uL (ref 0.0–0.7)
Eosinophils Relative: 1.2 % (ref 0.0–5.0)
HCT: 36.3 % (ref 36.0–46.0)
Hemoglobin: 12.1 g/dL (ref 12.0–15.0)
Lymphocytes Relative: 38.7 % (ref 12.0–46.0)
Lymphs Abs: 3.1 10*3/uL (ref 0.7–4.0)
MCHC: 33.4 g/dL (ref 30.0–36.0)
MCV: 89.3 fl (ref 78.0–100.0)
Monocytes Absolute: 0.6 10*3/uL (ref 0.1–1.0)
Monocytes Relative: 7.2 % (ref 3.0–12.0)
Neutro Abs: 4.2 10*3/uL (ref 1.4–7.7)
Neutrophils Relative %: 52.3 % (ref 43.0–77.0)
Platelets: 331 10*3/uL (ref 150.0–400.0)
RBC: 4.06 Mil/uL (ref 3.87–5.11)
RDW: 13.8 % (ref 11.5–15.5)
WBC: 8 10*3/uL (ref 4.0–10.5)

## 2024-01-08 LAB — MICROALBUMIN / CREATININE URINE RATIO
Creatinine,U: 127.3 mg/dL
Microalb Creat Ratio: 45.5 mg/g — ABNORMAL HIGH (ref 0.0–30.0)
Microalb, Ur: 5.8 mg/dL — ABNORMAL HIGH (ref 0.0–1.9)

## 2024-01-08 LAB — INSULIN, RANDOM: Insulin: 45.3 u[IU]/mL — ABNORMAL HIGH

## 2024-01-08 LAB — HEMOGLOBIN A1C: Hgb A1c MFr Bld: 6.1 % (ref 4.6–6.5)

## 2024-01-10 ENCOUNTER — Ambulatory Visit: Payer: Self-pay | Admitting: Family Medicine

## 2024-02-08 ENCOUNTER — Other Ambulatory Visit: Payer: Self-pay | Admitting: Family Medicine

## 2024-02-08 DIAGNOSIS — I10 Essential (primary) hypertension: Secondary | ICD-10-CM

## 2024-02-15 ENCOUNTER — Other Ambulatory Visit: Payer: Self-pay | Admitting: Internal Medicine

## 2024-02-15 ENCOUNTER — Other Ambulatory Visit: Payer: Self-pay | Admitting: Family Medicine

## 2024-02-15 DIAGNOSIS — B20 Human immunodeficiency virus [HIV] disease: Secondary | ICD-10-CM

## 2024-02-15 DIAGNOSIS — I10 Essential (primary) hypertension: Secondary | ICD-10-CM

## 2024-03-23 ENCOUNTER — Other Ambulatory Visit: Payer: Self-pay | Admitting: Family Medicine

## 2024-03-23 DIAGNOSIS — Z124 Encounter for screening for malignant neoplasm of cervix: Secondary | ICD-10-CM

## 2024-03-23 DIAGNOSIS — I10 Essential (primary) hypertension: Secondary | ICD-10-CM

## 2024-03-23 DIAGNOSIS — B353 Tinea pedis: Secondary | ICD-10-CM

## 2024-07-15 ENCOUNTER — Other Ambulatory Visit: Payer: Self-pay

## 2024-07-15 ENCOUNTER — Other Ambulatory Visit

## 2024-07-15 DIAGNOSIS — Z113 Encounter for screening for infections with a predominantly sexual mode of transmission: Secondary | ICD-10-CM

## 2024-07-15 DIAGNOSIS — B20 Human immunodeficiency virus [HIV] disease: Secondary | ICD-10-CM

## 2024-07-15 NOTE — Addendum Note (Signed)
 Addended by: GRETEL TULLY HERO on: 07/15/2024 11:05 AM   Modules accepted: Orders

## 2024-07-19 LAB — CBC WITH DIFFERENTIAL/PLATELET
Absolute Lymphocytes: 2463 {cells}/uL (ref 850–3900)
Absolute Monocytes: 269 {cells}/uL (ref 200–950)
Basophils Absolute: 41 {cells}/uL (ref 0–200)
Basophils Relative: 0.6 %
Eosinophils Absolute: 131 {cells}/uL (ref 15–500)
Eosinophils Relative: 1.9 %
HCT: 38.6 % (ref 35.9–46.0)
Hemoglobin: 12.5 g/dL (ref 11.7–15.5)
MCH: 29.1 pg (ref 27.0–33.0)
MCHC: 32.4 g/dL (ref 31.6–35.4)
MCV: 89.8 fL (ref 81.4–101.7)
MPV: 10.4 fL (ref 7.5–12.5)
Monocytes Relative: 3.9 %
Neutro Abs: 3995 {cells}/uL (ref 1500–7800)
Neutrophils Relative %: 57.9 %
Platelets: 358 Thousand/uL (ref 140–400)
RBC: 4.3 Million/uL (ref 3.80–5.10)
RDW: 12.5 % (ref 11.0–15.0)
Total Lymphocyte: 35.7 %
WBC: 6.9 Thousand/uL (ref 3.8–10.8)

## 2024-07-19 LAB — COMPLETE METABOLIC PANEL WITHOUT GFR
AG Ratio: 1.1 (calc) (ref 1.0–2.5)
ALT: 15 U/L (ref 6–29)
AST: 14 U/L (ref 10–30)
Albumin: 3.8 g/dL (ref 3.6–5.1)
Alkaline phosphatase (APISO): 83 U/L (ref 31–125)
BUN: 16 mg/dL (ref 7–25)
CO2: 31 mmol/L (ref 20–32)
Calcium: 9.6 mg/dL (ref 8.6–10.2)
Chloride: 100 mmol/L (ref 98–110)
Creat: 0.75 mg/dL (ref 0.50–0.99)
Globulin: 3.4 g/dL (ref 1.9–3.7)
Glucose, Bld: 149 mg/dL — ABNORMAL HIGH (ref 65–99)
Potassium: 3.6 mmol/L (ref 3.5–5.3)
Sodium: 139 mmol/L (ref 135–146)
Total Bilirubin: 0.4 mg/dL (ref 0.2–1.2)
Total Protein: 7.2 g/dL (ref 6.1–8.1)

## 2024-07-19 LAB — T-HELPER CELLS (CD4) COUNT (NOT AT ARMC)
Absolute CD4: 980 {cells}/uL (ref 490–1740)
CD4 T Helper %: 39 % (ref 30–61)
Total lymphocyte count: 2516 {cells}/uL (ref 850–3900)

## 2024-07-19 LAB — HIV-1 RNA QUANT-NO REFLEX-BLD
HIV 1 RNA Quant: <20 {copies}/mL — AB
HIV-1 RNA Quant, Log: <1.3 {Log_copies}/mL — AB

## 2024-07-19 LAB — C. TRACHOMATIS/N. GONORRHOEAE RNA
C. trachomatis RNA, TMA: NOT DETECTED
N. gonorrhoeae RNA, TMA: NOT DETECTED

## 2024-07-19 LAB — SYPHILIS: RPR W/REFLEX TO RPR TITER AND TREPONEMAL ANTIBODIES, TRADITIONAL SCREENING AND DIAGNOSIS ALGORITHM: RPR Ser Ql: NONREACTIVE

## 2024-08-08 ENCOUNTER — Encounter: Payer: Self-pay | Admitting: Internal Medicine

## 2024-08-08 ENCOUNTER — Telehealth: Payer: Self-pay | Admitting: Internal Medicine

## 2024-08-08 DIAGNOSIS — B20 Human immunodeficiency virus [HIV] disease: Secondary | ICD-10-CM

## 2024-08-08 MED ORDER — SYMTUZA 800-150-200-10 MG PO TABS: 1.0000 | ORAL_TABLET | Freq: Every day | ORAL | 11 refills | Status: AC

## 2024-08-08 NOTE — Progress Notes (Signed)
 Virtual Visit via Telephone/Video Note   I connected with Autumn Curry   On 08/17/2024 at 11:18 AM  by Video and verified that I am speaking with the correct person using two identifiers.   I discussed the limitations, risks, security and privacy concerns of performing an evaluation and management service by telephone and the availability of in person appointments. I also discussed with the patient that there may be a patient responsible charge related to this service. The patient expressed understanding and agreed to proceed.   Location:   Patient: Home Provider: [facility]   HPI: Autumn Curry is a 44 y.o. female presents for HIV management. On symtuza . Rash on left foot.   Past Medical History:  Diagnosis Date   Absence of menstruation    Carbuncle and furuncle of unspecified site    Elevated blood pressure reading without diagnosis of hypertension    Encounter for long-term (current) use of other medications    Headache(784.0)    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   Hyperlipidemia    Hypertension    Migraine    Prediabetes    Routine general medical examination at a health care facility    Screening for malignant neoplasm of the cervix    Syphilis, unspecified     Past Surgical History:  Procedure Laterality Date   NO PAST SURGERIES      Family History  Problem Relation Age of Onset   Hyperlipidemia Mother    Hypertension Mother    Depression Mother    Coronary artery disease Father    Diabetes Father    Heart disease Father    Hyperlipidemia Father    Stroke Father    Kidney disease Father    Colon cancer Neg Hx    Esophageal cancer Neg Hx    Colon polyps Neg Hx    Pancreatic cancer Neg Hx    Stomach cancer Neg Hx    Medications Ordered Prior to Encounter[1]  Allergies[2]    Lab Results HIV 1 RNA Quant  Date Value  07/15/2024 <20 DETECTED copies/mL (A)  06/18/2023 Not Detected Copies/mL  06/24/2022 Not Detected Copies/mL   CD4 T Cell  Abs (/uL)  Date Value  06/24/2022 1,070  05/07/2020 1,053  04/26/2019 1,111   No results found for: HIV1GENOSEQ Lab Results  Component Value Date   WBC 6.9 07/15/2024   HGB 12.5 07/15/2024   HCT 38.6 07/15/2024   MCV 89.8 07/15/2024   PLT 358 07/15/2024    Lab Results  Component Value Date   CREATININE 0.75 07/15/2024   BUN 16 07/15/2024   NA 139 07/15/2024   K 3.6 07/15/2024   CL 100 07/15/2024   CO2 31 07/15/2024   Lab Results  Component Value Date   ALT 15 07/15/2024   AST 14 07/15/2024   ALKPHOS 71 01/07/2024   BILITOT 0.4 07/15/2024    Lab Results  Component Value Date   CHOL 221 (H) 01/07/2024   TRIG 153.0 (H) 01/07/2024   HDL 38.20 (L) 01/07/2024   LDLCALC 152 (H) 01/07/2024   Lab Results  Component Value Date   HAV NON-REACTIVE 08/20/2023   Lab Results  Component Value Date   HEPBSAG NON-REACTIVE 08/20/2023   HEPBSAB NON-REACTIVE 08/20/2023   Lab Results  Component Value Date   HCVAB Negative 09/11/2006   Lab Results  Component Value Date   CHLAMYDIAWP Negative 08/20/2023   N Negative 08/20/2023   No results found for: GCPROBEAPT No results  found for: QUANTGOLD  Assessment/Plan #HIV/Asymptomatic -[id number] on 12/23, VL ND, on 12/24 on Symtuza  -HIV Labs today -F/U in one year    #referral to dermatology -Notes a dark rash on both feet that comes and goes for about a year. Has not noticed a relationship with any topical agents. Left foot is worse than tight   #Vaccination COVID Flu today 08/20/23 PCV 23 /n/ 05/26/2019 Meningitis HepA-serology HEpB-serology(imm x 3 dose) Tdap 2017 Shingles   #Health maintenance -Quantiferon today 08/20/23 -RPR NR 06/18/23 -HCV today 08/20/23 -GCtoday 08/20/23 -Lipid  The 10-year ASCVD risk score (Arnett DK, et al., 2019) is:  Age dependent: - Mammogram:  The 10-year ASCVD risk score (Arnett DK, et al., 2019) is: 3.8%   Values used to calculate the score:     Age: 39 years     Clinically  relevant sex: Female     Is Non-Hispanic African American: Yes     Diabetic: No     Tobacco smoker: No     Systolic Blood Pressure: 122 mmHg     Is BP treated: Yes     HDL Cholesterol: 38.2 mg/dL     Total Cholesterol: 221 mg/dL   ' Colonoscopy: Discuss at next visit     Loney Stank, MD [facility] I personally spent a total of 45 minutes in the care of the patient today including preparing to see the patient, getting/reviewing separately obtained history, performing a medically appropriate exam/evaluation, counseling and educating, placing orders, independently interpreting results, and communicating results.     [1]  Current Outpatient Medications on File Prior to Visit  Medication Sig Dispense Refill   aspirin-acetaminophen -caffeine (EXCEDRIN MIGRAINE) 250-250-65 MG tablet Take by mouth every 6 (six) hours as needed for headache.     atenolol  (TENORMIN ) 25 MG tablet Take 1 tablet (25 mg total) by mouth daily. 90 tablet 1   clotrimazole -betamethasone  (LOTRISONE ) cream APPLY 1 APPLICATION TOPICALLY ONCE DAILY 30 g 0   eletriptan  (RELPAX ) 20 MG tablet Take 1 tablet (20 mg total) by mouth as needed for migraine or headache. One tablet by mouth at onset of headache. May repeat in 2 hours if headache persists or recurs. (Patient not taking: Reported on 01/07/2024) 10 tablet 1   hydrochlorothiazide  (HYDRODIURIL ) 25 MG tablet Take 1 tablet (25 mg total) by mouth daily. 90 tablet 1   loratadine (CLARITIN) 10 MG tablet Take 10 mg by mouth daily.     Multiple Vitamins-Minerals (ALIVE WOMENS ENERGY) TABS Take 1 tablet by mouth daily.     NONFORMULARY OR COMPOUNDED ITEM Compression socks  20-30 mm/hg---as directed for edema 2 each 1   nystatin -triamcinolone  ointment (MYCOLOG) Apply topically 2 (two) times daily. (Patient not taking: Reported on 01/07/2024) 30 g 0   phentermine  (ADIPEX-P ) 37.5 MG tablet Take 0.5 tablets (18.75 mg total) by  mouth daily before breakfast. (Patient not taking: Reported on 01/07/2024) 15 tablet 0   SYMTUZA  800-150-200-10 MG TABS Take 1 tablet by mouth once daily with breakfast 30 tablet 6   terbinafine  (LAMISIL ) 250 MG tablet Take 1 tablet (250 mg total) by mouth daily. 14 tablet 0   valACYclovir  (VALTREX ) 500 MG tablet Take 1 tablet (500 mg total) by mouth 2 (two) times daily. 10 tablet 5   No current facility-administered medications on file prior to visit.  [2]  Allergies Allergen Reactions   Topiramate  Other (See Comments)    ha   Metformin  And Related Diarrhea

## 2024-08-08 NOTE — Patient Instructions (Signed)
 Lab visit appt two weeks prior to next follow up in one year

## 2024-08-09 ENCOUNTER — Other Ambulatory Visit: Payer: Self-pay | Admitting: Family Medicine

## 2024-08-09 DIAGNOSIS — I10 Essential (primary) hypertension: Secondary | ICD-10-CM
